# Patient Record
Sex: Female | Born: 1950
Health system: Southern US, Community
[De-identification: ages and names within clinical notes are randomized; demographics above are authoritative.]

## PROBLEM LIST (undated history)

## (undated) DIAGNOSIS — I10 Essential (primary) hypertension: Secondary | ICD-10-CM

## (undated) DIAGNOSIS — M869 Osteomyelitis, unspecified: Secondary | ICD-10-CM

## (undated) DIAGNOSIS — E11610 Type 2 diabetes mellitus with diabetic neuropathic arthropathy: Secondary | ICD-10-CM

## (undated) DIAGNOSIS — E113599 Type 2 diabetes mellitus with proliferative diabetic retinopathy without macular edema, unspecified eye: Secondary | ICD-10-CM

## (undated) DIAGNOSIS — L03116 Cellulitis of left lower limb: Secondary | ICD-10-CM

## (undated) DIAGNOSIS — K759 Inflammatory liver disease, unspecified: Secondary | ICD-10-CM

## (undated) DIAGNOSIS — E119 Type 2 diabetes mellitus without complications: Secondary | ICD-10-CM

## (undated) HISTORY — PX: TOE AMPUTATION: SHX809

## (undated) HISTORY — PX: CATARACT EXTRACTION W/ INTRAOCULAR LENS  IMPLANT, BILATERAL: SHX1307

## (undated) HISTORY — PX: FOOT SURGERY: SHX648

---

## 1971-12-23 HISTORY — PX: TUBAL LIGATION: SHX77

## 1985-12-22 HISTORY — PX: REDUCTION MAMMAPLASTY: SUR839

## 2002-12-22 HISTORY — PX: ABOVE KNEE LEG AMPUTATION: SUR20

## 2014-05-30 DIAGNOSIS — E7800 Pure hypercholesterolemia, unspecified: Secondary | ICD-10-CM | POA: Diagnosis not present

## 2014-05-30 DIAGNOSIS — E119 Type 2 diabetes mellitus without complications: Secondary | ICD-10-CM | POA: Diagnosis not present

## 2014-05-30 DIAGNOSIS — I10 Essential (primary) hypertension: Secondary | ICD-10-CM | POA: Diagnosis not present

## 2014-06-05 DIAGNOSIS — Z23 Encounter for immunization: Secondary | ICD-10-CM | POA: Diagnosis not present

## 2014-07-07 DIAGNOSIS — E1165 Type 2 diabetes mellitus with hyperglycemia: Secondary | ICD-10-CM | POA: Diagnosis not present

## 2014-07-07 DIAGNOSIS — E1139 Type 2 diabetes mellitus with other diabetic ophthalmic complication: Secondary | ICD-10-CM | POA: Diagnosis not present

## 2014-07-11 DIAGNOSIS — E1149 Type 2 diabetes mellitus with other diabetic neurological complication: Secondary | ICD-10-CM | POA: Diagnosis not present

## 2014-07-11 DIAGNOSIS — S88919A Complete traumatic amputation of unspecified lower leg, level unspecified, initial encounter: Secondary | ICD-10-CM | POA: Diagnosis not present

## 2014-07-11 DIAGNOSIS — I10 Essential (primary) hypertension: Secondary | ICD-10-CM | POA: Diagnosis not present

## 2014-07-11 DIAGNOSIS — E1142 Type 2 diabetes mellitus with diabetic polyneuropathy: Secondary | ICD-10-CM | POA: Diagnosis not present

## 2014-08-22 DIAGNOSIS — I10 Essential (primary) hypertension: Secondary | ICD-10-CM | POA: Diagnosis not present

## 2014-08-22 DIAGNOSIS — E7800 Pure hypercholesterolemia, unspecified: Secondary | ICD-10-CM | POA: Diagnosis not present

## 2014-08-22 DIAGNOSIS — E119 Type 2 diabetes mellitus without complications: Secondary | ICD-10-CM | POA: Diagnosis not present

## 2014-10-10 DIAGNOSIS — H18419 Arcus senilis, unspecified eye: Secondary | ICD-10-CM | POA: Diagnosis not present

## 2014-10-10 DIAGNOSIS — H11159 Pinguecula, unspecified eye: Secondary | ICD-10-CM | POA: Diagnosis not present

## 2014-10-10 DIAGNOSIS — H02409 Unspecified ptosis of unspecified eyelid: Secondary | ICD-10-CM | POA: Diagnosis not present

## 2014-10-10 DIAGNOSIS — E10319 Type 1 diabetes mellitus with unspecified diabetic retinopathy without macular edema: Secondary | ICD-10-CM | POA: Diagnosis not present

## 2014-10-18 DIAGNOSIS — Z899 Acquired absence of limb, unspecified: Secondary | ICD-10-CM | POA: Diagnosis not present

## 2014-10-18 DIAGNOSIS — I10 Essential (primary) hypertension: Secondary | ICD-10-CM | POA: Diagnosis not present

## 2014-10-18 DIAGNOSIS — E1140 Type 2 diabetes mellitus with diabetic neuropathy, unspecified: Secondary | ICD-10-CM | POA: Diagnosis not present

## 2014-10-18 DIAGNOSIS — E780 Pure hypercholesterolemia: Secondary | ICD-10-CM | POA: Diagnosis not present

## 2014-10-24 ENCOUNTER — Encounter (HOSPITAL_COMMUNITY): Payer: Self-pay | Admitting: Internal Medicine

## 2014-10-24 ENCOUNTER — Inpatient Hospital Stay (HOSPITAL_COMMUNITY)
Admission: AD | Admit: 2014-10-24 | Discharge: 2014-11-02 | DRG: 617 | Disposition: A | Discharge status: Skilled Nursing Facility | Payer: Medicare HMO | Source: Ambulatory Visit | Attending: Internal Medicine | Admitting: Internal Medicine

## 2014-10-24 ENCOUNTER — Inpatient Hospital Stay (HOSPITAL_COMMUNITY): Payer: Medicare HMO

## 2014-10-24 DIAGNOSIS — Z89511 Acquired absence of right leg below knee: Secondary | ICD-10-CM | POA: Diagnosis not present

## 2014-10-24 DIAGNOSIS — L0390 Cellulitis, unspecified: Secondary | ICD-10-CM

## 2014-10-24 DIAGNOSIS — L03116 Cellulitis of left lower limb: Secondary | ICD-10-CM | POA: Diagnosis not present

## 2014-10-24 DIAGNOSIS — E1143 Type 2 diabetes mellitus with diabetic autonomic (poly)neuropathy: Secondary | ICD-10-CM | POA: Diagnosis present

## 2014-10-24 DIAGNOSIS — L97422 Non-pressure chronic ulcer of left heel and midfoot with fat layer exposed: Secondary | ICD-10-CM | POA: Diagnosis present

## 2014-10-24 DIAGNOSIS — M79673 Pain in unspecified foot: Secondary | ICD-10-CM | POA: Diagnosis not present

## 2014-10-24 DIAGNOSIS — N179 Acute kidney failure, unspecified: Secondary | ICD-10-CM | POA: Diagnosis not present

## 2014-10-24 DIAGNOSIS — R109 Unspecified abdominal pain: Secondary | ICD-10-CM | POA: Diagnosis not present

## 2014-10-24 DIAGNOSIS — Z87891 Personal history of nicotine dependence: Secondary | ICD-10-CM

## 2014-10-24 DIAGNOSIS — S96009A Unspecified injury of muscle and tendon of long flexor muscle of toe at ankle and foot level, unspecified foot, initial encounter: Secondary | ICD-10-CM | POA: Diagnosis not present

## 2014-10-24 DIAGNOSIS — R112 Nausea with vomiting, unspecified: Secondary | ICD-10-CM | POA: Diagnosis not present

## 2014-10-24 DIAGNOSIS — E0840 Diabetes mellitus due to underlying condition with diabetic neuropathy, unspecified: Secondary | ICD-10-CM | POA: Diagnosis not present

## 2014-10-24 DIAGNOSIS — L02619 Cutaneous abscess of unspecified foot: Secondary | ICD-10-CM | POA: Diagnosis not present

## 2014-10-24 DIAGNOSIS — E0869 Diabetes mellitus due to underlying condition with other specified complication: Secondary | ICD-10-CM

## 2014-10-24 DIAGNOSIS — Z89611 Acquired absence of right leg above knee: Secondary | ICD-10-CM

## 2014-10-24 DIAGNOSIS — R1110 Vomiting, unspecified: Secondary | ICD-10-CM | POA: Diagnosis not present

## 2014-10-24 DIAGNOSIS — M86179 Other acute osteomyelitis, unspecified ankle and foot: Secondary | ICD-10-CM | POA: Diagnosis not present

## 2014-10-24 DIAGNOSIS — Z899 Acquired absence of limb, unspecified: Secondary | ICD-10-CM | POA: Diagnosis not present

## 2014-10-24 DIAGNOSIS — Z79899 Other long term (current) drug therapy: Secondary | ICD-10-CM | POA: Diagnosis not present

## 2014-10-24 DIAGNOSIS — IMO0001 Reserved for inherently not codable concepts without codable children: Secondary | ICD-10-CM

## 2014-10-24 DIAGNOSIS — K219 Gastro-esophageal reflux disease without esophagitis: Secondary | ICD-10-CM | POA: Diagnosis present

## 2014-10-24 DIAGNOSIS — E11621 Type 2 diabetes mellitus with foot ulcer: Principal | ICD-10-CM | POA: Diagnosis present

## 2014-10-24 DIAGNOSIS — Z794 Long term (current) use of insulin: Secondary | ICD-10-CM

## 2014-10-24 DIAGNOSIS — K3184 Gastroparesis: Secondary | ICD-10-CM | POA: Diagnosis present

## 2014-10-24 DIAGNOSIS — I10 Essential (primary) hypertension: Secondary | ICD-10-CM | POA: Diagnosis present

## 2014-10-24 DIAGNOSIS — E118 Type 2 diabetes mellitus with unspecified complications: Secondary | ICD-10-CM | POA: Insufficient documentation

## 2014-10-24 DIAGNOSIS — E11649 Type 2 diabetes mellitus with hypoglycemia without coma: Secondary | ICD-10-CM | POA: Diagnosis not present

## 2014-10-24 DIAGNOSIS — K5900 Constipation, unspecified: Secondary | ICD-10-CM | POA: Diagnosis not present

## 2014-10-24 DIAGNOSIS — M86672 Other chronic osteomyelitis, left ankle and foot: Secondary | ICD-10-CM | POA: Diagnosis present

## 2014-10-24 DIAGNOSIS — E119 Type 2 diabetes mellitus without complications: Secondary | ICD-10-CM | POA: Diagnosis present

## 2014-10-24 DIAGNOSIS — E780 Pure hypercholesterolemia: Secondary | ICD-10-CM | POA: Diagnosis not present

## 2014-10-24 DIAGNOSIS — E1142 Type 2 diabetes mellitus with diabetic polyneuropathy: Secondary | ICD-10-CM | POA: Diagnosis present

## 2014-10-24 DIAGNOSIS — E11610 Type 2 diabetes mellitus with diabetic neuropathic arthropathy: Secondary | ICD-10-CM

## 2014-10-24 DIAGNOSIS — L97529 Non-pressure chronic ulcer of other part of left foot with unspecified severity: Secondary | ICD-10-CM | POA: Diagnosis not present

## 2014-10-24 DIAGNOSIS — E1159 Type 2 diabetes mellitus with other circulatory complications: Secondary | ICD-10-CM | POA: Diagnosis present

## 2014-10-24 DIAGNOSIS — E0951 Drug or chemical induced diabetes mellitus with diabetic peripheral angiopathy without gangrene: Secondary | ICD-10-CM | POA: Diagnosis not present

## 2014-10-24 DIAGNOSIS — E1140 Type 2 diabetes mellitus with diabetic neuropathy, unspecified: Secondary | ICD-10-CM | POA: Diagnosis not present

## 2014-10-24 DIAGNOSIS — L03818 Cellulitis of other sites: Secondary | ICD-10-CM

## 2014-10-24 HISTORY — DX: Cellulitis of left lower limb: L03.116

## 2014-10-24 HISTORY — DX: Osteomyelitis, unspecified: M86.9

## 2014-10-24 HISTORY — DX: Type 2 diabetes mellitus with diabetic neuropathic arthropathy: E11.610

## 2014-10-24 HISTORY — DX: Type 2 diabetes mellitus without complications: E11.9

## 2014-10-24 LAB — COMPREHENSIVE METABOLIC PANEL
ALBUMIN: 2.8 g/dL — AB (ref 3.5–5.2)
AST: 9 U/L (ref 0–37)
Alkaline Phosphatase: 150 U/L — ABNORMAL HIGH (ref 39–117)
Anion gap: 17 — ABNORMAL HIGH (ref 5–15)
BUN: 17 mg/dL (ref 6–23)
CO2: 22 mEq/L (ref 19–32)
Calcium: 9.1 mg/dL (ref 8.4–10.5)
Chloride: 92 mEq/L — ABNORMAL LOW (ref 96–112)
Creatinine, Ser: 0.84 mg/dL (ref 0.50–1.10)
GFR calc Af Amer: 83 mL/min — ABNORMAL LOW (ref >90)
GFR calc non Af Amer: 71 mL/min — ABNORMAL LOW (ref >90)
Glucose, Bld: 440 mg/dL — ABNORMAL HIGH (ref 70–99)
POTASSIUM: 4.6 meq/L (ref 3.7–5.3)
SODIUM: 131 meq/L — AB (ref 137–147)
TOTAL PROTEIN: 7.5 g/dL (ref 6.0–8.3)
Total Bilirubin: 0.6 mg/dL (ref 0.3–1.2)

## 2014-10-24 LAB — CBC WITH DIFFERENTIAL/PLATELET
BASOS PCT: 0 % (ref 0–1)
Basophils Absolute: 0 10*3/uL (ref 0.0–0.1)
Eosinophils Absolute: 0 10*3/uL (ref 0.0–0.7)
Eosinophils Relative: 0 % (ref 0–5)
HEMATOCRIT: 36.8 % (ref 36.0–46.0)
HEMOGLOBIN: 12.3 g/dL (ref 12.0–15.0)
LYMPHS ABS: 1 10*3/uL (ref 0.7–4.0)
Lymphocytes Relative: 7 % — ABNORMAL LOW (ref 12–46)
MCH: 28 pg (ref 26.0–34.0)
MCHC: 33.4 g/dL (ref 30.0–36.0)
MCV: 83.6 fL (ref 78.0–100.0)
MONO ABS: 1.9 10*3/uL — AB (ref 0.1–1.0)
MONOS PCT: 13 % — AB (ref 3–12)
NEUTROS ABS: 12.1 10*3/uL — AB (ref 1.7–7.7)
Neutrophils Relative %: 80 % — ABNORMAL HIGH (ref 43–77)
Platelets: 319 10*3/uL (ref 150–400)
RBC: 4.4 MIL/uL (ref 3.87–5.11)
RDW: 12.5 % (ref 11.5–15.5)
WBC: 15.1 10*3/uL — ABNORMAL HIGH (ref 4.0–10.5)

## 2014-10-24 LAB — GLUCOSE, CAPILLARY
GLUCOSE-CAPILLARY: 236 mg/dL — AB (ref 70–99)
Glucose-Capillary: 374 mg/dL — ABNORMAL HIGH (ref 70–99)

## 2014-10-24 LAB — HEMOGLOBIN A1C
Hgb A1c MFr Bld: 8 % — ABNORMAL HIGH (ref <5.7)
Mean Plasma Glucose: 183 mg/dL — ABNORMAL HIGH (ref <117)

## 2014-10-24 MED ORDER — ONDANSETRON HCL 4 MG/2ML IJ SOLN
4.0000 mg | Freq: Four times a day (QID) | INTRAMUSCULAR | Status: DC | PRN
  Administered 2014-10-28 – 2014-10-30 (×4): 4 mg via INTRAVENOUS
  Filled 2014-10-24 (×2): qty 2

## 2014-10-24 MED ORDER — INSULIN GLARGINE 100 UNIT/ML ~~LOC~~ SOLN
30.0000 [IU] | Freq: Every day | SUBCUTANEOUS | Status: DC
  Administered 2014-10-24: 30 [IU] via SUBCUTANEOUS
  Filled 2014-10-24 (×2): qty 0.3

## 2014-10-24 MED ORDER — VANCOMYCIN HCL IN DEXTROSE 1-5 GM/200ML-% IV SOLN
1000.0000 mg | Freq: Once | INTRAVENOUS | Status: AC
  Administered 2014-10-24: 1000 mg via INTRAVENOUS
  Filled 2014-10-24: qty 200

## 2014-10-24 MED ORDER — VANCOMYCIN HCL IN DEXTROSE 1-5 GM/200ML-% IV SOLN
1000.0000 mg | Freq: Two times a day (BID) | INTRAVENOUS | Status: DC
  Administered 2014-10-25 – 2014-10-29 (×9): 1000 mg via INTRAVENOUS
  Filled 2014-10-24 (×12): qty 200

## 2014-10-24 MED ORDER — PNEUMOCOCCAL VAC POLYVALENT 25 MCG/0.5ML IJ INJ
0.5000 mL | INJECTION | INTRAMUSCULAR | Status: AC
  Administered 2014-10-26: 0.5 mL via INTRAMUSCULAR
  Filled 2014-10-24: qty 0.5

## 2014-10-24 MED ORDER — ENOXAPARIN SODIUM 40 MG/0.4ML ~~LOC~~ SOLN
40.0000 mg | SUBCUTANEOUS | Status: DC
  Administered 2014-10-24 – 2014-10-27 (×4): 40 mg via SUBCUTANEOUS
  Filled 2014-10-24 (×5): qty 0.4

## 2014-10-24 MED ORDER — PIPERACILLIN-TAZOBACTAM 3.375 G IVPB
3.3750 g | Freq: Three times a day (TID) | INTRAVENOUS | Status: DC
  Administered 2014-10-25 – 2014-10-29 (×15): 3.375 g via INTRAVENOUS
  Filled 2014-10-24 (×18): qty 50

## 2014-10-24 MED ORDER — ACETAMINOPHEN 325 MG PO TABS
650.0000 mg | ORAL_TABLET | Freq: Four times a day (QID) | ORAL | Status: DC | PRN
  Administered 2014-10-24 – 2014-10-29 (×4): 650 mg via ORAL
  Filled 2014-10-24 (×3): qty 2

## 2014-10-24 MED ORDER — INSULIN ASPART 100 UNIT/ML ~~LOC~~ SOLN
0.0000 [IU] | Freq: Every day | SUBCUTANEOUS | Status: DC
  Administered 2014-10-24 – 2014-10-25 (×2): 2 [IU] via SUBCUTANEOUS
  Administered 2014-10-26 – 2014-10-27 (×2): 3 [IU] via SUBCUTANEOUS
  Administered 2014-10-28 – 2014-10-29 (×2): 2 [IU] via SUBCUTANEOUS

## 2014-10-24 MED ORDER — ACETAMINOPHEN 650 MG RE SUPP
650.0000 mg | Freq: Four times a day (QID) | RECTAL | Status: DC | PRN
Start: 2014-10-24 — End: 2014-11-02

## 2014-10-24 MED ORDER — SODIUM CHLORIDE 0.9 % IV SOLN
INTRAVENOUS | Status: DC
  Administered 2014-10-24 – 2014-10-28 (×6): via INTRAVENOUS

## 2014-10-24 MED ORDER — PIPERACILLIN-TAZOBACTAM 3.375 G IVPB 30 MIN
3.3750 g | Freq: Once | INTRAVENOUS | Status: AC
  Administered 2014-10-24: 3.375 g via INTRAVENOUS
  Filled 2014-10-24: qty 50

## 2014-10-24 MED ORDER — INSULIN GLARGINE 300 UNIT/ML ~~LOC~~ SOPN: 30.0000 [IU] | PEN_INJECTOR | Freq: Every day | SUBCUTANEOUS | Status: DC

## 2014-10-24 MED ORDER — SODIUM CHLORIDE 0.9 % IV BOLUS (SEPSIS)
500.0000 mL | Freq: Once | INTRAVENOUS | Status: AC
  Administered 2014-10-24: 500 mL via INTRAVENOUS

## 2014-10-24 MED ORDER — FAMOTIDINE 20 MG PO TABS
20.0000 mg | ORAL_TABLET | Freq: Every day | ORAL | Status: DC
  Administered 2014-10-24 – 2014-11-02 (×7): 20 mg via ORAL
  Filled 2014-10-24 (×11): qty 1

## 2014-10-24 MED ORDER — ONDANSETRON HCL 4 MG PO TABS: 4.0000 mg | ORAL_TABLET | Freq: Four times a day (QID) | ORAL | Status: DC | PRN

## 2014-10-24 MED ORDER — INSULIN ASPART 100 UNIT/ML ~~LOC~~ SOLN
0.0000 [IU] | Freq: Three times a day (TID) | SUBCUTANEOUS | Status: DC
  Administered 2014-10-24: 15 [IU] via SUBCUTANEOUS
  Administered 2014-10-25 (×3): 5 [IU] via SUBCUTANEOUS
  Administered 2014-10-26: 3 [IU] via SUBCUTANEOUS
  Administered 2014-10-26 (×2): 5 [IU] via SUBCUTANEOUS
  Administered 2014-10-27 (×2): 3 [IU] via SUBCUTANEOUS
  Administered 2014-10-28: 2 [IU] via SUBCUTANEOUS
  Administered 2014-10-28: 3 [IU] via SUBCUTANEOUS
  Administered 2014-10-29: 2 [IU] via SUBCUTANEOUS
  Administered 2014-10-31: 5 [IU] via SUBCUTANEOUS

## 2014-10-24 NOTE — Progress Notes (Signed)
Patient arrived as direct admit. Attending on filed paged for orders, awaiting response. Will continue to monitor patient

## 2014-10-24 NOTE — Plan of Care (Signed)
Problem: Phase I Progression Outcomes Goal: Pain controlled with appropriate interventions Outcome: Progressing     

## 2014-10-24 NOTE — Progress Notes (Signed)
ANTIBIOTIC CONSULT NOTE - INITIAL  Pharmacy Consult:  Vancomycin / Zosyn Indication:  Cellulitis  Allergies  Allergen Reactions  . Clindamycin/Lincomycin Itching  . Codeine Itching    Patient Measurements: Height: 5\' 7"  (170.2 cm) Weight: 183 lb (83.008 kg) IBW/kg (Calculated) : 61.6  Vital Signs: Temp: 98.4 F (36.9 C) (11/03 1152) Temp Source: Oral (11/03 1152) BP: 133/68 mmHg (11/03 1152) Pulse Rate: 102 (11/03 1152)  Labs: No results for input(s): WBC, HGB, PLT, LABCREA, CREATININE in the last 72 hours. CrCl cannot be calculated (Patient has no serum creatinine result on file.). No results for input(s): VANCOTROUGH, VANCOPEAK, VANCORANDOM, GENTTROUGH, GENTPEAK, GENTRANDOM, TOBRATROUGH, TOBRAPEAK, TOBRARND, AMIKACINPEAK, AMIKACINTROU, AMIKACIN in the last 72 hours.   Microbiology: No results found for this or any previous visit (from the past 720 hour(s)).  Medical History: Past Medical History  Diagnosis Date  . Diabetes   . Charcot foot due to diabetes mellitus       Assessment: 31 YOF with history of right BKA due to osteomyelitis.  Pharmacy consulted to manage vancomycin and Zosyn for cellulitis.  First doses of antibiotics already ordered.  Baseline labs reviewed.   Goal of Therapy:  Vancomycin trough level 10-15 mcg/ml   Plan:  - Vanc 1gm IV Q12H - Zosyn 3.375gm IV Q8H, 4 hr infusion - Monitor renal fxn, clinical progress, vanc trough as indicated    Diana Coleman D. Mina Marble, PharmD, BCPS Pager:  [phone] 10/24/2014, 3:16 PM

## 2014-10-24 NOTE — H&P (Signed)
[facility] History and Physical  Diana Coleman ZOX:[id number] DOB: 27-Jan-1951 DOA: 10/24/2014  Referring physician: [facility] PCP: Elyn Peers, MD   Chief Complaint: ulcer on foot  HPI: Diana Coleman is a 63 y.o. female  Who moved from [place].  She is a diabetic that is currently better controlled then she was in the past.  She had a right BKA in 2004 after osteomyelitis of the foot. She presented to her doctors office after developing a wound on her left lateral foot (2x2).  Her appetite has been decreased, she has had fever.  About 1 week ago she got a new shoe for her foot deformed by charcot disease.   Patient has decreased sensation in her foot and LE.  Does not check for wounds daily.    Her PCP called to get her admitted to the hospital for further work up Labs/imaging studies pending  Review of Systems:  All systems reviewed, negative unless stated above   Past Medical History  Diagnosis Date  . Diabetes   . Charcot foot due to diabetes mellitus    Past Surgical History  Procedure Laterality Date  . Right aka    . Tubal ligation     Social History:  reports that she has quit smoking. She does not have any smokeless tobacco history on file. She reports that she does not drink alcohol or use illicit drugs.  Allergies  Allergen Reactions  . Clindamycin/Lincomycin Itching  . Codeine Itching    Family History  Problem Relation Age of Onset  . Uterine cancer Mother   . Gout Father   . Thyroid disease Sister      Prior to Admission medications   Medication Sig Start Date End Date Taking? Authorizing Provider  Dulaglutide (TRULICITY) 1.5 WJ/1.9JY SOPN Inject 1.5 mg into the skin once a week. On Wednesdays   Yes Historical Provider, MD  famotidine (PEPCID) 20 MG tablet Take 20 mg by mouth daily.  09/29/14  Yes Historical Provider, MD  ibuprofen (ADVIL,MOTRIN) 200 MG tablet Take 400 mg by mouth daily as needed (pain).   Yes Historical Provider, MD    Insulin Glargine (TOUJEO SOLOSTAR) 300 UNIT/ML SOPN Inject 30 Units into the skin at bedtime.   Yes Historical Provider, MD  LANTUS SOLOSTAR 100 UNIT/ML Solostar Pen  09/23/14   Historical Provider, MD  lisinopril (PRINIVIL,ZESTRIL) 5 MG tablet Take 5 mg by mouth daily.  09/23/14  Yes Historical Provider, MD  metFORMIN (GLUCOPHAGE) 1000 MG tablet Take 1,000 mg by mouth 2 (two) times daily with a meal.  09/23/14  Yes Historical Provider, MD  TRULICITY 1.5 NW/2.9FA SOPN Inject 1.5 mg into the skin once a week. On Wednesdays 09/20/14   Historical Provider, MD   Physical Exam: Filed Vitals:   10/24/14 1152  BP: 133/68  Pulse: 102  Temp: 98.4 F (36.9 C)  TempSrc: Oral  Resp: 20  SpO2: 97%    Wt Readings from Last 3 Encounters:  No data found for Wt    General:  Appears calm and comfortable Eyes: PERRL, normal lids, irises & conjunctiva ENT: grossly normal hearing, lips & tongue Neck: no LAD, masses or thyromegaly Cardiovascular: RRR, no m/r/g. No LE edema. Telemetry: SR, no arrhythmias  Respiratory: CTA bilaterally, no w/r/r. Normal respiratory effort. Abdomen: soft, ntnd Skin: no rash or induration seen on limited exam Musculoskeletal: right BKA, left foot lateral aspect with 2x2 yellow ulcer with white base.  No odor (she has charcot joint) Psychiatric: grossly normal  mood and affect, speech fluent and appropriate Neurologic: grossly non-focal.          Labs on Admission:  Basic Metabolic Panel: No results for input(s): NA, K, CL, CO2, GLUCOSE, BUN, CREATININE, CALCIUM, MG, PHOS in the last 168 hours. Liver Function Tests: No results for input(s): AST, ALT, ALKPHOS, BILITOT, PROT, ALBUMIN in the last 168 hours. No results for input(s): LIPASE, AMYLASE in the last 168 hours. No results for input(s): AMMONIA in the last 168 hours. CBC: No results for input(s): WBC, NEUTROABS, HGB, HCT, MCV, PLT in the last 168 hours. Cardiac Enzymes: No results for input(s): CKTOTAL, CKMB,  CKMBINDEX, TROPONINI in the last 168 hours.  BNP (last 3 results) No results for input(s): PROBNP in the last 8760 hours. CBG: No results for input(s): GLUCAP in the last 168 hours.  Radiological Exams on Admission: No results found.    Assessment/Plan Active Problems:   Cellulitis   Diabetes   Charcot foot due to diabetes mellitus   Cellulitis- x ray of left foot- patient may likely need MRI for further clarification, IV abx, may need ortho consult if not improved on IV abx   DM- check HgbA1C, SSI, lantus, diabetic diet  Charcot foot   Code Status: full DVT Prophylaxis: Family Communication: daughter at bedside Disposition Plan:   Time spent: 28 min  Eulogio Bear [facility] Pager [phone]

## 2014-10-24 NOTE — Plan of Care (Signed)
Problem: Phase I Progression Outcomes Goal: Pain controlled with appropriate interventions Outcome: Completed/Met Date Met:  10/24/14     

## 2014-10-25 DIAGNOSIS — I10 Essential (primary) hypertension: Secondary | ICD-10-CM | POA: Diagnosis present

## 2014-10-25 DIAGNOSIS — L089 Local infection of the skin and subcutaneous tissue, unspecified: Secondary | ICD-10-CM

## 2014-10-25 DIAGNOSIS — K219 Gastro-esophageal reflux disease without esophagitis: Secondary | ICD-10-CM | POA: Diagnosis present

## 2014-10-25 DIAGNOSIS — E1169 Type 2 diabetes mellitus with other specified complication: Secondary | ICD-10-CM

## 2014-10-25 DIAGNOSIS — E1159 Type 2 diabetes mellitus with other circulatory complications: Secondary | ICD-10-CM | POA: Diagnosis present

## 2014-10-25 LAB — CBC
HEMATOCRIT: 32.6 % — AB (ref 36.0–46.0)
Hemoglobin: 10.7 g/dL — ABNORMAL LOW (ref 12.0–15.0)
MCH: 27.3 pg (ref 26.0–34.0)
MCHC: 32.8 g/dL (ref 30.0–36.0)
MCV: 83.2 fL (ref 78.0–100.0)
Platelets: 287 10*3/uL (ref 150–400)
RBC: 3.92 MIL/uL (ref 3.87–5.11)
RDW: 12.5 % (ref 11.5–15.5)
WBC: 15.5 10*3/uL — ABNORMAL HIGH (ref 4.0–10.5)

## 2014-10-25 LAB — SEDIMENTATION RATE: Sed Rate: 92 mm/hr — ABNORMAL HIGH (ref 0–22)

## 2014-10-25 LAB — BASIC METABOLIC PANEL
ANION GAP: 13 (ref 5–15)
BUN: 15 mg/dL (ref 6–23)
CALCIUM: 8.7 mg/dL (ref 8.4–10.5)
CO2: 24 mEq/L (ref 19–32)
Chloride: 99 mEq/L (ref 96–112)
Creatinine, Ser: 0.91 mg/dL (ref 0.50–1.10)
GFR calc Af Amer: 75 mL/min — ABNORMAL LOW (ref >90)
GFR calc non Af Amer: 65 mL/min — ABNORMAL LOW (ref >90)
Glucose, Bld: 228 mg/dL — ABNORMAL HIGH (ref 70–99)
Potassium: 4 mEq/L (ref 3.7–5.3)
Sodium: 136 mEq/L — ABNORMAL LOW (ref 137–147)

## 2014-10-25 LAB — GLUCOSE, CAPILLARY
GLUCOSE-CAPILLARY: 230 mg/dL — AB (ref 70–99)
GLUCOSE-CAPILLARY: 215 mg/dL — AB (ref 70–99)
Glucose-Capillary: 223 mg/dL — ABNORMAL HIGH (ref 70–99)
Glucose-Capillary: 243 mg/dL — ABNORMAL HIGH (ref 70–99)

## 2014-10-25 LAB — HIV ANTIBODY (ROUTINE TESTING W REFLEX): HIV: NONREACTIVE

## 2014-10-25 LAB — C-REACTIVE PROTEIN: CRP: 28.6 mg/dL — AB (ref <0.60)

## 2014-10-25 MED ORDER — INSULIN GLARGINE 100 UNIT/ML ~~LOC~~ SOLN
36.0000 [IU] | Freq: Every day | SUBCUTANEOUS | Status: DC
  Administered 2014-10-25 – 2014-10-29 (×5): 36 [IU] via SUBCUTANEOUS
  Filled 2014-10-25 (×6): qty 0.36

## 2014-10-25 MED ORDER — PRO-STAT SUGAR FREE PO LIQD
30.0000 mL | Freq: Two times a day (BID) | ORAL | Status: DC
  Administered 2014-10-26 – 2014-10-28 (×6): 30 mL via ORAL
  Filled 2014-10-25 (×10): qty 30

## 2014-10-25 NOTE — Progress Notes (Signed)
Spoke with patient about diabetes and home regimen for diabetes control. Patient reports that she is followed by her PCP (Dr. Criss Rosales) for diabetes management and currently she takes Toujeo 30 units QHS, Trulicity 1.5 mg every week,  and Metformin 1000 mg BID as an outpatient for diabetes control. Patient states that she has been on Trulicity for about 2 months and Dr. Criss Rosales just changed her from Lantus to Toujeo this week at her last visit. Patient reports that she moved her from [place] and started seeing Dr. Criss Rosales in March of this year. Inquired about knowledge about A1C and patient reports that she knows what an A1C is and that her A1C in March was over 13%.  She reports that she is seeing Dr. Criss Rosales frequently to make changes with medication to improve her diabetes control.  Discussed A1C results (8.0% on 10/24/14) and explained what an A1C is, basic pathophysiology of DM Type 2, basic home care, importance of checking CBGs and maintaining good CBG control to prevent long-term and short-term complications. Patient reports that she checks her glucose twice a day and over the past few days it has been running high. Discussed impact of nutrition, exercise, stress, sickness, and medications on diabetes control. Discussed carbohydrates, carbohydrate goals per day and meal, along with portion sizes. Patient reports that since she started taking Trulicity she does not eat as much but she is still eating 3 small meals per day.  Praised patient for the improvements that she has made and encouraged her to continue to work with Dr. Criss Rosales to maintain diabetes control. Patient verbalized understanding of information discussed and she states that she has no further questions at this time related to diabetes.   Thanks, Barnie Alderman, RN, MSN, CCRN, CDE Diabetes Coordinator Inpatient Diabetes Program [phone] (Team Pager) [phone] ([facility] office) [phone] [facility] office)

## 2014-10-25 NOTE — Consult Note (Addendum)
WOC wound consult note Reason for Consult: Consult requested for left foot wound.  Pt states wound developed this week and site has been draining large amt pus. MRI and ABI results pending. Pt is well-informed regarding this type of wound since she previously lost her right foot in a similar manner, she states. Wound type: Full thickness Measurement: .2X.2X1cm; very narrow opening and unable to assess wound bed, swab inserted deeply indicates significant undermining beneath the surface and bone palpable. Drainage (amount, consistency, odor)  Mod amt thick tan drainage, no odor Periwound: Visible Charcot foot deformity, generalized erythremia and edema surrounding wound. Dressing procedure/placement/frequency: Foam dressing to protect and absorb drainage until further input available. Topical treatment will not be effective; this is beyond [facility] scope of practice. Recommend ortho consult R/T bone palpable to left foot wound, pus draining, and pt has extensive Charcot foot changes.  Please re-consult if further assistance is needed.  Thank-you,  Julien Girt MSN, [place], [place], [place], [place]

## 2014-10-25 NOTE — Progress Notes (Signed)
PROGRESS NOTE  Diana Coleman UQJ:[id number] DOB: 08-Sep-1951 DOA: 10/24/2014 PCP: Elyn Peers, MD  HPI/Subjective: 63 year old AA diabetic female presented from her doctors office after they found a 2x2 foot ulcer on her left lateral foot. The foot is deformed by charcot disease. The patient states that she recently got a pair of new shoes which were made specifically for her charcot disease. She has decreased sensation in her Left foot and LE. She states that she does try to check her foot every day with the assistance of a mirror. She has a BKA of her RE from osteomyelitis back in 2004. The patient states that her blood sugar is currently better controlled than it was in the past. Since presenting to the hospital her appetite has decreased and she developed a fever.   Assessment/Plan: Left Diabetic Foot- Secondary underlying Diabetic Charcot's foot. White count 15.5 11/4. DG foot shows Soft tissue swelling without definite acute bony abnormality. MR of left foot ordered to rule out bone osteomylitis. Dr. Sharol Given to see the patient today. Patient started on Vancomycin and Zosyn on 11/4.  Diabetes-  Not well controled. Increase Lantus to 36 units, continue with Sliding scale. A1C 8.0 HTN-controlled, without any antihypertensive. Resume Lisinopril when able GERD:PPI S/p Right AKA-in 2004  DVT Prophylaxis:  Lovenox   Code Status: Full Family Communication: None. Patient is AAOX3 and agreeable to plan of care Disposition Plan: Home or to rehab when medically appropriate.   Consultants:  Dr. Sharol Given  Procedures:  MR L Foot  Antibiotics:  Vancomycin 1,000 mg IV at 200 mL/hr Q12. 11/3>>  Zosyn 3.375 g IV at 12.5 mL/hr Q8. 11/3>>  Objective: Filed Vitals:   10/24/14 1458 10/24/14 1648 10/24/14 2135 10/25/14 0507  BP: 151/58 135/55 126/72 114/63  Pulse: 105 108 96 83  Temp: 102.5 F (39.2 C) 101.6 F (38.7 C) 100.4 F (38 C) 98 F (36.7 C)  TempSrc: Oral Oral Oral Oral  Resp:  18  17 16   Height:      Weight:      SpO2: 98% 96% 97% 98%    Intake/Output Summary (Last 24 hours) at 10/25/14 0936 Last data filed at 10/25/14 0900  Gross per 24 hour  Intake   2120 ml  Output      1 ml  Net   2119 ml   Filed Weights   10/24/14 1300  Weight: 83.008 kg (183 lb)    Exam: General: Patient is sitting in wheelchair, NAD, appears stated age  18:  EOMI, Anicteic Sclera, MMM.  Neck: Supple, no JVD, no masses  Cardiovascular: RRR, S1 S2 auscultated, no rubs, murmurs or gallops.   Respiratory: Clear to auscultation bilaterally with equal chest rise  Abdomen: Soft, nontender, nondistended, + bowel sounds  Extremities: RKA well healed. Left foot with 2x2 cm ulcer, erythematic and edematous.   Psych: Normal affect and demeanor with intact judgement and insight   Data Reviewed: Basic Metabolic Panel:  Recent Labs Lab 10/24/14 1400 10/25/14 0430  NA 131* 136*  K 4.6 4.0  CL 92* 99  CO2 22 24  GLUCOSE 440* 228*  BUN 17 15  CREATININE 0.84 0.91  CALCIUM 9.1 8.7   Liver Function Tests:  Recent Labs Lab 10/24/14 1400  AST 9  ALT <5  ALKPHOS 150*  BILITOT 0.6  PROT 7.5  ALBUMIN 2.8*  CBC:  Recent Labs Lab 10/24/14 1400 10/25/14 0430  WBC 15.1* 15.5*  NEUTROABS 12.1*  --   HGB 12.3  10.7*  HCT 36.8 32.6*  MCV 83.6 83.2  PLT 319 287    CBG:  Recent Labs Lab 10/24/14 1830 10/24/14 2137 10/25/14 0742  GLUCAP 374* 236* 223*    Recent Results (from the past 240 hour(s))  Culture, blood (routine x 2)     Status: None (Preliminary result)   Collection Time: 10/24/14  3:35 PM  Result Value Ref Range Status   Specimen Description BLOOD LEFT HAND  Final   Special Requests BOTTLES DRAWN AEROBIC ONLY 10CC  Final   Culture  Setup Time   Final    10/24/2014 21:28 Performed at [facility]    Culture   Final           BLOOD CULTURE RECEIVED NO GROWTH TO DATE CULTURE WILL BE HELD FOR 5 DAYS BEFORE ISSUING A FINAL NEGATIVE  REPORT Performed at [facility]    Report Status PENDING  Incomplete  Culture, blood (routine x 2)     Status: None (Preliminary result)   Collection Time: 10/24/14  3:45 PM  Result Value Ref Range Status   Specimen Description BLOOD RIGHT ARM  Final   Special Requests BOTTLES DRAWN AEROBIC ONLY 8CC  Final   Culture  Setup Time   Final    10/24/2014 21:28 Performed at [facility]    Culture   Final           BLOOD CULTURE RECEIVED NO GROWTH TO DATE CULTURE WILL BE HELD FOR 5 DAYS BEFORE ISSUING A FINAL NEGATIVE REPORT Performed at [facility]    Report Status PENDING  Incomplete     Studies: Dg Foot Complete Left  10/24/2014   CLINICAL DATA:  63 year old female with left foot ulcer and swelling for 5 days. History of diabetes intraconal foot.  EXAM: LEFT FOOT - COMPLETE 3+ VIEW  COMPARISON:  None.  FINDINGS: Extensive deformity within the midfoot and hindfoot noted compatible with Charcot type changes.  Surgical screws within the second toe proximal phalanx and first metatarsal noted.  Soft tissue swelling is present.  No definite radiographic evidence of osteomyelitis or acute bony injury noted.  Flattening of the foot is present.  IMPRESSION: Soft tissue swelling without definite acute bony abnormality or radiographic evidence of osteomyelitis.  Extensive deformity within the midfoot and hindfoot compatible with Charcot type changes.  If there is strong clinical concern for acute osteomyelitis, consider MRI.   Electronically Signed   By: Hassan Rowan M.D.   On: 10/24/2014 16:27    Scheduled Meds: . enoxaparin (LOVENOX) injection  40 mg Subcutaneous Q24H  . famotidine  20 mg Oral Daily  . insulin aspart  0-15 Units Subcutaneous TID WC  . insulin aspart  0-5 Units Subcutaneous QHS  . insulin glargine  30 Units Subcutaneous QHS  . piperacillin-tazobactam (ZOSYN)  IV  3.375 g Intravenous Q8H  . pneumococcal 23 valent vaccine  0.5 mL Intramuscular Tomorrow-1000  .  vancomycin  1,000 mg Intravenous Q12H   Continuous Infusions: . sodium chloride 100 mL/hr at 10/25/14 0503    Active Problems:   Cellulitis   Diabetes   Charcot foot due to diabetes mellitus  Colbert Ewing PA-S [facility] Pager [phone]. If 7PM-7AM, please contact night-coverage at [email or web address], password [facility] 10/25/2014, 9:36 AM  LOS: 1 day    Attending Patient was seen, examined,treatment plan was discussed with the  Advance Practice Provider.  I have directly reviewed the clinical findings, lab, imaging studies and management of this patient in  detail. I have made the necessary changes to the above noted documentation, and agree with the documentation, as recorded by the Advance Practice Provider.   Admitted with Diabetic Foot infection,await MRI-Dr Sharol Given consulted. Continue IV Abx, rest as above  Nena Alexander MD [facility].

## 2014-10-25 NOTE — Progress Notes (Addendum)
INITIAL NUTRITION ASSESSMENT  DOCUMENTATION CODES Per approved criteria  -Obesity Unspecified   INTERVENTION: 30 ml Prostat BID, which will provide 200 kcals and 30 grams protein  NUTRITION DIAGNOSIS: Increased nutrient needs related to wound healing as evidenced by estimated nutritional needs, charcot infection.   Goal: Pt will meet >90% of estimated nutritional needs  Monitor:  PO/supplement intake, labs, weight changes, I/O's  Reason for Assessment: MST=2, Consult for wound healing  63 y.o. female  Admitting Dx: <principal problem not specified>  Diana Coleman is a 63 y.o. female who moved from [place]. She is a diabetic that is currently better controlled then she was in the past. She had a right BKA in 2004 after osteomyelitis of the foot. She presented to her doctors office after developing a wound on her left lateral foot (2x2). Her appetite has been decreased, she has had fever. About 1 week ago she got a new shoe for her foot deformed by charcot disease.   ASSESSMENT: Pt reports that appetite is very good at baseline, however, was poor for the past week due to infection. She reports her appetite has returned. She completed 100% of her breakfast this morning.  She estimates that she has lost 8# (4.2%) in the past week as a result of infection and decreased appetite. She endorses UBW of 191#.  Nutrition focused physical exam revealed no signs of fat and muscle depletion. She admits that glycemic control has always been a struggle for her. She reports she has been very conscientious about better control over the past year. She reveals that there have been insulin adjustments and she was recently started on Tradjenta, which has helped immensely. She also reveals she was recently started on an appetite suppressant. Shared Hgb A1c reading with her (8.0 on 10/24/14); reveals reading is 13 at baseline, so improvements have been made.  Diet recall reveals that pt has been  consuming a healthy diet and pt admits to eating more vegetables and salads. Reviewed her menu requests with Corliss Marcus- she is choosing foods according to her diet prescription. Praised her for lifestyle changes and provided encouragement to continue.  Labs reviewed. Na: 136, Glucose: 228. CBGS: 223-374.   Height: Ht Readings from Last 1 Encounters:  10/24/14 5\' 7"  (1.702 m)    Weight: Wt Readings from Last 1 Encounters:  10/24/14 183 lb (83.008 kg)    Ideal Body Weight: 136#  % Ideal Body Weight: 135%  Wt Readings from Last 10 Encounters:  10/24/14 183 lb (83.008 kg)    Usual Body Weight: 191#  % Usual Body Weight: 96%  BMI: Body mass index is 30.65 (adjusted for rt BKA). Obesity, class I  Estimated Nutritional Needs: Kcal: 2000-2200 Protein: 94-104 grams Fluid: 2.0-2.2 L  Skin: rt BKA, lt charcot foot infection  Diet Order: Diet Carb Modified  EDUCATION NEEDS: -Education needs addressed   Intake/Output Summary (Last 24 hours) at 10/25/14 1207 Last data filed at 10/25/14 0900  Gross per 24 hour  Intake   2120 ml  Output      1 ml  Net   2119 ml    Last BM: 10/24/14  Labs:   Recent Labs Lab 10/24/14 1400 10/25/14 0430  NA 131* 136*  K 4.6 4.0  CL 92* 99  CO2 22 24  BUN 17 15  CREATININE 0.84 0.91  CALCIUM 9.1 8.7  GLUCOSE 440* 228*    CBG (last 3)   Recent Labs  10/24/14 1830 10/24/14 2137 10/25/14 [place]  374* 236* 223*   Lab Results  Component Value Date   HGBA1C 8.0* 10/24/2014   Scheduled Meds: . enoxaparin (LOVENOX) injection  40 mg Subcutaneous Q24H  . famotidine  20 mg Oral Daily  . insulin aspart  0-15 Units Subcutaneous TID WC  . insulin aspart  0-5 Units Subcutaneous QHS  . insulin glargine  36 Units Subcutaneous QHS  . piperacillin-tazobactam (ZOSYN)  IV  3.375 g Intravenous Q8H  . pneumococcal 23 valent vaccine  0.5 mL Intramuscular Tomorrow-1000  . vancomycin  1,000 mg Intravenous Q12H    Continuous  Infusions: . sodium chloride 100 mL/hr at 10/25/14 0503    Past Medical History  Diagnosis Date  . Type II diabetes mellitus   . Charcot foot due to diabetes mellitus   . Cellulitis of foot, left 10/24/2014    hx/notes 10/24/2014  . Osteomyelitis of right foot     hx/notes 10/24/2014    Past Surgical History  Procedure Laterality Date  . Above knee leg amputation Right 2004  . Tubal ligation  1973  . Reduction mammaplasty Bilateral 1987  . Foot surgery Left 1980's    "ulcer removed"  . Toe amputation Left ~ 2011    "top of my 3rd toe"    Basia Mcginty A. Jimmye Norman, RD, LDN Pager: [phone] After hours Pager: [phone]

## 2014-10-26 DIAGNOSIS — E11621 Type 2 diabetes mellitus with foot ulcer: Secondary | ICD-10-CM | POA: Diagnosis not present

## 2014-10-26 DIAGNOSIS — L97409 Non-pressure chronic ulcer of unspecified heel and midfoot with unspecified severity: Secondary | ICD-10-CM

## 2014-10-26 LAB — CBC
HEMATOCRIT: 32.2 % — AB (ref 36.0–46.0)
Hemoglobin: 10.5 g/dL — ABNORMAL LOW (ref 12.0–15.0)
MCH: 27.9 pg (ref 26.0–34.0)
MCHC: 32.6 g/dL (ref 30.0–36.0)
MCV: 85.4 fL (ref 78.0–100.0)
Platelets: 308 10*3/uL (ref 150–400)
RBC: 3.77 MIL/uL — ABNORMAL LOW (ref 3.87–5.11)
RDW: 12.8 % (ref 11.5–15.5)
WBC: 14.7 10*3/uL — AB (ref 4.0–10.5)

## 2014-10-26 LAB — BASIC METABOLIC PANEL
Anion gap: 11 (ref 5–15)
BUN: 9 mg/dL (ref 6–23)
CHLORIDE: 107 meq/L (ref 96–112)
CO2: 25 mEq/L (ref 19–32)
Calcium: 8.8 mg/dL (ref 8.4–10.5)
Creatinine, Ser: 0.9 mg/dL (ref 0.50–1.10)
GFR calc Af Amer: 76 mL/min — ABNORMAL LOW (ref >90)
GFR calc non Af Amer: 66 mL/min — ABNORMAL LOW (ref >90)
GLUCOSE: 129 mg/dL — AB (ref 70–99)
POTASSIUM: 3.8 meq/L (ref 3.7–5.3)
Sodium: 143 mEq/L (ref 137–147)

## 2014-10-26 LAB — GLUCOSE, CAPILLARY
GLUCOSE-CAPILLARY: 116 mg/dL — AB (ref 70–99)
Glucose-Capillary: 170 mg/dL — ABNORMAL HIGH (ref 70–99)
Glucose-Capillary: 231 mg/dL — ABNORMAL HIGH (ref 70–99)
Glucose-Capillary: 249 mg/dL — ABNORMAL HIGH (ref 70–99)
Glucose-Capillary: 267 mg/dL — ABNORMAL HIGH (ref 70–99)

## 2014-10-26 NOTE — Progress Notes (Signed)
Patient ID: Diana Coleman, female   DOB: 1951/11/12, 63 y.o.   MRN: [id number] Patient has a Charcot collapse of the left foot. She has a plantar ulcer with redness. Radiographs shows no definite osteomyelitis by plain films. The MRI scan is still pending. She has a stable right transtibial amputation. I will evaluate once the MRI scan is completed.

## 2014-10-26 NOTE — Progress Notes (Signed)
VASCULAR LAB PRELIMINARY  ARTERIAL  ABI completed:    RIGHT    LEFT    PRESSURE WAVEFORM  PRESSURE WAVEFORM  BRACHIAL 167 Triphasic BRACHIAL 161 Triphasic  DP  BKA DP Noncompressible Triphasic  AT  BKA AT    PT  BKA PT 177 Triphasic  PER  BKA PER    GREAT TOE  NA GREAT TOE 105 NA    RIGHT LEFT  ABI BKA 1.06   The left ABI is within normal limits. The left great toe pressure is suggestive of adequate perfusion.  10/26/2014 5:30 PM Maudry Mayhew, RVT, RDCS, RDMS

## 2014-10-26 NOTE — Progress Notes (Signed)
PATIENT DETAILS Name: Diana Coleman Age: 63 y.o. Sex: female Date of Birth: 31-Aug-1951 Admit Date: 10/24/2014 Admitting Physician Geradine Girt, DO ONG:EXBMW,UXLKG J, MD  Subjective: No major complaints  Assessment/Plan: Active Problems:   Left Diabetic Foot- Secondary underlying Diabetic Charcot's foot. Better, less leukocytosis. Continue empiric Abx. Await MRI. Wound care/Ortho following.   Diabetes:better controlled, continue Lantus 36 units and SSI. Continue to hold oral hypoglycemics.  MWN:[id number], without any antihypertensive. Resume Lisinopril when able  GERD:PPI  S/p Right AKA-in 2004  Disposition: Remain inpatient  Antibiotics:  IV Vanco 11/3  IV Zosyn 11/3  DVT Prophylaxis: Prophylactic Lovenox   Code Status: Full code  Family Communication None at bedside, patient understands treatment plan  Procedures:  None  CONSULTS:  orthopedic surgery  Time spent 40 minutes-which includes 50% of the time with face-to-face with patient/ family and coordinating care related to the above assessment and plan.    MEDICATIONS: Scheduled Meds: . enoxaparin (LOVENOX) injection  40 mg Subcutaneous Q24H  . famotidine  20 mg Oral Daily  . feeding supplement (PRO-STAT SUGAR FREE 64)  30 mL Oral BID BM  . insulin aspart  0-15 Units Subcutaneous TID WC  . insulin aspart  0-5 Units Subcutaneous QHS  . insulin glargine  36 Units Subcutaneous QHS  . piperacillin-tazobactam (ZOSYN)  IV  3.375 g Intravenous Q8H  . vancomycin  1,000 mg Intravenous Q12H   Continuous Infusions: . sodium chloride 100 mL/hr at 10/26/14 1100   PRN Meds:.acetaminophen **OR** acetaminophen, ondansetron **OR** ondansetron (ZOFRAN) IV  Antibiotics: Anti-infectives    Start     Dose/Rate Route Frequency Ordered Stop   10/25/14 0500  vancomycin (VANCOCIN) IVPB 1000 mg/200 mL premix     1,000 mg200 mL/hr over 60 Minutes Intravenous Every 12 hours 10/24/14 1517     10/25/14 0000   piperacillin-tazobactam (ZOSYN) IVPB 3.375 g     3.375 g12.5 mL/hr over 240 Minutes Intravenous Every 8 hours 10/24/14 1517     10/24/14 1300  piperacillin-tazobactam (ZOSYN) IVPB 3.375 g     3.375 g100 mL/hr over 30 Minutes Intravenous  Once 10/24/14 1256 10/24/14 1718   10/24/14 1300  vancomycin (VANCOCIN) IVPB 1000 mg/200 mL premix     1,000 mg200 mL/hr over 60 Minutes Intravenous  Once 10/24/14 1256 10/24/14 1939       PHYSICAL EXAM: Vital signs in last 24 hours: Filed Vitals:   10/25/14 2202 10/26/14 0500 10/26/14 0958 10/26/14 1100  BP: 134/75 133/72 123/63   Pulse: 84 82 83   Temp: 99.2 F (37.3 C) 98.5 F (36.9 C)  98 F (36.7 C)  TempSrc: Oral     Resp: 18 18 16    Height:      Weight:      SpO2: 97% 95% 98%     Weight change:  Filed Weights   10/24/14 1300  Weight: 83.008 kg (183 lb)   Body mass index is 28.66 kg/(m^2).   Gen Exam: Awake and alert with clear speech.   Neck: Supple, No JVD.   Chest: B/L Clear.   CVS: S1 S2 Regular, no murmurs.  Abdomen: soft, BS +, non tender, non distended.  Extremities: Left foot with 2x2 cm ulcer, erythematic and edematous. s/p Right AKA Neurologic: Non Focal.   Skin: No Rash.    Intake/Output from previous day:  Intake/Output Summary (Last 24 hours) at 10/26/14 1225 Last data filed at 10/26/14 0900  Gross per 24 hour  Intake  2137 ml  Output      1 ml  Net   2136 ml     LAB RESULTS: CBC  Recent Labs Lab 10/24/14 1400 10/25/14 0430 10/26/14 0400  WBC 15.1* 15.5* 14.7*  HGB 12.3 10.7* 10.5*  HCT 36.8 32.6* 32.2*  PLT 319 287 308  MCV 83.6 83.2 85.4  MCH 28.0 27.3 27.9  MCHC 33.4 32.8 32.6  RDW 12.5 12.5 12.8  LYMPHSABS 1.0  --   --   MONOABS 1.9*  --   --   EOSABS 0.0  --   --   BASOSABS 0.0  --   --     Chemistries   Recent Labs Lab 10/24/14 1400 10/25/14 0430 10/26/14 0400  NA 131* 136* 143  K 4.6 4.0 3.8  CL 92* 99 107  CO2 22 24 25   GLUCOSE 440* 228* 129*  BUN 17 15 9   CREATININE  0.84 0.91 0.90  CALCIUM 9.1 8.7 8.8    CBG:  Recent Labs Lab 10/25/14 1310 10/25/14 1726 10/25/14 2202 10/26/14 0420 10/26/14 0738  GLUCAP 230* 243* 215* 116* 170*    GFR Estimated Creatinine Clearance: 69.1 mL/min (by C-G formula based on Cr of 0.9).  Coagulation profile No results for input(s): INR, PROTIME in the last 168 hours.  Cardiac Enzymes No results for input(s): CKMB, TROPONINI, MYOGLOBIN in the last 168 hours.  Invalid input(s): CK  Invalid input(s): POCBNP No results for input(s): DDIMER in the last 72 hours.  Recent Labs  10/24/14 1400  HGBA1C 8.0*   No results for input(s): CHOL, HDL, LDLCALC, TRIG, CHOLHDL, LDLDIRECT in the last 72 hours. No results for input(s): TSH, T4TOTAL, T3FREE, THYROIDAB in the last 72 hours.  Invalid input(s): FREET3 No results for input(s): VITAMINB12, FOLATE, FERRITIN, TIBC, IRON, RETICCTPCT in the last 72 hours. No results for input(s): LIPASE, AMYLASE in the last 72 hours.  Urine Studies No results for input(s): UHGB, CRYS in the last 72 hours.  Invalid input(s): UACOL, UAPR, USPG, UPH, UTP, UGL, UKET, UBIL, UNIT, UROB, ULEU, UEPI, UWBC, URBC, UBAC, CAST, UCOM, BILUA  MICROBIOLOGY: Recent Results (from the past 240 hour(s))  Culture, blood (routine x 2)     Status: None (Preliminary result)   Collection Time: 10/24/14  3:35 PM  Result Value Ref Range Status   Specimen Description BLOOD LEFT HAND  Final   Special Requests BOTTLES DRAWN AEROBIC ONLY 10CC  Final   Culture  Setup Time   Final    10/24/2014 21:28 Performed at [facility]    Culture   Final           BLOOD CULTURE RECEIVED NO GROWTH TO DATE CULTURE WILL BE HELD FOR 5 DAYS BEFORE ISSUING A FINAL NEGATIVE REPORT Performed at [facility]    Report Status PENDING  Incomplete  Culture, blood (routine x 2)     Status: None (Preliminary result)   Collection Time: 10/24/14  3:45 PM  Result Value Ref Range Status   Specimen Description  BLOOD RIGHT ARM  Final   Special Requests BOTTLES DRAWN AEROBIC ONLY [place]  Final   Culture  Setup Time   Final    10/24/2014 21:28 Performed at [facility]    Culture   Final           BLOOD CULTURE RECEIVED NO GROWTH TO DATE CULTURE WILL BE HELD FOR 5 DAYS BEFORE ISSUING A FINAL NEGATIVE REPORT Performed at [facility]    Report Status  PENDING  Incomplete    RADIOLOGY STUDIES/RESULTS: Dg Foot Complete Left  10/24/2014   CLINICAL DATA:  63 year old female with left foot ulcer and swelling for 5 days. History of diabetes intraconal foot.  EXAM: LEFT FOOT - COMPLETE 3+ VIEW  COMPARISON:  None.  FINDINGS: Extensive deformity within the midfoot and hindfoot noted compatible with Charcot type changes.  Surgical screws within the second toe proximal phalanx and first metatarsal noted.  Soft tissue swelling is present.  No definite radiographic evidence of osteomyelitis or acute bony injury noted.  Flattening of the foot is present.  IMPRESSION: Soft tissue swelling without definite acute bony abnormality or radiographic evidence of osteomyelitis.  Extensive deformity within the midfoot and hindfoot compatible with Charcot type changes.  If there is strong clinical concern for acute osteomyelitis, consider MRI.   Electronically Signed   By: Hassan Rowan M.D.   On: 10/24/2014 16:27    Oren Binet, MD  [facility] Pager:[phone]  If 7PM-7AM, please contact night-coverage [email or web address] Password [email or web address] 10/26/2014, 12:25 PM   LOS: 2 days

## 2014-10-27 ENCOUNTER — Inpatient Hospital Stay (HOSPITAL_COMMUNITY): Payer: Medicare HMO

## 2014-10-27 DIAGNOSIS — L97509 Non-pressure chronic ulcer of other part of unspecified foot with unspecified severity: Secondary | ICD-10-CM

## 2014-10-27 LAB — GLUCOSE, CAPILLARY
GLUCOSE-CAPILLARY: 208 mg/dL — AB (ref 70–99)
GLUCOSE-CAPILLARY: 193 mg/dL — AB (ref 70–99)
Glucose-Capillary: 167 mg/dL — ABNORMAL HIGH (ref 70–99)
Glucose-Capillary: 259 mg/dL — ABNORMAL HIGH (ref 70–99)

## 2014-10-27 LAB — CBC
HEMATOCRIT: 29.6 % — AB (ref 36.0–46.0)
HEMOGLOBIN: 9.7 g/dL — AB (ref 12.0–15.0)
MCH: 27.6 pg (ref 26.0–34.0)
MCHC: 32.8 g/dL (ref 30.0–36.0)
MCV: 84.3 fL (ref 78.0–100.0)
PLATELETS: 367 10*3/uL (ref 150–400)
RBC: 3.51 MIL/uL — AB (ref 3.87–5.11)
RDW: 12.9 % (ref 11.5–15.5)
WBC: 13.6 10*3/uL — AB (ref 4.0–10.5)

## 2014-10-27 LAB — VANCOMYCIN, TROUGH: VANCOMYCIN TR: 15.8 ug/mL (ref 10.0–20.0)

## 2014-10-27 MED ORDER — GADOBENATE DIMEGLUMINE 529 MG/ML IV SOLN
18.0000 mL | Freq: Once | INTRAVENOUS | Status: AC | PRN
  Administered 2014-10-27: 18 mL via INTRAVENOUS

## 2014-10-27 NOTE — Care Management Note (Signed)
  Page 1 of 1   11/02/2014     3:08:05 PM CARE MANAGEMENT NOTE 11/02/2014  Patient:  ROSELIA, SNIPE A   Account Number:  [id number]  Date Initiated:  10/27/2014  Documentation initiated by:  Magdalen Spatz  Subjective/Objective Assessment:     Action/Plan:   Anticipated DC Date:  11/03/2014   Anticipated DC Plan:  [place]  In-house referral  Clinical Social Worker         Choice offered to / List presented to:  C-1 Patient      DME agency  [facility].        Status of service:   Medicare Important Message given?  YES (If response is "NO", the following Medicare IM given date fields will be blank) Date Medicare IM given:  11/02/2014 Medicare IM given by:  Magdalen Spatz Date Additional Medicare IM given:  10/30/2014 Additional Medicare IM given by:  Magdalen Spatz  Discharge Disposition:    Per UR Regulation:    If discussed at [place] Length of Stay Meetings, dates discussed:   10/31/2014  11/02/2014    Comments:

## 2014-10-27 NOTE — Progress Notes (Signed)
PATIENT DETAILS Name: Diana Coleman Age: 63 y.o. Sex: female Date of Birth: August 02, 1951 Admit Date: 10/24/2014 Admitting Physician Geradine Girt, DO DDU:KGURK,YHCWC J, MD  Subjective: No major complaints-still awaiting MRI (ordered 11/4)  Assessment/Plan: Active Problems:   Left Diabetic Foot- Secondary underlying Diabetic Charcot's foot. Better, leukocytosis decreasing. Continue empiric Abx. Await MRI. Wound care/Ortho following. Blood cultures on 11/3 negative so far.  Diabetes:better controlled, continue Lantus 36 units and SSI. Continue to hold oral hypoglycemics.  BJS:[id number], without any antihypertensive. Resume Lisinopril when able  GERD:PPI  S/p Right AKA-in 2004  Disposition: Remain inpatient-Await MRI   Antibiotics:  IV Vanco 11/3>>  IV Zosyn 11/3>>  DVT Prophylaxis: Prophylactic Lovenox   Code Status: Full code  Family Communication None at bedside, patient understands treatment plan  Procedures:  None  CONSULTS:  orthopedic surgery  MEDICATIONS: Scheduled Meds: . enoxaparin (LOVENOX) injection  40 mg Subcutaneous Q24H  . famotidine  20 mg Oral Daily  . feeding supplement (PRO-STAT SUGAR FREE 64)  30 mL Oral BID BM  . insulin aspart  0-15 Units Subcutaneous TID WC  . insulin aspart  0-5 Units Subcutaneous QHS  . insulin glargine  36 Units Subcutaneous QHS  . piperacillin-tazobactam (ZOSYN)  IV  3.375 g Intravenous Q8H  . vancomycin  1,000 mg Intravenous Q12H   Continuous Infusions: . sodium chloride 100 mL/hr at 10/27/14 0046   PRN Meds:.acetaminophen **OR** acetaminophen, ondansetron **OR** ondansetron (ZOFRAN) IV  Antibiotics: Anti-infectives    Start     Dose/Rate Route Frequency Ordered Stop   10/25/14 0500  vancomycin (VANCOCIN) IVPB 1000 mg/200 mL premix     1,000 mg200 mL/hr over 60 Minutes Intravenous Every 12 hours 10/24/14 1517     10/25/14 0000  piperacillin-tazobactam (ZOSYN) IVPB 3.375 g     3.375 g12.5 mL/hr  over 240 Minutes Intravenous Every 8 hours 10/24/14 1517     10/24/14 1300  piperacillin-tazobactam (ZOSYN) IVPB 3.375 g     3.375 g100 mL/hr over 30 Minutes Intravenous  Once 10/24/14 1256 10/24/14 1718   10/24/14 1300  vancomycin (VANCOCIN) IVPB 1000 mg/200 mL premix     1,000 mg200 mL/hr over 60 Minutes Intravenous  Once 10/24/14 1256 10/24/14 1939       PHYSICAL EXAM: Vital signs in last 24 hours: Filed Vitals:   10/26/14 1306 10/26/14 1837 10/26/14 2124 10/27/14 0639  BP: 125/68 127/93 128/65 153/78  Pulse: 88  81 77  Temp: 98.8 F (37.1 C)  98.9 F (37.2 C) 98.1 F (36.7 C)  TempSrc: Oral  Oral Oral  Resp:   18 16  Height:      Weight:      SpO2: 100%  100% 99%    Weight change:  Filed Weights   10/24/14 1300  Weight: 83.008 kg (183 lb)   Body mass index is 28.66 kg/(m^2).   Gen Exam: Awake and alert with clear speech.   Neck: Supple, No JVD.   Chest: B/L Clear.  No rales or rhonchi CVS: S1 S2 Regular, no murmurs.  Abdomen: soft, BS +, non tender, non distended.  Extremities: Left foot with 2x2 cm ulcer, erythematic and edematous. s/p Right AKA Neurologic: Non Focal.   Skin: No Rash.    Intake/Output from previous day:  Intake/Output Summary (Last 24 hours) at 10/27/14 1357 Last data filed at 10/27/14 0641  Gross per 24 hour  Intake   2328 ml  Output   1100 ml  Net  1228 ml     LAB RESULTS: CBC  Recent Labs Lab 10/24/14 1400 10/25/14 0430 10/26/14 0400 10/27/14 0433  WBC 15.1* 15.5* 14.7* 13.6*  HGB 12.3 10.7* 10.5* 9.7*  HCT 36.8 32.6* 32.2* 29.6*  PLT 319 287 308 367  MCV 83.6 83.2 85.4 84.3  MCH 28.0 27.3 27.9 27.6  MCHC 33.4 32.8 32.6 32.8  RDW 12.5 12.5 12.8 12.9  LYMPHSABS 1.0  --   --   --   MONOABS 1.9*  --   --   --   EOSABS 0.0  --   --   --   BASOSABS 0.0  --   --   --     Chemistries   Recent Labs Lab 10/24/14 1400 10/25/14 0430 10/26/14 0400  NA 131* 136* 143  K 4.6 4.0 3.8  CL 92* 99 107  CO2 22 24 25     GLUCOSE 440* 228* 129*  BUN 17 15 9   CREATININE 0.84 0.91 0.90  CALCIUM 9.1 8.7 8.8    CBG:  Recent Labs Lab 10/26/14 1224 10/26/14 1611 10/26/14 2219 10/27/14 0717 10/27/14 1153  GLUCAP 231* 249* 267* 167* 208*    GFR Estimated Creatinine Clearance: 69.1 mL/min (by C-G formula based on Cr of 0.9).  Coagulation profile No results for input(s): INR, PROTIME in the last 168 hours.  Cardiac Enzymes No results for input(s): CKMB, TROPONINI, MYOGLOBIN in the last 168 hours.  Invalid input(s): CK  Invalid input(s): POCBNP No results for input(s): DDIMER in the last 72 hours.  Recent Labs  10/24/14 1400  HGBA1C 8.0*   No results for input(s): CHOL, HDL, LDLCALC, TRIG, CHOLHDL, LDLDIRECT in the last 72 hours. No results for input(s): TSH, T4TOTAL, T3FREE, THYROIDAB in the last 72 hours.  Invalid input(s): FREET3 No results for input(s): VITAMINB12, FOLATE, FERRITIN, TIBC, IRON, RETICCTPCT in the last 72 hours. No results for input(s): LIPASE, AMYLASE in the last 72 hours.  Urine Studies No results for input(s): UHGB, CRYS in the last 72 hours.  Invalid input(s): UACOL, UAPR, USPG, UPH, UTP, UGL, UKET, UBIL, UNIT, UROB, ULEU, UEPI, UWBC, URBC, UBAC, CAST, UCOM, BILUA  MICROBIOLOGY: Recent Results (from the past 240 hour(s))  Culture, blood (routine x 2)     Status: None (Preliminary result)   Collection Time: 10/24/14  3:35 PM  Result Value Ref Range Status   Specimen Description BLOOD LEFT HAND  Final   Special Requests BOTTLES DRAWN AEROBIC ONLY 10CC  Final   Culture  Setup Time   Final    10/24/2014 21:28 Performed at [facility]    Culture   Final           BLOOD CULTURE RECEIVED NO GROWTH TO DATE CULTURE WILL BE HELD FOR 5 DAYS BEFORE ISSUING A FINAL NEGATIVE REPORT Performed at [facility]    Report Status PENDING  Incomplete  Culture, blood (routine x 2)     Status: None (Preliminary result)   Collection Time: 10/24/14  3:45 PM   Result Value Ref Range Status   Specimen Description BLOOD RIGHT ARM  Final   Special Requests BOTTLES DRAWN AEROBIC ONLY [place]  Final   Culture  Setup Time   Final    10/24/2014 21:28 Performed at [facility]    Culture   Final           BLOOD CULTURE RECEIVED NO GROWTH TO DATE CULTURE WILL BE HELD FOR 5 DAYS BEFORE ISSUING A FINAL NEGATIVE REPORT Performed at  [facility]    Report Status PENDING  Incomplete    RADIOLOGY STUDIES/RESULTS: Dg Foot Complete Left  10/24/2014   CLINICAL DATA:  63 year old female with left foot ulcer and swelling for 5 days. History of diabetes intraconal foot.  EXAM: LEFT FOOT - COMPLETE 3+ VIEW  COMPARISON:  None.  FINDINGS: Extensive deformity within the midfoot and hindfoot noted compatible with Charcot type changes.  Surgical screws within the second toe proximal phalanx and first metatarsal noted.  Soft tissue swelling is present.  No definite radiographic evidence of osteomyelitis or acute bony injury noted.  Flattening of the foot is present.  IMPRESSION: Soft tissue swelling without definite acute bony abnormality or radiographic evidence of osteomyelitis.  Extensive deformity within the midfoot and hindfoot compatible with Charcot type changes.  If there is strong clinical concern for acute osteomyelitis, consider MRI.   Electronically Signed   By: Hassan Rowan M.D.   On: 10/24/2014 16:27    Oren Binet, MD  [facility] Pager:[phone]  If 7PM-7AM, please contact night-coverage [email or web address] Password [email or web address] 10/27/2014, 1:57 PM   LOS: 3 days

## 2014-10-27 NOTE — Progress Notes (Signed)
Pharmacy Consult - Vancomycin  Vancomycin trough = 15.8 mcg / dL (therapeutic) Continues for cellulitis (Day #4)  Plan: Continue Vancomycin 1 gram iv Q 12 hours Continue to follow  Thank you. Anette Guarneri, PharmD [phone]

## 2014-10-27 NOTE — Progress Notes (Signed)
Results for MARLIN, BRYS (MRN [id number]) as of 10/27/2014 12:50  Ref. Range 10/26/2014 12:24 10/26/2014 16:11 10/26/2014 22:19 10/27/2014 07:17 10/27/2014 11:53  Glucose-Capillary Latest Range: 70-99 mg/dL 231 (H) 249 (H) 267 (H) 167 (H) 208 (H)   CBGs continue to be greater than 180 mg/dl.  Recommend adding Novolog 3-4 units TID as meal coverage if eating at least 50% of meals and postprandial blood sugars continue to be greater than 180 mg/dl. Will continue to follow while in hospital. Harvel Ricks RN BSN CDE

## 2014-10-28 ENCOUNTER — Inpatient Hospital Stay (HOSPITAL_COMMUNITY): Payer: Medicare HMO

## 2014-10-28 DIAGNOSIS — M9080 Osteopathy in diseases classified elsewhere, unspecified site: Secondary | ICD-10-CM

## 2014-10-28 DIAGNOSIS — M869 Osteomyelitis, unspecified: Secondary | ICD-10-CM

## 2014-10-28 LAB — GLUCOSE, CAPILLARY
GLUCOSE-CAPILLARY: 72 mg/dL (ref 70–99)
Glucose-Capillary: 167 mg/dL — ABNORMAL HIGH (ref 70–99)
Glucose-Capillary: 139 mg/dL — ABNORMAL HIGH (ref 70–99)
Glucose-Capillary: 241 mg/dL — ABNORMAL HIGH (ref 70–99)

## 2014-10-28 MED ORDER — PROMETHAZINE HCL 25 MG/ML IJ SOLN
12.5000 mg | Freq: Once | INTRAMUSCULAR | Status: AC
  Administered 2014-10-28: 12.5 mg via INTRAVENOUS
  Filled 2014-10-28: qty 1

## 2014-10-28 MED ORDER — SENNOSIDES-DOCUSATE SODIUM 8.6-50 MG PO TABS
2.0000 | ORAL_TABLET | Freq: Two times a day (BID) | ORAL | Status: DC
  Administered 2014-10-28 – 2014-11-02 (×4): 2 via ORAL
  Filled 2014-10-28 (×7): qty 2

## 2014-10-28 MED ORDER — SODIUM CHLORIDE 0.9 % IV SOLN
INTRAVENOUS | Status: DC
  Administered 2014-10-28 – 2014-10-29 (×2): via INTRAVENOUS

## 2014-10-28 MED ORDER — POLYETHYLENE GLYCOL 3350 17 G PO PACK
17.0000 g | PACK | Freq: Every day | ORAL | Status: DC
  Administered 2014-10-28 – 2014-11-02 (×3): 17 g via ORAL
  Filled 2014-10-28 (×6): qty 1

## 2014-10-28 MED ORDER — BISACODYL 10 MG RE SUPP
10.0000 mg | Freq: Every day | RECTAL | Status: DC | PRN
Start: 2014-10-28 — End: 2014-11-02
  Administered 2014-10-28: 10 mg via RECTAL
  Filled 2014-10-28: qty 1

## 2014-10-28 MED ORDER — MORPHINE SULFATE 2 MG/ML IJ SOLN
2.0000 mg | Freq: Once | INTRAMUSCULAR | Status: AC
  Administered 2014-10-28: 2 mg via INTRAVENOUS
  Filled 2014-10-28: qty 1

## 2014-10-28 MED ORDER — TRAMADOL HCL 50 MG PO TABS
50.0000 mg | ORAL_TABLET | Freq: Four times a day (QID) | ORAL | Status: DC | PRN
  Administered 2014-10-28: 50 mg via ORAL
  Filled 2014-10-28: qty 1

## 2014-10-28 NOTE — Progress Notes (Signed)
Patient ID: Diana Coleman, female   DOB: 09-Jun-1951, 63 y.o.   MRN: [id number]   Seen today.  Apparently planned surgery tomorrow.  Orders entered,

## 2014-10-28 NOTE — Plan of Care (Signed)
Problem: Phase I Progression Outcomes Goal: Voiding-avoid urinary catheter unless indicated Outcome: Completed/Met Date Met:  10/28/14 Goal: Hemodynamically stable Outcome: Completed/Met Date Met:  10/28/14  Problem: Phase II Progression Outcomes Goal: Vital signs remain stable Outcome: Completed/Met Date Met:  10/28/14 Goal: Obtain order to discontinue catheter if appropriate Outcome: Not Applicable Date Met:  40/98/11  Problem: Phase III Progression Outcomes Goal: Pain controlled on oral analgesia Outcome: Completed/Met Date Met:  10/28/14 Goal: Voiding independently Outcome: Completed/Met Date Met:  10/28/14 Goal: Foley discontinued Outcome: Not Applicable Date Met:  91/47/82

## 2014-10-28 NOTE — Consult Note (Signed)
  Review of the MRI scan shows osteomyelitis base of the fifth metatarsal left foot with a large plantar abscess.  Examination of the foot patient has varus collapse of the ankle with essentially walking on the lateral column of her foot. There is cellulitis involving the midfoot and hindfoot.  Assessment Charcot collapse left foot with varus collapse of the ankle with osteomyelitis of the fifth metatarsal base and cellulitis involving the midfoot and hindfoot.  Plan: I do not feel there is any surgical reconstruction options. With removal of the lateral column patient's varus alignment would worsen and she would not have a stable plantigrade foot to ambulate. Have recommend proceeding with a transtibial amputation. Patient states she will review this with her family. Patient is tentatively scheduled for surgery Sunday morning.

## 2014-10-28 NOTE — Progress Notes (Signed)
PATIENT DETAILS Name: Diana Coleman Age: 63 y.o. Sex: female Date of Birth: June 26, 1951 Admit Date: 10/24/2014 Admitting Physician Geradine Girt, DO ZDG:LOVFI,EPPIR J, MD  Subjective: No major complaints-agreeable to undergo transtibial amputation tomorrow  Assessment/Plan: Active Problems:   Left Diabetic Foot- Secondary underlying Diabetic Charcot's foot. Better, leukocytosis decreasing. Continue empiric Abx. MRI foot positive for Osteo-scheduled for transtibial amputation on 11/8. Wound care/Ortho following. Blood cultures on 11/3 negative so far.  Diabetes:better controlled, continue Lantus 36 units and SSI. Continue to hold oral hypoglycemics.  JJO:[id number], without any antihypertensive. Resume Lisinopril when able  GERD:PPI  S/p Right AKA-in 2004  Disposition: Remain inpatient  Antibiotics:  IV Vanco 11/3>>  IV Zosyn 11/3>>  DVT Prophylaxis: SCD's for now  Code Status: Full code  Family Communication None at bedside, patient understands treatment plan  Procedures:  None  CONSULTS:  orthopedic surgery  MEDICATIONS: Scheduled Meds: . famotidine  20 mg Oral Daily  . feeding supplement (PRO-STAT SUGAR FREE 64)  30 mL Oral BID BM  . insulin aspart  0-15 Units Subcutaneous TID WC  . insulin aspart  0-5 Units Subcutaneous QHS  . insulin glargine  36 Units Subcutaneous QHS  . piperacillin-tazobactam (ZOSYN)  IV  3.375 g Intravenous Q8H  . polyethylene glycol  17 g Oral Daily  . senna-docusate  2 tablet Oral BID  . vancomycin  1,000 mg Intravenous Q12H   Continuous Infusions:   PRN Meds:.acetaminophen **OR** acetaminophen, bisacodyl, ondansetron **OR** ondansetron (ZOFRAN) IV  Antibiotics: Anti-infectives    Start     Dose/Rate Route Frequency Ordered Stop   10/25/14 0500  vancomycin (VANCOCIN) IVPB 1000 mg/200 mL premix     1,000 mg200 mL/hr over 60 Minutes Intravenous Every 12 hours 10/24/14 1517     10/25/14 0000   piperacillin-tazobactam (ZOSYN) IVPB 3.375 g     3.375 g12.5 mL/hr over 240 Minutes Intravenous Every 8 hours 10/24/14 1517     10/24/14 1300  piperacillin-tazobactam (ZOSYN) IVPB 3.375 g     3.375 g100 mL/hr over 30 Minutes Intravenous  Once 10/24/14 1256 10/24/14 1718   10/24/14 1300  vancomycin (VANCOCIN) IVPB 1000 mg/200 mL premix     1,000 mg200 mL/hr over 60 Minutes Intravenous  Once 10/24/14 1256 10/24/14 1939       PHYSICAL EXAM: Vital signs in last 24 hours: Filed Vitals:   10/27/14 2126 10/28/14 0528 10/28/14 0737 10/28/14 0956  BP: 146/82 154/84 167/96 160/80  Pulse: 79 78 77 78  Temp: 98.5 F (36.9 C) 98.1 F (36.7 C) 98 F (36.7 C) 98.4 F (36.9 C)  TempSrc: Oral Oral Oral Oral  Resp: 16 16 15 16   Height:      Weight:      SpO2: 100% 100% 100% 100%    Weight change:  Filed Weights   10/24/14 1300  Weight: 83.008 kg (183 lb)   Body mass index is 28.66 kg/(m^2).   Gen Exam: Awake and alert with clear speech.   Neck: Supple, No JVD.   Chest: B/L Clear.  No rales or rhonchi CVS: S1 S2 Regular, no murmurs.  Abdomen: soft, BS +, non tender, non distended.  Extremities: Left foot with 2x2 cm ulcer, erythematic and edematous. s/p Right AKA Neurologic: Non Focal.   Skin: No Rash.    Intake/Output from previous day:  Intake/Output Summary (Last 24 hours) at 10/28/14 1138 Last data filed at 10/28/14 1115  Gross per 24 hour  Intake 2718.33 ml  Output   1100 ml  Net 1618.33 ml     LAB RESULTS: CBC  Recent Labs Lab 10/24/14 1400 10/25/14 0430 10/26/14 0400 10/27/14 0433  WBC 15.1* 15.5* 14.7* 13.6*  HGB 12.3 10.7* 10.5* 9.7*  HCT 36.8 32.6* 32.2* 29.6*  PLT 319 287 308 367  MCV 83.6 83.2 85.4 84.3  MCH 28.0 27.3 27.9 27.6  MCHC 33.4 32.8 32.6 32.8  RDW 12.5 12.5 12.8 12.9  LYMPHSABS 1.0  --   --   --   MONOABS 1.9*  --   --   --   EOSABS 0.0  --   --   --   BASOSABS 0.0  --   --   --     Chemistries   Recent Labs Lab 10/24/14 1400  10/25/14 0430 10/26/14 0400  NA 131* 136* 143  K 4.6 4.0 3.8  CL 92* 99 107  CO2 22 24 25   GLUCOSE 440* 228* 129*  BUN 17 15 9   CREATININE 0.84 0.91 0.90  CALCIUM 9.1 8.7 8.8    CBG:  Recent Labs Lab 10/27/14 0717 10/27/14 1153 10/27/14 1729 10/27/14 2107 10/28/14 0740  GLUCAP 167* 208* 193* 259* 72    GFR Estimated Creatinine Clearance: 69.1 mL/min (by C-G formula based on Cr of 0.9).  Coagulation profile No results for input(s): INR, PROTIME in the last 168 hours.  Cardiac Enzymes No results for input(s): CKMB, TROPONINI, MYOGLOBIN in the last 168 hours.  Invalid input(s): CK  Invalid input(s): POCBNP No results for input(s): DDIMER in the last 72 hours. No results for input(s): HGBA1C in the last 72 hours. No results for input(s): CHOL, HDL, LDLCALC, TRIG, CHOLHDL, LDLDIRECT in the last 72 hours. No results for input(s): TSH, T4TOTAL, T3FREE, THYROIDAB in the last 72 hours.  Invalid input(s): FREET3 No results for input(s): VITAMINB12, FOLATE, FERRITIN, TIBC, IRON, RETICCTPCT in the last 72 hours. No results for input(s): LIPASE, AMYLASE in the last 72 hours.  Urine Studies No results for input(s): UHGB, CRYS in the last 72 hours.  Invalid input(s): UACOL, UAPR, USPG, UPH, UTP, UGL, UKET, UBIL, UNIT, UROB, ULEU, UEPI, UWBC, URBC, UBAC, CAST, UCOM, BILUA  MICROBIOLOGY: Recent Results (from the past 240 hour(s))  Culture, blood (routine x 2)     Status: None (Preliminary result)   Collection Time: 10/24/14  3:35 PM  Result Value Ref Range Status   Specimen Description BLOOD LEFT HAND  Final   Special Requests BOTTLES DRAWN AEROBIC ONLY 10CC  Final   Culture  Setup Time   Final    10/24/2014 21:28 Performed at [facility]    Culture   Final           BLOOD CULTURE RECEIVED NO GROWTH TO DATE CULTURE WILL BE HELD FOR 5 DAYS BEFORE ISSUING A FINAL NEGATIVE REPORT Performed at [facility]    Report Status PENDING  Incomplete  Culture,  blood (routine x 2)     Status: None (Preliminary result)   Collection Time: 10/24/14  3:45 PM  Result Value Ref Range Status   Specimen Description BLOOD RIGHT ARM  Final   Special Requests BOTTLES DRAWN AEROBIC ONLY N[place]  Final   Culture  Setup Time   Final    10/24/2014 21:28 Performed at [facility]    Culture   Final           BLOOD CULTURE RECEIVED NO GROWTH TO DATE CULTURE WILL BE HELD FOR 5 DAYS BEFORE ISSUING A  FINAL NEGATIVE REPORT Performed at [facility]    Report Status PENDING  Incomplete    RADIOLOGY STUDIES/RESULTS: Mr Foot Left W Wo Contrast  10/28/2014   CLINICAL DATA:  Diabetic foot ulcer for 1 week.  EXAM: MRI OF THE LEFT FOREFOOT WITHOUT AND WITH CONTRAST  TECHNIQUE: Multiplanar, multisequence MR imaging was performed both before and after administration of intravenous contrast.  CONTRAST:  4mL MULTIHANCE GADOBENATE DIMEGLUMINE 529 MG/ML IV SOLN  COMPARISON:  Radiographs 10/24/2014  FINDINGS: Severe deformity of the foot due to Charcot changes. The midfoot is fused. Marked pes planus with rocker bottom foot appearance. The talus is in a vertical orientation. There are surgical changes involving the first metatarsal and second proximal phalanx. No findings to suggest septic arthritis.  There is subcutaneous soft tissue swelling/edema consistent with cellulitis. No significant findings for myofasciitis. There is an open wound on the plantar aspect of the foot with an associated soft tissue abscess measuring 3.4 x 2.5 cm. There is also osteomyelitis involving the base of the fifth metatarsal with diffuse marrow edema and enhancement.  IMPRESSION: 1. Severe chronic Charcot foot. 2. Open wound on the plantar aspect of the midfoot with underline abscess. Diffuse cellulitis is also noted. 3. Osteomyelitis involving the base of the fifth metatarsal.   Electronically Signed   By: Kalman Jewels M.D.   On: 10/28/2014 08:22   Dg Foot Complete Left  10/24/2014    CLINICAL DATA:  63 year old female with left foot ulcer and swelling for 5 days. History of diabetes intraconal foot.  EXAM: LEFT FOOT - COMPLETE 3+ VIEW  COMPARISON:  None.  FINDINGS: Extensive deformity within the midfoot and hindfoot noted compatible with Charcot type changes.  Surgical screws within the second toe proximal phalanx and first metatarsal noted.  Soft tissue swelling is present.  No definite radiographic evidence of osteomyelitis or acute bony injury noted.  Flattening of the foot is present.  IMPRESSION: Soft tissue swelling without definite acute bony abnormality or radiographic evidence of osteomyelitis.  Extensive deformity within the midfoot and hindfoot compatible with Charcot type changes.  If there is strong clinical concern for acute osteomyelitis, consider MRI.   Electronically Signed   By: Hassan Rowan M.D.   On: 10/24/2014 16:27    Oren Binet, MD  [facility] Pager:[phone]  If 7PM-7AM, please contact night-coverage [email or web address] Password [email or web address] 10/28/2014, 11:38 AM   LOS: 4 days

## 2014-10-29 ENCOUNTER — Encounter (HOSPITAL_COMMUNITY): Payer: Self-pay | Admitting: Anesthesiology

## 2014-10-29 ENCOUNTER — Inpatient Hospital Stay (HOSPITAL_COMMUNITY): Payer: Medicare HMO | Admitting: Anesthesiology

## 2014-10-29 ENCOUNTER — Encounter (HOSPITAL_COMMUNITY)
Admission: AD | Disposition: A | Discharge status: Skilled Nursing Facility | Payer: Self-pay | Source: Ambulatory Visit | Attending: Internal Medicine

## 2014-10-29 HISTORY — PX: AMPUTATION: SHX166

## 2014-10-29 LAB — CBC
HEMATOCRIT: 32.6 % — AB (ref 36.0–46.0)
HEMOGLOBIN: 10.7 g/dL — AB (ref 12.0–15.0)
MCH: 27.9 pg (ref 26.0–34.0)
MCHC: 32.8 g/dL (ref 30.0–36.0)
MCV: 84.9 fL (ref 78.0–100.0)
Platelets: 484 10*3/uL — ABNORMAL HIGH (ref 150–400)
RBC: 3.84 MIL/uL — ABNORMAL LOW (ref 3.87–5.11)
RDW: 13.1 % (ref 11.5–15.5)
WBC: 14.8 10*3/uL — AB (ref 4.0–10.5)

## 2014-10-29 LAB — GLUCOSE, CAPILLARY
GLUCOSE-CAPILLARY: 83 mg/dL (ref 70–99)
GLUCOSE-CAPILLARY: 92 mg/dL (ref 70–99)
GLUCOSE-CAPILLARY: 122 mg/dL — AB (ref 70–99)
GLUCOSE-CAPILLARY: 79 mg/dL (ref 70–99)
Glucose-Capillary: 206 mg/dL — ABNORMAL HIGH (ref 70–99)

## 2014-10-29 LAB — BASIC METABOLIC PANEL
Anion gap: 12 (ref 5–15)
BUN: 13 mg/dL (ref 6–23)
CHLORIDE: 103 meq/L (ref 96–112)
CO2: 28 mEq/L (ref 19–32)
CREATININE: 1.21 mg/dL — AB (ref 0.50–1.10)
Calcium: 8.5 mg/dL (ref 8.4–10.5)
GFR calc non Af Amer: 46 mL/min — ABNORMAL LOW (ref >90)
GFR, EST AFRICAN AMERICAN: 53 mL/min — AB (ref >90)
GLUCOSE: 133 mg/dL — AB (ref 70–99)
POTASSIUM: 3.7 meq/L (ref 3.7–5.3)
Sodium: 143 mEq/L (ref 137–147)

## 2014-10-29 SURGERY — AMPUTATION BELOW KNEE
Anesthesia: General | Site: Leg Upper | Laterality: Left

## 2014-10-29 MED ORDER — VANCOMYCIN HCL 1000 MG IV SOLR
1000.0000 mg | INTRAVENOUS | Status: DC | PRN
  Administered 2014-10-29: 1000 mg via INTRAVENOUS

## 2014-10-29 MED ORDER — FENTANYL CITRATE 0.05 MG/ML IJ SOLN
INTRAMUSCULAR | Status: AC
  Filled 2014-10-29: qty 5

## 2014-10-29 MED ORDER — PROPOFOL 10 MG/ML IV BOLUS
INTRAVENOUS | Status: AC
  Filled 2014-10-29: qty 20

## 2014-10-29 MED ORDER — PROPOFOL 10 MG/ML IV BOLUS
INTRAVENOUS | Status: DC | PRN
  Administered 2014-10-29: 150 mg via INTRAVENOUS

## 2014-10-29 MED ORDER — OXYCODONE-ACETAMINOPHEN 5-325 MG PO TABS
1.0000 | ORAL_TABLET | ORAL | Status: DC | PRN
  Administered 2014-10-30 – 2014-10-31 (×2): 1 via ORAL
  Filled 2014-10-29 (×2): qty 1

## 2014-10-29 MED ORDER — LACTATED RINGERS IV SOLN
INTRAVENOUS | Status: DC | PRN
  Administered 2014-10-29: 08:00:00 via INTRAVENOUS

## 2014-10-29 MED ORDER — HYDRALAZINE HCL 20 MG/ML IJ SOLN
10.0000 mg | Freq: Four times a day (QID) | INTRAMUSCULAR | Status: DC | PRN
  Administered 2014-10-31: 10 mg via INTRAVENOUS
  Filled 2014-10-29: qty 1

## 2014-10-29 MED ORDER — FENTANYL CITRATE 0.05 MG/ML IJ SOLN
INTRAMUSCULAR | Status: DC | PRN
  Administered 2014-10-29: 50 ug via INTRAVENOUS

## 2014-10-29 MED ORDER — ONDANSETRON HCL 4 MG PO TABS: 4.0000 mg | ORAL_TABLET | Freq: Four times a day (QID) | ORAL | Status: DC | PRN

## 2014-10-29 MED ORDER — DOCUSATE SODIUM 100 MG PO CAPS
100.0000 mg | ORAL_CAPSULE | Freq: Two times a day (BID) | ORAL | Status: DC
  Administered 2014-10-31 – 2014-11-02 (×3): 100 mg via ORAL
  Filled 2014-10-29 (×6): qty 1

## 2014-10-29 MED ORDER — HYDROMORPHONE HCL 1 MG/ML IJ SOLN
0.5000 mg | INTRAMUSCULAR | Status: DC | PRN
  Administered 2014-10-30: 0.5 mg via INTRAVENOUS
  Administered 2014-10-30 – 2014-10-31 (×3): 1 mg via INTRAVENOUS
  Filled 2014-10-29 (×5): qty 1

## 2014-10-29 MED ORDER — 0.9 % SODIUM CHLORIDE (POUR BTL) OPTIME
TOPICAL | Status: DC | PRN
Start: 2014-10-29 — End: 2014-10-29
  Administered 2014-10-29: 1000 mL

## 2014-10-29 MED ORDER — ONDANSETRON HCL 4 MG/2ML IJ SOLN
4.0000 mg | Freq: Four times a day (QID) | INTRAMUSCULAR | Status: DC | PRN
  Administered 2014-10-31: 4 mg via INTRAVENOUS
  Filled 2014-10-29 (×3): qty 2

## 2014-10-29 MED ORDER — METHOCARBAMOL 1000 MG/10ML IJ SOLN
500.0000 mg | Freq: Four times a day (QID) | INTRAVENOUS | Status: DC | PRN
  Filled 2014-10-29: qty 5

## 2014-10-29 MED ORDER — FENTANYL CITRATE 0.05 MG/ML IJ SOLN
INTRAMUSCULAR | Status: AC
  Administered 2014-10-29: 25 ug via INTRAVENOUS
  Filled 2014-10-29: qty 2

## 2014-10-29 MED ORDER — LIDOCAINE HCL (CARDIAC) 20 MG/ML IV SOLN
INTRAVENOUS | Status: DC | PRN
  Administered 2014-10-29: 50 mg via INTRAVENOUS

## 2014-10-29 MED ORDER — METOCLOPRAMIDE HCL 5 MG PO TABS: 5.0000 mg | ORAL_TABLET | Freq: Three times a day (TID) | ORAL | Status: DC | PRN

## 2014-10-29 MED ORDER — FENTANYL CITRATE 0.05 MG/ML IJ SOLN
25.0000 ug | INTRAMUSCULAR | Status: DC | PRN
  Administered 2014-10-29: 25 ug via INTRAVENOUS

## 2014-10-29 MED ORDER — METHOCARBAMOL 500 MG PO TABS: 500.0000 mg | ORAL_TABLET | Freq: Four times a day (QID) | ORAL | Status: DC | PRN

## 2014-10-29 MED ORDER — ONDANSETRON HCL 4 MG/2ML IJ SOLN
INTRAMUSCULAR | Status: AC
  Filled 2014-10-29: qty 2

## 2014-10-29 MED ORDER — MIDAZOLAM HCL 2 MG/2ML IJ SOLN
INTRAMUSCULAR | Status: AC
  Filled 2014-10-29: qty 2

## 2014-10-29 MED ORDER — METOCLOPRAMIDE HCL 5 MG/ML IJ SOLN
5.0000 mg | Freq: Three times a day (TID) | INTRAMUSCULAR | Status: DC | PRN
  Administered 2014-10-30 – 2014-11-01 (×3): 10 mg via INTRAVENOUS
  Filled 2014-10-29 (×3): qty 2

## 2014-10-29 MED ORDER — CHLORHEXIDINE GLUCONATE 4 % EX LIQD
60.0000 mL | Freq: Once | CUTANEOUS | Status: AC
  Administered 2014-10-29: 4 via TOPICAL
  Filled 2014-10-29: qty 60

## 2014-10-29 MED ORDER — SODIUM CHLORIDE 0.9 % IV SOLN: INTRAVENOUS | Status: DC

## 2014-10-29 MED ORDER — SODIUM CHLORIDE 0.9 % IV SOLN
INTRAVENOUS | Status: DC
  Administered 2014-10-29 – 2014-10-30 (×3): via INTRAVENOUS

## 2014-10-29 SURGICAL SUPPLY — 37 items
BLADE SAW RECIP 87.9 MT (BLADE) ×2 IMPLANT
BLADE SURG 21 STRL SS (BLADE) ×2 IMPLANT
BNDG COHESIVE 6X5 TAN STRL LF (GAUZE/BANDAGES/DRESSINGS) ×4 IMPLANT
BNDG GAUZE ELAST 4 BULKY (GAUZE/BANDAGES/DRESSINGS) ×4 IMPLANT
COVER SURGICAL LIGHT HANDLE (MISCELLANEOUS) ×2 IMPLANT
CUFF TOURNIQUET SINGLE 34IN LL (TOURNIQUET CUFF) IMPLANT
CUFF TOURNIQUET SINGLE 44IN (TOURNIQUET CUFF) IMPLANT
DRAPE EXTREMITY T 121X128X90 (DRAPE) ×2 IMPLANT
DRAPE PROXIMA HALF (DRAPES) IMPLANT
DRAPE U-SHAPE 47X51 STRL (DRAPES) ×2 IMPLANT
DRSG ADAPTIC 3X8 NADH LF (GAUZE/BANDAGES/DRESSINGS) ×2 IMPLANT
DRSG PAD ABDOMINAL 8X10 ST (GAUZE/BANDAGES/DRESSINGS) ×4 IMPLANT
DURAPREP 26ML APPLICATOR (WOUND CARE) ×2 IMPLANT
ELECT REM PT RETURN 9FT ADLT (ELECTROSURGICAL) ×2
ELECTRODE REM PT RTRN 9FT ADLT (ELECTROSURGICAL) ×1 IMPLANT
GAUZE SPONGE 4X4 12PLY STRL (GAUZE/BANDAGES/DRESSINGS) ×2 IMPLANT
GLOVE BIOGEL PI IND STRL 9 (GLOVE) ×1 IMPLANT
GLOVE BIOGEL PI INDICATOR 9 (GLOVE) ×1
GLOVE SURG ORTHO 9.0 STRL STRW (GLOVE) ×2 IMPLANT
GOWN STRL REUS W/ TWL XL LVL3 (GOWN DISPOSABLE) ×2 IMPLANT
GOWN STRL REUS W/TWL XL LVL3 (GOWN DISPOSABLE) ×2
KIT BASIN OR (CUSTOM PROCEDURE TRAY) ×2 IMPLANT
KIT ROOM TURNOVER OR (KITS) ×2 IMPLANT
MANIFOLD NEPTUNE II (INSTRUMENTS) ×2 IMPLANT
NS IRRIG 1000ML POUR BTL (IV SOLUTION) ×2 IMPLANT
PACK GENERAL/GYN (CUSTOM PROCEDURE TRAY) ×2 IMPLANT
PAD ARMBOARD 7.5X6 YLW CONV (MISCELLANEOUS) ×4 IMPLANT
SPONGE LAP 18X18 X RAY DECT (DISPOSABLE) ×2 IMPLANT
STAPLER VISISTAT 35W (STAPLE) IMPLANT
STOCKINETTE IMPERVIOUS LG (DRAPES) ×2 IMPLANT
SUT ETHILON 2 0 PSLX (SUTURE) ×2 IMPLANT
SUT SILK 2 0 (SUTURE) ×1
SUT SILK 2-0 18XBRD TIE 12 (SUTURE) ×1 IMPLANT
SUT VIC AB 1 CTX 27 (SUTURE) ×4 IMPLANT
TOWEL OR 17X24 6PK STRL BLUE (TOWEL DISPOSABLE) ×2 IMPLANT
TOWEL OR 17X26 10 PK STRL BLUE (TOWEL DISPOSABLE) ×2 IMPLANT
WATER STERILE IRR 1000ML POUR (IV SOLUTION) ×2 IMPLANT

## 2014-10-29 NOTE — Anesthesia Preprocedure Evaluation (Addendum)
Anesthesia Evaluation  Patient identified by MRN, date of birth, ID band Patient awake    Reviewed: Allergy & Precautions, H&P , NPO status , Patient's Chart, lab work & pertinent test results  Airway Mallampati: I  TM Distance: >3 FB Neck ROM: Full    Dental  (+) Upper Dentures, Partial Lower   Pulmonary neg pulmonary ROS, former smoker,    Pulmonary exam normal       Cardiovascular hypertension,     Neuro/Psych    GI/Hepatic GERD-  ,  Endo/Other  diabetes, Insulin Dependent, Oral Hypoglycemic Agents  Renal/GU Renal InsufficiencyRenal disease     Musculoskeletal   Abdominal   Peds  Hematology negative hematology ROS (+)   Anesthesia Other Findings   Reproductive/Obstetrics                           Anesthesia Physical Anesthesia Plan  ASA: III  Anesthesia Plan: General   Post-op Pain Management:    Induction: Intravenous  Airway Management Planned: LMA  Additional Equipment:   Intra-op Plan:   Post-operative Plan:   Informed Consent:   Plan Discussed with:   Anesthesia Plan Comments:         Anesthesia Quick Evaluation

## 2014-10-29 NOTE — Progress Notes (Signed)
PATIENT DETAILS Name: Diana Coleman Age: 63 y.o. Sex: female Date of Birth: Aug 05, 1951 Admit Date: 10/24/2014 Admitting Physician Geradine Girt, DO QIH:[id number],ZDGLO J, MD  Subjective: No major complaints-agreeable to undergo transtibial amputation tomorrow  Assessment/Plan: Active Problems:   Left Diabetic Foot- Secondary underlying Diabetic Charcot's foot. Better, leukocytosis decreasing. Continue empiric Abx. MRI foot positive for Osteo-underwent transtibial amputation on 11/8. Wound care/Ortho following. Blood cultures on 11/3 negative so far.   Nausea/Vomiting:occurred on 11/7, resolved with supportive care. X-ray abdomen negative for acute abnormalities. Continue to still be advanced diet, when necessary antiemetics   Diabetes:better controlled, continue Lantus 36 units and SSI. Continue to hold oral hypoglycemics.   VFI:EPPIRJJOAC controlled, Resume Lisinopril when oral intake better. Use prn Hydralazine.   GERD:PPI   S/p Right AKA-in 2004  Disposition: Remain inpatient  Antibiotics:  IV Vanco 11/3>>  IV Zosyn 11/3>>  DVT Prophylaxis: SCD's for now  Code Status: Full code  Family Communication None at bedside, patient understands treatment plan  Procedures:  None  CONSULTS:  orthopedic surgery  MEDICATIONS: Scheduled Meds: . docusate sodium  100 mg Oral BID  . famotidine  20 mg Oral Daily  . feeding supplement (PRO-STAT SUGAR FREE 64)  30 mL Oral BID BM  . insulin aspart  0-15 Units Subcutaneous TID WC  . insulin aspart  0-5 Units Subcutaneous QHS  . insulin glargine  36 Units Subcutaneous QHS  . ondansetron      . piperacillin-tazobactam (ZOSYN)  IV  3.375 g Intravenous Q8H  . polyethylene glycol  17 g Oral Daily  . senna-docusate  2 tablet Oral BID  . vancomycin  1,000 mg Intravenous Q12H   Continuous Infusions: . sodium chloride 100 mL/hr at 10/29/14 0036  . sodium chloride     PRN Meds:.acetaminophen **OR** acetaminophen,  bisacodyl, HYDROmorphone (DILAUDID) injection, methocarbamol **OR** methocarbamol (ROBAXIN)  IV, metoCLOPramide **OR** metoCLOPramide (REGLAN) injection, ondansetron **OR** ondansetron (ZOFRAN) IV, ondansetron **OR** ondansetron (ZOFRAN) IV, oxyCODONE-acetaminophen, traMADol  Antibiotics: Anti-infectives    Start     Dose/Rate Route Frequency Ordered Stop   10/25/14 0500  vancomycin (VANCOCIN) IVPB 1000 mg/200 mL premix     1,000 mg200 mL/hr over 60 Minutes Intravenous Every 12 hours 10/24/14 1517     10/25/14 0000  piperacillin-tazobactam (ZOSYN) IVPB 3.375 g     3.375 g12.5 mL/hr over 240 Minutes Intravenous Every 8 hours 10/24/14 1517     10/24/14 1300  piperacillin-tazobactam (ZOSYN) IVPB 3.375 g     3.375 g100 mL/hr over 30 Minutes Intravenous  Once 10/24/14 1256 10/24/14 1718   10/24/14 1300  vancomycin (VANCOCIN) IVPB 1000 mg/200 mL premix     1,000 mg200 mL/hr over 60 Minutes Intravenous  Once 10/24/14 1256 10/24/14 1939       PHYSICAL EXAM: Vital signs in last 24 hours: Filed Vitals:   10/29/14 0928 10/29/14 0942 10/29/14 1003 10/29/14 1044  BP: 165/77 165/86 170/80   Pulse: 81 81 81   Temp: 98.5 F (36.9 C)  98.7 F (37.1 C)   TempSrc:   Oral   Resp: 12 12 16    Height:      Weight:    81.874 kg (180 lb 8 oz)  SpO2: 100% 98% 99%     Weight change:  Filed Weights   10/24/14 1300 10/29/14 1044  Weight: 83.008 kg (183 lb) 81.874 kg (180 lb 8 oz)   Body mass index is 28.26 kg/(m^2).   Gen Exam: Awake and  alert with clear speech.   Neck: Supple, No JVD.   Chest: B/L Clear.  No rales or rhonchi CVS: S1 S2 Regular, no murmurs.  Abdomen: soft, BS +, non tender, non distended.  Extremities:Now Transtibial amputation, s/p Right AKA Neurologic: Non Focal.   Skin: No Rash.    Intake/Output from previous day:  Intake/Output Summary (Last 24 hours) at 10/29/14 1300 Last data filed at 10/29/14 0940  Gross per 24 hour  Intake    972 ml  Output    725 ml  Net    247 ml      LAB RESULTS: CBC  Recent Labs Lab 10/24/14 1400 10/25/14 0430 10/26/14 0400 10/27/14 0433 10/29/14 0510  WBC 15.1* 15.5* 14.7* 13.6* 14.8*  HGB 12.3 10.7* 10.5* 9.7* 10.7*  HCT 36.8 32.6* 32.2* 29.6* 32.6*  PLT 319 287 308 367 484*  MCV 83.6 83.2 85.4 84.3 84.9  MCH 28.0 27.3 27.9 27.6 27.9  MCHC 33.4 32.8 32.6 32.8 32.8  RDW 12.5 12.5 12.8 12.9 13.1  LYMPHSABS 1.0  --   --   --   --   MONOABS 1.9*  --   --   --   --   EOSABS 0.0  --   --   --   --   BASOSABS 0.0  --   --   --   --     Chemistries   Recent Labs Lab 10/24/14 1400 10/25/14 0430 10/26/14 0400 10/29/14 0510  NA 131* 136* 143 143  K 4.6 4.0 3.8 3.7  CL 92* 99 107 103  CO2 22 24 25 28   GLUCOSE 440* 228* 129* 133*  BUN 17 15 9 13   CREATININE 0.84 0.91 0.90 1.21*  CALCIUM 9.1 8.7 8.8 8.5    CBG:  Recent Labs Lab 10/28/14 1650 10/28/14 2222 10/29/14 0844 10/29/14 1010 10/29/14 1157  GLUCAP 139* 241* 83 92 122*    GFR Estimated Creatinine Clearance: 51 mL/min (by C-G formula based on Cr of 1.21).  Coagulation profile No results for input(s): INR, PROTIME in the last 168 hours.  Cardiac Enzymes No results for input(s): CKMB, TROPONINI, MYOGLOBIN in the last 168 hours.  Invalid input(s): CK  Invalid input(s): POCBNP No results for input(s): DDIMER in the last 72 hours. No results for input(s): HGBA1C in the last 72 hours. No results for input(s): CHOL, HDL, LDLCALC, TRIG, CHOLHDL, LDLDIRECT in the last 72 hours. No results for input(s): TSH, T4TOTAL, T3FREE, THYROIDAB in the last 72 hours.  Invalid input(s): FREET3 No results for input(s): VITAMINB12, FOLATE, FERRITIN, TIBC, IRON, RETICCTPCT in the last 72 hours. No results for input(s): LIPASE, AMYLASE in the last 72 hours.  Urine Studies No results for input(s): UHGB, CRYS in the last 72 hours.  Invalid input(s): UACOL, UAPR, USPG, UPH, UTP, UGL, UKET, UBIL, UNIT, UROB, ULEU, UEPI, UWBC, URBC, UBAC, CAST, UCOM,  BILUA  MICROBIOLOGY: Recent Results (from the past 240 hour(s))  Culture, blood (routine x 2)     Status: None (Preliminary result)   Collection Time: 10/24/14  3:35 PM  Result Value Ref Range Status   Specimen Description BLOOD LEFT HAND  Final   Special Requests BOTTLES DRAWN AEROBIC ONLY 10CC  Final   Culture  Setup Time   Final    10/24/2014 21:28 Performed at [place]   Final           BLOOD CULTURE RECEIVED NO GROWTH TO DATE CULTURE WILL BE HELD FOR 5  DAYS BEFORE ISSUING A FINAL NEGATIVE REPORT Performed at [facility]    Report Status PENDING  Incomplete  Culture, blood (routine x 2)     Status: None (Preliminary result)   Collection Time: 10/24/14  3:45 PM  Result Value Ref Range Status   Specimen Description BLOOD RIGHT ARM  Final   Special Requests BOTTLES DRAWN AEROBIC ONLY 8CC  Final   Culture  Setup Time   Final    10/24/2014 21:28 Performed at [facility]    Culture   Final           BLOOD CULTURE RECEIVED NO GROWTH TO DATE CULTURE WILL BE HELD FOR 5 DAYS BEFORE ISSUING A FINAL NEGATIVE REPORT Performed at [facility]    Report Status PENDING  Incomplete    RADIOLOGY STUDIES/RESULTS: Mr Foot Left W Wo Contrast  10/28/2014   CLINICAL DATA:  Diabetic foot ulcer for 1 week.  EXAM: MRI OF THE LEFT FOREFOOT WITHOUT AND WITH CONTRAST  TECHNIQUE: Multiplanar, multisequence MR imaging was performed both before and after administration of intravenous contrast.  CONTRAST:  22mL MULTIHANCE GADOBENATE DIMEGLUMINE 529 MG/ML IV SOLN  COMPARISON:  Radiographs 10/24/2014  FINDINGS: Severe deformity of the foot due to Charcot changes. The midfoot is fused. Marked pes planus with rocker bottom foot appearance. The talus is in a vertical orientation. There are surgical changes involving the first metatarsal and second proximal phalanx. No findings to suggest septic arthritis.  There is subcutaneous soft tissue swelling/edema consistent  with cellulitis. No significant findings for myofasciitis. There is an open wound on the plantar aspect of the foot with an associated soft tissue abscess measuring 3.4 x 2.5 cm. There is also osteomyelitis involving the base of the fifth metatarsal with diffuse marrow edema and enhancement.  IMPRESSION: 1. Severe chronic Charcot foot. 2. Open wound on the plantar aspect of the midfoot with underline abscess. Diffuse cellulitis is also noted. 3. Osteomyelitis involving the base of the fifth metatarsal.   Electronically Signed   By: Kalman Jewels M.D.   On: 10/28/2014 08:22   Dg Abd 2 Views  10/28/2014   CLINICAL DATA:  Vomiting  EXAM: ABDOMEN - 2 VIEW  COMPARISON:  None.  FINDINGS: Nonobstructive bowel gas pattern.  No evidence of free air on the lateral decubitus view.  Degenerative changes of the lower lumbar spine.  Calcified uterine fibroid.  IMPRESSION: No evidence of small bowel obstruction or free air.   Electronically Signed   By: Julian Hy M.D.   On: 10/28/2014 19:18   Dg Foot Complete Left  10/24/2014   CLINICAL DATA:  63 year old female with left foot ulcer and swelling for 5 days. History of diabetes intraconal foot.  EXAM: LEFT FOOT - COMPLETE 3+ VIEW  COMPARISON:  None.  FINDINGS: Extensive deformity within the midfoot and hindfoot noted compatible with Charcot type changes.  Surgical screws within the second toe proximal phalanx and first metatarsal noted.  Soft tissue swelling is present.  No definite radiographic evidence of osteomyelitis or acute bony injury noted.  Flattening of the foot is present.  IMPRESSION: Soft tissue swelling without definite acute bony abnormality or radiographic evidence of osteomyelitis.  Extensive deformity within the midfoot and hindfoot compatible with Charcot type changes.  If there is strong clinical concern for acute osteomyelitis, consider MRI.   Electronically Signed   By: Hassan Rowan M.D.   On: 10/24/2014 16:27    Oren Binet, MD  [facility] Pager:[phone]  If 7PM-7AM, please contact night-coverage [email or web address] Password [email or web address] 10/29/2014, 1:00 PM   LOS: 5 days

## 2014-10-29 NOTE — Anesthesia Postprocedure Evaluation (Signed)
  Anesthesia Post-op Note  Patient: Diana Coleman  Procedure(s) Performed: Procedure(s): AMPUTATION BELOW KNEE - LEFT (Left)  Patient Location: PACU  Anesthesia Type:General  Level of Consciousness: awake  Airway and Oxygen Therapy: Patient Spontanous Breathing  Post-op Pain: mild  Post-op Assessment: Post-op Vital signs reviewed  Post-op Vital Signs: Reviewed  Last Vitals:  Filed Vitals:   10/29/14 0900  BP:   Pulse: 81  Temp:   Resp: 14    Complications: No apparent anesthesia complications

## 2014-10-29 NOTE — Anesthesia Procedure Notes (Signed)
Procedure Name: LMA Insertion Date/Time: 10/29/2014 7:56 AM Performed by: Eligha Bridegroom Pre-anesthesia Checklist: Patient identified, Timeout performed, Suction available, Emergency Drugs available and Patient being monitored Patient Re-evaluated:Patient Re-evaluated prior to inductionOxygen Delivery Method: Circle system utilized Preoxygenation: Pre-oxygenation with 100% oxygen Intubation Type: IV induction LMA: LMA inserted and LMA with gastric port inserted LMA Size: 4.0 Placement Confirmation: positive ETCO2 Dental Injury: Teeth and Oropharynx as per pre-operative assessment

## 2014-10-29 NOTE — H&P (View-Only) (Signed)
Patient ID: Diana Coleman, female   DOB: [birth year], 63 y.o.   MRN: [id number] Patient has a Charcot collapse of the left foot. She has a plantar ulcer with redness. Radiographs shows no definite osteomyelitis by plain films. The MRI scan is still pending. She has a stable right transtibial amputation. I will evaluate once the MRI scan is completed.

## 2014-10-29 NOTE — Op Note (Signed)
   Date of Surgery: 10/29/2014  INDICATIONS: Ms. Buske is a 63 y.o.-year-old female who has diabetic insensate neuropathy Charcot collapse of the left foot with ulceration and osteomyelitis of the midfoot with varus collapse.Marland Kitchen  PREOPERATIVE DIAGNOSIS: osteomyelitis ulceration Charcot arthropathy left foot  POSTOPERATIVE DIAGNOSIS: Same.  PROCEDURE: Transtibial amputation  SURGEON: Sharol Given, M.D.  ANESTHESIA:  general  IV FLUIDS AND URINE: See anesthesia.  ESTIMATED BLOOD LOSS: min mL.  COMPLICATIONS: None.  DESCRIPTION OF PROCEDURE: The patient was brought to the operating room and underwent a general anesthetic. After adequate levels of anesthesia were obtained patient's lower extremity was prepped using DuraPrep draped into a sterile field. A timeout was called.  A transverse incision was made 11 cm distal to the tibial tubercle. This curved proximally and a large posterior flap was created. The tibia was transected 1 cm proximal to the skin incision. The fibula was transected just proximal to the tibial incision. The tibia was beveled anteriorly. A large posterior flap was created. The sciatic nerve was pulled cut and allowed to retract. The vascular bundles were suture ligated with 2-0 silk. The deep and superficial fascial layers were closed using #1 Vicryl. The skin was closed using staples and 2-0 nylon. The wound was covered with Adaptic orthopedic sponges AB dressing Kerlix and Coban. Patient was extubated taken to the PACU in stable condition.  Meridee Score, MD [place] 8:33 AM

## 2014-10-29 NOTE — Transfer of Care (Signed)
Immediate Anesthesia Transfer of Care Note  Patient: Diana Coleman  Procedure(s) Performed: Procedure(s): AMPUTATION BELOW KNEE - LEFT (Left)  Patient Location: PACU  Anesthesia Type:General  Level of Consciousness: awake, alert  and oriented  Airway & Oxygen Therapy: Patient Spontanous Breathing and Patient connected to nasal cannula oxygen  Post-op Assessment: Report given to PACU RN and Post -op Vital signs reviewed and stable  Post vital signs: Reviewed and stable  Complications: No apparent anesthesia complications

## 2014-10-29 NOTE — Interval H&P Note (Signed)
History and Physical Interval Note:  10/29/2014 7:29 AM  Diana Coleman  has presented today for surgery, with the diagnosis of OSTEO LEFT FOOT  The various methods of treatment have been discussed with the patient and family. After consideration of risks, benefits and other options for treatment, the patient has consented to  Procedure(s): AMPUTATION BELOW KNEE - LEFT (Left) as a surgical intervention .  The patient's history has been reviewed, patient examined, no change in status, stable for surgery.  I have reviewed the patient's chart and labs.  Questions were answered to the patient's satisfaction.     Rozann Holts V

## 2014-10-30 DIAGNOSIS — N179 Acute kidney failure, unspecified: Secondary | ICD-10-CM

## 2014-10-30 DIAGNOSIS — Z89512 Acquired absence of left leg below knee: Secondary | ICD-10-CM

## 2014-10-30 LAB — BASIC METABOLIC PANEL
ANION GAP: 12 (ref 5–15)
BUN: 16 mg/dL (ref 6–23)
CALCIUM: 7.9 mg/dL — AB (ref 8.4–10.5)
CO2: 27 meq/L (ref 19–32)
CREATININE: 1.64 mg/dL — AB (ref 0.50–1.10)
Chloride: 106 mEq/L (ref 96–112)
GFR calc Af Amer: 37 mL/min — ABNORMAL LOW (ref >90)
GFR, EST NON AFRICAN AMERICAN: 32 mL/min — AB (ref >90)
Glucose, Bld: 49 mg/dL — ABNORMAL LOW (ref 70–99)
Potassium: 3.5 mEq/L — ABNORMAL LOW (ref 3.7–5.3)
SODIUM: 145 meq/L (ref 137–147)

## 2014-10-30 LAB — CULTURE, BLOOD (ROUTINE X 2)
Culture: NO GROWTH
Culture: NO GROWTH

## 2014-10-30 LAB — GLUCOSE, CAPILLARY
GLUCOSE-CAPILLARY: 61 mg/dL — AB (ref 70–99)
Glucose-Capillary: 104 mg/dL — ABNORMAL HIGH (ref 70–99)
Glucose-Capillary: 69 mg/dL — ABNORMAL LOW (ref 70–99)
Glucose-Capillary: 112 mg/dL — ABNORMAL HIGH (ref 70–99)
Glucose-Capillary: 77 mg/dL (ref 70–99)
Glucose-Capillary: 155 mg/dL — ABNORMAL HIGH (ref 70–99)

## 2014-10-30 MED ORDER — HEPARIN SODIUM (PORCINE) 5000 UNIT/ML IJ SOLN
5000.0000 [IU] | Freq: Three times a day (TID) | INTRAMUSCULAR | Status: DC
  Administered 2014-10-30 – 2014-11-02 (×8): 5000 [IU] via SUBCUTANEOUS
  Filled 2014-10-30 (×10): qty 1

## 2014-10-30 MED ORDER — SODIUM CHLORIDE 0.9 % IV SOLN
INTRAVENOUS | Status: DC
  Administered 2014-10-30: 13:00:00 via INTRAVENOUS

## 2014-10-30 MED ORDER — HEPARIN SODIUM (PORCINE) 1000 UNIT/ML IJ SOLN
5000.0000 [IU] | Freq: Three times a day (TID) | INTRAMUSCULAR | Status: DC
  Filled 2014-10-30 (×2): qty 5

## 2014-10-30 MED ORDER — INSULIN GLARGINE 100 UNIT/ML ~~LOC~~ SOLN
30.0000 [IU] | Freq: Every day | SUBCUTANEOUS | Status: DC
  Administered 2014-10-30: 30 [IU] via SUBCUTANEOUS
  Filled 2014-10-30 (×2): qty 0.3

## 2014-10-30 NOTE — Evaluation (Addendum)
Physical Therapy Evaluation Patient Details Name: Diana QUIZHPI MRN: [id number] DOB: [birth year] Today's Date: 10/30/2014   History of Present Illness  Patient is a 63 y/o female s/p left transtibial amputation. PMH s/p right BKA 2004, DM and charcot foot due to DM.  Clinical Impression  Patient presents with functional limitations due to deficits listed in PT problem list (see below). Pt recently moved from [place] and very independent PTA. Pt just got a new prosthesis for RLE in the last couple weeks and working up tolerance wearing in a few hours per day. Pt tolerated standing and transfers with assist for safety and cues. Education on HEP. Pt's sister visiting and planning to stay until Thanksgiving. Highly motivated and cooperative. Good CIR candidate. Pt would benefit from acute PT to improve transfers, w/c training, gait and mobility so pt can maximize independence and return to PLOF.    Follow Up Recommendations CIR;Supervision/Assistance - 24 hour    Equipment Recommendations  Other (comment) (pending dispo. Will need RW, w/c and possibly BSC if dispo is home. Defer to CIR)    Recommendations for Other Services OT consult     Precautions / Restrictions Precautions Precautions: Fall Precaution Comments: Prior R BKA. Restrictions Weight Bearing Restrictions: Yes LLE Weight Bearing: Non weight bearing      Mobility  Bed Mobility               General bed mobility comments: Received sitting in chair upon PT arrival.   Transfers Overall transfer level: Needs assistance Equipment used: Rolling walker (2 wheeled) Transfers: Sit to/from [employer] Sit to Stand: Mod assist   Squat pivot transfers: Min guard     General transfer comment: Mod A to rise/steadying from recliner, took multiple attempts. R prosthesis donned. VC's for hip extension/upright. Squat pivot transfer bed<->chair Min guard for safety, increased  time.  Ambulation/Gait Ambulation/Gait assistance: Min assist   Assistive device: Rolling walker (2 wheeled) Gait Pattern/deviations: Trunk flexed     General Gait Details: Pt able to hop x2 with RW for support. Pt anxious about using RW due to fall after first surgery while in hospital.   Stairs            Wheelchair Mobility    Modified Rankin (Stroke Patients Only)       Balance Overall balance assessment: Needs assistance   Sitting balance-Leahy Scale: Good Sitting balance - Comments: Able to donn prosthesis in sitting reaching outside BoS without difficulty.    Standing balance support: During functional activity;Bilateral upper extremity supported Standing balance-Leahy Scale: Poor Standing balance comment: Requires BUE support on RW for safety/balance and Min guard assist for steadying. Pt anxious about using RW.                             Pertinent Vitals/Pain Pain Assessment: 0-10 Pain Score:  (not rated on pain scale.) Pain Location: left stump Pain Descriptors / Indicators: Sore Pain Intervention(s): Monitored during session;Repositioned    Home Living Family/patient expects to be discharged to:: Private residence Living Arrangements: Alone Available Help at Discharge: Family;Other (Comment) (Pt reports her sister will be here until Thanksgiving.) Type of Home: Apartment Home Access: Level entry     Home Layout: One level Home Equipment: Transport chair;Cane - quad Additional Comments: right prosthesis (pt got a new one within the last 2 weeks prior to moving from [place]).    Prior Function Level of Independence: Independent with assistive device(s)  Comments: Uses quad cane PRN.     Hand Dominance        Extremity/Trunk Assessment   Upper Extremity Assessment: Overall WFL for tasks assessed           Lower Extremity Assessment: RLE deficits/detail;LLE deficits/detail RLE Deficits / Details: Grossly ~4/5  throughout hip/knee, ankle AROM not assessed due to R BKA= premorbidly LLE Deficits / Details: Able to perform quad set; Decreased knee flexion AROM possibly secondary to bandage. Grossly ~3+/5-4/5 throughout hip/knee. New BKA- ankle AROM not assessed.     Communication   Communication: No difficulties  Cognition Arousal/Alertness: Awake/alert Behavior During Therapy: WFL for tasks assessed/performed Overall Cognitive Status: Within Functional Limits for tasks assessed                      General Comments General comments (skin integrity, edema, etc.): MIld edema present L stump.    Exercises General Exercises - Lower Extremity Quad Sets: Left;5 reps;Supine (long sitting.) Gluteal Sets: Both;10 reps;Seated      Assessment/Plan    PT Assessment Patient needs continued PT services  PT Diagnosis Acute pain;Generalized weakness;Difficulty walking   PT Problem List Pain;Decreased strength;Decreased safety awareness;Decreased balance;Decreased mobility  PT Treatment Interventions Balance training;Gait training;Neuromuscular re-education;Patient/family education;Functional mobility training;Therapeutic activities;Therapeutic exercise;Wheelchair mobility training   PT Goals (Current goals can be found in the Care Plan section) Acute Rehab PT Goals Patient Stated Goal: to return to PLOF PT Goal Formulation: With patient Time For Goal Achievement: 11/13/14 Potential to Achieve Goals: Good    Frequency Min 3X/week   Barriers to discharge Decreased caregiver support Pt lives alone- sister will be here until Thanksgiving.    Co-evaluation               End of Session Equipment Utilized During Treatment: Gait belt Activity Tolerance: Patient tolerated treatment well Patient left: in chair;with call bell/phone within reach (notified to inform RN when ready to return to bed.) Nurse Communication: Mobility status;Precautions         Time: 7322-0254 PT Time  Calculation (min): 24 min   Charges:   PT Evaluation $Initial PT Evaluation Tier I: 1 Procedure PT Treatments $Therapeutic Activity: 8-22 mins   PT G CodesCandy Sledge A 10/30/2014, 12:41 PM Candy Sledge, [place], DPT [phone]

## 2014-10-30 NOTE — Progress Notes (Signed)
Inpatient Diabetes Program Recommendations  AACE/ADA: New Consensus Statement on Inpatient Glycemic Control (2013)  Target Ranges:  Prepandial:   less than 140 mg/dL      Peak postprandial:   less than 180 mg/dL (1-2 hours)      Critically ill patients:  140 - 180 mg/dL     Results for Diana Coleman, Diana Coleman (MRN [id number]) as of 10/30/2014 08:04  Ref. Range 10/30/2014 04:34  Glucose Latest Range: 70-99 mg/dL 49 (L)      Current Orders: Lantus 36 units QHS     Novolog Moderate SSI tid ac + HS   Hypoglycemic on AM lab glucose this AM after receiving Lantus 36 units the night prior.    MD- Please decrease Lantus to 30 units QHS    Will follow [name] RN, MSN, CDE Diabetes Coordinator Inpatient Diabetes Program Team Pager: [phone] (8a-10p)

## 2014-10-30 NOTE — Plan of Care (Signed)
Problem: Phase I Progression Outcomes Goal: OOB as tolerated unless otherwise ordered Outcome: Completed/Met Date Met:  10/30/14

## 2014-10-30 NOTE — Clinical Social Work Placement (Addendum)
Clinical Social Work Department CLINICAL SOCIAL WORK PLACEMENT NOTE 10/30/2014  Patient:  SHAKIYLA, KOOK  Account Number:  [id number] Admit date:  10/24/2014  Clinical Social Worker:  Delrae Sawyers  Date/time:  10/30/2014 03:07 PM  Clinical Social Work is seeking post-discharge placement for this patient at the following level of care:   SKILLED NURSING   (*CSW will update this form in Epic as items are completed)   10/30/2014  Patient/family provided with [facility] Department of Clinical Social Work's list of facilities offering this level of care within the geographic area requested by the patient (or if unable, by the patient's family).  10/30/2014  Patient/family informed of their freedom to choose among providers that offer the needed level of care, that participate in Medicare, Medicaid or managed care program needed by the patient, have an available bed and are willing to accept the patient.  10/30/2014  Patient/family informed of [facility]' ownership interest in [facility], as well as of the fact that they are under no obligation to receive care at this facility.  PASARR submitted to EDS on 10/30/2014 PASARR number received on 10/30/2014  FL2 transmitted to all facilities in geographic area requested by pt/family on  10/30/2014 FL2 transmitted to all facilities within larger geographic area on   Patient informed that his/her managed care company has contracts with or will negotiate with  certain facilities, including the following:     Patient/family informed of bed offers received:  11/02/2014 Patient chooses bed at [facility] Physician recommends and patient chooses bed at    Patient to be transferred to  [facility] on  11/02/2014 Patient to be transferred to facility by [facility] Patient and family notified of transfer on 11/02/2014 Name of family member notified:  Pt's granddaughter, Hollie Salk, updated  regarding discharge.  The following physician request were entered in Epic:   Additional Comments:  [facility] [place] [phone]) Licensed Clinical Social Worker Orthopedics [phone]) and Surgical [phone])

## 2014-10-30 NOTE — Progress Notes (Signed)
PATIENT DETAILS Name: Diana Coleman Age: 63 y.o. Sex: female Date of Birth: [birth year] Admit Date: 10/24/2014 Admitting Physician Geradine Girt, DO VPX:TGGYI,RSWNI J, MD  Subjective: Patient is doing well and has no complaints. Hypoglycemic this am  Assessment/Plan: Active Problems:   Left Diabetic Foot- Secondary underlying Diabetic Charcot's foot. Leukocytosis improving as of 11/08 recheck 11/09 am. Continue empiric Abx. MRI foot positive for Osteo-underwent transtibial amputation on 11/8. Patient tolerated surgery well. Wound care/Ortho following. Blood cultures on 11/3 negative. Stop Abx on 11/19   Nausea/Vomiting: Occurred on 11/7, resolved with supportive care. X-ray abdomen negative for acute abnormalities. Patient tolerated her dinner last night with no problems.   OEV:OJJKKX secondary to above, continue with IVF for 12 more hours.Avoid Nephrotoxic agents-stop IV Abx.   Diabetes with Hypoglycemia: Moderate control. Patient had one low reading this am (69) which has improved (104), Decrease Lantus to 30 units, continue SSI. Continue to hold oral hypoglycemics.   HTN: Moderately controlled, Resume Lisinopril when oral intake better. Use prn Hydralazine until then.    GERD: Continued outpatient Famotidine 20 mg.    S/p Right AKA-in 2004  Disposition: Remain inpatient  Antibiotics:  IV Vanco 11/3>>  IV Zosyn 11/3>>  DVT Prophylaxis: Heparin   Code Status: Full code  Family Communication None at bedside, patient understands treatment plan  Procedures:  None  CONSULTS:  orthopedic surgery  MEDICATIONS: Scheduled Meds: . docusate sodium  100 mg Oral BID  . famotidine  20 mg Oral Daily  . feeding supplement (PRO-STAT SUGAR FREE 64)  30 mL Oral BID BM  . insulin aspart  0-15 Units Subcutaneous TID WC  . insulin aspart  0-5 Units Subcutaneous QHS  . insulin glargine  36 Units Subcutaneous QHS  . polyethylene glycol  17 g Oral Daily  .  senna-docusate  2 tablet Oral BID   Continuous Infusions: . sodium chloride 100 mL/hr at 10/30/14 0207   PRN Meds:.acetaminophen **OR** acetaminophen, bisacodyl, hydrALAZINE, HYDROmorphone (DILAUDID) injection, methocarbamol **OR** methocarbamol (ROBAXIN)  IV, metoCLOPramide **OR** metoCLOPramide (REGLAN) injection, ondansetron **OR** ondansetron (ZOFRAN) IV, ondansetron **OR** ondansetron (ZOFRAN) IV, oxyCODONE-acetaminophen, traMADol  Antibiotics: Anti-infectives    Start     Dose/Rate Route Frequency Ordered Stop   10/25/14 0500  vancomycin (VANCOCIN) IVPB 1000 mg/200 mL premix  Status:  Discontinued     1,000 mg200 mL/hr over 60 Minutes Intravenous Every 12 hours 10/24/14 1517 10/30/14 0716   10/25/14 0000  piperacillin-tazobactam (ZOSYN) IVPB 3.375 g  Status:  Discontinued     3.375 g12.5 mL/hr over 240 Minutes Intravenous Every 8 hours 10/24/14 1517 10/30/14 0716   10/24/14 1300  piperacillin-tazobactam (ZOSYN) IVPB 3.375 g     3.375 g100 mL/hr over 30 Minutes Intravenous  Once 10/24/14 1256 10/24/14 1718   10/24/14 1300  vancomycin (VANCOCIN) IVPB 1000 mg/200 mL premix     1,000 mg200 mL/hr over 60 Minutes Intravenous  Once 10/24/14 1256 10/24/14 1939       PHYSICAL EXAM: Vital signs in last 24 hours: Filed Vitals:   10/29/14 1338 10/29/14 2134 10/30/14 0212 10/30/14 0556  BP: 168/78 151/76 148/84 134/70  Pulse: 80 73 85 79  Temp: 98.4 F (36.9 C) 98.6 F (37 C) 99.1 F (37.3 C) 97.4 F (36.3 C)  TempSrc: Oral Oral Oral Oral  Resp: 16 16 16 15   Height:      Weight:      SpO2: 99% 94% 94% 100%    Weight change:  Filed Weights   10/24/14 1300 10/29/14 1044  Weight: 83.008 kg (183 lb) 81.874 kg (180 lb 8 oz)   Body mass index is 28.26 kg/(m^2).   Gen Exam: Patient is resting comfortably in bed, Awake and alert with clear speech.   Neck: Supple, No JVD.   Chest: B/L Clear.  No rales or rhonchi CVS: S1 S2 Regular, no murmurs.  Abdomen: Soft, BS +, non tender, non  distended.  Extremities:Now Transtibial amputation, wound dressing is dry, s/p Right AKA Neurologic: Non Focal.   Skin: No Rash.    Intake/Output from previous day:  Intake/Output Summary (Last 24 hours) at 10/30/14 1006 Last data filed at 10/30/14 0557  Gross per 24 hour  Intake 1611.67 ml  Output      0 ml  Net 1611.67 ml     LAB RESULTS: CBC  Recent Labs Lab 10/24/14 1400 10/25/14 0430 10/26/14 0400 10/27/14 0433 10/29/14 0510  WBC 15.1* 15.5* 14.7* 13.6* 14.8*  HGB 12.3 10.7* 10.5* 9.7* 10.7*  HCT 36.8 32.6* 32.2* 29.6* 32.6*  PLT 319 287 308 367 484*  MCV 83.6 83.2 85.4 84.3 84.9  MCH 28.0 27.3 27.9 27.6 27.9  MCHC 33.4 32.8 32.6 32.8 32.8  RDW 12.5 12.5 12.8 12.9 13.1  LYMPHSABS 1.0  --   --   --   --   MONOABS 1.9*  --   --   --   --   EOSABS 0.0  --   --   --   --   BASOSABS 0.0  --   --   --   --     Chemistries   Recent Labs Lab 10/24/14 1400 10/25/14 0430 10/26/14 0400 10/29/14 0510 10/30/14 0434  NA 131* 136* 143 143 145  K 4.6 4.0 3.8 3.7 3.5*  CL 92* 99 107 103 106  CO2 22 24 25 28 27   GLUCOSE 440* 228* 129* 133* 49*  BUN 17 15 9 13 16   CREATININE 0.84 0.91 0.90 1.21* 1.64*  CALCIUM 9.1 8.7 8.8 8.5 7.9*    CBG:  Recent Labs Lab 10/29/14 1157 10/29/14 1709 10/29/14 2135 10/30/14 0750 10/30/14 0809  GLUCAP 122* 79 206* 69* 104*    GFR Estimated Creatinine Clearance: 37.6 mL/min (by C-G formula based on Cr of 1.64).   MICROBIOLOGY: Recent Results (from the past 240 hour(s))  Culture, blood (routine x 2)     Status: None   Collection Time: 10/24/14  3:35 PM  Result Value Ref Range Status   Specimen Description BLOOD LEFT HAND  Final   Special Requests BOTTLES DRAWN AEROBIC ONLY 10CC  Final   Culture  Setup Time   Final    10/24/2014 21:28 Performed at [facility]   Final    NO GROWTH 5 DAYS Performed at [facility]    Report Status 10/30/2014 FINAL  Final  Culture, blood (routine x 2)      Status: None   Collection Time: 10/24/14  3:45 PM  Result Value Ref Range Status   Specimen Description BLOOD RIGHT ARM  Final   Special Requests BOTTLES DRAWN AEROBIC ONLY [place]  Final   Culture  Setup Time   Final    10/24/2014 21:28 Performed at [facility]   Final    NO GROWTH 5 DAYS Performed at [facility]    Report Status 10/30/2014 FINAL  Final    RADIOLOGY STUDIES/RESULTS: Mr Foot Left W Wo  Contrast  10/28/2014   CLINICAL DATA:  Diabetic foot ulcer for 1 week.  EXAM: MRI OF THE LEFT FOREFOOT WITHOUT AND WITH CONTRAST  TECHNIQUE: Multiplanar, multisequence MR imaging was performed both before and after administration of intravenous contrast.  CONTRAST:  74mL MULTIHANCE GADOBENATE DIMEGLUMINE 529 MG/ML IV SOLN  COMPARISON:  Radiographs 10/24/2014  FINDINGS: Severe deformity of the foot due to Charcot changes. The midfoot is fused. Marked pes planus with rocker bottom foot appearance. The talus is in a vertical orientation. There are surgical changes involving the first metatarsal and second proximal phalanx. No findings to suggest septic arthritis.  There is subcutaneous soft tissue swelling/edema consistent with cellulitis. No significant findings for myofasciitis. There is an open wound on the plantar aspect of the foot with an associated soft tissue abscess measuring 3.4 x 2.5 cm. There is also osteomyelitis involving the base of the fifth metatarsal with diffuse marrow edema and enhancement.  IMPRESSION: 1. Severe chronic Charcot foot. 2. Open wound on the plantar aspect of the midfoot with underline abscess. Diffuse cellulitis is also noted. 3. Osteomyelitis involving the base of the fifth metatarsal.   Electronically Signed   By: Kalman Jewels M.D.   On: 10/28/2014 08:22   Dg Abd 2 Views  10/28/2014   CLINICAL DATA:  Vomiting  EXAM: ABDOMEN - 2 VIEW  COMPARISON:  None.  FINDINGS: Nonobstructive bowel gas pattern.  No evidence of free air on the lateral  decubitus view.  Degenerative changes of the lower lumbar spine.  Calcified uterine fibroid.  IMPRESSION: No evidence of small bowel obstruction or free air.   Electronically Signed   By: Julian Hy M.D.   On: 10/28/2014 19:18   Dg Foot Complete Left  10/24/2014   CLINICAL DATA:  63 year old female with left foot ulcer and swelling for 5 days. History of diabetes intraconal foot.  EXAM: LEFT FOOT - COMPLETE 3+ VIEW  COMPARISON:  None.  FINDINGS: Extensive deformity within the midfoot and hindfoot noted compatible with Charcot type changes.  Surgical screws within the second toe proximal phalanx and first metatarsal noted.  Soft tissue swelling is present.  No definite radiographic evidence of osteomyelitis or acute bony injury noted.  Flattening of the foot is present.  IMPRESSION: Soft tissue swelling without definite acute bony abnormality or radiographic evidence of osteomyelitis.  Extensive deformity within the midfoot and hindfoot compatible with Charcot type changes.  If there is strong clinical concern for acute osteomyelitis, consider MRI.   Electronically Signed   By: Hassan Rowan M.D.   On: 10/24/2014 16:27    Nani Ravens, PA-s [facility] Pager:[phone]  If 7PM-7AM, please contact night-coverage [email or web address] Password [email or web address] 10/30/2014, 10:06 AM   LOS: 6 days   Attending Patient was seen, examined,treatment plan was discussed with the  Advance Practice Provider.  I have directly reviewed the clinical findings, lab, imaging studies and management of this patient in detail. I have made the necessary changes to the above noted documentation, and agree with the documentation, as recorded by the Advance Practice Provider.   Mild ARF today, also with hypoglycemia today. Continue with IVF, decrease Lantus to 30 units.Follow. Discharge planning started.  Nena Alexander MD [facility].

## 2014-10-30 NOTE — Consult Note (Signed)
Physical Medicine and Rehabilitation Consult Reason for Consult:left transtibial amputation Referring Physician: [facility]   HPI: Diana Coleman is a 63 y.o.right handed female with history of diabetes mellitus with peripheral neuropathy, right BKA 2004 performed in [place] and Charcot left foot.Patient lives alone in a handicapped apartment recently received a new right prosthesis per [employer] prost[employer]. Presented 10/26/2014 with ulceration left lateral foot with draining large amount of pus. MRI of left foot with severe chronic Charcot foot open wound plantar aspect with diffuse cellulitis osteomyelitis of the fifth metatarsal. Limb was not felt to be salvageable. Underwent left transtibial amputation on 10/29/2014 per Dr. Sharol Given. Hospital course pain management. Physical and occupational therapy evaluations pending. M.D. Has requested physical medicine rehabilitation consult.   Review of Systems  Gastrointestinal: Positive for constipation.  Musculoskeletal: Positive for myalgias and joint pain.  All other systems reviewed and are negative.  Past Medical History  Diagnosis Date  . Type II diabetes mellitus   . Charcot foot due to diabetes mellitus   . Cellulitis of foot, left 10/24/2014    hx/notes 10/24/2014  . Osteomyelitis of right foot     hx/notes 10/24/2014   Past Surgical History  Procedure Laterality Date  . Above knee leg amputation Right 2004  . Tubal ligation  1973  . Reduction mammaplasty Bilateral 1987  . Foot surgery Left 1980's    "ulcer removed"  . Toe amputation Left ~ 2011    "top of my 3rd toe"   Family History  Problem Relation Age of Onset  . Uterine cancer Mother   . Gout Father   . Thyroid disease Sister    Social History:  reports that she has quit smoking. Her smoking use included Cigarettes. She has a 15 pack-year smoking history. She has never used smokeless tobacco. She reports that she does not drink alcohol or use illicit drugs. Allergies:    Allergies  Allergen Reactions  . Clindamycin/Lincomycin Itching  . Codeine Itching   Medications Prior to Admission  Medication Sig Dispense Refill  . Dulaglutide (TRULICITY) 1.5 EX/5.2WU SOPN Inject 1.5 mg into the skin once a week. On Wednesdays    . famotidine (PEPCID) 20 MG tablet Take 20 mg by mouth daily.   2  . ibuprofen (ADVIL,MOTRIN) 200 MG tablet Take 400 mg by mouth daily as needed (pain).    . Insulin Glargine (TOUJEO SOLOSTAR) 300 UNIT/ML SOPN Inject 30 Units into the skin at bedtime.    Marland Kitchen LANTUS SOLOSTAR 100 UNIT/ML Solostar Pen   2  . lisinopril (PRINIVIL,ZESTRIL) 5 MG tablet Take 5 mg by mouth daily.   4  . metFORMIN (GLUCOPHAGE) 1000 MG tablet Take 1,000 mg by mouth 2 (two) times daily with a meal.   2  . TRULICITY 1.5 XL/2.4MW SOPN Inject 1.5 mg into the skin once a week. On Wednesdays  10    Home: Home Living Family/patient expects to be discharged to:: Private residence Living Arrangements: Alone  Functional History:   Functional Status:  Mobility:          ADL:    Cognition: Cognition Orientation Level: Oriented X4    Blood pressure 134/70, pulse 79, temperature 97.4 F (36.3 C), temperature source Oral, resp. rate 15, height 5\' 7"  (1.702 m), weight 81.874 kg (180 lb 8 oz), SpO2 100 %. Physical Exam  Vitals reviewed. Constitutional: She is oriented to person, place, and time.  HENT:  Head: Normocephalic.  Eyes: EOM are normal.  Neck: Normal range  of motion. Neck supple. No thyromegaly present.  Cardiovascular: Normal rate and regular rhythm.   Respiratory: Effort normal and breath sounds normal. No respiratory distress.  GI: Soft. Bowel sounds are normal. She exhibits no distension.  Neurological: She is alert and oriented to person, place, and time.  Skin:  Right BKA site well-healed. Left BKA site is dressed appropriately tender    Results for orders placed or performed during the hospital encounter of 10/24/14 (from the past 24 hour(s))   Glucose, capillary     Status: None   Collection Time: 10/29/14  8:44 AM  Result Value Ref Range   Glucose-Capillary 83 70 - 99 mg/dL  Glucose, capillary     Status: None   Collection Time: 10/29/14 10:10 AM  Result Value Ref Range   Glucose-Capillary 92 70 - 99 mg/dL  Glucose, capillary     Status: Abnormal   Collection Time: 10/29/14 11:57 AM  Result Value Ref Range   Glucose-Capillary 122 (H) 70 - 99 mg/dL  Glucose, capillary     Status: None   Collection Time: 10/29/14  5:09 PM  Result Value Ref Range   Glucose-Capillary 79 70 - 99 mg/dL  Glucose, capillary     Status: Abnormal   Collection Time: 10/29/14  9:35 PM  Result Value Ref Range   Glucose-Capillary 206 (H) 70 - 99 mg/dL   Comment 1 Notify RN    Dg Abd 2 Views  10/28/2014   CLINICAL DATA:  Vomiting  EXAM: ABDOMEN - 2 VIEW  COMPARISON:  None.  FINDINGS: Nonobstructive bowel gas pattern.  No evidence of free air on the lateral decubitus view.  Degenerative changes of the lower lumbar spine.  Calcified uterine fibroid.  IMPRESSION: No evidence of small bowel obstruction or free air.   Electronically Signed   By: Julian Hy M.D.   On: 10/28/2014 19:18    Assessment/Plan: Diagnosis: left BKA 1. Does the need for close, 24 hr/day medical supervision in concert with the patient's rehab needs make it unreasonable for this patient to be served in a less intensive setting? Yes 2. Co-Morbidities requiring supervision/potential complications: dm, htn, gerd 3. Due to bladder management, bowel management, safety, skin/wound care, disease management, medication administration and pain management, does the patient require 24 hr/day rehab nursing? Yes 4. Does the patient require coordinated care of a physician, rehab nurse, PT (1-2 hrs/day, 5 days/week) and OT (1-2 hrs/day, 5 days/week) to address physical and functional deficits in the context of the above medical diagnosis(es)? Yes Addressing deficits in the following areas:  balance, endurance, locomotion, strength, transferring, bowel/bladder control, bathing, dressing, feeding, grooming and toileting 5. Can the patient actively participate in an intensive therapy program of at least 3 hrs of therapy per day at least 5 days per week? Yes 6. The potential for patient to make measurable gains while on inpatient rehab is good 7. Anticipated functional outcomes upon discharge from inpatient rehab are modified independent  with PT, modified independent with OT, n/a with SLP. 8. Estimated rehab length of stay to reach the above functional goals is: ?7-10 days 9. Does the patient have adequate social supports to accommodate these discharge functional goals? Potentially 10. Anticipated D/C setting: Home 11. Anticipated post D/C treatments: [facility] therapy 12. Overall Rehab/Functional Prognosis: excellent  RECOMMENDATIONS: This patient's condition is appropriate for continued rehabilitative care in the following setting: CIR Patient has agreed to participate in recommended program. Potentially Note that insurance prior authorization may be required for reimbursement for recommended  care.  Comment: Pt states that she prefers to go home. Has nobody apparently to be there after Thanksgiving. I think she would benefit from our program. Therapy evals today   Meredith Staggers, MD, [facility] 10/30/2014     10/30/2014

## 2014-10-30 NOTE — Progress Notes (Signed)
Rehab admissions - I met with pt and her daughter in follow up to rehab MD consult to explain the possibility of inpatient rehab. Pt and her daughter are interested in pursuing inpatient rehab. Questions were answered and informational brochures were given.  I did explain that we have limited bed availability and possible inpatient rehab admit would be dependent on her medical clearance and our bed availability.  I will follow pt's case and check on pt's status tomorrow.   Please call me with any questions. Thanks.  Nanetta Batty, PT Rehabilitation Admissions Coordinator [phone]

## 2014-10-30 NOTE — Clinical Social Work Psychosocial (Addendum)
Clinical Social Work Department BRIEF PSYCHOSOCIAL ASSESSMENT 10/30/2014  Patient:  HELEENA, MICELI     Account Number:  [id number]     Admit date:  10/24/2014  Clinical Social Worker:  Delrae Sawyers  Date/Time:  10/30/2014 03:00 PM  Referred by:  Physician  Date Referred:  10/30/2014 Referred for  SNF Placement   Other Referral:   CIR.   Interview type:  Patient Other interview type:   Pt's granddaughters also present at bedside.    PSYCHOSOCIAL DATA Living Status:  ALONE Admitted from facility:   Level of care:   Primary support name:  Hollie Salk Primary support relationship to patient:  FAMILY Degree of support available:   Primary supports:    1. Hollie Salk (granddaughter)  [phone]    2. Mackey Birchwood (granddaughter)  [phone]    CURRENT CONCERNS Current Concerns  Post-Acute Placement   Other Concerns:   none.    SOCIAL WORK ASSESSMENT / PLAN CSW received referral for possible SNF placement as an alternative discharge disposition to CIR. CSW met with pt and pt's granddaughters at bedside to discuss discharge disposition. Pt and pt's family stated they would prefer for pt to be discharged to CIR versus SNF. Pt and pt's family agreeable to SNF as an alternative plan to CIR. Pt reported pt is from home alone and plans to return home once completing rehabilitation.    CSW to continue to follow and assist with discharge planning needs, as needed.   Assessment/plan status:  Psychosocial Support/Ongoing Assessment of Needs Other assessment/ plan:   none.   Information/referral to community resources:   [facility] bed offers.    PATIENT'S/FAMILY'S RESPONSE TO PLAN OF CARE: Pt and pt's family understanding and agreeable to CSW plan of care. Pt and pt's family expressed preference for CIR over SNF.       Lubertha Sayres, [place] ([phone]) Licensed Clinical Social Worker Orthopedics [phone]) and Surgical [phone])

## 2014-10-30 NOTE — Progress Notes (Signed)
Patient ID: Diana Coleman, female   DOB: 02/14/1951, 63 y.o.   MRN: [id number] Postoperative day 1 transtibial amputation for osteomyelitis Charcot collapse left foot. Anticipate discharge to skilled nursing.

## 2014-10-30 NOTE — Progress Notes (Signed)
Hypoglycemic Event  CBG: 69  Treatment: 15 GM carbohydrate snack  Symptoms: None  Follow-up CBG: Time:0809 CBG Result:104  Possible Reasons for Event: Medication regimen: Received 36 units of Lantus + 2 Units Novalog at bedtime  Comments/MD notified: Discussed with Dr. Sloan Leiter, he will adjust insulin    Diana Coleman D  Remember to initiate Hypoglycemia Order Set & complete

## 2014-10-30 NOTE — Progress Notes (Signed)
NUTRITION FOLLOW UP  Intervention:   - D/c 30 ml Prostat BID, which will provide 200 kcals and 30 grams protein due to poor acceptance  Nutrition Dx:   Increased nutrient needs related to wound healing as evidenced by estimated nutritional needs, charcot infection.   Goal:   Pt will meet >90% of estimated nutritional needs  Monitor:   PO/supplement intake, labs, weight changes, I/O's  Assessment:   Diana Coleman is a 63 y.o. female who moved from [place]. She is a diabetic that is currently better controlled then she was in the past. She had a right BKA in 2004 after osteomyelitis of the foot. She presented to her doctors office after developing a wound on her left lateral foot (2x2). Her appetite has been decreased, she has had fever. About 1 week ago she got a new shoe for her foot deformed by charcot disease.   S/p transtibial amputation for osteomyelitis Charcot collapse left foot on 10/29/14.  Meal completion remains good; PO: 75-100%. However, noted pt has been refusing Prostat. Will d/c due to poor acceptance.  Discharge disposition is for SNF.  Labs reviewed. Na now WDL. K: 3.5. Creat: 1.64, Calcium: 7.9, Glucose: 49. CBGS: 69-206.   Height: Ht Readings from Last 1 Encounters:  10/24/14 5\' 7"  (1.702 m)    Weight Status:   Wt Readings from Last 1 Encounters:  10/29/14 180 lb 8 oz (81.874 kg)   10/24/14 183 lb (83.008 kg)   Re-estimated needs:  Kcal: 1800-2000 Protein: 90-100 grams Fluid: 1.8-2.0 L  Skin: closed lt leg incision, rt BKA, lt transtibial amputation  Diet Order: Diet Carb Modified   Intake/Output Summary (Last 24 hours) at 10/30/14 1041 Last data filed at 10/30/14 1024  Gross per 24 hour  Intake 1851.67 ml  Output      0 ml  Net 1851.67 ml    Last BM: 10/28/14   Labs:   Recent Labs Lab 10/26/14 0400 10/29/14 0510 10/30/14 0434  NA 143 143 145  K 3.8 3.7 3.5*  CL 107 103 106  CO2 25 28 27   BUN 9 13 16   CREATININE 0.90 1.21*  1.64*  CALCIUM 8.8 8.5 7.9*  GLUCOSE 129* 133* 49*    CBG (last 3)   Recent Labs  10/29/14 2135 10/30/14 0750 10/30/14 0809  GLUCAP 206* 69* 104*    Scheduled Meds: . docusate sodium  100 mg Oral BID  . famotidine  20 mg Oral Daily  . feeding supplement (PRO-STAT SUGAR FREE 64)  30 mL Oral BID BM  . insulin aspart  0-15 Units Subcutaneous TID WC  . insulin aspart  0-5 Units Subcutaneous QHS  . insulin glargine  36 Units Subcutaneous QHS  . polyethylene glycol  17 g Oral Daily  . senna-docusate  2 tablet Oral BID    Continuous Infusions: . sodium chloride 100 mL/hr at 10/30/14 0207    Eddye Broxterman A. Jimmye Norman, RD, LDN Pager: [phone] After hours Pager: [phone]

## 2014-10-30 NOTE — Progress Notes (Signed)
Hypoglycemic Event  CBG: 61  Treatment: 15 GM carbohydrate snack  Symptoms: None  Follow-up CBG: Time: 1720 CBG Result:77  Possible Reasons for Event: Vomiting and Inadequate meal intake    Kathrine Cords D  Remember to initiate Hypoglycemia Order Set & complete

## 2014-10-31 ENCOUNTER — Encounter (HOSPITAL_COMMUNITY): Payer: Self-pay | Admitting: Orthopedic Surgery

## 2014-10-31 LAB — GLUCOSE, CAPILLARY
GLUCOSE-CAPILLARY: 103 mg/dL — AB (ref 70–99)
Glucose-Capillary: 61 mg/dL — ABNORMAL LOW (ref 70–99)
Glucose-Capillary: 69 mg/dL — ABNORMAL LOW (ref 70–99)
Glucose-Capillary: 137 mg/dL — ABNORMAL HIGH (ref 70–99)
Glucose-Capillary: 228 mg/dL — ABNORMAL HIGH (ref 70–99)
Glucose-Capillary: 63 mg/dL — ABNORMAL LOW (ref 70–99)
Glucose-Capillary: 145 mg/dL — ABNORMAL HIGH (ref 70–99)

## 2014-10-31 LAB — BASIC METABOLIC PANEL
Anion gap: 9 (ref 5–15)
BUN: 13 mg/dL (ref 6–23)
CO2: 27 mEq/L (ref 19–32)
Calcium: 8.3 mg/dL — ABNORMAL LOW (ref 8.4–10.5)
Chloride: 109 mEq/L (ref 96–112)
Creatinine, Ser: 1.4 mg/dL — ABNORMAL HIGH (ref 0.50–1.10)
GFR, EST AFRICAN AMERICAN: 45 mL/min — AB (ref >90)
GFR, EST NON AFRICAN AMERICAN: 38 mL/min — AB (ref >90)
GLUCOSE: 71 mg/dL (ref 70–99)
POTASSIUM: 4.2 meq/L (ref 3.7–5.3)
SODIUM: 145 meq/L (ref 137–147)

## 2014-10-31 MED ORDER — ALUM & MAG HYDROXIDE-SIMETH 200-200-20 MG/5ML PO SUSP
30.0000 mL | Freq: Four times a day (QID) | ORAL | Status: DC | PRN
  Administered 2014-10-31 – 2014-11-01 (×2): 30 mL via ORAL
  Filled 2014-10-31 (×2): qty 30

## 2014-10-31 MED ORDER — INSULIN GLARGINE 100 UNIT/ML ~~LOC~~ SOLN
22.0000 [IU] | Freq: Every day | SUBCUTANEOUS | Status: DC
  Administered 2014-10-31 – 2014-11-01 (×2): 22 [IU] via SUBCUTANEOUS
  Filled 2014-10-31 (×3): qty 0.22

## 2014-10-31 MED ORDER — AMLODIPINE BESYLATE 5 MG PO TABS: 5.0000 mg | ORAL_TABLET | Freq: Every day | ORAL | Status: DC

## 2014-10-31 MED ORDER — INSULIN GLARGINE 300 UNIT/ML ~~LOC~~ SOPN: 22.0000 [IU] | PEN_INJECTOR | Freq: Every day | SUBCUTANEOUS | Status: DC

## 2014-10-31 MED ORDER — FLEET ENEMA 7-19 GM/118ML RE ENEM
1.0000 | ENEMA | Freq: Once | RECTAL | Status: AC
  Administered 2014-10-31: 1 via RECTAL
  Filled 2014-10-31: qty 1

## 2014-10-31 MED ORDER — ONDANSETRON HCL 4 MG PO TABS: 4.0000 mg | ORAL_TABLET | Freq: Four times a day (QID) | ORAL | Status: DC | PRN

## 2014-10-31 MED ORDER — POLYETHYLENE GLYCOL 3350 17 G PO PACK: 17.0000 g | PACK | Freq: Every day | ORAL | Status: DC

## 2014-10-31 MED ORDER — SENNOSIDES-DOCUSATE SODIUM 8.6-50 MG PO TABS: 2.0000 | ORAL_TABLET | Freq: Two times a day (BID) | ORAL | Status: DC

## 2014-10-31 MED ORDER — INSULIN ASPART 100 UNIT/ML ~~LOC~~ SOLN: SUBCUTANEOUS | Status: DC

## 2014-10-31 MED ORDER — OXYCODONE-ACETAMINOPHEN 5-325 MG PO TABS: 1.0000 | ORAL_TABLET | Freq: Four times a day (QID) | ORAL | Status: DC | PRN

## 2014-10-31 MED ORDER — INSULIN GLARGINE 100 UNIT/ML ~~LOC~~ SOLN
26.0000 [IU] | Freq: Every day | SUBCUTANEOUS | Status: DC
  Filled 2014-10-31: qty 0.26

## 2014-10-31 NOTE — Progress Notes (Signed)
Rehab admissions - I am following pt's case and updated pt and her daughter that we have no available rehab beds today. Patient and dtr shared that Raquel Sarna, social worker will be coming in later this am to help plan for SNF. Pt and her dtr were understanding and agreed with the plan for SNF as we have no open beds.  Call me with any questions. Thanks.  Nanetta Batty, PT Rehabilitation Admissions Coordinator [phone]

## 2014-10-31 NOTE — Progress Notes (Signed)
Patient refused to discharge, claims that she does not really feel 100 % well, MD and case manager made aware.

## 2014-10-31 NOTE — Discharge Summary (Addendum)
PATIENT DETAILS Name: Diana Coleman Age: 63 y.o. Sex: female Date of Birth: 1951/11/30 MRN: [id number]. Admitting Physician: Geradine Girt, DO UKG:URKYH,CWCBJ J, MD  Admit Date: 10/24/2014 Discharge date: 10/31/2014  Recommendations for Outpatient Follow-up:  1. Follow up with Dr Sharol Given in 1 week for dressing change 2. Have decreased Lantus to 22 units-as patient has intermittent hypoglycemia in am, please continue to follow CBG's closely 3. Repeat BMET/CBC in 1 week  PRIMARY DISCHARGE DIAGNOSIS:  Active Problems:   Cellulitis   Diabetes   Charcot foot due to diabetes mellitus   Essential hypertension   GERD (gastroesophageal reflux disease)      PAST MEDICAL HISTORY: Past Medical History  Diagnosis Date  . Type II diabetes mellitus   . Charcot foot due to diabetes mellitus   . Cellulitis of foot, left 10/24/2014    hx/notes 10/24/2014  . Osteomyelitis of right foot     hx/notes 10/24/2014    DISCHARGE MEDICATIONS: Current Discharge Medication List    START taking these medications   Details  amLODipine (NORVASC) 5 MG tablet Take 1 tablet (5 mg total) by mouth daily.    insulin aspart (NOVOLOG) 100 UNIT/ML injection 0-15 Units, Subcutaneous, 3 times daily with meals CBG < 70: implement hypoglycemia protocol CBG 70 - 120: 0 units CBG 121 - 150: 2 units CBG 151 - 200: 3 units CBG 201 - 250: 5 units CBG 251 - 300: 8 units CBG 301 - 350: 11 units CBG 351 - 400: 15 units CBG > 400: call MD Qty: 10 mL, Refills: 11    ondansetron (ZOFRAN) 4 MG tablet Take 1 tablet (4 mg total) by mouth every 6 (six) hours as needed for nausea. Qty: 20 tablet, Refills: 0    oxyCODONE-acetaminophen (PERCOCET/ROXICET) 5-325 MG per tablet Take 1-2 tablets by mouth every 6 (six) hours as needed for moderate pain or severe pain. Qty: 30 tablet, Refills: 0    polyethylene glycol (MIRALAX / GLYCOLAX) packet Take 17 g by mouth daily. Qty: 14 each, Refills: 0    senna-docusate  (SENOKOT-S) 8.6-50 MG per tablet Take 2 tablets by mouth 2 (two) times daily. Qty: 60 tablet      CONTINUE these medications which have CHANGED   Details  Insulin Glargine (TOUJEO SOLOSTAR) 300 UNIT/ML SOPN Inject 22 Units into the skin at bedtime.      CONTINUE these medications which have NOT CHANGED   Details  famotidine (PEPCID) 20 MG tablet Take 20 mg by mouth daily.  Refills: 2      STOP taking these medications     Dulaglutide (TRULICITY) 1.5 SE/8.3TD SOPN      ibuprofen (ADVIL,MOTRIN) 200 MG tablet      LANTUS SOLOSTAR 100 UNIT/ML Solostar Pen      lisinopril (PRINIVIL,ZESTRIL) 5 MG tablet      metFORMIN (GLUCOPHAGE) 1000 MG tablet      TRULICITY 1.5 VV/6.1YW SOPN         ALLERGIES:   Allergies  Allergen Reactions  . Clindamycin/Lincomycin Itching  . Codeine Itching    BRIEF HPI:  See H&P, Labs, Consult and Test reports for all details in brief, patient is a 63 year old black female with long-standing history of diabetic foot, Charcot arthropathy who presented to the hospital with left foot ulcer, swelling and erythema.  CONSULTATIONS:   orthopedic surgery  PERTINENT RADIOLOGIC STUDIES: Mr Foot Left W Wo Contrast  10/28/2014   CLINICAL DATA:  Diabetic foot ulcer for 1 week.  EXAM: MRI  OF THE LEFT FOREFOOT WITHOUT AND WITH CONTRAST  TECHNIQUE: Multiplanar, multisequence MR imaging was performed both before and after administration of intravenous contrast.  CONTRAST:  27mL MULTIHANCE GADOBENATE DIMEGLUMINE 529 MG/ML IV SOLN  COMPARISON:  Radiographs 10/24/2014  FINDINGS: Severe deformity of the foot due to Charcot changes. The midfoot is fused. Marked pes planus with rocker bottom foot appearance. The talus is in a vertical orientation. There are surgical changes involving the first metatarsal and second proximal phalanx. No findings to suggest septic arthritis.  There is subcutaneous soft tissue swelling/edema consistent with cellulitis. No significant findings  for myofasciitis. There is an open wound on the plantar aspect of the foot with an associated soft tissue abscess measuring 3.4 x 2.5 cm. There is also osteomyelitis involving the base of the fifth metatarsal with diffuse marrow edema and enhancement.  IMPRESSION: 1. Severe chronic Charcot foot. 2. Open wound on the plantar aspect of the midfoot with underline abscess. Diffuse cellulitis is also noted. 3. Osteomyelitis involving the base of the fifth metatarsal.   Electronically Signed   By: Kalman Jewels M.D.   On: 10/28/2014 08:22   Dg Abd 2 Views  10/28/2014   CLINICAL DATA:  Vomiting  EXAM: ABDOMEN - 2 VIEW  COMPARISON:  None.  FINDINGS: Nonobstructive bowel gas pattern.  No evidence of free air on the lateral decubitus view.  Degenerative changes of the lower lumbar spine.  Calcified uterine fibroid.  IMPRESSION: No evidence of small bowel obstruction or free air.   Electronically Signed   By: Julian Hy M.D.   On: 10/28/2014 19:18   Dg Foot Complete Left  10/24/2014   CLINICAL DATA:  63 year old female with left foot ulcer and swelling for 5 days. History of diabetes intraconal foot.  EXAM: LEFT FOOT - COMPLETE 3+ VIEW  COMPARISON:  None.  FINDINGS: Extensive deformity within the midfoot and hindfoot noted compatible with Charcot type changes.  Surgical screws within the second toe proximal phalanx and first metatarsal noted.  Soft tissue swelling is present.  No definite radiographic evidence of osteomyelitis or acute bony injury noted.  Flattening of the foot is present.  IMPRESSION: Soft tissue swelling without definite acute bony abnormality or radiographic evidence of osteomyelitis.  Extensive deformity within the midfoot and hindfoot compatible with Charcot type changes.  If there is strong clinical concern for acute osteomyelitis, consider MRI.   Electronically Signed   By: Hassan Rowan M.D.   On: 10/24/2014 16:27     PERTINENT LAB RESULTS: CBC:  Recent Labs  10/29/14 0510  WBC  14.8*  HGB 10.7*  HCT 32.6*  PLT 484*   CMET CMP     Component Value Date/Time   NA 145 10/31/2014 0718   K 4.2 10/31/2014 0718   CL 109 10/31/2014 0718   CO2 27 10/31/2014 0718   GLUCOSE 71 10/31/2014 0718   BUN 13 10/31/2014 0718   CREATININE 1.40* 10/31/2014 0718   CALCIUM 8.3* 10/31/2014 0718   PROT 7.5 10/24/2014 1400   ALBUMIN 2.8* 10/24/2014 1400   AST 9 10/24/2014 1400   ALT <5 10/24/2014 1400   ALKPHOS 150* 10/24/2014 1400   BILITOT 0.6 10/24/2014 1400   GFRNONAA 38* 10/31/2014 0718   GFRAA 45* 10/31/2014 0718    GFR Estimated Creatinine Clearance: 44.1 mL/min (by C-G formula based on Cr of 1.4). No results for input(s): LIPASE, AMYLASE in the last 72 hours. No results for input(s): CKTOTAL, CKMB, CKMBINDEX, TROPONINI in the last 72 hours. Invalid  input(s): POCBNP No results for input(s): DDIMER in the last 72 hours. No results for input(s): HGBA1C in the last 72 hours. No results for input(s): CHOL, HDL, LDLCALC, TRIG, CHOLHDL, LDLDIRECT in the last 72 hours. No results for input(s): TSH, T4TOTAL, T3FREE, THYROIDAB in the last 72 hours.  Invalid input(s): FREET3 No results for input(s): VITAMINB12, FOLATE, FERRITIN, TIBC, IRON, RETICCTPCT in the last 72 hours. Coags: No results for input(s): INR in the last 72 hours.  Invalid input(s): PT Microbiology: Recent Results (from the past 240 hour(s))  Culture, blood (routine x 2)     Status: None   Collection Time: 10/24/14  3:35 PM  Result Value Ref Range Status   Specimen Description BLOOD LEFT HAND  Final   Special Requests BOTTLES DRAWN AEROBIC ONLY 10CC  Final   Culture  Setup Time   Final    10/24/2014 21:28 Performed at [facility]   Final    NO GROWTH 5 DAYS Performed at [facility]    Report Status 10/30/2014 FINAL  Final  Culture, blood (routine x 2)     Status: None   Collection Time: 10/24/14  3:45 PM  Result Value Ref Range Status   Specimen Description BLOOD  RIGHT ARM  Final   Special Requests BOTTLES DRAWN AEROBIC ONLY [place]  Final   Culture  Setup Time   Final    10/24/2014 21:28 Performed at [facility]   Final    NO GROWTH 5 DAYS Performed at [facility]    Report Status 10/30/2014 FINAL  Final     BRIEF HOSPITAL COURSE:  Left Diabetic Foot with osteomyelitis:Secondary underlying Diabetic Charcot's foot.patient was admitted and started on empiric vancomycin and Zosyn. She did have leukocytosis, however this started to improve following IV antibiotics. MRI of the left foot was positive for osteomyelitis, orthopedics was consulted, patient underwent a transtibial amputation on 11/8. Postoperative hospital course was relatively uncomplicated, spoke with Dr. Sharol Given on 11/10, okay for discharge, to keep dressing as is, till sees her in 1 week. If the dressing were to get dirty, okay to change. All antibiotics were stopped on 11/9, blood cultures done on admission were negative. We will continue as needed narcotics for pain.   Nausea/Vomiting: occurred intermittently throughout the patient's hospital course. Abdominal x-ray was negative. This was treated with supportive care. Resolved by the day of discharge.  EVO:JJKKXF secondary to above, creatinine peaked at 1.64, by day of discharge decreased to 1.4. Plans are to continue to avoid nephrotoxic agents, and to repeat chemistries in 1 week  Diabetes with Hypoglycemia: during this hospitalization, CBGs were persistently elevated, Lantus was increased to 36 units, however patient then started having nausea with vomiting, as a result had decreased oral intake. CBGs, especially in the mornings were on the lower side. Lantus has now been reduced to 22 units on discharge. She will be continued on sliding scale regimen. Further optimization to be done as an outpatient. We will hold her metformin and other hypoglycemic agents for now. Please resume when able.  HTN: Moderately  controlled, Will start on Amlodipine for now, assume lisinopril and creatinine completely normalizes.  GERD: Continued outpatient Famotidine 20 mg.   Constipation: continue with both MiraLAX and Senokot. Likely secondary to narcotics. Will get one dose of Fleet enema prior to discharge.  S/p Right AKA-in 2004   TODAY-DAY OF DISCHARGE:  Subjective:   Diana Coleman today has no headache,no  chest abdominal pain,no new weakness tingling or numbness, feels much better wants to go home today.   Objective:   Blood pressure 164/61, pulse 91, temperature 98.2 F (36.8 C), temperature source Oral, resp. rate 16, height 5\' 7"  (1.702 m), weight 81.874 kg (180 lb 8 oz), SpO2 100 %.  Intake/Output Summary (Last 24 hours) at 10/31/14 1214 Last data filed at 10/31/14 0944  Gross per 24 hour  Intake   1740 ml  Output      1 ml  Net   1739 ml   Filed Weights   10/24/14 1300 10/29/14 1044  Weight: 83.008 kg (183 lb) 81.874 kg (180 lb 8 oz)    Exam Awake Alert, Oriented *3, No new F.N deficits, Normal affect [place].AT,PERRAL Supple Neck,No JVD, No cervical lymphadenopathy appriciated.  Symmetrical Chest wall movement, Good air movement bilaterally, CTAB RRR,No Gallops,Rubs or new Murmurs, No Parasternal Heave +ve B.Sounds, Abd Soft, Non tender, No organomegaly appriciated, No rebound -guarding or rigidity. No Cyanosis, Clubbing or edema, No new Rash or bruise  DISCHARGE CONDITION: Stable  DISPOSITION: SNF  Addendum: Patient refused SNF-now wanting to go home with home health services.  DISCHARGE INSTRUCTIONS:    Activity:  As tolerated with Full fall precautions use walker/cane & assistance as needed  Diet recommendation: Diabetic Diet Heart Healthy diet   Discharge Instructions    Call MD for:  redness, tenderness, or signs of infection (pain, swelling, redness, odor or green/yellow discharge around incision site)    Complete by:  As directed      Diet - low sodium heart  healthy    Complete by:  As directed      Diet Carb Modified    Complete by:  As directed      Discharge wound care:    Complete by:  As directed   kEEP DRESSING AS IS FOR ONE WEEK-UNLESS IT GETS DIRTY (THEN CAN CHANGE)-PATIENT TO FOLLOW UP WITH DR DUDA IN 1 WEEK FOR DRESSING CHANGE     Increase activity slowly    Complete by:  As directed            Follow-up Information    Follow up with DUDA,MARCUS V, MD. Schedule an appointment as soon as possible for a visit in 1 week.   Specialty:  Orthopedic Surgery   Contact information:   [address] [place] [address] [phone]       Follow up with Elyn Peers, MD. Schedule an appointment as soon as possible for a visit in 1 week.   Specialty:  Family Medicine   Contact information:   [address] [place] [address] [phone]       Total Time spent on discharge equals 45 minutes.  SignedOren Binet 10/31/2014 12:14 PM

## 2014-10-31 NOTE — Clinical Social Work Note (Signed)
CSW contacted by pt's granddaughter, Marland Kitchen, regarding discharge disposition. Pt's granddaughter informed CSW pt would prefer to discharge home rather than SNF. Pt and pt's granddaughter requesting information regarding home health services. CSW updated RNCM regarding information above. CSW signing off.   Lubertha Sayres, [place] ([phone]) Licensed Clinical Social Worker Orthopedics [phone]) and Surgical [phone])

## 2014-10-31 NOTE — Progress Notes (Signed)
PT Cancellation Note  Patient Details Name: Diana Coleman MRN: [id number] DOB: Feb 11, 1951   Cancelled Treatment:    Reason Eval/Treat Not Completed: Patient at procedure or test/unavailable. Pt on BSC and asked for more time. PT to attempt later today (as schedule permits)   Hanny Elsberry 10/31/2014, 12:20 PM Pager [phone]

## 2014-10-31 NOTE — Progress Notes (Signed)
Physical Therapy Treatment Patient Details Name: Diana Coleman MRN: [id number] DOB: [birth year] Today's Date: 10/31/2014    History of Present Illness Patient is a 63 y/o female s/p left transtibial amputation. PMH s/p right BKA 2004, DM     PT Comments    See SW and Case Manager notes re: pt's desire to go home instead of to SNF. Spoke with patient re: benefits and risks of this decision. Educated patient and family on equipment needs for discharge home and recommended transport via ambulance (pt was mod assist for squat-pivot transfer with skilled PT 10/30/14 with car transfer requiring covering greater distance and will likely need sliding board--which she has never used and will need extensive education to use safely with family). Educated on wheelchair needs and anticipate the equipment company will give her a loaner chair and then have their specialist evaluate her and her needs before ordering an amputee chair for her. All equipment and transportation recommendations relayed to [place] as pt attempting to d/c home today.    Follow Up Recommendations  [facility];Supervision/Assistance - 24 hour (however no bed; pt choosing to go home with family)     Equipment Recommendations  Rolling walker with 5" wheels;3in1 (PT);Wheelchair (measurements PT);Wheelchair cushion (measurements PT) (22" wide w/c with basic cushion; 24" sliding board)    Recommendations for Other Services       Precautions / Restrictions Precautions Precautions: Fall    Mobility  Bed Mobility Overal bed mobility: Modified Independent             General bed mobility comments: rolling no rail, incr time  Transfers                 General transfer comment: reports she can do anterior & posterior BSC transfers herself (did not want to demonstrate; "I've been getting on/off the pot all day"  Ambulation/Gait             General Gait Details: deferred; pt with bowel issues (s/p enema and frequent  stools; afraid she'll be incontinent if standing attempted)   Stairs            Wheelchair Mobility    Modified Rankin (Stroke Patients Only)       Balance                                    Cognition Arousal/Alertness: Awake/alert Behavior During Therapy: Flat affect (not feeling well due to bowels) Overall Cognitive Status: Within Functional Limits for tasks assessed                      Exercises General Exercises - Lower Extremity Quad Sets: AROM;Left;10 reps;Supine;Right;5 reps Gluteal Sets: AROM;Left;10 reps;Supine;Right;5 reps (with bed fully extended to maximize hip extension) Amputee Exercises Hip ABduction/ADduction: AROM;Left;10 reps;Supine    General Comments General comments (skin integrity, edema, etc.): Approached by Nira Conn, Case Mgr re: pt now wants to go home with family support. Re-visited pt to discuss plans. Educated that she will get much less therapy at home vs SNF (as CIR currently has no beds). Daughters present in room and discussed her equipment needs, including how to mobilize pt over threshold of her door into home (although later pt agreed to ambulance transport home as car transfer will likely be mod assist and family unable to safely assist). Educated on need for bil amputee wheelchair due to "tippiness" of a regular chair.  She should get a wheelchair with [employer] if they give her a loaner chair in the interim. Discussed bathroom equipment needs and pt has a handicap accessible apartment with fold down bench for her tub/shower.      Pertinent Vitals/Pain Pain Assessment: 0-10 Pain Score: 5  Pain Location: Lt BKA Pain Intervention(s): Limited activity within patient's tolerance;RN gave pain meds during session;Repositioned    Home Living                      Prior Function            PT Goals (current goals can now be found in the care plan section) Acute Rehab PT Goals Patient Stated Goal: to  return to PLOF Progress towards PT goals: Progressing toward goals    Frequency  Min 3X/week    PT Plan Current plan remains appropriate (C[facility] has no beds, pt has chosen home with family)    Co-evaluation             End of Session   Activity Tolerance: Patient limited by fatigue;Treatment limited secondary to medical complications (Comment) (stomach upset) Patient left: in bed;with call bell/phone within reach;with family/visitor present     Time: 1441-1453 PT Time Calculation (min) (ACUTE ONLY): 12 min  Charges:  $Therapeutic Exercise: 8-22 mins $Self Care/Home Management: 8-22                    G Codes:      Diana Coleman 11/07/14, 3:08 PM Pager [phone]

## 2014-10-31 NOTE — Progress Notes (Signed)
Physical Therapy Treatment Patient Details Name: Diana Coleman MRN: [id number] DOB: [birth year] Today's Date: 10/31/2014    History of Present Illness Patient is a 63 y/o female s/p left transtibial amputation. PMH s/p right BKA 2004, DM     PT Comments    Pt not feeling well today (has been battling stomach upset-both nausea and bowels). Reports multiple transfers to [facility]. Agreeable to instruction in exercises to maintain LLE ROM, strength, and decr edema. On arrival, pt's LLE elevated ~8 inches, however blanket/pillows were under her Lt knee with knee flexed. Educated in elevation for edema with knee extended. See below for details re: exercises. Pt politely declined any further PT activities offered.    Follow Up Recommendations  [facility]IR;Supervision/Assistance - 24 hour     Equipment Recommendations  Other (comment) (defer to post-acute venue)    Recommendations for Other Services       Precautions / Restrictions Precautions Precautions: Fall Restrictions Weight Bearing Restrictions: Yes LLE Weight Bearing: Non weight bearing    Mobility  Bed Mobility                  Transfers                 General transfer comment: reports she can do anterior & posterior BSC transfers herself (did not want to demonstrate; "I've been getting on/off the pot all day"  Ambulation/Gait             General Gait Details: deferred; pt with bowel issues (s/p enema and frequent stools; afraid she'll be incontinent if standing attempted)   Stairs            Wheelchair Mobility    Modified Rankin (Stroke Patients Only)       Balance                                    Cognition Arousal/Alertness: Awake/alert Behavior During Therapy: Flat affect (not feeling well due to bowels) Overall Cognitive Status: Within Functional Limits for tasks assessed                      Exercises General Exercises - Lower Extremity Quad Sets: AROM;Left;10  reps;Supine;Right;5 reps Gluteal Sets: AROM;Left;10 reps;Supine;Right;5 reps (with bed fully extended to maximize hip extension) Amputee Exercises Hip ABduction/ADduction: AROM;Left;10 reps;Supine    General Comments        Pertinent Vitals/Pain Pain Assessment: 0-10 Pain Score: 5  Pain Location: Lt BKA Pain Intervention(s): Limited activity within patient's tolerance;RN gave pain meds during session;Repositioned    Home Living                      Prior Function            PT Goals (current goals can now be found in the care plan section) Acute Rehab PT Goals Patient Stated Goal: to return to PLOF Progress towards PT goals: Progressing toward goals    Frequency  Min 3X/week    PT Plan Current plan remains appropriate (noted currently no [facility] beds)    Co-evaluation             End of Session   Activity Tolerance: Patient limited by fatigue;Treatment limited secondary to medical complications (Comment) (stomach upset) Patient left: in bed;with call bell/phone within reach;with family/visitor present     Time: 5885-0277 PT Time Calculation (min) (ACUTE ONLY): 17  min  Charges:  $Therapeutic Exercise: 8-22 mins                    G Codes:      Lynna Zamorano 2014/11/24, 1:33 PM Pager [phone]

## 2014-11-01 ENCOUNTER — Inpatient Hospital Stay (HOSPITAL_COMMUNITY): Payer: Medicare HMO

## 2014-11-01 DIAGNOSIS — I10 Essential (primary) hypertension: Secondary | ICD-10-CM

## 2014-11-01 DIAGNOSIS — K219 Gastro-esophageal reflux disease without esophagitis: Secondary | ICD-10-CM

## 2014-11-01 DIAGNOSIS — K3184 Gastroparesis: Secondary | ICD-10-CM

## 2014-11-01 LAB — GLUCOSE, CAPILLARY
Glucose-Capillary: 100 mg/dL — ABNORMAL HIGH (ref 70–99)
Glucose-Capillary: 91 mg/dL (ref 70–99)
Glucose-Capillary: 88 mg/dL (ref 70–99)
Glucose-Capillary: 111 mg/dL — ABNORMAL HIGH (ref 70–99)

## 2014-11-01 LAB — URINALYSIS, ROUTINE W REFLEX MICROSCOPIC
Bilirubin Urine: NEGATIVE
GLUCOSE, UA: NEGATIVE mg/dL
KETONES UR: NEGATIVE mg/dL
Leukocytes, UA: NEGATIVE
Nitrite: NEGATIVE
PH: 6.5 (ref 5.0–8.0)
PROTEIN: 30 mg/dL — AB
Specific Gravity, Urine: 1.008 (ref 1.005–1.030)
Urobilinogen, UA: 0.2 mg/dL (ref 0.0–1.0)

## 2014-11-01 LAB — URINE MICROSCOPIC-ADD ON

## 2014-11-01 MED ORDER — LISINOPRIL 10 MG PO TABS
10.0000 mg | ORAL_TABLET | Freq: Every day | ORAL | Status: DC
  Administered 2014-11-01 – 2014-11-02 (×2): 10 mg via ORAL
  Filled 2014-11-01 (×2): qty 1

## 2014-11-01 MED ORDER — METOCLOPRAMIDE HCL 5 MG/ML IJ SOLN
10.0000 mg | Freq: Three times a day (TID) | INTRAMUSCULAR | Status: DC
  Administered 2014-11-01 – 2014-11-02 (×4): 10 mg via INTRAVENOUS
  Filled 2014-11-01 (×7): qty 2

## 2014-11-01 NOTE — Clinical Social Work Note (Signed)
CSW contacted by pt's granddaughter stating pt and pt's family have decided on SNF ([facility]) as discharge disposition. CSW updated RNCM regarding change in discharge disposition. [facility] updated regarding pt accepting bed offer.   CSW to continue to follow and assist with discharge planning needs once medically stable for discharge.  Lubertha Sayres, [place] [phone]) Licensed Clinical Social Worker Orthopedics [phone]) and Surgical [phone])

## 2014-11-01 NOTE — Progress Notes (Signed)
Paged [facility] MD on-call regarding patient's compliant of gas pain and elevated SBP of 201. New order received for Maalox 89mL PRN for gas pain.  Administered PRN hydralazine Inj 10mg  for SBP>180.

## 2014-11-01 NOTE — Progress Notes (Signed)
Patient complained again of gas pain.  Encouraged patient to move around to expel gas but refused due to gas pain.  Administered PRN Maalox 63mL as requested by patient.  Will continue to monitor.

## 2014-11-01 NOTE — Progress Notes (Signed)
PATIENT DETAILS Name: Diana Coleman Age: 63 y.o. Sex: female Date of Birth: Jan 08, 1951 Admit Date: 10/24/2014 Admitting Physician Geradine Girt, DO [id number] J, MD  Subjective: Patient reports feeling poorly this morning, having episode of nausea and vomiting. Patient does not feel she is ready for discharge today.   Assessment/Plan: Active Problems:   Left Diabetic Foot- Secondary underlying Diabetic Charcot's foot. Leukocytosis improving as of 11/08 recheck 11/09 am. Continue empiric Abx. MRI foot positive for Osteo-underwent transtibial amputation on 11/8. Patient tolerated surgery well. Wound care/Ortho following. Blood cultures on 11/3 negative. Stop Abx on 11/19   Nausea/Vomiting: patient having several episodes of nausea vomiting this morning, could be secondary to diabetic gastroparesis. KUB was checked which did not show evidence of bowel obstruction. Will schedule Reglan 10 mg IV 3 times a day before meals   KDT:OIZTIW secondary to above, continue with IVF for 12 more hours. Avoid Nephrotoxic agents-stop IV Abx.   Diabetes with Hypoglycemia: Moderate control. Patient had one low reading this am (69) which has improved (104), her Lantus was decreased to 22 units subcutaneous daily from 30 units subcutaneous daily   HTN: Patient's blood pressure is elevated with systolic blood pressures in the 170s to 180s, will restart lisinopril at 10 mg by mouth daily   GERD: Continued outpatient Famotidine 20 mg.    S/p Right AKA-in 2004  Disposition: Remain inpatient  Antibiotics:  IV Vanco 11/3>>  IV Zosyn 11/3>>  DVT Prophylaxis: Heparin   Code Status: Full code  Family Communication None at bedside, patient understands treatment plan  Procedures:  None  CONSULTS:  orthopedic surgery  MEDICATIONS: Scheduled Meds: . docusate sodium  100 mg Oral BID  . famotidine  20 mg Oral Daily  . heparin subcutaneous  5,000 Units Subcutaneous 3 times per day   . insulin aspart  0-15 Units Subcutaneous TID WC  . insulin aspart  0-5 Units Subcutaneous QHS  . insulin glargine  22 Units Subcutaneous QHS  . metoCLOPramide (REGLAN) injection  10 mg Intravenous TID AC & HS  . polyethylene glycol  17 g Oral Daily  . senna-docusate  2 tablet Oral BID   Continuous Infusions: . sodium chloride 100 mL/hr at 10/30/14 1300   PRN Meds:.acetaminophen **OR** acetaminophen, alum & mag hydroxide-simeth, bisacodyl, hydrALAZINE, HYDROmorphone (DILAUDID) injection, methocarbamol **OR** methocarbamol (ROBAXIN)  IV, ondansetron **OR** ondansetron (ZOFRAN) IV, ondansetron **OR** ondansetron (ZOFRAN) IV, oxyCODONE-acetaminophen, traMADol  Antibiotics: Anti-infectives    Start     Dose/Rate Route Frequency Ordered Stop   10/25/14 0500  vancomycin (VANCOCIN) IVPB 1000 mg/200 mL premix  Status:  Discontinued     1,000 mg200 mL/hr over 60 Minutes Intravenous Every 12 hours 10/24/14 1517 10/30/14 0716   10/25/14 0000  piperacillin-tazobactam (ZOSYN) IVPB 3.375 g  Status:  Discontinued     3.375 g12.5 mL/hr over 240 Minutes Intravenous Every 8 hours 10/24/14 1517 10/30/14 0716   10/24/14 1300  piperacillin-tazobactam (ZOSYN) IVPB 3.375 g     3.375 g100 mL/hr over 30 Minutes Intravenous  Once 10/24/14 1256 10/24/14 1718   10/24/14 1300  vancomycin (VANCOCIN) IVPB 1000 mg/200 mL premix     1,000 mg200 mL/hr over 60 Minutes Intravenous  Once 10/24/14 1256 10/24/14 1939       PHYSICAL EXAM: Vital signs in last 24 hours: Filed Vitals:   10/31/14 2233 11/01/14 0021 11/01/14 0604 11/01/14 1335  BP: 201/96 180/99 170/94 186/89  Pulse: 94 96 90 95  Temp:  98.1 F (36.7 C) 98.6 F (37 C)  TempSrc:    Oral  Resp: 19 18 19 18   Height:      Weight:      SpO2: 97% 99% 95% 96%    Weight change:  Filed Weights   10/24/14 1300 10/29/14 1044  Weight: 83.008 kg (183 lb) 81.874 kg (180 lb 8 oz)   Body mass index is 28.26 kg/(m^2).   Gen Exam: Patient is resting  comfortably in bed, Awake and alert with clear speech.   Neck: Supple, No JVD.   Chest: B/L Clear.  No rales or rhonchi CVS: S1 S2 Regular, no murmurs.  Abdomen: Soft, BS +, non tender, non distended.  Extremities:Now Transtibial amputation, wound dressing is dry, s/p Right AKA Neurologic: Non Focal.   Skin: No Rash.    Intake/Output from previous day:  Intake/Output Summary (Last 24 hours) at 11/01/14 1427 Last data filed at 11/01/14 1027  Gross per 24 hour  Intake    120 ml  Output    200 ml  Net    -80 ml     LAB RESULTS: CBC  Recent Labs Lab 10/26/14 0400 10/27/14 0433 10/29/14 0510  WBC 14.7* 13.6* 14.8*  HGB 10.5* 9.7* 10.7*  HCT 32.2* 29.6* 32.6*  PLT 308 367 484*  MCV 85.4 84.3 84.9  MCH 27.9 27.6 27.9  MCHC 32.6 32.8 32.8  RDW 12.8 12.9 13.1    Chemistries   Recent Labs Lab 10/26/14 0400 10/29/14 0510 10/30/14 0434 10/31/14 0718  NA 143 143 145 145  K 3.8 3.7 3.5* 4.2  CL 107 103 106 109  CO2 25 28 27 27   GLUCOSE 129* 133* 49* 71  BUN 9 13 16 13   CREATININE 0.90 1.21* 1.64* 1.40*  CALCIUM 8.8 8.5 7.9* 8.3*    CBG:  Recent Labs Lab 10/31/14 1703 10/31/14 1936 10/31/14 2249 11/01/14 0754 11/01/14 1128  GLUCAP 63* 103* 145* 100* 91    GFR Estimated Creatinine Clearance: 44.1 mL/min (by C-G formula based on Cr of 1.4).   MICROBIOLOGY: Recent Results (from the past 240 hour(s))  Culture, blood (routine x 2)     Status: None   Collection Time: 10/24/14  3:35 PM  Result Value Ref Range Status   Specimen Description BLOOD LEFT HAND  Final   Special Requests BOTTLES DRAWN AEROBIC ONLY 10CC  Final   Culture  Setup Time   Final    10/24/2014 21:28 Performed at [place]   Final    NO GROWTH 5 DAYS Performed at [facility]    Report Status 10/30/2014 FINAL  Final  Culture, blood (routine x 2)     Status: None   Collection Time: 10/24/14  3:45 PM  Result Value Ref Range Status   Specimen Description  BLOOD RIGHT ARM  Final   Special Requests BOTTLES DRAWN AEROBIC ONLY [place]  Final   Culture  Setup Time   Final    10/24/2014 21:28 Performed at [place]   Final    NO GROWTH 5 DAYS Performed at [facility]    Report Status 10/30/2014 FINAL  Final    RADIOLOGY STUDIES/RESULTS: Dg Abd 1 View  11/01/2014   CLINICAL DATA:  Mid abdominal pain, nausea and vomiting  EXAM: ABDOMEN - 1 VIEW  COMPARISON:  10/28/2014  FINDINGS: There is nonspecific nonobstructive bowel gas pattern. Mild degenerative changes lower lumbar spine. Stable ovoid calcification midline pelvis.  IMPRESSION: Nonspecific nonobstructive bowel gas pattern.   Electronically Signed   By: Lahoma Crocker M.D.   On: 11/01/2014 11:28   Mr Foot Left W Wo Contrast  10/28/2014   CLINICAL DATA:  Diabetic foot ulcer for 1 week.  EXAM: MRI OF THE LEFT FOREFOOT WITHOUT AND WITH CONTRAST  TECHNIQUE: Multiplanar, multisequence MR imaging was performed both before and after administration of intravenous contrast.  CONTRAST:  46mL MULTIHANCE GADOBENATE DIMEGLUMINE 529 MG/ML IV SOLN  COMPARISON:  Radiographs 10/24/2014  FINDINGS: Severe deformity of the foot due to Charcot changes. The midfoot is fused. Marked pes planus with rocker bottom foot appearance. The talus is in a vertical orientation. There are surgical changes involving the first metatarsal and second proximal phalanx. No findings to suggest septic arthritis.  There is subcutaneous soft tissue swelling/edema consistent with cellulitis. No significant findings for myofasciitis. There is an open wound on the plantar aspect of the foot with an associated soft tissue abscess measuring 3.4 x 2.5 cm. There is also osteomyelitis involving the base of the fifth metatarsal with diffuse marrow edema and enhancement.  IMPRESSION: 1. Severe chronic Charcot foot. 2. Open wound on the plantar aspect of the midfoot with underline abscess. Diffuse cellulitis is also noted. 3.  Osteomyelitis involving the base of the fifth metatarsal.   Electronically Signed   By: Kalman Jewels M.D.   On: 10/28/2014 08:22   Dg Abd 2 Views  10/28/2014   CLINICAL DATA:  Vomiting  EXAM: ABDOMEN - 2 VIEW  COMPARISON:  None.  FINDINGS: Nonobstructive bowel gas pattern.  No evidence of free air on the lateral decubitus view.  Degenerative changes of the lower lumbar spine.  Calcified uterine fibroid.  IMPRESSION: No evidence of small bowel obstruction or free air.   Electronically Signed   By: Julian Hy M.D.   On: 10/28/2014 19:18   Dg Foot Complete Left  10/24/2014   CLINICAL DATA:  63 year old female with left foot ulcer and swelling for 5 days. History of diabetes intraconal foot.  EXAM: LEFT FOOT - COMPLETE 3+ VIEW  COMPARISON:  None.  FINDINGS: Extensive deformity within the midfoot and hindfoot noted compatible with Charcot type changes.  Surgical screws within the second toe proximal phalanx and first metatarsal noted.  Soft tissue swelling is present.  No definite radiographic evidence of osteomyelitis or acute bony injury noted.  Flattening of the foot is present.  IMPRESSION: Soft tissue swelling without definite acute bony abnormality or radiographic evidence of osteomyelitis.  Extensive deformity within the midfoot and hindfoot compatible with Charcot type changes.  If there is strong clinical concern for acute osteomyelitis, consider MRI.   Electronically Signed   By: Hassan Rowan M.D.   On: 10/24/2014 16:27    Kelvin Cellar, PA-s [facility] Pager:[phone]  If 7PM-7AM, please contact night-coverage [email or web address] Password [email or web address] 11/01/2014, 2:27 PM   LOS: 8 days    [facility].

## 2014-11-01 NOTE — Progress Notes (Signed)
Recheck patient's BP now 180/99.  Gas pain had lowered a little bit.  Will continue to monitor patient.

## 2014-11-01 NOTE — Progress Notes (Signed)
Patient ID: Diana Coleman, female   DOB: [birth year], 62 y.o.   MRN: [id number] Transtibial amputation dressing clean and dry. Patient is anticipating discharge to skilled nursing. I will follow-up in the office in 2 weeks. Patient's dressing may be changed when necessary.

## 2014-11-02 DIAGNOSIS — E11621 Type 2 diabetes mellitus with foot ulcer: Principal | ICD-10-CM

## 2014-11-02 DIAGNOSIS — E118 Type 2 diabetes mellitus with unspecified complications: Secondary | ICD-10-CM | POA: Insufficient documentation

## 2014-11-02 DIAGNOSIS — K3184 Gastroparesis: Secondary | ICD-10-CM

## 2014-11-02 DIAGNOSIS — R112 Nausea with vomiting, unspecified: Secondary | ICD-10-CM

## 2014-11-02 DIAGNOSIS — E0869 Diabetes mellitus due to underlying condition with other specified complication: Secondary | ICD-10-CM | POA: Insufficient documentation

## 2014-11-02 LAB — CBC
HCT: 32.6 % — ABNORMAL LOW (ref 36.0–46.0)
Hemoglobin: 10.5 g/dL — ABNORMAL LOW (ref 12.0–15.0)
MCH: 27.6 pg (ref 26.0–34.0)
MCHC: 32.2 g/dL (ref 30.0–36.0)
MCV: 85.6 fL (ref 78.0–100.0)
Platelets: 628 10*3/uL — ABNORMAL HIGH (ref 150–400)
RBC: 3.81 MIL/uL — ABNORMAL LOW (ref 3.87–5.11)
RDW: 13.3 % (ref 11.5–15.5)
WBC: 14.3 10*3/uL — ABNORMAL HIGH (ref 4.0–10.5)

## 2014-11-02 LAB — BASIC METABOLIC PANEL
ANION GAP: 12 (ref 5–15)
BUN: 11 mg/dL (ref 6–23)
CALCIUM: 8.9 mg/dL (ref 8.4–10.5)
CHLORIDE: 104 meq/L (ref 96–112)
CO2: 29 mEq/L (ref 19–32)
CREATININE: 1.43 mg/dL — AB (ref 0.50–1.10)
GFR calc non Af Amer: 38 mL/min — ABNORMAL LOW (ref >90)
GFR, EST AFRICAN AMERICAN: 43 mL/min — AB (ref >90)
Glucose, Bld: 81 mg/dL (ref 70–99)
Potassium: 4 mEq/L (ref 3.7–5.3)
SODIUM: 145 meq/L (ref 137–147)

## 2014-11-02 LAB — GLUCOSE, CAPILLARY
GLUCOSE-CAPILLARY: 98 mg/dL (ref 70–99)
Glucose-Capillary: 82 mg/dL (ref 70–99)

## 2014-11-02 MED ORDER — METOCLOPRAMIDE HCL 10 MG PO TABS: 10.0000 mg | ORAL_TABLET | Freq: Three times a day (TID) | ORAL | Status: DC

## 2014-11-02 MED ORDER — LISINOPRIL 10 MG PO TABS: 10.0000 mg | ORAL_TABLET | Freq: Every day | ORAL | Status: DC

## 2014-11-02 NOTE — Progress Notes (Signed)
Report called to Ronalee Belts, RN at [facility]. Await transfer by [facility].

## 2014-11-02 NOTE — Clinical Social Work Note (Signed)
Pt to be discharged to [facility]. Pt's granddaughter, Hollie Salk, updated regarding discharge.  [place]: [phone] Transportation: [facility]) scheduled for 1pm once bed available at facility.  Lubertha Sayres, [place] [phone]) Licensed Clinical Social Worker Orthopedics [phone]) and Surgical [phone])

## 2014-11-02 NOTE — Discharge Summary (Signed)
PATIENT DETAILS Name: Diana Coleman Age: 63 y.o. Sex: female Date of Birth: 1951/11/15 MRN: [id number]. Admitting Physician: Geradine Girt, DO EQA:[id number],QQIWL J, MD  Admit Date: 10/24/2014 Discharge date: 11/02/2014  Recommendations for Outpatient Follow-up:  1. Follow up with Dr Sharol Given in 1 week for dressing change 2. Have decreased Lantus to 22 units-as patient has intermittent hypoglycemia in am, please continue to follow CBG's closely 3. Repeat BMET/CBC in 1 week 4. Please follow up on blood pressures, as she was hypertensive during this hospitalization, discharged on lisinopril and amlodipine  PRIMARY DISCHARGE DIAGNOSIS:  Principal Problem:   Charcot foot due to diabetes mellitus Active Problems:   Cellulitis   Diabetes   Essential hypertension   GERD (gastroesophageal reflux disease)   Diabetes mellitus due to underlying condition with other specified complication   Diabetic foot   Nausea & vomiting   Gastroparesis      PAST MEDICAL HISTORY: Past Medical History  Diagnosis Date  . Type II diabetes mellitus   . Charcot foot due to diabetes mellitus   . Cellulitis of foot, left 10/24/2014    hx/notes 10/24/2014  . Osteomyelitis of right foot     hx/notes 10/24/2014    DISCHARGE MEDICATIONS: Current Discharge Medication List    START taking these medications   Details  amLODipine (NORVASC) 5 MG tablet Take 1 tablet (5 mg total) by mouth daily.    insulin aspart (NOVOLOG) 100 UNIT/ML injection 0-15 Units, Subcutaneous, 3 times daily with meals CBG < 70: implement hypoglycemia protocol CBG 70 - 120: 0 units CBG 121 - 150: 2 units CBG 151 - 200: 3 units CBG 201 - 250: 5 units CBG 251 - 300: 8 units CBG 301 - 350: 11 units CBG 351 - 400: 15 units CBG > 400: call MD Qty: 10 mL, Refills: 11    metoCLOPramide (REGLAN) 10 MG tablet Take 1 tablet (10 mg total) by mouth 3 (three) times daily before meals. Qty: 90 tablet, Refills: 1    ondansetron (ZOFRAN) 4 MG  tablet Take 1 tablet (4 mg total) by mouth every 6 (six) hours as needed for nausea. Qty: 20 tablet, Refills: 0    oxyCODONE-acetaminophen (PERCOCET/ROXICET) 5-325 MG per tablet Take 1-2 tablets by mouth every 6 (six) hours as needed for moderate pain or severe pain. Qty: 30 tablet, Refills: 0    polyethylene glycol (MIRALAX / GLYCOLAX) packet Take 17 g by mouth daily. Qty: 14 each, Refills: 0    senna-docusate (SENOKOT-S) 8.6-50 MG per tablet Take 2 tablets by mouth 2 (two) times daily. Qty: 60 tablet      CONTINUE these medications which have CHANGED   Details  Insulin Glargine (TOUJEO SOLOSTAR) 300 UNIT/ML SOPN Inject 22 Units into the skin at bedtime.    lisinopril (PRINIVIL,ZESTRIL) 10 MG tablet Take 1 tablet (10 mg total) by mouth daily. Qty: 30 tablet, Refills: 1      CONTINUE these medications which have NOT CHANGED   Details  famotidine (PEPCID) 20 MG tablet Take 20 mg by mouth daily.  Refills: 2      STOP taking these medications     Dulaglutide (TRULICITY) 1.5 NL/8.9QJ SOPN      ibuprofen (ADVIL,MOTRIN) 200 MG tablet      LANTUS SOLOSTAR 100 UNIT/ML Solostar Pen      metFORMIN (GLUCOPHAGE) 1000 MG tablet      TRULICITY 1.5 JH/4.1DE SOPN         ALLERGIES:   Allergies  Allergen Reactions  .  Clindamycin/Lincomycin Itching  . Codeine Itching    BRIEF HPI:  See H&P, Labs, Consult and Test reports for all details in brief, patient is a 63 year old black female with long-standing history of diabetic foot, Charcot arthropathy who presented to the hospital with left foot ulcer, swelling and erythema.  CONSULTATIONS:   orthopedic surgery  PERTINENT RADIOLOGIC STUDIES: Dg Abd 1 View  11/01/2014   CLINICAL DATA:  Mid abdominal pain, nausea and vomiting  EXAM: ABDOMEN - 1 VIEW  COMPARISON:  10/28/2014  FINDINGS: There is nonspecific nonobstructive bowel gas pattern. Mild degenerative changes lower lumbar spine. Stable ovoid calcification midline pelvis.   IMPRESSION: Nonspecific nonobstructive bowel gas pattern.   Electronically Signed   By: Lahoma Crocker M.D.   On: 11/01/2014 11:28   Mr Foot Left W Wo Contrast  10/28/2014   CLINICAL DATA:  Diabetic foot ulcer for 1 week.  EXAM: MRI OF THE LEFT FOREFOOT WITHOUT AND WITH CONTRAST  TECHNIQUE: Multiplanar, multisequence MR imaging was performed both before and after administration of intravenous contrast.  CONTRAST:  54mL MULTIHANCE GADOBENATE DIMEGLUMINE 529 MG/ML IV SOLN  COMPARISON:  Radiographs 10/24/2014  FINDINGS: Severe deformity of the foot due to Charcot changes. The midfoot is fused. Marked pes planus with rocker bottom foot appearance. The talus is in a vertical orientation. There are surgical changes involving the first metatarsal and second proximal phalanx. No findings to suggest septic arthritis.  There is subcutaneous soft tissue swelling/edema consistent with cellulitis. No significant findings for myofasciitis. There is an open wound on the plantar aspect of the foot with an associated soft tissue abscess measuring 3.4 x 2.5 cm. There is also osteomyelitis involving the base of the fifth metatarsal with diffuse marrow edema and enhancement.  IMPRESSION: 1. Severe chronic Charcot foot. 2. Open wound on the plantar aspect of the midfoot with underline abscess. Diffuse cellulitis is also noted. 3. Osteomyelitis involving the base of the fifth metatarsal.   Electronically Signed   By: Kalman Jewels M.D.   On: 10/28/2014 08:22   Dg Abd 2 Views  10/28/2014   CLINICAL DATA:  Vomiting  EXAM: ABDOMEN - 2 VIEW  COMPARISON:  None.  FINDINGS: Nonobstructive bowel gas pattern.  No evidence of free air on the lateral decubitus view.  Degenerative changes of the lower lumbar spine.  Calcified uterine fibroid.  IMPRESSION: No evidence of small bowel obstruction or free air.   Electronically Signed   By: Julian Hy M.D.   On: 10/28/2014 19:18   Dg Foot Complete Left  10/24/2014   CLINICAL DATA:   63 year old female with left foot ulcer and swelling for 5 days. History of diabetes intraconal foot.  EXAM: LEFT FOOT - COMPLETE 3+ VIEW  COMPARISON:  None.  FINDINGS: Extensive deformity within the midfoot and hindfoot noted compatible with Charcot type changes.  Surgical screws within the second toe proximal phalanx and first metatarsal noted.  Soft tissue swelling is present.  No definite radiographic evidence of osteomyelitis or acute bony injury noted.  Flattening of the foot is present.  IMPRESSION: Soft tissue swelling without definite acute bony abnormality or radiographic evidence of osteomyelitis.  Extensive deformity within the midfoot and hindfoot compatible with Charcot type changes.  If there is strong clinical concern for acute osteomyelitis, consider MRI.   Electronically Signed   By: Hassan Rowan M.D.   On: 10/24/2014 16:27     PERTINENT LAB RESULTS: CBC:  Recent Labs  11/02/14 0438  WBC 14.3*  HGB 10.5*  HCT 32.6*  PLT 628*   CMET CMP     Component Value Date/Time   NA 145 11/02/2014 0438   K 4.0 11/02/2014 0438   CL 104 11/02/2014 0438   CO2 29 11/02/2014 0438   GLUCOSE 81 11/02/2014 0438   BUN 11 11/02/2014 0438   CREATININE 1.43* 11/02/2014 0438   CALCIUM 8.9 11/02/2014 0438   PROT 7.5 10/24/2014 1400   ALBUMIN 2.8* 10/24/2014 1400   AST 9 10/24/2014 1400   ALT <5 10/24/2014 1400   ALKPHOS 150* 10/24/2014 1400   BILITOT 0.6 10/24/2014 1400   GFRNONAA 38* 11/02/2014 0438   GFRAA 43* 11/02/2014 0438    GFR Estimated Creatinine Clearance: 43.2 mL/min (by C-G formula based on Cr of 1.43). No results for input(s): LIPASE, AMYLASE in the last 72 hours. No results for input(s): CKTOTAL, CKMB, CKMBINDEX, TROPONINI in the last 72 hours. Invalid input(s): POCBNP No results for input(s): DDIMER in the last 72 hours. No results for input(s): HGBA1C in the last 72 hours. No results for input(s): CHOL, HDL, LDLCALC, TRIG, CHOLHDL, LDLDIRECT in the last 72 hours. No  results for input(s): TSH, T4TOTAL, T3FREE, THYROIDAB in the last 72 hours.  Invalid input(s): FREET3 No results for input(s): VITAMINB12, FOLATE, FERRITIN, TIBC, IRON, RETICCTPCT in the last 72 hours. Coags: No results for input(s): INR in the last 72 hours.  Invalid input(s): PT Microbiology: Recent Results (from the past 240 hour(s))  Culture, blood (routine x 2)     Status: None   Collection Time: 10/24/14  3:35 PM  Result Value Ref Range Status   Specimen Description BLOOD LEFT HAND  Final   Special Requests BOTTLES DRAWN AEROBIC ONLY 10CC  Final   Culture  Setup Time   Final    10/24/2014 21:28 Performed at [facility]   Final    NO GROWTH 5 DAYS Performed at [facility]    Report Status 10/30/2014 FINAL  Final  Culture, blood (routine x 2)     Status: None   Collection Time: 10/24/14  3:45 PM  Result Value Ref Range Status   Specimen Description BLOOD RIGHT ARM  Final   Special Requests BOTTLES DRAWN AEROBIC ONLY [place]  Final   Culture  Setup Time   Final    10/24/2014 21:28 Performed at [facility]   Final    NO GROWTH 5 DAYS Performed at [facility]    Report Status 10/30/2014 FINAL  Final     BRIEF HOSPITAL COURSE:  Left Diabetic Foot with osteomyelitis:Secondary underlying Diabetic Charcot's foot.patient was admitted and started on empiric vancomycin and Zosyn. She did have leukocytosis, however this started to improve following IV antibiotics. MRI of the left foot was positive for osteomyelitis, orthopedics was consulted, patient underwent a transtibial amputation on 11/8. Postoperative hospital course was relatively uncomplicated, spoke with Dr. Sharol Given on 11/10, okay for discharge, to keep dressing as is, till sees her in 1 week. If the dressing were to get dirty, okay to change. All antibiotics were stopped on 11/9, blood cultures done on admission were negative. We will continue as needed narcotics for  pain.   Nausea/Vomiting: occurred intermittently throughout the patient's hospital course. Abdominal x-ray was negative. Likely secondary to diabetic gastroparesis, will be discharged on Reglan 10 mg by mouth 3 times a day  BWL:SLHTDS secondary to above, creatinine peaked at 1.64, by day of discharge decreased to 1.4. Plans are to continue  to avoid nephrotoxic agents, and to repeat chemistries in 1 week  Diabetes with Hypoglycemia: during this hospitalization, CBGs were persistently elevated, Lantus was increased to 36 units, however patient then started having nausea with vomiting, as a result had decreased oral intake. CBGs, especially in the mornings were on the lower side. Lantus has now been reduced to 22 units on discharge. She will be continued on sliding scale regimen. Further optimization to be done as an outpatient. We will hold her metformin and other hypoglycemic agents for now. Please resume when able.  HTN: Patient having elevated blood pressures for which her lisinopril was restarted at 10 mg by mouth daily. Please follow-up on blood pressures as antihypertensive agents may need titrating.  GERD: Continued outpatient Famotidine 20 mg.   Constipation: continue with both MiraLAX and Senokot. Likely secondary to narcotics. Will get one dose of Fleet enema prior to discharge.  S/p Right AKA-in 2004   Addendum: Discharge have been planned for 11/01/2014, however, on that morning she reported multiple episodes of nausea and vomiting. She was worked up with abdominal film which did not show evidence of bowel obstruction. I suspected that the cause for nausea and vomiting may have been related to diabetic gastroparesis as she had a similar episode earlier during this hospitalization. Reglan 10 mg IV 3 times a day was scheduled prior to meals. Patient reports significant improvement with this intervention as she did not have further episodes of nausea vomiting by 11/02/2014. She was  tolerating by mouth intake and stay feeling well enough to be discharged to SNF.   TODAY-DAY OF DISCHARGE:  Subjective:   Patient states feeling much better today, reporting resolution to nausea and vomiting. States feeling well enough to be discharged.  Objective:   Blood pressure 178/97, pulse 84, temperature 98 F (36.7 C), temperature source Oral, resp. rate 20, height 5\' 7"  (1.702 m), weight 81.874 kg (180 lb 8 oz), SpO2 100 %.  Intake/Output Summary (Last 24 hours) at 11/02/14 1025 Last data filed at 11/02/14 0914  Gross per 24 hour  Intake    720 ml  Output   2400 ml  Net  -1680 ml   Filed Weights   10/24/14 1300 10/29/14 1044  Weight: 83.008 kg (183 lb) 81.874 kg (180 lb 8 oz)    Exam Awake Alert, Oriented *3, No new F.N deficits, Normal affect [place].AT,PERRAL Supple Neck,No JVD, No cervical lymphadenopathy appriciated.  Symmetrical Chest wall movement, Good air movement bilaterally, CTAB RRR,No Gallops,Rubs or new Murmurs, No Parasternal Heave +ve B.Sounds, Abd Soft, Non tender, No organomegaly appriciated, No rebound -guarding or rigidity. No Cyanosis, Clubbing or edema, No new Rash or bruise  DISCHARGE CONDITION: Stable  DISPOSITION: SNF  DISCHARGE INSTRUCTIONS:    Activity:  As tolerated with Full fall precautions use walker/cane & assistance as needed  Diet recommendation: Diabetic Diet Heart Healthy diet   Discharge Instructions    Call MD for:  difficulty breathing, headache or visual disturbances    Complete by:  As directed      Call MD for:  extreme fatigue    Complete by:  As directed      Call MD for:  hives    Complete by:  As directed      Call MD for:  persistant dizziness or light-headedness    Complete by:  As directed      Call MD for:  persistant nausea and vomiting    Complete by:  As directed  Call MD for:  redness, tenderness, or signs of infection (pain, swelling, redness, odor or green/yellow discharge around incision site)     Complete by:  As directed      Call MD for:  redness, tenderness, or signs of infection (pain, swelling, redness, odor or green/yellow discharge around incision site)    Complete by:  As directed      Call MD for:  severe uncontrolled pain    Complete by:  As directed      Call MD for:  temperature >100.4    Complete by:  As directed      Diet - low sodium heart healthy    Complete by:  As directed      Diet - low sodium heart healthy    Complete by:  As directed      Diet Carb Modified    Complete by:  As directed      Discharge wound care:    Complete by:  As directed   kEEP DRESSING AS IS FOR ONE WEEK-UNLESS IT GETS DIRTY (THEN CAN CHANGE)-PATIENT TO FOLLOW UP WITH DR DUDA IN 1 WEEK FOR DRESSING CHANGE     Increase activity slowly    Complete by:  As directed      Increase activity slowly    Complete by:  As directed            Follow-up Information    Follow up with DUDA,MARCUS V, MD. Schedule an appointment as soon as possible for a visit in 1 week.   Specialty:  Orthopedic Surgery   Contact information:   [place] [address] [phone]       Follow up with Elyn Peers, MD. Schedule an appointment as soon as possible for a visit in 1 week.   Specialty:  Family Medicine   Contact information:   [address] [place]  [address] [phone]       Total Time spent on discharge 35  min  Signed: Kelvin Cellar 11/02/2014 10:25 AM

## 2014-11-22 DIAGNOSIS — E1140 Type 2 diabetes mellitus with diabetic neuropathy, unspecified: Secondary | ICD-10-CM | POA: Diagnosis not present

## 2014-11-22 DIAGNOSIS — E0840 Diabetes mellitus due to underlying condition with diabetic neuropathy, unspecified: Secondary | ICD-10-CM | POA: Diagnosis not present

## 2014-11-22 DIAGNOSIS — E780 Pure hypercholesterolemia: Secondary | ICD-10-CM | POA: Diagnosis not present

## 2014-12-25 DIAGNOSIS — E0959 Drug or chemical induced diabetes mellitus with other circulatory complications: Secondary | ICD-10-CM | POA: Diagnosis not present

## 2014-12-25 DIAGNOSIS — I10 Essential (primary) hypertension: Secondary | ICD-10-CM | POA: Diagnosis not present

## 2014-12-26 DIAGNOSIS — Z89432 Acquired absence of left foot: Secondary | ICD-10-CM | POA: Diagnosis not present

## 2014-12-26 DIAGNOSIS — R262 Difficulty in walking, not elsewhere classified: Secondary | ICD-10-CM | POA: Diagnosis not present

## 2014-12-26 DIAGNOSIS — T8132XA Disruption of internal operation (surgical) wound, not elsewhere classified, initial encounter: Secondary | ICD-10-CM | POA: Diagnosis not present

## 2014-12-26 DIAGNOSIS — M86179 Other acute osteomyelitis, unspecified ankle and foot: Secondary | ICD-10-CM | POA: Diagnosis not present

## 2014-12-26 DIAGNOSIS — M6281 Muscle weakness (generalized): Secondary | ICD-10-CM | POA: Diagnosis not present

## 2015-01-02 DIAGNOSIS — M6281 Muscle weakness (generalized): Secondary | ICD-10-CM | POA: Diagnosis not present

## 2015-01-02 DIAGNOSIS — T8132XA Disruption of internal operation (surgical) wound, not elsewhere classified, initial encounter: Secondary | ICD-10-CM | POA: Diagnosis not present

## 2015-01-02 DIAGNOSIS — Z89432 Acquired absence of left foot: Secondary | ICD-10-CM | POA: Diagnosis not present

## 2015-01-02 DIAGNOSIS — M86179 Other acute osteomyelitis, unspecified ankle and foot: Secondary | ICD-10-CM | POA: Diagnosis not present

## 2015-01-02 DIAGNOSIS — R262 Difficulty in walking, not elsewhere classified: Secondary | ICD-10-CM | POA: Diagnosis not present

## 2015-01-10 DIAGNOSIS — M6281 Muscle weakness (generalized): Secondary | ICD-10-CM | POA: Diagnosis not present

## 2015-01-10 DIAGNOSIS — R262 Difficulty in walking, not elsewhere classified: Secondary | ICD-10-CM | POA: Diagnosis not present

## 2015-01-10 DIAGNOSIS — M86179 Other acute osteomyelitis, unspecified ankle and foot: Secondary | ICD-10-CM | POA: Diagnosis not present

## 2015-01-10 DIAGNOSIS — T8132XA Disruption of internal operation (surgical) wound, not elsewhere classified, initial encounter: Secondary | ICD-10-CM | POA: Diagnosis not present

## 2015-01-10 DIAGNOSIS — Z89432 Acquired absence of left foot: Secondary | ICD-10-CM | POA: Diagnosis not present

## 2015-01-29 DIAGNOSIS — E0840 Diabetes mellitus due to underlying condition with diabetic neuropathy, unspecified: Secondary | ICD-10-CM | POA: Diagnosis not present

## 2015-01-29 DIAGNOSIS — Z899 Acquired absence of limb, unspecified: Secondary | ICD-10-CM | POA: Diagnosis not present

## 2015-01-29 DIAGNOSIS — E0959 Drug or chemical induced diabetes mellitus with other circulatory complications: Secondary | ICD-10-CM | POA: Diagnosis not present

## 2015-01-29 DIAGNOSIS — E1140 Type 2 diabetes mellitus with diabetic neuropathy, unspecified: Secondary | ICD-10-CM | POA: Diagnosis not present

## 2015-02-06 DIAGNOSIS — Z89512 Acquired absence of left leg below knee: Secondary | ICD-10-CM | POA: Diagnosis not present

## 2015-02-06 DIAGNOSIS — E1142 Type 2 diabetes mellitus with diabetic polyneuropathy: Secondary | ICD-10-CM | POA: Diagnosis not present

## 2015-02-12 DIAGNOSIS — E0951 Drug or chemical induced diabetes mellitus with diabetic peripheral angiopathy without gangrene: Secondary | ICD-10-CM | POA: Diagnosis not present

## 2015-02-12 DIAGNOSIS — E1140 Type 2 diabetes mellitus with diabetic neuropathy, unspecified: Secondary | ICD-10-CM | POA: Diagnosis not present

## 2015-02-14 DIAGNOSIS — I10 Essential (primary) hypertension: Secondary | ICD-10-CM | POA: Diagnosis not present

## 2015-02-14 DIAGNOSIS — E1140 Type 2 diabetes mellitus with diabetic neuropathy, unspecified: Secondary | ICD-10-CM | POA: Diagnosis not present

## 2015-02-14 DIAGNOSIS — E0951 Drug or chemical induced diabetes mellitus with diabetic peripheral angiopathy without gangrene: Secondary | ICD-10-CM | POA: Diagnosis not present

## 2015-02-14 DIAGNOSIS — E0840 Diabetes mellitus due to underlying condition with diabetic neuropathy, unspecified: Secondary | ICD-10-CM | POA: Diagnosis not present

## 2015-02-15 ENCOUNTER — Ambulatory Visit: Payer: Commercial Managed Care - HMO | Attending: Orthopedic Surgery | Admitting: Physical Therapy

## 2015-02-15 DIAGNOSIS — Z7409 Other reduced mobility: Secondary | ICD-10-CM

## 2015-02-15 DIAGNOSIS — Z89511 Acquired absence of right leg below knee: Secondary | ICD-10-CM

## 2015-02-15 DIAGNOSIS — R269 Unspecified abnormalities of gait and mobility: Secondary | ICD-10-CM | POA: Diagnosis not present

## 2015-02-15 DIAGNOSIS — R2689 Other abnormalities of gait and mobility: Secondary | ICD-10-CM

## 2015-02-15 DIAGNOSIS — Z89611 Acquired absence of right leg above knee: Secondary | ICD-10-CM | POA: Insufficient documentation

## 2015-02-15 DIAGNOSIS — Z4781 Encounter for orthopedic aftercare following surgical amputation: Secondary | ICD-10-CM | POA: Insufficient documentation

## 2015-02-15 DIAGNOSIS — Z89612 Acquired absence of left leg above knee: Secondary | ICD-10-CM | POA: Insufficient documentation

## 2015-02-15 DIAGNOSIS — Z89512 Acquired absence of left leg below knee: Secondary | ICD-10-CM

## 2015-02-15 NOTE — Therapy (Signed)
[facility] [address] [place], [place], [address] Phone: [phone]   Fax:  [phone]  Physical Therapy Evaluation  Patient Details  Name: Diana Coleman MRN: [id number] Date of Birth: 03-06-1951 Referring Provider:  Newt Minion, MD  Encounter Date: 02/15/2015      PT End of Session - 02/15/15 0800    Visit Number 1   Number of Visits 17   Date for PT Re-Evaluation 04/13/15   PT Start Time 0800   PT Stop Time 0845   PT Time Calculation (min) 45 min   Equipment Utilized During Treatment Gait belt   Activity Tolerance Patient tolerated treatment well   Behavior During Therapy [facility] for tasks assessed/performed      Past Medical History  Diagnosis Date  . Type II diabetes mellitus   . Charcot foot due to diabetes mellitus   . Cellulitis of foot, left 10/24/2014    hx/notes 10/24/2014  . Osteomyelitis of right foot     hx/notes 10/24/2014    Past Surgical History  Procedure Laterality Date  . Above knee leg amputation Right 2004  . Tubal ligation  1973  . Reduction mammaplasty Bilateral 1987  . Foot surgery Left 1980's    "ulcer removed"  . Toe amputation Left ~ 2011    "top of my 3rd toe"  . Amputation Left 10/29/2014    Procedure: AMPUTATION BELOW KNEE - LEFT;  Surgeon: Newt Minion, MD;  Location: [place];  Service: Orthopedics;  Laterality: Left;    There were no vitals taken for this visit.  Visit Diagnosis:  Abnormality of gait  Balance problems  Impaired functional mobility and activity tolerance  Status post bilateral below knee amputation      Subjective Assessment - 02/15/15 0806    Symptoms this 64yo female underwent a left Transtibial Amputation 10/29/14 with history of previous right Transtibial Amputation in 2004. She recieved first left prosthesis on 02/05/15 and new right prosthesis delivered Oct 2015. She presents for PT evaluation.         Patient Stated Goals Driving, walking  in community, volunteering again   Currently in Pain? No/denies          [facility] PT Assessment - 02/15/15 0800    Assessment   Medical Diagnosis Bilateral Transtibial Amputations   Onset Date 02/05/15   Precautions   Precautions Fall   Restrictions   Weight Bearing Restrictions No   Balance Screen   Has the patient fallen in the past 6 months No   Has the patient had a decrease in activity level because of a fear of falling?  Yes   Is the patient reluctant to leave their home because of a fear of falling?  No  uses scooter   Home Environment   Living Enviornment Private residence   Living Arrangements Alone   Type of [place] Access Level entry  Handicap apartment   [place] - quad;Grab bars - tub/shower;Electric scooter   Prior Function   Level of Independence Independent with basic ADLs;Independent with homemaking with ambulation;Independent with gait;Independent with transfers  no device community   Vocation Retired   Charity fundraiser Status Within Functional Limits for tasks assessed   ROM / Strength   AROM / PROM / Strength AROM;Strength   AROM   Overall AROM  Within functional limits for tasks performed;Deficits  left knee ext -13  Overall AROM Comments left knee ext -13   Strength   Overall Strength Within functional limits for tasks performed   Transfers   Transfers Sit to Stand;Stand to Sit   Sit to Stand 5: Supervision;With upper extremity assist;With armrests;From chair/3-in-1  uses back of legs against chair to stabilize   Stand to Sit 5: Supervision;With upper extremity assist;With armrests;To chair/3-in-1  uses back of legs against chair to control descent   Ambulation/Gait   Ambulation/Gait Yes   Ambulation/Gait Assistance 4: Min assist   Ambulation Distance (Feet) 75 Feet   Assistive device Large base quad cane;Prostheses   Gait Pattern Step-through pattern;Decreased step length -  left;Decreased stance time - right;Decreased hip/knee flexion - right;Decreased hip/knee flexion - left;Right steppage;Left steppage;Decreased trunk rotation;Trunk flexed;Wide base of support;Poor foot clearance - left   Ambulation Surface Level;Indoor   Gait velocity 1.98 ft/sec   Berg Balance Test   Sit to Stand Needs minimal aid to stand or to stabilize   Standing Unsupported Able to stand 2 minutes with supervision   Sitting with Back Unsupported but Feet Supported on Floor or Stool Able to sit safely and securely 2 minutes   Stand to Sit Uses backs of legs against chair to control descent   Transfers Able to transfer safely, definite need of hands   Standing Unsupported with Eyes Closed Unable to keep eyes closed 3 seconds but stays steady   Standing Ubsupported with Feet Together Needs help to attain position and unable to hold for 15 seconds   From Standing, Reach Forward with Outstretched Arm Loses balance while trying/requires external support   From Standing Position, Pick up Object from Floor Unable to try/needs assist to keep balance   From Standing Position, Turn to Look Behind Over each Shoulder Needs assist to keep from losing balance and falling   Turn 360 Degrees Needs assistance while turning   Standing Unsupported, Alternately Place Feet on Step/Stool Needs assistance to keep from falling or unable to try   Standing Unsupported, One Foot in [employer] balance while stepping or standing   Standing on One Leg Unable to try or needs assist to prevent fall   Total Score 14   Timed Up and Go Test   Normal TUG (seconds) 24.72  24.72 sec with LBQC, 22.00 sec no device         Prosthetics Assessment - 02/15/15 0800    Prosthetics   Prosthetic Care Dependent with Residual limb care;Skin check;Prosthetic cleaning;Correct ply sock adjustment  donning   Donning prosthesis  Min assist   Doffing prosthesis  Supervision   Current prosthetic wear tolerance (days/week)  worn new  left prosthesis 10 of 10 days since delivery  wears right prosthesis daily   Current prosthetic wear tolerance (#hours/day)  wearing bilateral prostheses >90% awake hours  left limb is over sweaty placing at risk of skin issues   Current prosthetic weight-bearing tolerance (hours/day)  10 min with intermittent UE support                 [facility] Adult PT Treatment/Exercise - 02/15/15 0800    Prosthetics   Education Provided Residual limb care;Skin check;Prosthetic cleaning;Correct ply sock adjustment;Proper wear schedule/adjustment  donning   Person(s) Educated Patient   Education Method Explanation;Demonstration   Education Method Verbalized understanding;Returned demonstration;Needs further instruction   Donning Prosthesis Supervision   Doffing Prosthesis Supervision                PT Education - 02/15/15 0800  Education provided Yes   Education Details See prosthetic education   Person(s) Educated Patient   Methods Explanation;Demonstration   Comprehension Verbalized understanding;Returned demonstration;Need further instruction          PT Short Term Goals - 02/15/15 0800    PT SHORT TERM GOAL #1   Title demonstrates proper donning of prostheses (Target Date: 03/16/15)   Time 4   Period Weeks   Status New   PT SHORT TERM GOAL #2   Title tolerates wear of bilateral prostheses >90% of awake hours with no skin issues and discomfort <2/10 (Target Date: 03/16/15)   Time 4   Period Weeks   Status New   PT SHORT TERM GOAL #3   Title ambulates 200' with [facility] & prostheses modified independent. (Target Date: 03/16/15)   Time 4   Period Weeks   Status New   PT SHORT TERM GOAL #4   Title negotiates ramp & curb with [facility] & prostheses with supervision. (Target Date: 03/16/15)   Time 4   Period Weeks   Status New   PT SHORT TERM GOAL #5   Title manages clothes including pants for toileting and jacket in standing safely modified independent. (Target Date: 03/16/15)    Time 4   Period Weeks   Status New           PT Long Term Goals - 02/15/15 0800    PT LONG TERM GOAL #1   Title independent with prosthetic care. (Target Date: 04/13/15)   Time 8   Period Weeks   Status New   PT LONG TERM GOAL #2   Title wears bilateral prostheses >90% of awake hours with no skin issues or pain.  (Target Date: 04/13/15)   Time 8   Period Weeks   Status New   PT LONG TERM GOAL #3   Title ambulates 100' with bilateral prostheses carrying plate & cup modified independent.  (Target Date: 04/13/15)   Time 8   Period Weeks   Status New   PT LONG TERM GOAL #4   Title ambulates 300' with LRAD & bilateral prostheses modified independent.  (Target Date: 04/13/15)   Time 8   Period Weeks   Status New   PT LONG TERM GOAL #5   Title negotiates ramp, curb & stairs with LRAD & bilateral prostheses modified independent.  (Target Date: 04/13/15)   Time 8   Period Weeks   Status New   Additional Long Term Goals   Additional Long Term Goals Yes   PT LONG TERM GOAL #6   Title Timed Up & Go with prostheses only <13.5 seconds  (Target Date: 04/13/15)   Time 8   Period Weeks   Status New   PT LONG TERM GOAL #7   Title Berg Balance >30/56  (Target Date: 04/13/15)   Time 8   Period Weeks   Status New               Plan - 02/15/15 0800    Clinical Impression Statement This 64yo female now has bilateral Transtibial Amputations with dependency with prostheses. She has dependency with gait & balance with prostheses and is at risk of falls.   Pt will benefit from skilled therapeutic intervention in order to improve on the following deficits Abnormal gait;Decreased activity tolerance;Decreased balance;Decreased endurance;Decreased knowledge of use of DME;Decreased mobility;Decreased range of motion;Decreased strength;Other (comment)  prosthetic dependency   Rehab Potential Good   PT Frequency 2x / week   PT Duration 8  weeks   PT Treatment/Interventions ADLs/Self Care  Home Management;DME Instruction;Gait training;Stair training;Functional mobility training;Therapeutic activities;Therapeutic exercise;Balance training;Neuromuscular re-education;Patient/family education;Other (comment)  prosthetic training   PT Next Visit Plan review prosthetic care, gait with [facility] including barriers   PT Home Exercise Plan balance HEP   Consulted and Agree with Plan of Care Patient          G-Codes - 12-Mar-2015 0800    Functional Assessment Tool Used Merrilee Jansky Balance14/56; Timed Up & Go 24.72sec   Functional Limitation Mobility: Walking and moving around   Mobility: Walking and Moving Around Current Status (K9326) At least 60 percent but less than 80 percent impaired, limited or restricted   Mobility: Walking and Moving Around Goal Status (Z1245) At least 20 percent but less than 40 percent impaired, limited or restricted       Problem List Patient Active Problem List   Diagnosis Date Noted  . Gastroparesis 11/02/2014  . Diabetes mellitus due to underlying condition with other specified complication   . Diabetic foot   . Nausea & vomiting   . Essential hypertension 10/25/2014  . GERD (gastroesophageal reflux disease) 10/25/2014  . Cellulitis 10/24/2014  . Diabetes 10/24/2014  . Charcot foot due to diabetes mellitus 10/24/2014    Julious Langlois PT,DPT 03/12/15, 10:37 PM  [facility] [address] [place], [place], [address] Phone: [phone]   Fax:  [phone]

## 2015-02-19 ENCOUNTER — Ambulatory Visit: Payer: Commercial Managed Care - HMO | Admitting: Physical Therapy

## 2015-02-26 ENCOUNTER — Encounter: Payer: Self-pay | Admitting: Physical Therapy

## 2015-02-26 ENCOUNTER — Ambulatory Visit: Payer: Commercial Managed Care - HMO | Attending: Orthopedic Surgery | Admitting: Physical Therapy

## 2015-02-26 DIAGNOSIS — Z4781 Encounter for orthopedic aftercare following surgical amputation: Secondary | ICD-10-CM | POA: Diagnosis not present

## 2015-02-26 DIAGNOSIS — Z89611 Acquired absence of right leg above knee: Secondary | ICD-10-CM | POA: Diagnosis not present

## 2015-02-26 DIAGNOSIS — Z89511 Acquired absence of right leg below knee: Secondary | ICD-10-CM

## 2015-02-26 DIAGNOSIS — R269 Unspecified abnormalities of gait and mobility: Secondary | ICD-10-CM | POA: Diagnosis not present

## 2015-02-26 DIAGNOSIS — Z7409 Other reduced mobility: Secondary | ICD-10-CM

## 2015-02-26 DIAGNOSIS — Z89612 Acquired absence of left leg above knee: Secondary | ICD-10-CM | POA: Diagnosis not present

## 2015-02-26 DIAGNOSIS — Z89512 Acquired absence of left leg below knee: Secondary | ICD-10-CM

## 2015-02-26 DIAGNOSIS — R2689 Other abnormalities of gait and mobility: Secondary | ICD-10-CM

## 2015-02-26 NOTE — Therapy (Signed)
[facility] [address] [place], [place], [address] Phone: [phone]   Fax:  [phone]  Physical Therapy Treatment  Patient Details  Name: Diana Coleman MRN: [id number] Date of Birth: [birth year] Referring Provider:  Lucianne Lei, MD  Encounter Date: 02/26/2015      PT End of Session - 02/26/15 1036    Visit Number 2   Number of Visits 17   Date for PT Re-Evaluation 04/13/15   PT Start Time 0930   PT Stop Time 1015   PT Time Calculation (min) 45 min   Equipment Utilized During Treatment Gait belt   Activity Tolerance Patient tolerated treatment well   Behavior During Therapy [facility] for tasks assessed/performed      Past Medical History  Diagnosis Date  . Type II diabetes mellitus   . Charcot foot due to diabetes mellitus   . Cellulitis of foot, left 10/24/2014    hx/notes 10/24/2014  . Osteomyelitis of right foot     hx/notes 10/24/2014    Past Surgical History  Procedure Laterality Date  . Above knee leg amputation Right 2004  . Tubal ligation  1973  . Reduction mammaplasty Bilateral 1987  . Foot surgery Left 1980's    "ulcer removed"  . Toe amputation Left ~ 2011    "top of my 3rd toe"  . Amputation Left 10/29/2014    Procedure: AMPUTATION BELOW KNEE - LEFT;  Surgeon: Newt Minion, MD;  Location: [place];  Service: Orthopedics;  Laterality: Left;    There were no vitals taken for this visit.  Visit Diagnosis:  Abnormality of gait  Balance problems  Impaired functional mobility and activity tolerance  Status post bilateral below knee amputation      Subjective Assessment - 02/26/15 1033    Symptoms She has been wearing prostheses all awake hours but left limb hurts and has a blister.   Currently in Pain? Yes   Pain Score 3    Pain Location Leg   Pain Orientation Left   Pain Descriptors / Indicators Pressure     Therapeutic Exercise: Quad sets with leg extended on 2nd chair and seated  hamstring stretch with foot on floor. Patient verbalizes understanding  Prosthetic Training See patient education. Patient has superficial 22mm wound on incision from blister that opened up and 2 small wounds that appear to be underlying suture working its way out. PT covered with Tegaderm and instructed patient in use. Patient ambulated 25' with [facility] and prostheses with SBA. Patient ambulated 200' with single point cane [facility]) & prostheses with SBA. Gait was more fluent with SPC. PT demo / instructed in negotiating stairs, curb and ramp with prostheses. Patient negotiated stairs (4 steps 8 reps) with 2 rails with verbal & tactile cues on technique. Patient negotiated curb 10 reps with SPC and hand hold minA with cues on technique. Patient negotiated ramp 3 reps with SPC with min A (no hand hold).                       PT Education - 02/26/15 1035    Education provided Yes   Education Details signs of sweating and drying limb/liner, use of Tegaderm, use of deodarant, adjusting ply socks,barriers with bil. prostheses   Person(s) Educated Patient   Methods Explanation;Demonstration   Comprehension Verbalized understanding;Returned demonstration;Need further instruction          PT Short Term Goals - 02/15/15 0800    PT SHORT  TERM GOAL #1   Title demonstrates proper donning of prostheses (Target Date: 03/16/15)   Time 4   Period Weeks   Status New   PT SHORT TERM GOAL #2   Title tolerates wear of bilateral prostheses >90% of awake hours with no skin issues and discomfort <2/10 (Target Date: 03/16/15)   Time 4   Period Weeks   Status New   PT SHORT TERM GOAL #3   Title ambulates 200' with [facility] & prostheses modified independent. (Target Date: 03/16/15)   Time 4   Period Weeks   Status New   PT SHORT TERM GOAL #4   Title negotiates ramp & curb with [facility] & prostheses with supervision. (Target Date: 03/16/15)   Time 4   Period Weeks   Status New   PT SHORT TERM GOAL #5    Title manages clothes including pants for toileting and jacket in standing safely modified independent. (Target Date: 03/16/15)   Time 4   Period Weeks   Status New           PT Long Term Goals - 02/15/15 0800    PT LONG TERM GOAL #1   Title independent with prosthetic care. (Target Date: 04/13/15)   Time 8   Period Weeks   Status New   PT LONG TERM GOAL #2   Title wears bilateral prostheses >90% of awake hours with no skin issues or pain.  (Target Date: 04/13/15)   Time 8   Period Weeks   Status New   PT LONG TERM GOAL #3   Title ambulates 100' with bilateral prostheses carrying plate & cup modified independent.  (Target Date: 04/13/15)   Time 8   Period Weeks   Status New   PT LONG TERM GOAL #4   Title ambulates 300' with LRAD & bilateral prostheses modified independent.  (Target Date: 04/13/15)   Time 8   Period Weeks   Status New   PT LONG TERM GOAL #5   Title negotiates ramp, curb & stairs with LRAD & bilateral prostheses modified independent.  (Target Date: 04/13/15)   Time 8   Period Weeks   Status New   Additional Long Term Goals   Additional Long Term Goals Yes   PT LONG TERM GOAL #6   Title Timed Up & Go with prostheses only <13.5 seconds  (Target Date: 04/13/15)   Time 8   Period Weeks   Status New   PT LONG TERM GOAL #7   Title Berg Balance >30/56  (Target Date: 04/13/15)   Time 8   Period Weeks   Status New               Plan - 02/26/15 1037    Clinical Impression Statement Pateint appears to understand sweating & skin integrity issues and adjusting ply socks to control depth seated in socket will help with discomfort and skin issues. patient has better understanding of barriers with 2 prostheses.   Pt will benefit from skilled therapeutic intervention in order to improve on the following deficits Abnormal gait;Decreased activity tolerance;Decreased balance;Decreased endurance;Decreased knowledge of use of DME;Decreased mobility;Decreased range of  motion;Decreased strength;Other (comment)  prosthetic dependency   Rehab Potential Good   PT Frequency 2x / week   PT Duration 8 weeks   PT Treatment/Interventions ADLs/Self Care Home Management;DME Instruction;Gait training;Stair training;Functional mobility training;Therapeutic activities;Therapeutic exercise;Balance training;Neuromuscular re-education;Patient/family education;Other (comment)  prosthetic training   PT Next Visit Plan review prosthetic care, gait with single point cane including barriers  PT Home Exercise Plan balance HEP   Consulted and Agree with Plan of Care Patient        Problem List Patient Active Problem List   Diagnosis Date Noted  . Gastroparesis 11/02/2014  . Diabetes mellitus due to underlying condition with other specified complication   . Diabetic foot   . Nausea & vomiting   . Essential hypertension 10/25/2014  . GERD (gastroesophageal reflux disease) 10/25/2014  . Cellulitis 10/24/2014  . Diabetes 10/24/2014  . Charcot foot due to diabetes mellitus 10/24/2014    Jamey Reas  PT, DPT  02/26/2015, 10:40 AM  [facility] [address] [place], [place], [address] Phone: [phone]   Fax:  [phone]

## 2015-02-27 ENCOUNTER — Ambulatory Visit: Payer: Commercial Managed Care - HMO | Admitting: Physical Therapy

## 2015-02-27 ENCOUNTER — Encounter: Payer: Self-pay | Admitting: Physical Therapy

## 2015-02-27 DIAGNOSIS — R269 Unspecified abnormalities of gait and mobility: Secondary | ICD-10-CM

## 2015-02-27 DIAGNOSIS — Z7409 Other reduced mobility: Secondary | ICD-10-CM

## 2015-02-27 DIAGNOSIS — Z89512 Acquired absence of left leg below knee: Secondary | ICD-10-CM

## 2015-02-27 DIAGNOSIS — Z89511 Acquired absence of right leg below knee: Secondary | ICD-10-CM

## 2015-02-27 DIAGNOSIS — Z89612 Acquired absence of left leg above knee: Secondary | ICD-10-CM | POA: Diagnosis not present

## 2015-02-27 DIAGNOSIS — Z4781 Encounter for orthopedic aftercare following surgical amputation: Secondary | ICD-10-CM | POA: Diagnosis not present

## 2015-02-27 DIAGNOSIS — Z89611 Acquired absence of right leg above knee: Secondary | ICD-10-CM | POA: Diagnosis not present

## 2015-02-27 DIAGNOSIS — R2689 Other abnormalities of gait and mobility: Secondary | ICD-10-CM

## 2015-02-27 NOTE — Therapy (Signed)
[facility] [address] [place], [place], [address] Phone: [phone]   Fax:  [phone]  Physical Therapy Treatment  Patient Details  Name: Diana Coleman MRN: [id number] Date of Birth: 1951/04/24 Referring Provider:  Lucianne Lei, MD  Encounter Date: 02/27/2015      PT End of Session - 02/27/15 1626    Visit Number 3   Number of Visits 17   Date for PT Re-Evaluation 04/13/15   PT Start Time 4944   PT Stop Time 1615   PT Time Calculation (min) 30 min   Equipment Utilized During Treatment Gait belt   Activity Tolerance Patient tolerated treatment well   Behavior During Therapy [facility] for tasks assessed/performed      Past Medical History  Diagnosis Date  . Type II diabetes mellitus   . Charcot foot due to diabetes mellitus   . Cellulitis of foot, left 10/24/2014    hx/notes 10/24/2014  . Osteomyelitis of right foot     hx/notes 10/24/2014    Past Surgical History  Procedure Laterality Date  . Above knee leg amputation Right 2004  . Tubal ligation  1973  . Reduction mammaplasty Bilateral 1987  . Foot surgery Left 1980's    "ulcer removed"  . Toe amputation Left ~ 2011    "top of my 3rd toe"  . Amputation Left 10/29/2014    Procedure: AMPUTATION BELOW KNEE - LEFT;  Surgeon: Newt Minion, MD;  Location: [facility];  Service: Orthopedics;  Laterality: Left;    There were no vitals taken for this visit.  Visit Diagnosis:  Abnormality of gait  Balance problems  Impaired functional mobility and activity tolerance  Status post bilateral below knee amputation      Subjective Assessment - 02/27/15 1624    Symptoms Saw prosthetist today and he added the pads that PT requested & it feels better.   Currently in Pain? Yes   Pain Score 2    Pain Location Leg   Pain Orientation Left     Prosthetic Training: Wounds continue to have small bloody drainage.  PT demo sit to /from stand technique: sit to stand from  chair with armrests with supervision and chair without armrests pushing with UE with minA. Patient ambulated 200' with single point cane [facility]) and prostheses with min guard /cues on posture. Patient negotiated 4 steps 5 reps with 2 rails with tactile /verbal cues for knee control. Patient negotiated ramp & curb with SPC / prostheses with minA 2 reps each. See patient ed.                        PT Education - 02/27/15 1625    Education provided Yes   Education Details applying Tegaderm, At counter, stepping with left leg with initial contact will heel with wt acceptance & stance on left leg /lifting right LE   Person(s) Educated Patient   Methods Explanation;Demonstration   Comprehension Verbalized understanding;Returned demonstration          PT Short Term Goals - 02/15/15 0800    PT SHORT TERM GOAL #1   Title demonstrates proper donning of prostheses (Target Date: 03/16/15)   Time 4   Period Weeks   Status New   PT SHORT TERM GOAL #2   Title tolerates wear of bilateral prostheses >90% of awake hours with no skin issues and discomfort <2/10 (Target Date: 03/16/15)   Time 4   Period Weeks  Status New   PT SHORT TERM GOAL #3   Title ambulates 200' with [facility] & prostheses modified independent. (Target Date: 03/16/15)   Time 4   Period Weeks   Status New   PT SHORT TERM GOAL #4   Title negotiates ramp & curb with [facility] & prostheses with supervision. (Target Date: 03/16/15)   Time 4   Period Weeks   Status New   PT SHORT TERM GOAL #5   Title manages clothes including pants for toileting and jacket in standing safely modified independent. (Target Date: 03/16/15)   Time 4   Period Weeks   Status New           PT Long Term Goals - 02/15/15 0800    PT LONG TERM GOAL #1   Title independent with prosthetic care. (Target Date: 04/13/15)   Time 8   Period Weeks   Status New   PT LONG TERM GOAL #2   Title wears bilateral prostheses >90% of awake hours with no  skin issues or pain.  (Target Date: 04/13/15)   Time 8   Period Weeks   Status New   PT LONG TERM GOAL #3   Title ambulates 100' with bilateral prostheses carrying plate & cup modified independent.  (Target Date: 04/13/15)   Time 8   Period Weeks   Status New   PT LONG TERM GOAL #4   Title ambulates 300' with LRAD & bilateral prostheses modified independent.  (Target Date: 04/13/15)   Time 8   Period Weeks   Status New   PT LONG TERM GOAL #5   Title negotiates ramp, curb & stairs with LRAD & bilateral prostheses modified independent.  (Target Date: 04/13/15)   Time 8   Period Weeks   Status New   Additional Long Term Goals   Additional Long Term Goals Yes   PT LONG TERM GOAL #6   Title Timed Up & Go with prostheses only <13.5 seconds  (Target Date: 04/13/15)   Time 8   Period Weeks   Status New   PT LONG TERM GOAL #7   Title Berg Balance >30/56  (Target Date: 04/13/15)   Time 8   Period Weeks   Status New               Plan - 02/27/15 1627    Clinical Impression Statement patient's wounds continue to be same size and she understands how to apply Tegaderm. Patient required less assist on ramp & curb.   Pt will benefit from skilled therapeutic intervention in order to improve on the following deficits Abnormal gait;Decreased activity tolerance;Decreased balance;Decreased endurance;Decreased knowledge of use of DME;Decreased mobility;Decreased range of motion;Decreased strength;Other (comment)  prosthetic dependency   Rehab Potential Good   PT Frequency 2x / week   PT Duration 8 weeks   PT Treatment/Interventions ADLs/Self Care Home Management;DME Instruction;Gait training;Stair training;Functional mobility training;Therapeutic activities;Therapeutic exercise;Balance training;Neuromuscular re-education;Patient/family education;Other (comment)  prosthetic training   PT Next Visit Plan review prosthetic care, gait with single point cane including barriers   PT Home Exercise  Plan balance HEP   Consulted and Agree with Plan of Care Patient        Problem List Patient Active Problem List   Diagnosis Date Noted  . Gastroparesis 11/02/2014  . Diabetes mellitus due to underlying condition with other specified complication   . Diabetic foot   . Nausea & vomiting   . Essential hypertension 10/25/2014  . GERD (gastroesophageal reflux disease) 10/25/2014  .  Cellulitis 10/24/2014  . Diabetes 10/24/2014  . Charcot foot due to diabetes mellitus 10/24/2014    Jamey Reas PT, DPT 02/27/2015, 4:31 PM  [facility] [address] [place], [place], [address] Phone: [phone]   Fax:  [phone]

## 2015-03-05 ENCOUNTER — Ambulatory Visit: Payer: Commercial Managed Care - HMO | Admitting: Physical Therapy

## 2015-03-05 ENCOUNTER — Encounter: Payer: Self-pay | Admitting: Physical Therapy

## 2015-03-05 DIAGNOSIS — R269 Unspecified abnormalities of gait and mobility: Secondary | ICD-10-CM

## 2015-03-05 DIAGNOSIS — Z4781 Encounter for orthopedic aftercare following surgical amputation: Secondary | ICD-10-CM | POA: Diagnosis not present

## 2015-03-05 DIAGNOSIS — Z89612 Acquired absence of left leg above knee: Secondary | ICD-10-CM | POA: Diagnosis not present

## 2015-03-05 DIAGNOSIS — Z89611 Acquired absence of right leg above knee: Secondary | ICD-10-CM | POA: Diagnosis not present

## 2015-03-05 DIAGNOSIS — Z7409 Other reduced mobility: Secondary | ICD-10-CM

## 2015-03-06 ENCOUNTER — Ambulatory Visit: Payer: Commercial Managed Care - HMO | Admitting: Physical Therapy

## 2015-03-06 ENCOUNTER — Encounter: Payer: Self-pay | Admitting: Physical Therapy

## 2015-03-06 DIAGNOSIS — R269 Unspecified abnormalities of gait and mobility: Secondary | ICD-10-CM | POA: Diagnosis not present

## 2015-03-06 DIAGNOSIS — Z89611 Acquired absence of right leg above knee: Secondary | ICD-10-CM | POA: Diagnosis not present

## 2015-03-06 DIAGNOSIS — Z7409 Other reduced mobility: Secondary | ICD-10-CM

## 2015-03-06 DIAGNOSIS — Z89612 Acquired absence of left leg above knee: Secondary | ICD-10-CM | POA: Diagnosis not present

## 2015-03-06 DIAGNOSIS — Z4781 Encounter for orthopedic aftercare following surgical amputation: Secondary | ICD-10-CM | POA: Diagnosis not present

## 2015-03-06 NOTE — Therapy (Signed)
[facility] [address] [place], [place], [address] Phone: [phone]   Fax:  [phone]  Physical Therapy Treatment  Patient Details  Name: Diana Coleman MRN: [id number] Date of Birth: 07-29-1951 Referring Provider:  Lucianne Lei, MD  Encounter Date: 03/05/2015      PT End of Session - 03/05/15 0853    Visit Number 4   Number of Visits 17   Date for PT Re-Evaluation 04/13/15   Total treatment time [id number] am= 41 minutes   Equipment Utilized During Treatment Gait belt   Activity Tolerance Patient tolerated treatment well   Behavior During Therapy [facility] for tasks assessed/performed      Past Medical History  Diagnosis Date  . Type II diabetes mellitus   . Charcot foot due to diabetes mellitus   . Cellulitis of foot, left 10/24/2014    hx/notes 10/24/2014  . Osteomyelitis of right foot     hx/notes 10/24/2014    Past Surgical History  Procedure Laterality Date  . Above knee leg amputation Right 2004  . Tubal ligation  1973  . Reduction mammaplasty Bilateral 1987  . Foot surgery Left 1980's    "ulcer removed"  . Toe amputation Left ~ 2011    "top of my 3rd toe"  . Amputation Left 10/29/2014    Procedure: AMPUTATION BELOW KNEE - LEFT;  Surgeon: Newt Minion, MD;  Location: [facility];  Service: Orthopedics;  Laterality: Left;    There were no vitals filed for this visit.  Visit Diagnosis:  Abnormality of gait  Impaired functional mobility and activity tolerance      Subjective Assessment - 03/06/15 0852    Symptoms No new complaints. No falls or pain to report.   Currently in Pain? No/denies   Pain Score 0-No pain          [facility] Adult PT Treatment/Exercise - 03/05/15 0810    Transfers   Sit to Stand 5: Supervision;With upper extremity assist;From chair/3-in-1;With armrests   Stand to Sit 5: Supervision;With upper extremity assist;To chair/3-in-1;With armrests   Ambulation/Gait   Ambulation/Gait  Yes   Ambulation/Gait Assistance 4: Min guard;4: Min assist   Ambulation Distance (Feet) 220 Feet  x 2 reps   Assistive device Prostheses;Straight cane   Ambulation Surface Level;Indoor   Stairs Yes   Stairs Assistance 4: Min guard;4: Min assist   Stairs Assistance Details (indicate cue type and reason) cues on posture and sequence. most cueing needed on foot   Stair Management Technique One rail Right;One rail Left;With cane;Forwards;Step to pattern;Alternating pattern   Number of Stairs 4  x 3 reps   Ramp 4: Min assist  with prostheses and cane   Ramp Details (indicate cue type and reason) cues on technique, posture and sequence   Curb 4: Min assist  with prostheses and cane   Curb Details (indicate cue type and reason) cues on sequence, posture and foot placement with descending stairs   Knee/Hip Exercises: Aerobic   Stationary Bike Scifit x 4 extremities level 2.0 x 8 minutes with goal rpm >/=60 for strengthening and activity tolerance.                        Prosthetics   Current prosthetic wear tolerance (days/week)  7 days   Current prosthetic wear tolerance (#hours/day)  wearing bilateral prostheses >90% awake hours   Residual limb condition  wound covered with Tegaderm, healing   Education Provided Residual limb care  Donning Prosthesis Supervision   Doffing Prosthesis Supervision           PT Short Term Goals - 02/15/15 0800    PT SHORT TERM GOAL #1   Title demonstrates proper donning of prostheses (Target Date: 03/16/15)   Time 4   Period Weeks   Status New   PT SHORT TERM GOAL #2   Title tolerates wear of bilateral prostheses >90% of awake hours with no skin issues and discomfort <2/10 (Target Date: 03/16/15)   Time 4   Period Weeks   Status New   PT SHORT TERM GOAL #3   Title ambulates 200' with [facility] & prostheses modified independent. (Target Date: 03/16/15)   Time 4   Period Weeks   Status New   PT SHORT TERM GOAL #4   Title negotiates ramp & curb with  [facility] & prostheses with supervision. (Target Date: 03/16/15)   Time 4   Period Weeks   Status New   PT SHORT TERM GOAL #5   Title manages clothes including pants for toileting and jacket in standing safely modified independent. (Target Date: 03/16/15)   Time 4   Period Weeks   Status New           PT Long Term Goals - 02/15/15 0800    PT LONG TERM GOAL #1   Title independent with prosthetic care. (Target Date: 04/13/15)   Time 8   Period Weeks   Status New   PT LONG TERM GOAL #2   Title wears bilateral prostheses >90% of awake hours with no skin issues or pain.  (Target Date: 04/13/15)   Time 8   Period Weeks   Status New   PT LONG TERM GOAL #3   Title ambulates 100' with bilateral prostheses carrying plate & cup modified independent.  (Target Date: 04/13/15)   Time 8   Period Weeks   Status New   PT LONG TERM GOAL #4   Title ambulates 300' with LRAD & bilateral prostheses modified independent.  (Target Date: 04/13/15)   Time 8   Period Weeks   Status New   PT LONG TERM GOAL #5   Title negotiates ramp, curb & stairs with LRAD & bilateral prostheses modified independent.  (Target Date: 04/13/15)   Time 8   Period Weeks   Status New   Additional Long Term Goals   Additional Long Term Goals Yes   PT LONG TERM GOAL #6   Title Timed Up & Go with prostheses only <13.5 seconds  (Target Date: 04/13/15)   Time 8   Period Weeks   Status New   PT LONG TERM GOAL #7   Title Berg Balance >30/56  (Target Date: 04/13/15)   Time [place]   Status New           Plan - 03/06/15 0853    Pt will benefit from skilled therapeutic intervention in order to improve on the following deficits Abnormal gait;Decreased activity tolerance;Decreased balance;Decreased endurance;Decreased knowledge of use of DME;Decreased mobility;Decreased range of motion;Decreased strength;Other (comment)  prosthetic dependency   Rehab Potential Good   PT Frequency 2x / week   PT Duration 8 weeks   PT  Treatment/Interventions ADLs/Self Care Home Management;DME Instruction;Gait training;Stair training;Functional mobility training;Therapeutic activities;Therapeutic exercise;Balance training;Neuromuscular re-education;Patient/family education;Other (comment)  prosthetic training   PT Next Visit Plan review prosthetic care, gait with single point cane including barriers   PT Home Exercise Plan balance HEP   Consulted and Agree with  Plan of Care Patient        Problem List Patient Active Problem List   Diagnosis Date Noted  . Gastroparesis 11/02/2014  . Diabetes mellitus due to underlying condition with other specified complication   . Diabetic foot   . Nausea & vomiting   . Essential hypertension 10/25/2014  . GERD (gastroesophageal reflux disease) 10/25/2014  . Cellulitis 10/24/2014  . Diabetes 10/24/2014  . Charcot foot due to diabetes mellitus 10/24/2014    Willow Ora 03/06/2015, 8:58 AM  Willow Ora, PTA, [facility] [address], [place], [place] [address] [phone] 03/06/2015, 8:58 AM

## 2015-03-06 NOTE — Therapy (Signed)
[facility] [address] [place], [place], [address] Phone: [phone]   Fax:  [phone]  Physical Therapy Treatment  Patient Details  Name: Diana Coleman MRN: [id number] Date of Birth: 09-Apr-1951 Referring Provider:  Lucianne Lei, MD  Encounter Date: 03/06/2015      PT End of Session - 03/06/15 0853    Visit Number 5   Number of Visits 17   Date for PT Re-Evaluation 04/13/15   PT Start Time 0847   PT Stop Time 0928   PT Time Calculation (min) 41 min   Equipment Utilized During Treatment Gait belt   Activity Tolerance Patient tolerated treatment well   Behavior During Therapy [facility] for tasks assessed/performed      Past Medical History  Diagnosis Date  . Type II diabetes mellitus   . Charcot foot due to diabetes mellitus   . Cellulitis of foot, left 10/24/2014    hx/notes 10/24/2014  . Osteomyelitis of right foot     hx/notes 10/24/2014    Past Surgical History  Procedure Laterality Date  . Above knee leg amputation Right 2004  . Tubal ligation  1973  . Reduction mammaplasty Bilateral 1987  . Foot surgery Left 1980's    "ulcer removed"  . Toe amputation Left ~ 2011    "top of my 3rd toe"  . Amputation Left 10/29/2014    Procedure: AMPUTATION BELOW KNEE - LEFT;  Surgeon: Newt Minion, MD;  Location: [facility];  Service: Orthopedics;  Laterality: Left;    There were no vitals filed for this visit.  Visit Diagnosis:  Abnormality of gait  Impaired functional mobility and activity tolerance      Subjective Assessment - 03/06/15 0852    Symptoms No new complaints. No falls or pain to report. Saw prosthetist Mortimer Fries) yesterday, he adjusted left foot angle after pt reported feeling like she was walking on her  Feels better now.   Currently in Pain? No/denies   Pain Score 0-No pain          [facility] Adult PT Treatment/Exercise - 03/06/15 0907    Transfers   Sit to Stand 5: Supervision;With upper extremity  assist;From chair/3-in-1;With armrests   Stand to Sit 5: Supervision;With upper extremity assist;To chair/3-in-1;With armrests   Ambulation/Gait   Ambulation/Gait Yes   Ambulation/Gait Assistance 4: Min guard;4: Min assist   Ambulation Distance (Feet) 220 Feet  x 3   Assistive device Prostheses;Straight cane   Gait Pattern Step-through pattern;Decreased step length - left;Decreased stance time - right;Decreased hip/knee flexion - right;Decreased hip/knee flexion - left;Right steppage;Left steppage;Decreased trunk rotation;Trunk flexed;Wide base of support;Poor foot clearance - left   Ambulation Surface Level;Indoor   Stairs Yes   Stairs Assistance 4: Min guard   Stairs Assistance Details (indicate cue type and reason) cues on sequence, foot placement with descending stairs.   Stair Management Technique One rail Right;One rail Left;Step to pattern;Alternating pattern;With cane   Number of Stairs 4  x 1 rep with each rail used with cane   Ramp 4: Min assist  x 2 reps with prostheses/cane   Ramp Details (indicate cue type and reason) cues on sequence and posture   Curb 4: Min assist  x2 reps with prostheses/cane   Curb Details (indicate cue type and reason) cues on foot placement and posture   Knee/Hip Exercises: Aerobic   Stationary Bike Scifit x 4 extremities level 2.0 x 8 minutes with goal rpm >/=60 for strengthening and activity tolerance.  Prosthetics   Current prosthetic wear tolerance (days/week)  7 days   Current prosthetic wear tolerance (#hours/day)  wearing bilateral prostheses >90% awake hours   Residual limb condition  wound covered with Tegaderm, healing   Education Provided Correct ply sock adjustment;Proper wear schedule/adjustment   Person(s) Educated Patient   Education Method Explanation   Education Method Verbalized understanding   Donning Prosthesis Supervision   Doffing Prosthesis Supervision           PT Short Term Goals - 02/15/15  0800    PT SHORT TERM GOAL #1   Title demonstrates proper donning of prostheses (Target Date: 03/16/15)   Time 4   Period Weeks   Status New   PT SHORT TERM GOAL #2   Title tolerates wear of bilateral prostheses >90% of awake hours with no skin issues and discomfort <2/10 (Target Date: 03/16/15)   Time 4   Period Weeks   Status New   PT SHORT TERM GOAL #3   Title ambulates 200' with [facility] & prostheses modified independent. (Target Date: 03/16/15)   Time 4   Period Weeks   Status New   PT SHORT TERM GOAL #4   Title negotiates ramp & curb with [facility] & prostheses with supervision. (Target Date: 03/16/15)   Time 4   Period Weeks   Status New   PT SHORT TERM GOAL #5   Title manages clothes including pants for toileting and jacket in standing safely modified independent. (Target Date: 03/16/15)   Time 4   Period Weeks   Status New           PT Long Term Goals - 02/15/15 0800    PT LONG TERM GOAL #1   Title independent with prosthetic care. (Target Date: 04/13/15)   Time 8   Period Weeks   Status New   PT LONG TERM GOAL #2   Title wears bilateral prostheses >90% of awake hours with no skin issues or pain.  (Target Date: 04/13/15)   Time 8   Period Weeks   Status New   PT LONG TERM GOAL #3   Title ambulates 100' with bilateral prostheses carrying plate & cup modified independent.  (Target Date: 04/13/15)   Time 8   Period Weeks   Status New   PT LONG TERM GOAL #4   Title ambulates 300' with LRAD & bilateral prostheses modified independent.  (Target Date: 04/13/15)   Time 8   Period Weeks   Status New   PT LONG TERM GOAL #5   Title negotiates ramp, curb & stairs with LRAD & bilateral prostheses modified independent.  (Target Date: 04/13/15)   Time 8   Period Weeks   Status New   Additional Long Term Goals   Additional Long Term Goals Yes   PT LONG TERM GOAL #6   Title Timed Up & Go with prostheses only <13.5 seconds  (Target Date: 04/13/15)   Time 8   Period Weeks   Status  New   PT LONG TERM GOAL #7   Title Berg Balance >30/56  (Target Date: 04/13/15)   Time 8   Period Weeks   Status New           Plan - 03/06/15 0853    Clinical Impression Statement Pt making steady progress toward her goals with increased activity tolerance today.   Pt will benefit from skilled therapeutic intervention in order to improve on the following deficits Abnormal gait;Decreased activity tolerance;Decreased balance;Decreased endurance;Decreased knowledge of use  of DME;Decreased mobility;Decreased range of motion;Decreased strength;Other (comment)  prosthetic dependency   Rehab Potential Good   PT Frequency 2x / week   PT Duration 8 weeks   PT Treatment/Interventions ADLs/Self Care Home Management;DME Instruction;Gait training;Stair training;Functional mobility training;Therapeutic activities;Therapeutic exercise;Balance training;Neuromuscular re-education;Patient/family education;Other (comment)  prosthetic training   PT Next Visit Plan review prosthetic care, gait with single point cane including barriers   PT Home Exercise Plan balance HEP   Consulted and Agree with Plan of Care Patient        Problem List Patient Active Problem List   Diagnosis Date Noted  . Gastroparesis 11/02/2014  . Diabetes mellitus due to underlying condition with other specified complication   . Diabetic foot   . Nausea & vomiting   . Essential hypertension 10/25/2014  . GERD (gastroesophageal reflux disease) 10/25/2014  . Cellulitis 10/24/2014  . Diabetes 10/24/2014  . Charcot foot due to diabetes mellitus 10/24/2014    Willow Ora 03/06/2015, 9:30 AM  Willow Ora, PTA, [facility] [address] [place], [place] [address] [phone] 03/06/2015, 9:30 AM

## 2015-03-12 ENCOUNTER — Ambulatory Visit: Payer: Commercial Managed Care - HMO | Admitting: Physical Therapy

## 2015-03-12 ENCOUNTER — Encounter: Payer: Self-pay | Admitting: Physical Therapy

## 2015-03-12 DIAGNOSIS — Z4781 Encounter for orthopedic aftercare following surgical amputation: Secondary | ICD-10-CM | POA: Diagnosis not present

## 2015-03-12 DIAGNOSIS — Z89611 Acquired absence of right leg above knee: Secondary | ICD-10-CM | POA: Diagnosis not present

## 2015-03-12 DIAGNOSIS — Z89612 Acquired absence of left leg above knee: Secondary | ICD-10-CM | POA: Diagnosis not present

## 2015-03-12 DIAGNOSIS — Z7409 Other reduced mobility: Secondary | ICD-10-CM

## 2015-03-12 DIAGNOSIS — R269 Unspecified abnormalities of gait and mobility: Secondary | ICD-10-CM | POA: Diagnosis not present

## 2015-03-12 NOTE — Therapy (Signed)
[facility] [address] [place], [place], [address] Phone: [phone]   Fax:  [phone]  Physical Therapy Treatment  Patient Details  Name: Diana Coleman MRN: [id number] Date of Birth: Jul 03, 1951 Referring Provider:  Lucianne Lei, MD  Encounter Date: 03/12/2015      PT End of Session - 03/12/15 0808    Visit Number 6   Number of Visits 17   Date for PT Re-Evaluation 04/13/15   PT Start Time 0805   PT Stop Time 0845   PT Time Calculation (min) 40 min   Equipment Utilized During Treatment Gait belt   Activity Tolerance Patient tolerated treatment well   Behavior During Therapy [facility] for tasks assessed/performed      Past Medical History  Diagnosis Date  . Type II diabetes mellitus   . Charcot foot due to diabetes mellitus   . Cellulitis of foot, left 10/24/2014    hx/notes 10/24/2014  . Osteomyelitis of right foot     hx/notes 10/24/2014    Past Surgical History  Procedure Laterality Date  . Above knee leg amputation Right 2004  . Tubal ligation  1973  . Reduction mammaplasty Bilateral 1987  . Foot surgery Left 1980's    "ulcer removed"  . Toe amputation Left ~ 2011    "top of my 3rd toe"  . Amputation Left 10/29/2014    Procedure: AMPUTATION BELOW KNEE - LEFT;  Surgeon: Newt Minion, MD;  Location: [facility];  Service: Orthopedics;  Laterality: Left;    There were no vitals filed for this visit.  Visit Diagnosis:  Abnormality of gait  Impaired functional mobility and activity tolerance      Subjective Assessment - 03/12/15 0807    Symptoms No new complaints. No falls or pain to report.   Currently in Pain? No/denies   Pain Score 0-No pain           [facility] Adult PT Treatment/Exercise - 03/12/15 0808    Transfers   Sit to Stand 5: Supervision;With upper extremity assist;From chair/3-in-1;With armrests   Stand to Sit 5: Supervision;With upper extremity assist;To chair/3-in-1;With armrests    Ambulation/Gait   Ambulation/Gait Yes   Ambulation/Gait Assistance 4: Min guard   Ambulation Distance (Feet) 220 Feet  x 2   Assistive device Prostheses;Straight cane   Gait Pattern Step-through pattern;Decreased stride length;Decreased step length - right;Decreased step length - left;Wide base of support;Decreased trunk rotation   Ambulation Surface Level;Indoor   Stairs Yes   Stairs Assistance 4: Min guard   Stairs Assistance Details (indicate cue type and reason) cues on foot placement with decending: step to first time, reciprocal 2cd time   Stair Management Technique One rail Left;With cane;Step to pattern;Alternating pattern;Forwards   Number of Stairs 4  x2 reps   Ramp 4: Min assist  with cane/prostheses   Ramp Details (indicate cue type and reason) cues on posture and technique   Curb 4: Min assist  with cane/prostheses   Curb Details (indicate cue type and reason) cues on posture and sequence   Prosthetics   Prosthetic Care Comments  Tegaderm removed from wound and wound cleaned up due to too much moisture present. Pt to change wear time of left prosthesis to 2 hours on, 2 hours off rotation for next week to allow more air to get to wound for better healing. right prosthesis found to be 1/4 inch shorter than left. Call placed to [facility] with [employer] to get pt in to have  this adjusted.                       Current prosthetic wear tolerance (days/week)  7 days   Current prosthetic wear tolerance (#hours/day)  wearing bilateral prostheses >90% awake hours   Residual limb condition  wound covered with Tegaderm, healing   Education Provided Residual limb care;Proper wear schedule/adjustment;Proper weight-bearing schedule/adjustment  wound management   Person(s) Educated Patient   Education Method Explanation;Verbal cues   Education Method Verbalized understanding   Donning Prosthesis Supervision   Doffing Prosthesis Supervision           PT Short Term Goals - 02/15/15 0800     PT SHORT TERM GOAL #1   Title demonstrates proper donning of prostheses (Target Date: 03/16/15)   Time 4   Period Weeks   Status New   PT SHORT TERM GOAL #2   Title tolerates wear of bilateral prostheses >90% of awake hours with no skin issues and discomfort <2/10 (Target Date: 03/16/15)   Time 4   Period Weeks   Status New   PT SHORT TERM GOAL #3   Title ambulates 200' with [facility] & prostheses modified independent. (Target Date: 03/16/15)   Time 4   Period Weeks   Status New   PT SHORT TERM GOAL #4   Title negotiates ramp & curb with [facility] & prostheses with supervision. (Target Date: 03/16/15)   Time 4   Period Weeks   Status New   PT SHORT TERM GOAL #5   Title manages clothes including pants for toileting and jacket in standing safely modified independent. (Target Date: 03/16/15)   Time 4   Period Weeks   Status New           PT Long Term Goals - 02/15/15 0800    PT LONG TERM GOAL #1   Title independent with prosthetic care. (Target Date: 04/13/15)   Time 8   Period Weeks   Status New   PT LONG TERM GOAL #2   Title wears bilateral prostheses >90% of awake hours with no skin issues or pain.  (Target Date: 04/13/15)   Time 8   Period Weeks   Status New   PT LONG TERM GOAL #3   Title ambulates 100' with bilateral prostheses carrying plate & cup modified independent.  (Target Date: 04/13/15)   Time 8   Period Weeks   Status New   PT LONG TERM GOAL #4   Title ambulates 300' with LRAD & bilateral prostheses modified independent.  (Target Date: 04/13/15)   Time 8   Period Weeks   Status New   PT LONG TERM GOAL #5   Title negotiates ramp, curb & stairs with LRAD & bilateral prostheses modified independent.  (Target Date: 04/13/15)   Time 8   Period Weeks   Status New   Additional Long Term Goals   Additional Long Term Goals Yes   PT LONG TERM GOAL #6   Title Timed Up & Go with prostheses only <13.5 seconds  (Target Date: 04/13/15)   Time 8   Period Weeks   Status New    PT LONG TERM GOAL #7   Title Berg Balance >30/56  (Target Date: 04/13/15)   Time 8   Period Weeks   Status New           Plan - 03/12/15 0808    Clinical Impression Statement Pt continues to make steady progress toward her goals. Changed pt's left  prosthesis wearing schedule in hopes to get wound healing better.   Pt will benefit from skilled therapeutic intervention in order to improve on the following deficits Abnormal gait;Decreased activity tolerance;Decreased balance;Decreased endurance;Decreased knowledge of use of DME;Decreased mobility;Decreased range of motion;Decreased strength;Other (comment)  prosthetic dependency   Rehab Potential Good   PT Frequency 2x / week   PT Duration 8 weeks   PT Treatment/Interventions ADLs/Self Care Home Management;DME Instruction;Gait training;Stair training;Functional mobility training;Therapeutic activities;Therapeutic exercise;Balance training;Neuromuscular re-education;Patient/family education;Other (comment)  prosthetic training   PT Next Visit Plan review prosthetic care, gait with single point cane including barriers   PT Home Exercise Plan balance HEP   Consulted and Agree with Plan of Care Patient      Problem List Patient Active Problem List   Diagnosis Date Noted  . Gastroparesis 11/02/2014  . Diabetes mellitus due to underlying condition with other specified complication   . Diabetic foot   . Nausea & vomiting   . Essential hypertension 10/25/2014  . GERD (gastroesophageal reflux disease) 10/25/2014  . Cellulitis 10/24/2014  . Diabetes 10/24/2014  . Charcot foot due to diabetes mellitus 10/24/2014    Willow Ora 03/12/2015, 8:48 AM  Willow Ora, PTA, [facility] [address] [place], [place] [address] [phone] 03/12/2015, 8:48 AM

## 2015-03-13 ENCOUNTER — Ambulatory Visit: Payer: Commercial Managed Care - HMO | Admitting: Physical Therapy

## 2015-03-13 ENCOUNTER — Encounter: Payer: Self-pay | Admitting: Physical Therapy

## 2015-03-13 DIAGNOSIS — I10 Essential (primary) hypertension: Secondary | ICD-10-CM | POA: Diagnosis not present

## 2015-03-13 DIAGNOSIS — J301 Allergic rhinitis due to pollen: Secondary | ICD-10-CM | POA: Diagnosis not present

## 2015-03-13 DIAGNOSIS — E1140 Type 2 diabetes mellitus with diabetic neuropathy, unspecified: Secondary | ICD-10-CM | POA: Diagnosis not present

## 2015-03-13 DIAGNOSIS — Z7409 Other reduced mobility: Secondary | ICD-10-CM

## 2015-03-13 DIAGNOSIS — E1100 Type 2 diabetes mellitus with hyperosmolarity without nonketotic hyperglycemic-hyperosmolar coma (NKHHC): Secondary | ICD-10-CM | POA: Diagnosis not present

## 2015-03-13 DIAGNOSIS — Z4781 Encounter for orthopedic aftercare following surgical amputation: Secondary | ICD-10-CM | POA: Diagnosis not present

## 2015-03-13 DIAGNOSIS — Z89611 Acquired absence of right leg above knee: Secondary | ICD-10-CM | POA: Diagnosis not present

## 2015-03-13 DIAGNOSIS — R269 Unspecified abnormalities of gait and mobility: Secondary | ICD-10-CM

## 2015-03-13 DIAGNOSIS — E0951 Drug or chemical induced diabetes mellitus with diabetic peripheral angiopathy without gangrene: Secondary | ICD-10-CM | POA: Diagnosis not present

## 2015-03-13 DIAGNOSIS — Z89612 Acquired absence of left leg above knee: Secondary | ICD-10-CM | POA: Diagnosis not present

## 2015-03-13 NOTE — Therapy (Signed)
[facility] [address] [place], [place], [address] Phone: [phone]   Fax:  [phone]  Physical Therapy Treatment  Patient Details  Name: Diana Coleman MRN: [id number] Date of Birth: 1951-03-26 Referring Provider:  Lucianne Lei, MD  Encounter Date: 03/13/2015      PT End of Session - 03/13/15 0811    Visit Number 7   Number of Visits 17   Date for PT Re-Evaluation 04/13/15   PT Start Time 0800   PT Stop Time 0843   PT Time Calculation (min) 43 min   Equipment Utilized During Treatment Gait belt   Activity Tolerance Patient tolerated treatment well   Behavior During Therapy [facility] for tasks assessed/performed      Past Medical History  Diagnosis Date  . Type II diabetes mellitus   . Charcot foot due to diabetes mellitus   . Cellulitis of foot, left 10/24/2014    hx/notes 10/24/2014  . Osteomyelitis of right foot     hx/notes 10/24/2014    Past Surgical History  Procedure Laterality Date  . Above knee leg amputation Right 2004  . Tubal ligation  1973  . Reduction mammaplasty Bilateral 1987  . Foot surgery Left 1980's    "ulcer removed"  . Toe amputation Left ~ 2011    "top of my 3rd toe"  . Amputation Left 10/29/2014    Procedure: AMPUTATION BELOW KNEE - LEFT;  Surgeon: Newt Minion, MD;  Location: [facility];  Service: Orthopedics;  Laterality: Left;    There were no vitals filed for this visit.  Visit Diagnosis:  Abnormality of gait  Impaired functional mobility and activity tolerance      Subjective Assessment - 03/13/15 0809    Symptoms No new complaints. No falls. Has started the new wear schedule of 2 hours on, 2 hours off rotation and reports the wound already looks better. Saw Bobby and had her prosthesis height adjusted, now as a "hitch" in her right side/hip.   Currently in Pain? No/denies   Pain Score 0-No pain           [facility] Adult PT Treatment/Exercise - 03/13/15 2542    Transfers    Sit to Stand 5: Supervision;With upper extremity assist;From chair/3-in-1;With armrests   Stand to Sit 5: Supervision;With upper extremity assist;To chair/3-in-1;With armrests   Ambulation/Gait   Ambulation/Gait Yes   Ambulation/Gait Assistance 4: Min guard;4: Min assist   Ambulation/Gait Assistance Details cues on posture,cane placement, to decrease circumduction with swing phase. Loss of balance x 1 due to right toe scuff with gait with distraction needing assist to correct balance and prevent fall forward.                  Ambulation Distance (Feet) 220 Feet  x 2   Assistive device Prostheses;Straight cane   Gait Pattern Step-through pattern;Decreased stride length;Decreased step length - right;Decreased step length - left;Wide base of support;Decreased trunk rotation   Ambulation Surface Level;Indoor   Ramp 4: Min assist  prostheses/cane   Ramp Details (indicate cue type and reason) cues on posture and technique   Curb 4: Min assist  prostheses/cane   Curb Details (indicate cue type and reason) cues on posture, sequence and foot placement for curb management   High Level Balance   High Level Balance Activities Backward walking;Side stepping;Marching forwards;Tandem walking  tandem fwd/bwd,    High Level Balance Comments 3 laps each/each way with UE support on counter and with cane, min  guard assist   Knee/Hip Exercises: Aerobic   Stationary Bike Scifit x 4 extremities level 3.0 x 8 minutes with goal rpm >/=60 for strengthening and activity tolerance.                        Prosthetics   Current prosthetic wear tolerance (days/week)  7 days   Current prosthetic wear tolerance (#hours/day)  wearing bilateral prostheses >90% awake hours  2 hour rotation of on/off to left prosthesis   Residual limb condition  wound covered with Tegaderm, healing   Donning Prosthesis Supervision   Doffing Prosthesis Supervision            PT Short Term Goals - 02/15/15 0800    PT SHORT TERM  GOAL #1   Title demonstrates proper donning of prostheses (Target Date: 03/16/15)   Time 4   Period Weeks   Status New   PT SHORT TERM GOAL #2   Title tolerates wear of bilateral prostheses >90% of awake hours with no skin issues and discomfort <2/10 (Target Date: 03/16/15)   Time 4   Period Weeks   Status New   PT SHORT TERM GOAL #3   Title ambulates 200' with [facility] & prostheses modified independent. (Target Date: 03/16/15)   Time 4   Period Weeks   Status New   PT SHORT TERM GOAL #4   Title negotiates ramp & curb with [facility] & prostheses with supervision. (Target Date: 03/16/15)   Time 4   Period Weeks   Status New   PT SHORT TERM GOAL #5   Title manages clothes including pants for toileting and jacket in standing safely modified independent. (Target Date: 03/16/15)   Time 4   Period Weeks   Status New           PT Long Term Goals - 02/15/15 0800    PT LONG TERM GOAL #1   Title independent with prosthetic care. (Target Date: 04/13/15)   Time 8   Period Weeks   Status New   PT LONG TERM GOAL #2   Title wears bilateral prostheses >90% of awake hours with no skin issues or pain.  (Target Date: 04/13/15)   Time 8   Period Weeks   Status New   PT LONG TERM GOAL #3   Title ambulates 100' with bilateral prostheses carrying plate & cup modified independent.  (Target Date: 04/13/15)   Time 8   Period Weeks   Status New   PT LONG TERM GOAL #4   Title ambulates 300' with LRAD & bilateral prostheses modified independent.  (Target Date: 04/13/15)   Time 8   Period Weeks   Status New   PT LONG TERM GOAL #5   Title negotiates ramp, curb & stairs with LRAD & bilateral prostheses modified independent.  (Target Date: 04/13/15)   Time 8   Period Weeks   Status New   Additional Long Term Goals   Additional Long Term Goals Yes   PT LONG TERM GOAL #6   Title Timed Up & Go with prostheses only <13.5 seconds  (Target Date: 04/13/15)   Time 8   Period Weeks   Status New   PT LONG TERM GOAL  #7   Title Berg Balance >30/56  (Target Date: 04/13/15)   Time 8   Period Weeks   Status New          Plan - 03/13/15 6712    Clinical Impression Statement Pt making steady  progress toward goals. Was challenged by balance activities today and gait with mulittaskining/distractions (loss of balance needing assist to correct).   Pt will benefit from skilled therapeutic intervention in order to improve on the following deficits Abnormal gait;Decreased activity tolerance;Decreased balance;Decreased endurance;Decreased knowledge of use of DME;Decreased mobility;Decreased range of motion;Decreased strength;Other (comment)  prosthetic dependency   Rehab Potential Good   PT Frequency 2x / week   PT Duration 8 weeks   PT Treatment/Interventions ADLs/Self Care Home Management;DME Instruction;Gait training;Stair training;Functional mobility training;Therapeutic activities;Therapeutic exercise;Balance training;Neuromuscular re-education;Patient/family education;Other (comment)  prosthetic training   PT Next Visit Plan review prosthetic care, gait with single point cane including barriers   PT Home Exercise Plan balance HEP   Consulted and Agree with Plan of Care Patient       Problem List Patient Active Problem List   Diagnosis Date Noted  . Gastroparesis 11/02/2014  . Diabetes mellitus due to underlying condition with other specified complication   . Diabetic foot   . Nausea & vomiting   . Essential hypertension 10/25/2014  . GERD (gastroesophageal reflux disease) 10/25/2014  . Cellulitis 10/24/2014  . Diabetes 10/24/2014  . Charcot foot due to diabetes mellitus 10/24/2014    Willow Ora 03/13/2015, 8:46 AM  Willow Ora, PTA, [facility] [address], [place], [place] [address] [phone] 03/13/2015, 8:46 AM

## 2015-03-19 ENCOUNTER — Encounter: Payer: Self-pay | Admitting: Physical Therapy

## 2015-03-19 ENCOUNTER — Ambulatory Visit: Payer: Commercial Managed Care - HMO | Admitting: Physical Therapy

## 2015-03-19 DIAGNOSIS — R269 Unspecified abnormalities of gait and mobility: Secondary | ICD-10-CM

## 2015-03-19 DIAGNOSIS — Z4781 Encounter for orthopedic aftercare following surgical amputation: Secondary | ICD-10-CM | POA: Diagnosis not present

## 2015-03-19 DIAGNOSIS — R2689 Other abnormalities of gait and mobility: Secondary | ICD-10-CM

## 2015-03-19 DIAGNOSIS — Z89612 Acquired absence of left leg above knee: Secondary | ICD-10-CM | POA: Diagnosis not present

## 2015-03-19 DIAGNOSIS — Z7409 Other reduced mobility: Secondary | ICD-10-CM

## 2015-03-19 DIAGNOSIS — Z89611 Acquired absence of right leg above knee: Secondary | ICD-10-CM | POA: Diagnosis not present

## 2015-03-19 NOTE — Therapy (Signed)
[facility] [address] [place], [place], [address] Phone: [phone]   Fax:  [phone]  Physical Therapy Treatment  Patient Details  Name: Diana Coleman MRN: [id number] Date of Birth: 1951-07-29 Referring Provider:  Lucianne Lei, MD  Encounter Date: 03/19/2015      PT End of Session - 03/19/15 0808    Visit Number 8   Number of Visits 17   Date for PT Re-Evaluation 04/13/15   PT Start Time 0803   PT Stop Time 0845   PT Time Calculation (min) 42 min   Equipment Utilized During Treatment Gait belt   Activity Tolerance Patient tolerated treatment well   Behavior During Therapy [facility] for tasks assessed/performed      Past Medical History  Diagnosis Date  . Type II diabetes mellitus   . Charcot foot due to diabetes mellitus   . Cellulitis of foot, left 10/24/2014    hx/notes 10/24/2014  . Osteomyelitis of right foot     hx/notes 10/24/2014    Past Surgical History  Procedure Laterality Date  . Above knee leg amputation Right 2004  . Tubal ligation  1973  . Reduction mammaplasty Bilateral 1987  . Foot surgery Left 1980's    "ulcer removed"  . Toe amputation Left ~ 2011    "top of my 3rd toe"  . Amputation Left 10/29/2014    Procedure: AMPUTATION BELOW KNEE - LEFT;  Surgeon: Newt Minion, MD;  Location: [facility];  Service: Orthopedics;  Laterality: Left;    There were no vitals filed for this visit.  Visit Diagnosis:  Abnormality of gait  Balance problems  Impaired functional mobility and activity tolerance      Subjective Assessment - 03/19/15 0808    Symptoms No new complaints. No falls or pain to report.   Currently in Pain? No/denies   Pain Score 0-No pain           [facility] Adult PT Treatment/Exercise - 03/19/15 0809    Transfers   Sit to Stand 5: Supervision;With upper extremity assist;From chair/3-in-1;With armrests   Stand to Sit 5: Supervision;With upper extremity assist;To  chair/3-in-1;With armrests   Ambulation/Gait   Ambulation/Gait Yes   Ambulation/Gait Assistance 5: Supervision   Ambulation/Gait Assistance Details cues on posture and to correct gait deviations. Increased guarding with straight cane due to decreased steadiness with gai.                                        Ambulation Distance (Feet) 440 Feet  x1 LBQC; 220 x1 straight cane   Assistive device Prostheses;Straight cane;Large base quad cane   Gait Pattern Step-through pattern;Decreased stride length;Decreased step length - right;Decreased step length - left;Wide base of support;Decreased trunk rotation   Ambulation Surface Level;Indoor   Ramp --  min guard assist with LBQC/prostheses   Ramp Details (indicate cue type and reason) cues on posture and sequence   Curb 4: Min assist  LBQC/prostheses   Curb Details (indicate cue type and reason) cues on sequence and foot placement   Static Standing Balance   Static Standing - Balance Support No upper extremity supported;During functional activity   Static Standing - Level of Assistance 4: Min assist   Static Standing - Comment/# of Minutes standing in parallel bars with red theraband: rows and shoulder extension x 10 reps each. cues on posture and ex form.  High Level Balance   High Level Balance Activities Backward walking;Side stepping;Marching forwards;Tandem walking  tandem gait performed forward/backward   High Level Balance Comments 3 laps each/each way with UE support on bars as needed, min guard assist. cues on posture.   Prosthetics   Prosthetic Care Comments  Tegaderm intact. Wound appears to be healing. Still with some bloody drainage. Pt to continue with 2 hours on, 2 hours off wear schedule.               Current prosthetic wear tolerance (days/week)  7 days   Current prosthetic wear tolerance (#hours/day)  wearing bilateral prostheses >90% awake hours   Residual limb condition  wound covered with Tegaderm, healing    Education Provided Residual limb care;Correct ply sock adjustment;Proper wear schedule/adjustment   Person(s) Educated Patient   Education Method Explanation   Education Method Verbalized understanding   Donning Prosthesis Modified independent (device/increased time)   Doffing Prosthesis Modified independent (device/increased time)           PT Short Term Goals - 03/19/15 1302    PT SHORT TERM GOAL #1   Title demonstrates proper donning of prostheses (Target Date: 03/16/15)   Time 4   Period Weeks   Status Achieved   PT SHORT TERM GOAL #2   Title tolerates wear of bilateral prostheses >90% of awake hours with no skin issues and discomfort <2/10 (Target Date: 03/16/15)   Baseline 03/19/15- not met for left leg, met for right leg   Time 4   Period Weeks   Status Not Met   PT SHORT TERM GOAL #3   Title ambulates 200' with [facility] & prostheses modified independent. (Target Date: 03/16/15)   Time 4   Period Weeks   Status Achieved   PT SHORT TERM GOAL #4   Title negotiates ramp & curb with [facility] & prostheses with supervision. (Target Date: 03/16/15)   Baseline 03/19/15-still needed assist for balance at times   Time 4   Period Weeks   Status Not Met   PT SHORT TERM GOAL #5   Title manages clothes including pants for toileting and jacket in standing safely modified independent. (Target Date: 03/16/15)   Time 4   Period Weeks   Status Achieved           PT Long Term Goals - 02/15/15 0800    PT LONG TERM GOAL #1   Title independent with prosthetic care. (Target Date: 04/13/15)   Time 8   Period Weeks   Status New   PT LONG TERM GOAL #2   Title wears bilateral prostheses >90% of awake hours with no skin issues or pain.  (Target Date: 04/13/15)   Time 8   Period Weeks   Status New   PT LONG TERM GOAL #3   Title ambulates 100' with bilateral prostheses carrying plate & cup modified independent.  (Target Date: 04/13/15)   Time 8   Period Weeks   Status New   PT LONG TERM GOAL #4    Title ambulates 300' with LRAD & bilateral prostheses modified independent.  (Target Date: 04/13/15)   Time 8   Period Weeks   Status New   PT LONG TERM GOAL #5   Title negotiates ramp, curb & stairs with LRAD & bilateral prostheses modified independent.  (Target Date: 04/13/15)   Time 8   Period Weeks   Status New   Additional Long Term Goals   Additional Long Term Goals Yes   PT  LONG TERM GOAL #6   Title Timed Up & Go with prostheses only <13.5 seconds  (Target Date: 04/13/15)   Time 8   Period Weeks   Status New   PT LONG TERM GOAL #7   Title Berg Balance >30/56  (Target Date: 04/13/15)   Time 8   Period Weeks   Status New           Plan - 03/19/15 0808    Clinical Impression Statement Pt met most STG's today, progressing toward others and LTG's.   Pt will benefit from skilled therapeutic intervention in order to improve on the following deficits Abnormal gait;Decreased activity tolerance;Decreased balance;Decreased endurance;Decreased knowledge of use of DME;Decreased mobility;Decreased range of motion;Decreased strength;Other (comment)  prosthetic dependency   Rehab Potential Good   PT Frequency 2x / week   PT Duration 8 weeks   PT Treatment/Interventions ADLs/Self Care Home Management;DME Instruction;Gait training;Stair training;Functional mobility training;Therapeutic activities;Therapeutic exercise;Balance training;Neuromuscular re-education;Patient/family education;Other (comment)  prosthetic training   PT Next Visit Plan review prosthetic care, gait with single point cane including barriers   PT Home Exercise Plan balance HEP   Consulted and Agree with Plan of Care Patient        Problem List Patient Active Problem List   Diagnosis Date Noted  . Gastroparesis 11/02/2014  . Diabetes mellitus due to underlying condition with other specified complication   . Diabetic foot   . Nausea & vomiting   . Essential hypertension 10/25/2014  . GERD (gastroesophageal  reflux disease) 10/25/2014  . Cellulitis 10/24/2014  . Diabetes 10/24/2014  . Charcot foot due to diabetes mellitus 10/24/2014    Willow Ora 03/19/2015, 1:03 PM  Willow Ora, PTA, [facility] [address] [place], [place] [address] [phone] 03/19/2015, 1:03 PM

## 2015-03-20 ENCOUNTER — Encounter: Payer: Self-pay | Admitting: Physical Therapy

## 2015-03-20 ENCOUNTER — Ambulatory Visit: Payer: Commercial Managed Care - HMO | Admitting: Physical Therapy

## 2015-03-20 DIAGNOSIS — R269 Unspecified abnormalities of gait and mobility: Secondary | ICD-10-CM

## 2015-03-20 DIAGNOSIS — Z7409 Other reduced mobility: Secondary | ICD-10-CM

## 2015-03-20 DIAGNOSIS — Z89512 Acquired absence of left leg below knee: Secondary | ICD-10-CM

## 2015-03-20 DIAGNOSIS — Z89511 Acquired absence of right leg below knee: Secondary | ICD-10-CM

## 2015-03-20 DIAGNOSIS — Z4781 Encounter for orthopedic aftercare following surgical amputation: Secondary | ICD-10-CM | POA: Diagnosis not present

## 2015-03-20 DIAGNOSIS — Z89612 Acquired absence of left leg above knee: Secondary | ICD-10-CM | POA: Diagnosis not present

## 2015-03-20 DIAGNOSIS — Z89611 Acquired absence of right leg above knee: Secondary | ICD-10-CM | POA: Diagnosis not present

## 2015-03-20 DIAGNOSIS — R2689 Other abnormalities of gait and mobility: Secondary | ICD-10-CM

## 2015-03-20 NOTE — Therapy (Signed)
[facility] [address] [place], [place], [address] Phone: [phone]   Fax:  [phone]  Physical Therapy Treatment  Patient Details  Name: Diana Coleman MRN: [id number] Date of Birth: 17-Oct-1951 Referring Provider:  Lucianne Lei, MD  Encounter Date: 03/20/2015      PT End of Session - 03/20/15 1315    Visit Number 9   Number of Visits 17   Date for PT Re-Evaluation 04/13/15   PT Start Time 1230   PT Stop Time 1315   PT Time Calculation (min) 45 min   Equipment Utilized During Treatment Gait belt   Activity Tolerance Patient tolerated treatment well   Behavior During Therapy [facility] for tasks assessed/performed      Past Medical History  Diagnosis Date  . Type II diabetes mellitus   . Charcot foot due to diabetes mellitus   . Cellulitis of foot, left 10/24/2014    hx/notes 10/24/2014  . Osteomyelitis of right foot     hx/notes 10/24/2014    Past Surgical History  Procedure Laterality Date  . Above knee leg amputation Right 2004  . Tubal ligation  1973  . Reduction mammaplasty Bilateral 1987  . Foot surgery Left 1980's    "ulcer removed"  . Toe amputation Left ~ 2011    "top of my 3rd toe"  . Amputation Left 10/29/2014    Procedure: AMPUTATION BELOW KNEE - LEFT;  Surgeon: Newt Minion, MD;  Location: [facility];  Service: Orthopedics;  Laterality: Left;    There were no vitals filed for this visit.  Visit Diagnosis:  Abnormality of gait  Balance problems  Impaired functional mobility and activity tolerance  Status post bilateral below knee amputation      Subjective Assessment - 03/20/15 1236    Symptoms wearing 2 hours on /2 hours off when out of house, but once she knows she is home for the day removes prostheses.   Currently in Pain? No/denies                       [facility] Adult PT Treatment/Exercise - 03/20/15 1230    Transfers   Sit to Stand 5: Supervision;With upper extremity  assist;From chair/3-in-1  chair without armrests   Sit to Stand Details (indicate cue type and reason) cues on technique without armrests   Stand to Sit 5: Supervision;With upper extremity assist;To chair/3-in-1  without armrests   Stand to Sit Details cues on chairs without armrests   Ambulation/Gait   Ambulation/Gait Yes   Ambulation/Gait Assistance 5: Supervision;4: Min assist  SBA LBQC, MinA no device   Ambulation/Gait Assistance Details cues on turning figure 8s and turning 180 degrees   Ambulation Distance (Feet) 500 Feet  x1 LBQC; 120 x2 no device   Assistive device Prostheses;Large base quad cane   Gait Pattern Step-through pattern;Decreased stride length;Decreased step length - right;Decreased step length - left;Wide base of support;Decreased trunk rotation   Ambulation Surface Level;Indoor   Ramp --   Curb 4: Min assist  4" step with only prostheses   Curb Details (indicate cue type and reason) demo / cues on technique / step length   Static Standing Balance   Static Standing - Balance Support No upper extremity supported;During functional activity   Static Standing - Level of Assistance 4: Min assist   Static Standing - Comment/# of Minutes cues on foot position to increase stability   High Level Balance   High Level Balance  Activities Backward walking;Side stepping;Marching forwards;Tandem walking  tandem gait performed forward/backward, counter support   High Level Balance Comments 3 laps each/each way with UE support on counter as needed, min guard assist. cues on posture.   Prosthetics   Prosthetic Care Comments  Tegaderm intact. Wound appears to be healing. Still with some bloody drainage. Pt to continue with 2 hours on, 2 hours off wear schedule.               Current prosthetic wear tolerance (days/week)  7 days   Current prosthetic wear tolerance (#hours/day)  PT advised to wear right prosthesis all awake hours, and left prosthesis 2 hours on 2 hours off limiting  wear to allow wound to heal.    Residual limb condition  wound covered with Tegaderm, healing   Education Provided Residual limb care;Correct ply sock adjustment;Proper wear schedule/adjustment   Person(s) Educated Patient   Education Method Explanation   Education Method Verbalized understanding   Donning Prosthesis Modified independent (device/increased time)   Doffing Prosthesis Modified independent (device/increased time)                PT Education - 03/20/15 1315    Education provided Yes   Education Details see prosthetic care   Person(s) Educated Patient   Methods Explanation   Comprehension Verbalized understanding          PT Short Term Goals - 03/19/15 1302    PT SHORT TERM GOAL #1   Title demonstrates proper donning of prostheses (Target Date: 03/16/15)   Time 4   Period Weeks   Status Achieved   PT SHORT TERM GOAL #2   Title tolerates wear of bilateral prostheses >90% of awake hours with no skin issues and discomfort <2/10 (Target Date: 03/16/15)   Baseline 03/19/15- not met for left leg, met for right leg   Time 4   Period Weeks   Status Not Met   PT SHORT TERM GOAL #3   Title ambulates 200' with [facility] & prostheses modified independent. (Target Date: 03/16/15)   Time 4   Period Weeks   Status Achieved   PT SHORT TERM GOAL #4   Title negotiates ramp & curb with [facility] & prostheses with supervision. (Target Date: 03/16/15)   Baseline 03/19/15-still needed assist for balance at times   Time 4   Period Weeks   Status Not Met   PT SHORT TERM GOAL #5   Title manages clothes including pants for toileting and jacket in standing safely modified independent. (Target Date: 03/16/15)   Time 4   Period Weeks   Status Achieved           PT Long Term Goals - 02/15/15 0800    PT LONG TERM GOAL #1   Title independent with prosthetic care. (Target Date: 04/13/15)   Time 8   Period Weeks   Status New   PT LONG TERM GOAL #2   Title wears bilateral prostheses >90%  of awake hours with no skin issues or pain.  (Target Date: 04/13/15)   Time 8   Period Weeks   Status New   PT LONG TERM GOAL #3   Title ambulates 100' with bilateral prostheses carrying plate & cup modified independent.  (Target Date: 04/13/15)   Time 8   Period Weeks   Status New   PT LONG TERM GOAL #4   Title ambulates 300' with LRAD & bilateral prostheses modified independent.  (Target Date: 04/13/15)   Time 8  Period Weeks   Status New   PT LONG TERM GOAL #5   Title negotiates ramp, curb & stairs with LRAD & bilateral prostheses modified independent.  (Target Date: 04/13/15)   Time 8   Period Weeks   Status New   Additional Long Term Goals   Additional Long Term Goals Yes   PT LONG TERM GOAL #6   Title Timed Up & Go with prostheses only <13.5 seconds  (Target Date: 04/13/15)   Time 8   Period Weeks   Status New   PT LONG TERM GOAL #7   Title Berg Balance >30/56  (Target Date: 04/13/15)   Time 8   Period Weeks   Status New               Plan - 03/20/15 1315    Clinical Impression Statement Patient needs to wear right prosthesis during day when left prosthesis is off to allow healing. This will increase safety and transfers. Patient improved ability to turn with instruction and repetition.   Pt will benefit from skilled therapeutic intervention in order to improve on the following deficits Abnormal gait;Decreased activity tolerance;Decreased balance;Decreased endurance;Decreased knowledge of use of DME;Decreased mobility;Decreased range of motion;Decreased strength;Other (comment)  prosthetic dependency   Rehab Potential Good   PT Frequency 2x / week   PT Duration 8 weeks   PT Treatment/Interventions ADLs/Self Care Home Management;DME Instruction;Gait training;Stair training;Functional mobility training;Therapeutic activities;Therapeutic exercise;Balance training;Neuromuscular re-education;Patient/family education;Other (comment)  prosthetic training   PT Next Visit  Plan Do G code. review prosthetic care, gait with single point cane including barriers   PT Home Exercise Plan balance HEP   Consulted and Agree with Plan of Care Patient        Problem List Patient Active Problem List   Diagnosis Date Noted  . Gastroparesis 11/02/2014  . Diabetes mellitus due to underlying condition with other specified complication   . Diabetic foot   . Nausea & vomiting   . Essential hypertension 10/25/2014  . GERD (gastroesophageal reflux disease) 10/25/2014  . Cellulitis 10/24/2014  . Diabetes 10/24/2014  . Charcot foot due to diabetes mellitus 10/24/2014    Jamey Reas PT, DPT 03/20/2015, 9:01 PM  [facility] [address] [place], [place], [address] Phone: [phone]   Fax:  [phone]

## 2015-03-26 ENCOUNTER — Ambulatory Visit: Payer: Commercial Managed Care - HMO | Attending: Orthopedic Surgery | Admitting: Physical Therapy

## 2015-03-26 ENCOUNTER — Encounter: Payer: Self-pay | Admitting: Physical Therapy

## 2015-03-26 DIAGNOSIS — Z89611 Acquired absence of right leg above knee: Secondary | ICD-10-CM | POA: Insufficient documentation

## 2015-03-26 DIAGNOSIS — Z89512 Acquired absence of left leg below knee: Secondary | ICD-10-CM

## 2015-03-26 DIAGNOSIS — Z7409 Other reduced mobility: Secondary | ICD-10-CM

## 2015-03-26 DIAGNOSIS — R269 Unspecified abnormalities of gait and mobility: Secondary | ICD-10-CM

## 2015-03-26 DIAGNOSIS — Z89612 Acquired absence of left leg above knee: Secondary | ICD-10-CM | POA: Diagnosis not present

## 2015-03-26 DIAGNOSIS — Z4781 Encounter for orthopedic aftercare following surgical amputation: Secondary | ICD-10-CM | POA: Diagnosis not present

## 2015-03-26 DIAGNOSIS — Z89511 Acquired absence of right leg below knee: Secondary | ICD-10-CM

## 2015-03-26 DIAGNOSIS — R2689 Other abnormalities of gait and mobility: Secondary | ICD-10-CM

## 2015-03-26 NOTE — Therapy (Signed)
[facility] [address] [place], [place], [address] Phone: [phone]   Fax:  [phone]  Physical Therapy Treatment  Patient Details  Name: Diana Coleman MRN: [id number] Date of Birth: 10-29-1951 Referring Provider:  Lucianne Lei, MD  Encounter Date: 03/26/2015      PT End of Session - 03/26/15 0845    Visit Number 10   Number of Visits 17   Date for PT Re-Evaluation 04/13/15   PT Start Time 0845   PT Stop Time 0929   PT Time Calculation (min) 44 min   Equipment Utilized During Treatment Gait belt   Activity Tolerance Patient tolerated treatment well   Behavior During Therapy [facility] for tasks assessed/performed      Past Medical History  Diagnosis Date  . Type II diabetes mellitus   . Charcot foot due to diabetes mellitus   . Cellulitis of foot, left 10/24/2014    hx/notes 10/24/2014  . Osteomyelitis of right foot     hx/notes 10/24/2014    Past Surgical History  Procedure Laterality Date  . Above knee leg amputation Right 2004  . Tubal ligation  1973  . Reduction mammaplasty Bilateral 1987  . Foot surgery Left 1980's    "ulcer removed"  . Toe amputation Left ~ 2011    "top of my 3rd toe"  . Amputation Left 10/29/2014    Procedure: AMPUTATION BELOW KNEE - LEFT;  Surgeon: Newt Minion, MD;  Location: [facility];  Service: Orthopedics;  Laterality: Left;    There were no vitals filed for this visit.  Visit Diagnosis:  Abnormality of gait  Balance problems  Impaired functional mobility and activity tolerance  Status post bilateral below knee amputation      Subjective Assessment - 03/26/15 0851    Subjective wearing right prosthesis all awake hours, left ~2 hours 2x/day. Keeping right prosthesis on limb as PT advised is helpful.   Currently in Pain? No/denies            [facility] PT Assessment - 03/26/15 0845    Berg Balance Test   Sit to Stand Able to stand  independently using hands   Standing  Unsupported Able to stand safely 2 minutes   Sitting with Back Unsupported but Feet Supported on Floor or Stool Able to sit safely and securely 2 minutes   Stand to Sit Controls descent by using hands   Transfers Able to transfer safely, definite need of hands   Standing Unsupported with Eyes Closed Able to stand 10 seconds with supervision   Standing Ubsupported with Feet Together Able to place feet together independently but unable to hold for 30 seconds   From Standing, Reach Forward with Outstretched Arm Can reach forward >12 cm safely (5")   From Standing Position, Pick up Object from Floor Able to pick up shoe, needs supervision   From Standing Position, Turn to Look Behind Over each Shoulder Turn sideways only but maintains balance   Turn 360 Degrees Able to turn 360 degrees safely but slowly   Standing Unsupported, Alternately Place Feet on Step/Stool Able to complete >2 steps/needs minimal assist   Standing Unsupported, One Foot in Front Able to take small step independently and hold 30 seconds   Standing on One Leg Tries to lift leg/unable to hold 3 seconds but remains standing independently   Total Score 36   Timed Up and Go Test   Normal TUG (seconds) 18.79  [facility] Adult PT Treatment/Exercise - 03/26/15 0845    Transfers   Sit to Stand 5: Supervision;With upper extremity assist;From chair/3-in-1  chair without armrests   Stand to Sit 5: Supervision;With upper extremity assist;To chair/3-in-1  without armrests   Ambulation/Gait   Ambulation/Gait Yes   Ambulation/Gait Assistance 5: Supervision;4: Min assist  SBA cane, MinA no device   Ambulation/Gait Assistance Details verbal & demo cues on turning around obstacles, head turns maintaining path/pace and step length / push-off   Ambulation Distance (Feet) 600 Feet  x1 cane; 120' x2 no device   Assistive device Prostheses;Straight cane   Gait Pattern --   Ambulation Surface Level;Indoor   Stairs Yes    Stairs Assistance 5: Supervision   Stairs Assistance Details (indicate cue type and reason) verbal cues on foot position   Stair Management Technique Two rails;Alternating pattern;Forwards;One rail Left;One rail Right;Sideways   Number of Stairs 4  5 reps   Ramp 4: Min assist;5: Supervision   Ramp Details (indicate cue type and reason) cues on technique   Curb 4: Min assist  4" & 6" steps   Curb Details (indicate cue type and reason) demo foot position   Static Standing Balance   Static Standing - Balance Support No upper extremity supported;During functional activity   Static Standing - Level of Assistance 5: Stand by assistance   High Level Balance   High Level Balance Activities Backward walking;Side stepping;Marching forwards;Tandem walking;Negotitating around obstacles;Negotiating over obstacles  cane, prostheses, light touch on wall   High Level Balance Comments --   Prosthetics   Prosthetic Care Comments  Tegaderm intact. Wound appears to be healing. Still with some bloody drainage. Pt to continue with 2 hours on, 2 hours off wear schedule.               Current prosthetic wear tolerance (days/week)  7 days   Current prosthetic wear tolerance (#hours/day)  right prosthesis all awake hrs, left prosthesis 2 hours 2-3 times per day.   Residual limb condition  wound covered with Tegaderm, healing, no signs of infection   Education Provided Skin check;Residual limb care;Proper wear schedule/adjustment   Person(s) Educated Patient   Education Method Explanation   Education Method Verbalized understanding                PT Education - 03/26/15 1230    Education provided Yes   Education Details see prosthetic care   Person(s) Educated Patient   Methods Explanation   Comprehension Verbalized understanding          PT Short Term Goals - 03/19/15 1302    PT SHORT TERM GOAL #1   Title demonstrates proper donning of prostheses (Target Date: 03/16/15)   Time 4   Period  Weeks   Status Achieved   PT SHORT TERM GOAL #2   Title tolerates wear of bilateral prostheses >90% of awake hours with no skin issues and discomfort <2/10 (Target Date: 03/16/15)   Baseline 03/19/15- not met for left leg, met for right leg   Time 4   Period Weeks   Status Not Met   PT SHORT TERM GOAL #3   Title ambulates 200' with [facility] & prostheses modified independent. (Target Date: 03/16/15)   Time 4   Period Weeks   Status Achieved   PT SHORT TERM GOAL #4   Title negotiates ramp & curb with [facility] & prostheses with supervision. (Target Date: 03/16/15)   Baseline 03/19/15-still needed assist for balance at times  Time 4   Period Weeks   Status Not Met   PT SHORT TERM GOAL #5   Title manages clothes including pants for toileting and jacket in standing safely modified independent. (Target Date: 03/16/15)   Time 4   Period Weeks   Status Achieved           PT Long Term Goals - 02/15/15 0800    PT LONG TERM GOAL #1   Title independent with prosthetic care. (Target Date: 04/13/15)   Time 8   Period Weeks   Status New   PT LONG TERM GOAL #2   Title wears bilateral prostheses >90% of awake hours with no skin issues or pain.  (Target Date: 04/13/15)   Time 8   Period Weeks   Status New   PT LONG TERM GOAL #3   Title ambulates 100' with bilateral prostheses carrying plate & cup modified independent.  (Target Date: 04/13/15)   Time 8   Period Weeks   Status New   PT LONG TERM GOAL #4   Title ambulates 300' with LRAD & bilateral prostheses modified independent.  (Target Date: 04/13/15)   Time 8   Period Weeks   Status New   PT LONG TERM GOAL #5   Title negotiates ramp, curb & stairs with LRAD & bilateral prostheses modified independent.  (Target Date: 04/13/15)   Time 8   Period Weeks   Status New   Additional Long Term Goals   Additional Long Term Goals Yes   PT LONG TERM GOAL #6   Title Timed Up & Go with prostheses only <13.5 seconds  (Target Date: 04/13/15)   Time 8    Period Weeks   Status New   PT LONG TERM GOAL #7   Title Berg Balance >30/56  (Target Date: 04/13/15)   Time 8   Period Weeks   Status New               Plan - Apr 15, 2015 0845    Clinical Impression Statement Patient's wound is healing. Berg Balance and Timed Up & Go indicate improved balance and mobility.   Pt will benefit from skilled therapeutic intervention in order to improve on the following deficits Abnormal gait;Decreased activity tolerance;Decreased balance;Decreased endurance;Decreased knowledge of use of DME;Decreased mobility;Decreased range of motion;Decreased strength;Other (comment)  prosthetic dependency   Rehab Potential Good   PT Frequency 2x / week   PT Duration 8 weeks   PT Treatment/Interventions ADLs/Self Care Home Management;DME Instruction;Gait training;Stair training;Functional mobility training;Therapeutic activities;Therapeutic exercise;Balance training;Neuromuscular re-education;Patient/family education;Other (comment)  prosthetic training   PT Next Visit Plan review prosthetic care, gait with single point cane including barriers   PT Home Exercise Plan balance HEP   Consulted and Agree with Plan of Care Patient          G-Codes - 04/15/15 2247-03-15    Functional Assessment Tool Used Merrilee Jansky Balance 36/56; Timed Up & Go 18.79sec   Functional Limitation Mobility: Walking and moving around   Mobility: Walking and Moving Around Current Status ([id number]) At least 40 percent but less than 60 percent impaired, limited or restricted   Mobility: Walking and Moving Around Goal Status [phone]) At least 1 percent but less than 20 percent impaired, limited or restricted      Problem List Patient Active Problem List   Diagnosis Date Noted  . Gastroparesis 11/02/2014  . Diabetes mellitus due to underlying condition with other specified complication   . Diabetic foot   . Nausea & vomiting   .  Essential hypertension 10/25/2014  . GERD (gastroesophageal reflux disease)  10/25/2014  . Cellulitis 10/24/2014  . Diabetes 10/24/2014  . Charcot foot due to diabetes mellitus 10/24/2014    Jamey Reas PT, DPT 03/26/2015, 10:51 PM  [facility] [address] [place], [place], [address] Phone: [phone]   Fax:  [phone]

## 2015-03-27 ENCOUNTER — Encounter: Payer: Self-pay | Admitting: Physical Therapy

## 2015-03-27 ENCOUNTER — Ambulatory Visit: Payer: Commercial Managed Care - HMO | Admitting: Physical Therapy

## 2015-03-27 DIAGNOSIS — R2689 Other abnormalities of gait and mobility: Secondary | ICD-10-CM

## 2015-03-27 DIAGNOSIS — Z4781 Encounter for orthopedic aftercare following surgical amputation: Secondary | ICD-10-CM | POA: Diagnosis not present

## 2015-03-27 DIAGNOSIS — R269 Unspecified abnormalities of gait and mobility: Secondary | ICD-10-CM | POA: Diagnosis not present

## 2015-03-27 DIAGNOSIS — Z89612 Acquired absence of left leg above knee: Secondary | ICD-10-CM | POA: Diagnosis not present

## 2015-03-27 DIAGNOSIS — Z7409 Other reduced mobility: Secondary | ICD-10-CM

## 2015-03-27 DIAGNOSIS — Z89611 Acquired absence of right leg above knee: Secondary | ICD-10-CM | POA: Diagnosis not present

## 2015-03-27 NOTE — Therapy (Signed)
[facility] [address] [place], [place], [address] Phone: [phone]   Fax:  [phone]  Physical Therapy Treatment  Patient Details  Name: Diana Coleman MRN: [id number] Date of Birth: 1951/10/14 Referring Provider:  Lucianne Lei, MD  Encounter Date: 03/27/2015      PT End of Session - 03/27/15 0809    Visit Number 11   Number of Visits 17   Date for PT Re-Evaluation 04/13/15   PT Start Time 0805   PT Stop Time 0845   PT Time Calculation (min) 40 min   Equipment Utilized During Treatment Gait belt   Activity Tolerance Patient tolerated treatment well   Behavior During Therapy [facility] for tasks assessed/performed      Past Medical History  Diagnosis Date  . Type II diabetes mellitus   . Charcot foot due to diabetes mellitus   . Cellulitis of foot, left 10/24/2014    hx/notes 10/24/2014  . Osteomyelitis of right foot     hx/notes 10/24/2014    Past Surgical History  Procedure Laterality Date  . Above knee leg amputation Right 2004  . Tubal ligation  1973  . Reduction mammaplasty Bilateral 1987  . Foot surgery Left 1980's    "ulcer removed"  . Toe amputation Left ~ 2011    "top of my 3rd toe"  . Amputation Left 10/29/2014    Procedure: AMPUTATION BELOW KNEE - LEFT;  Surgeon: Newt Minion, MD;  Location: [place];  Service: Orthopedics;  Laterality: Left;    There were no vitals filed for this visit.  Visit Diagnosis:  Abnormality of gait  Impaired functional mobility and activity tolerance  Balance problems      Subjective Assessment - 03/27/15 0809    Subjective No new complaints. No pain or falls to report.   Currently in Pain? No/denies   Pain Score 0-No pain           [facility] Adult PT Treatment/Exercise - 03/27/15 0810    Transfers   Sit to Stand 5: Supervision;With upper extremity assist;From chair/3-in-1   Stand to Sit 5: Supervision;With upper extremity assist;To chair/3-in-1   Ambulation/Gait   Ambulation/Gait Yes   Ambulation/Gait Assistance 5: Supervision   Ambulation/Gait Assistance Details cues on posture, step length, base of support and decreased arm swing with gait without device   Ambulation Distance (Feet) 660 Feet  x1 cane; 220 and 120 no device   Assistive device Prostheses;Straight cane   Gait Pattern Step-through pattern;Decreased stride length;Decreased step length - right;Decreased step length - left;Wide base of support;Decreased trunk rotation  increased arm swing without device   Ambulation Surface Level;Indoor   Stairs Yes   Stairs Assistance 5: Supervision   Stairs Assistance Details (indicate cue type and reason) cues on foot position with descending stairs   Stair Management Technique Two rails;Alternating pattern;Forwards   Number of Stairs 4   x2 reps   Ramp 4: Min assist  x3 reps with cane   Ramp Details (indicate cue type and reason) cues on step length, cane use and posture. Pt tends to have increased pace with going down ramp   Curb 4: Min assist  x 3 reps with cane   Curb Details (indicate cue type and reason) cues on foot position, sequence and posture. Min assist to recover balance on 2cd rep with ascending curb.    High Level Balance   High Level Balance Activities Side stepping;Backward walking;Marching forwards;Tandem walking;Negotiating over obstacles   High Level  Balance Comments x 3 laps each in parallel bars with intermittent UE assist and min guard assist. step overs: x 4 bolsters of vaired heights with cane, cues on sequenced and up to min assist for balance.              Prosthetics   Prosthetic Care Comments  No changes in wound since yesterday. Covered with Tegaderm.   Current prosthetic wear tolerance (days/week)  7 days   Current prosthetic wear tolerance (#hours/day)  right prosthesis all awake hrs, left prosthesis 2 hours 2-3 times per day.   Residual limb condition  wound covered with Tegaderm, healing, no signs  of infection   Education Provided Residual limb care;Skin check;Correct ply sock adjustment   Person(s) Educated Patient   Education Method Explanation   Education Method Verbalized understanding   Donning Prosthesis Modified independent (device/increased time)   Doffing Prosthesis Modified independent (device/increased time)            PT Short Term Goals - 03/19/15 1302    PT SHORT TERM GOAL #1   Title demonstrates proper donning of prostheses (Target Date: 03/16/15)   Time 4   Period Weeks   Status Achieved   PT SHORT TERM GOAL #2   Title tolerates wear of bilateral prostheses >90% of awake hours with no skin issues and discomfort <2/10 (Target Date: 03/16/15)   Baseline 03/19/15- not met for left leg, met for right leg   Time 4   Period Weeks   Status Not Met   PT SHORT TERM GOAL #3   Title ambulates 200' with [facility] & prostheses modified independent. (Target Date: 03/16/15)   Time 4   Period Weeks   Status Achieved   PT SHORT TERM GOAL #4   Title negotiates ramp & curb with [facility] & prostheses with supervision. (Target Date: 03/16/15)   Baseline 03/19/15-still needed assist for balance at times   Time 4   Period Weeks   Status Not Met   PT SHORT TERM GOAL #5   Title manages clothes including pants for toileting and jacket in standing safely modified independent. (Target Date: 03/16/15)   Time 4   Period Weeks   Status Achieved           PT Long Term Goals - 02/15/15 0800    PT LONG TERM GOAL #1   Title independent with prosthetic care. (Target Date: 04/13/15)   Time 8   Period Weeks   Status New   PT LONG TERM GOAL #2   Title wears bilateral prostheses >90% of awake hours with no skin issues or pain.  (Target Date: 04/13/15)   Time 8   Period Weeks   Status New   PT LONG TERM GOAL #3   Title ambulates 100' with bilateral prostheses carrying plate & cup modified independent.  (Target Date: 04/13/15)   Time 8   Period Weeks   Status New   PT LONG TERM GOAL #4    Title ambulates 300' with LRAD & bilateral prostheses modified independent.  (Target Date: 04/13/15)   Time 8   Period Weeks   Status New   PT LONG TERM GOAL #5   Title negotiates ramp, curb & stairs with LRAD & bilateral prostheses modified independent.  (Target Date: 04/13/15)   Time 8   Period Weeks   Status New   Additional Long Term Goals   Additional Long Term Goals Yes   PT LONG TERM GOAL #6   Title Timed Up &  Go with prostheses only <13.5 seconds  (Target Date: 04/13/15)   Time 8   Period Weeks   Status New   PT LONG TERM GOAL #7   Title Berg Balance >30/56  (Target Date: 04/13/15)   Time 8   Period Weeks   Status New           Plan - 03/27/15 1052    Clinical Impression Statement Pt making steady progress toward goals. Still unsteady on ramp/cub, with increased speed on descent causing her unsteadiness on ramp.   PT Next Visit Plan review prosthetic care, gait with single point cane including barriers.        Problem List Patient Active Problem List   Diagnosis Date Noted  . Gastroparesis 11/02/2014  . Diabetes mellitus due to underlying condition with other specified complication   . Diabetic foot   . Nausea & vomiting   . Essential hypertension 10/25/2014  . GERD (gastroesophageal reflux disease) 10/25/2014  . Cellulitis 10/24/2014  . Diabetes 10/24/2014  . Charcot foot due to diabetes mellitus 10/24/2014    Willow Ora 03/27/2015, 10:53 AM  Willow Ora, PTA, [facility] [address] [place], [place] [address] [phone] 03/27/2015, 10:54 AM

## 2015-04-02 ENCOUNTER — Encounter: Payer: Self-pay | Admitting: Physical Therapy

## 2015-04-02 ENCOUNTER — Ambulatory Visit: Payer: Commercial Managed Care - HMO | Admitting: Physical Therapy

## 2015-04-02 DIAGNOSIS — R2689 Other abnormalities of gait and mobility: Secondary | ICD-10-CM

## 2015-04-02 DIAGNOSIS — R269 Unspecified abnormalities of gait and mobility: Secondary | ICD-10-CM

## 2015-04-02 DIAGNOSIS — Z89611 Acquired absence of right leg above knee: Secondary | ICD-10-CM | POA: Diagnosis not present

## 2015-04-02 DIAGNOSIS — Z89612 Acquired absence of left leg above knee: Secondary | ICD-10-CM | POA: Diagnosis not present

## 2015-04-02 DIAGNOSIS — Z4781 Encounter for orthopedic aftercare following surgical amputation: Secondary | ICD-10-CM | POA: Diagnosis not present

## 2015-04-02 DIAGNOSIS — Z7409 Other reduced mobility: Secondary | ICD-10-CM

## 2015-04-02 NOTE — Therapy (Addendum)
[facility] [address] [place], [place], [address] Phone: [phone]   Fax:  [phone]  Physical Therapy Treatment  Patient Details  Name: Diana Coleman MRN: [id number] Date of Birth: 1951-06-23 Referring Provider:  Lucianne Lei, MD  Encounter Date: 04/02/2015      PT End of Session - 04/02/15 0808    Visit Number 12   Number of Visits 17   Date for PT Re-Evaluation 04/13/15   PT Start Time 0803   PT Stop Time 0846   PT Time Calculation (min) 43 min   Equipment Utilized During Treatment Gait belt   Activity Tolerance Patient tolerated treatment well   Behavior During Therapy [facility] for tasks assessed/performed      Past Medical History  Diagnosis Date  . Type II diabetes mellitus   . Charcot foot due to diabetes mellitus   . Cellulitis of foot, left 10/24/2014    hx/notes 10/24/2014  . Osteomyelitis of right foot     hx/notes 10/24/2014    Past Surgical History  Procedure Laterality Date  . Above knee leg amputation Right 2004  . Tubal ligation  1973  . Reduction mammaplasty Bilateral 1987  . Foot surgery Left 1980's    "ulcer removed"  . Toe amputation Left ~ 2011    "top of my 3rd toe"  . Amputation Left 10/29/2014    Procedure: AMPUTATION BELOW KNEE - LEFT;  Surgeon: Newt Minion, MD;  Location: [place];  Service: Orthopedics;  Laterality: Left;    There were no vitals filed for this visit.  Visit Diagnosis:  Abnormality of gait  Impaired functional mobility and activity tolerance  Balance problems      Subjective Assessment - 04/02/15 0807    Subjective No new compliants. No pain. One fall from rolling chair to floor with no injury (was reaching to ground to pick up something and office chair rolled out from under her). Was able to get herself up.   Currently in Pain? No/denies   Pain Score 0-No pain           [facility] Adult PT Treatment/Exercise - 04/02/15 0809    Transfers   Sit to  Stand 5: Supervision;With upper extremity assist;From chair/3-in-1   Stand to Sit 5: Supervision;With upper extremity assist;To chair/3-in-1   Ambulation/Gait   Ambulation/Gait Yes   Ambulation/Gait Assistance 5: Supervision   Ambulation/Gait Assistance Details cues on posture, step length and cane placement   Ambulation Distance (Feet) 450 Feet  220 feet x 2 reps   Assistive device Prostheses;Straight cane   Gait Pattern Step-through pattern;Decreased stride length;Decreased step length - right;Decreased step length - left;Wide base of support;Decreased trunk rotation   Ambulation Surface Level;Indoor   Stairs Yes   Stairs Assistance 5: Supervision   Stairs Assistance Details (indicate cue type and reason) cues on hand placement and correct foot position. cues on slower/controlled descent with going down stairs.                            Stair Management Technique Two rails;Alternating pattern;Forwards   Number of Stairs 4  x 3 reps   Ramp 4: Min assist  with prostheses/cane   Ramp Details (indicate cue type and reason) cues on step length, posture and cane placement. Pt tends to increase cadence with descending causing her to be more unstable  Curb 4: Min assist  with prostheses/cane   Curb Details (indicate cue type and reason) cues on foot position with descending curb and on staggered foot position with ascending curb                            High Level Balance   High Level Balance Activities Negotiating over obstacles   High Level Balance Comments x 4 laps stepping over 4 bolsters of varied heights with cane/prostheses. cues on posture and sequence with stepping forward.   Knee/Hip Exercises: Aerobic   Stationary Bike Scifit x 4 extremities level 3.0 x 8 minutes with goal rpm >/=60 for strengthening and activity tolerance.                        Prosthetics   Current prosthetic wear tolerance (days/week)  7 days   Current prosthetic wear tolerance  (#hours/day)  right prosthesis all awake hrs, left prosthesis 2 hours 2-3 times per day.   Residual limb condition  wound healed completly, no open areas. Pt no longer to use Tegaderm, however will continue with modified wear schedule x 1 more week to ensure skin integrity before going back to all awake hours   Education Provided Residual limb care;Proper wear schedule/adjustment;Proper weight-bearing schedule/adjustment   Person(s) Educated Patient   Education Method Explanation;Demonstration   Education Method Verbalized understanding   Donning Prosthesis Modified independent (device/increased time)   Doffing Prosthesis Modified independent (device/increased time)           PT Short Term Goals - 03/19/15 1302    PT SHORT TERM GOAL #1   Title demonstrates proper donning of prostheses (Target Date: 03/16/15)   Time 4   Period Weeks   Status Achieved   PT SHORT TERM GOAL #2   Title tolerates wear of bilateral prostheses >90% of awake hours with no skin issues and discomfort <2/10 (Target Date: 03/16/15)   Baseline 03/19/15- not met for left leg, met for right leg   Time 4   Period Weeks   Status Not Met   PT SHORT TERM GOAL #3   Title ambulates 200' with [facility] & prostheses modified independent. (Target Date: 03/16/15)   Time 4   Period Weeks   Status Achieved   PT SHORT TERM GOAL #4   Title negotiates ramp & curb with [facility] & prostheses with supervision. (Target Date: 03/16/15)   Baseline 03/19/15-still needed assist for balance at times   Time 4   Period Weeks   Status Not Met   PT SHORT TERM GOAL #5   Title manages clothes including pants for toileting and jacket in standing safely modified independent. (Target Date: 03/16/15)   Time 4   Period Weeks   Status Achieved           PT Long Term Goals - 02/15/15 0800    PT LONG TERM GOAL #1   Title independent with prosthetic care. (Target Date: 04/13/15)   Time 8   Period Weeks   Status New   PT LONG TERM GOAL #2   Title  wears bilateral prostheses >90% of awake hours with no skin issues or pain.  (Target Date: 04/13/15)   Time 8   Period Weeks   Status New   PT LONG TERM GOAL #3   Title ambulates 100' with bilateral prostheses carrying plate & cup modified independent.  (Target Date: 04/13/15)   Time 8  Period Weeks   Status New   PT LONG TERM GOAL #4   Title ambulates 300' with LRAD & bilateral prostheses modified independent.  (Target Date: 04/13/15)   Time 8   Period Weeks   Status New   PT LONG TERM GOAL #5   Title negotiates ramp, curb & stairs with LRAD & bilateral prostheses modified independent.  (Target Date: 04/13/15)   Time 8   Period Weeks   Status New   Additional Long Term Goals   Additional Long Term Goals Yes   PT LONG TERM GOAL #6   Title Timed Up & Go with prostheses only <13.5 seconds  (Target Date: 04/13/15)   Time 8   Period Weeks   Status New   PT LONG TERM GOAL #7   Title Berg Balance >30/56  (Target Date: 04/13/15)   Time 8   Period Weeks   Status New           Plan - 04/02/15 0809    Clinical Impression Statement Pt making steady progress toward her goals. Still need's cues for safety/sequence with ramp/cub/stairs. Continues to be challenged by high level balance activites.   Pt will benefit from skilled therapeutic intervention in order to improve on the following deficits Abnormal gait;Decreased activity tolerance;Decreased balance;Decreased endurance;Decreased knowledge of use of DME;Decreased mobility;Decreased range of motion;Decreased strength;Other (comment)  prosthetic dependency   Rehab Potential Good   PT Frequency 2x / week   PT Duration 8 weeks   PT Treatment/Interventions ADLs/Self Care Home Management;DME Instruction;Gait training;Stair training;Functional mobility training;Therapeutic activities;Therapeutic exercise;Balance training;Neuromuscular re-education;Patient/family education;Other (comment)  prosthetic training   PT Next Visit Plan review  prosthetic care, gait with single point cane including barriers;dynamic balance/gait activities.   PT Home Exercise Plan balance HEP   Consulted and Agree with Plan of Care Patient     Problem List Patient Active Problem List   Diagnosis Date Noted  . Gastroparesis 11/02/2014  . Diabetes mellitus due to underlying condition with other specified complication   . Diabetic foot   . Nausea & vomiting   . Essential hypertension 10/25/2014  . GERD (gastroesophageal reflux disease) 10/25/2014  . Cellulitis 10/24/2014  . Diabetes 10/24/2014  . Charcot foot due to diabetes mellitus 10/24/2014    Willow Ora 04/02/2015, 9:09 AM  Willow Ora, PTA, [place] [facility] [address] [place], [place] [address] [phone] 04/02/2015, 9:09 AM

## 2015-04-03 ENCOUNTER — Ambulatory Visit: Payer: Commercial Managed Care - HMO | Admitting: Physical Therapy

## 2015-04-03 ENCOUNTER — Encounter: Payer: Self-pay | Admitting: Physical Therapy

## 2015-04-03 DIAGNOSIS — Z4781 Encounter for orthopedic aftercare following surgical amputation: Secondary | ICD-10-CM | POA: Diagnosis not present

## 2015-04-03 DIAGNOSIS — R269 Unspecified abnormalities of gait and mobility: Secondary | ICD-10-CM

## 2015-04-03 DIAGNOSIS — Z89612 Acquired absence of left leg above knee: Secondary | ICD-10-CM | POA: Diagnosis not present

## 2015-04-03 DIAGNOSIS — Z7409 Other reduced mobility: Secondary | ICD-10-CM

## 2015-04-03 DIAGNOSIS — R2689 Other abnormalities of gait and mobility: Secondary | ICD-10-CM

## 2015-04-03 DIAGNOSIS — Z89611 Acquired absence of right leg above knee: Secondary | ICD-10-CM | POA: Diagnosis not present

## 2015-04-03 NOTE — Therapy (Signed)
[facility] [address] [place], [place], [address] Phone: [phone]   Fax:  [phone]  Physical Therapy Treatment  Patient Details  Name: Diana Coleman MRN: [id number] Date of Birth: Aug 10, 1951 Referring Provider:  Lucianne Lei, MD  Encounter Date: 04/03/2015      PT End of Session - 04/03/15 0854    Visit Number 13   Number of Visits 17   Date for PT Re-Evaluation 04/13/15   PT Start Time 0850   PT Stop Time 0930   PT Time Calculation (min) 40 min   Equipment Utilized During Treatment Gait belt   Activity Tolerance Patient tolerated treatment well   Behavior During Therapy [facility] for tasks assessed/performed      Past Medical History  Diagnosis Date  . Type II diabetes mellitus   . Charcot foot due to diabetes mellitus   . Cellulitis of foot, left 10/24/2014    hx/notes 10/24/2014  . Osteomyelitis of right foot     hx/notes 10/24/2014    Past Surgical History  Procedure Laterality Date  . Above knee leg amputation Right 2004  . Tubal ligation  1973  . Reduction mammaplasty Bilateral 1987  . Foot surgery Left 1980's    "ulcer removed"  . Toe amputation Left ~ 2011    "top of my 3rd toe"  . Amputation Left 10/29/2014    Procedure: AMPUTATION BELOW KNEE - LEFT;  Surgeon: Newt Minion, MD;  Location: [place];  Service: Orthopedics;  Laterality: Left;    There were no vitals filed for this visit.  Visit Diagnosis:  Abnormality of gait  Impaired functional mobility and activity tolerance  Balance problems      Subjective Assessment - 04/03/15 1055    Subjective No new compalints. No pain or new falls to report.   Currently in Pain? No/denies           [facility] Adult PT Treatment/Exercise - 04/03/15 0855    Transfers   Sit to Stand 5: Supervision;With upper extremity assist;From chair/3-in-1   Stand to Sit 5: Supervision;With upper extremity assist;To chair/3-in-1   Ambulation/Gait   Ambulation/Gait Yes   Ambulation/Gait Assistance 5: Supervision   Ambulation/Gait Assistance Details cues to decrease base of support as able, for posture and for equal step length   Ambulation Distance (Feet) 480 Feet   Assistive device Prostheses;Straight cane   Gait Pattern Step-through pattern;Decreased stride length;Decreased step length - right;Decreased step length - left;Wide base of support;Decreased trunk rotation   Ambulation Surface Level;Indoor   Ramp 4: Min assist  cane/prostheses x 2 reps   Ramp Details (indicate cue type and reason) min cues on step length and posture   Curb 4: Min assist  cane/prostheses   Curb Details (indicate cue type and reason) cues on foot position each way   Dynamic Standing Balance   Dynamic Standing - Balance Support No upper extremity supported;During functional activity;Left upper extremity supported  intermittent UE support with airex, 1 UE support with beam   Dynamic Standing - Level of Assistance 4: Min assist   Dynamic Standing - Balance Activities Foam balance beam;Compliant surfaces;Alternating  foot traps;Eyes open;Eyes closed;Head turns;Head nods  airex   Dynamic Standing - Comments standing on air ex feet apart: eyes closed no head movements, eyes open, then eyes closed with head nods/shakes; standing with feet across red foam beam: alternating forward taps and then backwards taps.  Knee/Hip Exercises: Aerobic   Stationary Bike Scifit x 4 extremities level 3.5 x 8 minutes with goal rpm >/=60 for strengthening and activity tolerance.                        Prosthetics   Current prosthetic wear tolerance (days/week)  7 days   Current prosthetic wear tolerance (#hours/day)  right prosthesis all awake hrs, left prosthesis 2 hours 2-3 times per day.   Residual limb condition  wound healed completly, no open areas. Pt no longer to use Tegaderm, however will continue with modified wear schedule x 1 more week to  ensure skin integrity before going back to all awake hours   Education Provided Residual limb care   Person(s) Educated Patient   Education Method Explanation   Education Method Verbalized understanding   Donning Prosthesis Modified independent (device/increased time)   Doffing Prosthesis Modified independent (device/increased time)           PT Short Term Goals - 03/19/15 1302    PT SHORT TERM GOAL #1   Title demonstrates proper donning of prostheses (Target Date: 03/16/15)   Time 4   Period Weeks   Status Achieved   PT SHORT TERM GOAL #2   Title tolerates wear of bilateral prostheses >90% of awake hours with no skin issues and discomfort <2/10 (Target Date: 03/16/15)   Baseline 03/19/15- not met for left leg, met for right leg   Time 4   Period Weeks   Status Not Met   PT SHORT TERM GOAL #3   Title ambulates 200' with [facility] & prostheses modified independent. (Target Date: 03/16/15)   Time 4   Period Weeks   Status Achieved   PT SHORT TERM GOAL #4   Title negotiates ramp & curb with [facility] & prostheses with supervision. (Target Date: 03/16/15)   Baseline 03/19/15-still needed assist for balance at times   Time 4   Period Weeks   Status Not Met   PT SHORT TERM GOAL #5   Title manages clothes including pants for toileting and jacket in standing safely modified independent. (Target Date: 03/16/15)   Time 4   Period Weeks   Status Achieved           PT Long Term Goals - 02/15/15 0800    PT LONG TERM GOAL #1   Title independent with prosthetic care. (Target Date: 04/13/15)   Time 8   Period Weeks   Status New   PT LONG TERM GOAL #2   Title wears bilateral prostheses >90% of awake hours with no skin issues or pain.  (Target Date: 04/13/15)   Time 8   Period Weeks   Status New   PT LONG TERM GOAL #3   Title ambulates 100' with bilateral prostheses carrying plate & cup modified independent.  (Target Date: 04/13/15)   Time 8   Period Weeks   Status New   PT LONG TERM GOAL #4    Title ambulates 300' with LRAD & bilateral prostheses modified independent.  (Target Date: 04/13/15)   Time 8   Period Weeks   Status New   PT LONG TERM GOAL #5   Title negotiates ramp, curb & stairs with LRAD & bilateral prostheses modified independent.  (Target Date: 04/13/15)   Time 8   Period Weeks   Status New   Additional Long Term Goals   Additional Long Term Goals Yes   PT LONG TERM GOAL #6   Title  Timed Up & Go with prostheses only <13.5 seconds  (Target Date: 04/13/15)   Time 8   Period Weeks   Status New   PT LONG TERM GOAL #7   Title Berg Balance >30/56  (Target Date: 04/13/15)   Time 8   Period Weeks   Status New           Plan - 04/03/15 0854    Clinical Impression Statement Advanced balance activities today without issues reported. Progressing toward goals.   Pt will benefit from skilled therapeutic intervention in order to improve on the following deficits Abnormal gait;Decreased activity tolerance;Decreased balance;Decreased endurance;Decreased knowledge of use of DME;Decreased mobility;Decreased range of motion;Decreased strength;Other (comment)  prosthetic dependency   Rehab Potential Good   PT Frequency 2x / week   PT Duration 8 weeks   PT Treatment/Interventions ADLs/Self Care Home Management;DME Instruction;Gait training;Stair training;Functional mobility training;Therapeutic activities;Therapeutic exercise;Balance training;Neuromuscular re-education;Patient/family education;Other (comment)  prosthetic training   PT Next Visit Plan review prosthetic care, gait with single point cane including barriers;dynamic balance/gait activities.   PT Home Exercise Plan balance HEP   Consulted and Agree with Plan of Care Patient        Problem List Patient Active Problem List   Diagnosis Date Noted  . Gastroparesis 11/02/2014  . Diabetes mellitus due to underlying condition with other specified complication   . Diabetic foot   . Nausea & vomiting   .  Essential hypertension 10/25/2014  . GERD (gastroesophageal reflux disease) 10/25/2014  . Cellulitis 10/24/2014  . Diabetes 10/24/2014  . Charcot foot due to diabetes mellitus 10/24/2014    Willow Ora 04/03/2015, 10:56 AM  Willow Ora, PTA, [facility] [address] [place], [place] [address] [phone] 04/03/2015, 10:56 AM

## 2015-04-09 ENCOUNTER — Ambulatory Visit: Payer: Commercial Managed Care - HMO | Admitting: Physical Therapy

## 2015-04-09 ENCOUNTER — Encounter: Payer: Self-pay | Admitting: Physical Therapy

## 2015-04-09 DIAGNOSIS — R2689 Other abnormalities of gait and mobility: Secondary | ICD-10-CM

## 2015-04-09 DIAGNOSIS — Z89612 Acquired absence of left leg above knee: Secondary | ICD-10-CM | POA: Diagnosis not present

## 2015-04-09 DIAGNOSIS — S88112D Complete traumatic amputation at level between knee and ankle, left lower leg, subsequent encounter: Secondary | ICD-10-CM | POA: Diagnosis not present

## 2015-04-09 DIAGNOSIS — Z7409 Other reduced mobility: Secondary | ICD-10-CM

## 2015-04-09 DIAGNOSIS — R269 Unspecified abnormalities of gait and mobility: Secondary | ICD-10-CM | POA: Diagnosis not present

## 2015-04-09 DIAGNOSIS — Z89611 Acquired absence of right leg above knee: Secondary | ICD-10-CM | POA: Diagnosis not present

## 2015-04-09 DIAGNOSIS — Z4781 Encounter for orthopedic aftercare following surgical amputation: Secondary | ICD-10-CM | POA: Diagnosis not present

## 2015-04-09 NOTE — Therapy (Signed)
[name] [address] [name], [place], [address] Phone: [phone]   Fax:  [phone]  Physical Therapy Treatment  Patient Details  Name: Diana Coleman MRN: [id number] Date of Birth: Jul 04, 1951 Referring Provider:  Lucianne Lei, MD  Encounter Date: 04/09/2015      PT End of Session - 04/09/15 0813    Visit Number 14   Number of Visits 17   Date for PT Re-Evaluation 04/13/15   PT Start Time 0807   PT Stop Time 0845   PT Time Calculation (min) 38 min   Equipment Utilized During Treatment Gait belt   Activity Tolerance Patient tolerated treatment well   Behavior During Therapy [facility] for tasks assessed/performed      Past Medical History  Diagnosis Date  . Type II diabetes mellitus   . Charcot foot due to diabetes mellitus   . Cellulitis of foot, left 10/24/2014    hx/notes 10/24/2014  . Osteomyelitis of right foot     hx/notes 10/24/2014    Past Surgical History  Procedure Laterality Date  . Above knee leg amputation Right 2004  . Tubal ligation  1973  . Reduction mammaplasty Bilateral 1987  . Foot surgery Left 1980's    "ulcer removed"  . Toe amputation Left ~ 2011    "top of my 3rd toe"  . Amputation Left 10/29/2014    Procedure: AMPUTATION BELOW KNEE - LEFT;  Surgeon: Newt Minion, MD;  Location: [facility];  Service: Orthopedics;  Laterality: Left;    There were no vitals filed for this visit.  Visit Diagnosis:  Abnormality of gait  Impaired functional mobility and activity tolerance  Balance problems      Subjective Assessment - 04/09/15 0812    Subjective No new compalints. No pain or new falls to report. Did a lot of shopping Saturday and was tired/sore afterwards.   Currently in Pain? No/denies   Pain Score 0-No pain          [facility] Adult PT Treatment/Exercise - 04/09/15 0814    Transfers   Sit to Stand 5: Supervision;With upper extremity assist;From chair/3-in-1   Stand to Sit 5:  Supervision;With upper extremity assist;To chair/3-in-1   Ambulation/Gait   Ambulation/Gait Yes   Ambulation/Gait Assistance 5: Supervision;4: Min guard   Ambulation/Gait Assistance Details cues on weight shifting, decreased base of support and for equal step length/stance time with gait. worked on sock ply adjustment with first couple of laps with the limited number pt had with her. Pt understands to bring additional socks with her where ever she goes to adjust as needed.                                Ambulation Distance (Feet) 220 Feet  x2, 440 x1   Assistive device Prostheses;Straight cane   Gait Pattern Step-through pattern;Decreased stride length;Decreased step length - right;Decreased step length - left;Wide base of support;Decreased trunk rotation   Ambulation Surface Level;Indoor   Ramp 4: Min assist  to min guard assist x 2 reps   Ramp Details (indicate cue type and reason) cues on sequence and posture   Curb 4: Min assist  to min guard assist x 2 reps   Curb Details (indicate cue type and reason) cues on prosthetic foot placement and posture   High Level Balance   High Level Balance Activities Negotiating over obstacles   High Level Balance Comments 4  laps stepping over 4 bolters of vaired heights with min assist to min guard assit and cues on sequence inititally                             Prosthetics   Current prosthetic wear tolerance (days/week)  7 days   Current prosthetic wear tolerance (#hours/day)  all awake hours both prostheses starting today   Residual limb condition  intact. has new bruise on left limb distal/lateral to patella area. Possibly due to wearing too many socks and having the prosthesis bar hitting too low.   Education Provided Residual limb care;Correct ply sock adjustment;Proper wear schedule/adjustment   Person(s) Educated Patient   Education Method Explanation;Demonstration   Education Method Verbalized understanding;Verbal cues required   Donning  Prosthesis Modified independent (device/increased time)   Doffing Prosthesis Modified independent (device/increased time)            PT Short Term Goals - 03/19/15 1302    PT SHORT TERM GOAL #1   Title demonstrates proper donning of prostheses (Target Date: 03/16/15)   Time 4   Period Weeks   Status Achieved   PT SHORT TERM GOAL #2   Title tolerates wear of bilateral prostheses >90% of awake hours with no skin issues and discomfort <2/10 (Target Date: 03/16/15)   Baseline 03/19/15- not met for left leg, met for right leg   Time 4   Period Weeks   Status Not Met   PT SHORT TERM GOAL #3   Title ambulates 200' with [facility] & prostheses modified independent. (Target Date: 03/16/15)   Time 4   Period Weeks   Status Achieved   PT SHORT TERM GOAL #4   Title negotiates ramp & curb with [facility] & prostheses with supervision. (Target Date: 03/16/15)   Baseline 03/19/15-still needed assist for balance at times   Time 4   Period Weeks   Status Not Met   PT SHORT TERM GOAL #5   Title manages clothes including pants for toileting and jacket in standing safely modified independent. (Target Date: 03/16/15)   Time 4   Period Weeks   Status Achieved           PT Long Term Goals - 02/15/15 0800    PT LONG TERM GOAL #1   Title independent with prosthetic care. (Target Date: 04/13/15)   Time 8   Period Weeks   Status New   PT LONG TERM GOAL #2   Title wears bilateral prostheses >90% of awake hours with no skin issues or pain.  (Target Date: 04/13/15)   Time 8   Period Weeks   Status New   PT LONG TERM GOAL #3   Title ambulates 100' with bilateral prostheses carrying plate & cup modified independent.  (Target Date: 04/13/15)   Time 8   Period Weeks   Status New   PT LONG TERM GOAL #4   Title ambulates 300' with LRAD & bilateral prostheses modified independent.  (Target Date: 04/13/15)   Time 8   Period Weeks   Status New   PT LONG TERM GOAL #5   Title negotiates ramp, curb & stairs with  LRAD & bilateral prostheses modified independent.  (Target Date: 04/13/15)   Time 8   Period Weeks   Status New   Additional Long Term Goals   Additional Long Term Goals Yes   PT LONG TERM GOAL #6   Title Timed Up & Go with prostheses  only <13.5 seconds  (Target Date: 04/13/15)   Time 8   Period Weeks   Status New   PT LONG TERM GOAL #7   Title Berg Balance >30/56  (Target Date: 04/13/15)   Time 8   Period Weeks   Status New           Plan - 04/09/15 0813    Clinical Impression Statement Pt continues to make progress toward goals.   Pt will benefit from skilled therapeutic intervention in order to improve on the following deficits Abnormal gait;Decreased activity tolerance;Decreased balance;Decreased endurance;Decreased knowledge of use of DME;Decreased mobility;Decreased range of motion;Decreased strength;Other (comment)  prosthetic dependency   Rehab Potential Good   PT Frequency 2x / week   PT Duration 8 weeks   PT Treatment/Interventions ADLs/Self Care Home Management;DME Instruction;Gait training;Stair training;Functional mobility training;Therapeutic activities;Therapeutic exercise;Balance training;Neuromuscular re-education;Patient/family education;Other (comment)  prosthetic training   PT Next Visit Plan assess LTG's next visit   PT Home Exercise Plan balance HEP   Consulted and Agree with Plan of Care Patient      Problem List Patient Active Problem List   Diagnosis Date Noted  . Gastroparesis 11/02/2014  . Diabetes mellitus due to underlying condition with other specified complication   . Diabetic foot   . Nausea & vomiting   . Essential hypertension 10/25/2014  . GERD (gastroesophageal reflux disease) 10/25/2014  . Cellulitis 10/24/2014  . Diabetes 10/24/2014  . Charcot foot due to diabetes mellitus 10/24/2014    Willow Ora 04/09/2015, 10:54 AM  Willow Ora, PTA, [place] [facility] [address], [place], [place]  [address] [phone] 04/09/2015, 10:54 AM

## 2015-04-10 ENCOUNTER — Telehealth: Payer: Self-pay | Admitting: Physical Therapy

## 2015-04-10 ENCOUNTER — Ambulatory Visit: Payer: Commercial Managed Care - HMO | Admitting: Physical Therapy

## 2015-04-10 ENCOUNTER — Encounter: Payer: Self-pay | Admitting: Physical Therapy

## 2015-04-10 DIAGNOSIS — Z89511 Acquired absence of right leg below knee: Secondary | ICD-10-CM

## 2015-04-10 DIAGNOSIS — Z89611 Acquired absence of right leg above knee: Secondary | ICD-10-CM | POA: Diagnosis not present

## 2015-04-10 DIAGNOSIS — Z89612 Acquired absence of left leg above knee: Secondary | ICD-10-CM | POA: Diagnosis not present

## 2015-04-10 DIAGNOSIS — R269 Unspecified abnormalities of gait and mobility: Secondary | ICD-10-CM

## 2015-04-10 DIAGNOSIS — R2689 Other abnormalities of gait and mobility: Secondary | ICD-10-CM

## 2015-04-10 DIAGNOSIS — Z7409 Other reduced mobility: Secondary | ICD-10-CM

## 2015-04-10 DIAGNOSIS — Z4781 Encounter for orthopedic aftercare following surgical amputation: Secondary | ICD-10-CM | POA: Diagnosis not present

## 2015-04-10 DIAGNOSIS — Z89512 Acquired absence of left leg below knee: Secondary | ICD-10-CM

## 2015-04-10 NOTE — Therapy (Signed)
[facility] [address] [place], [place], [address] Phone: [phone]   Fax:  [phone]  Physical Therapy Treatment  Patient Details  Name: Diana Coleman MRN: [id number] Date of Birth: 03/23/1951 Referring Provider:  Lucianne Lei, MD  Encounter Date: 04/10/2015      PT End of Session - 04/10/15 0800    Visit Number 15   Number of Visits 23   Date for PT Re-Evaluation 05/11/15   PT Start Time 0800   PT Stop Time 0845   PT Time Calculation (min) 45 min   Equipment Utilized During Treatment Gait belt   Activity Tolerance Patient tolerated treatment well   Behavior During Therapy [facility] for tasks assessed/performed      Past Medical History  Diagnosis Date  . Type II diabetes mellitus   . Charcot foot due to diabetes mellitus   . Cellulitis of foot, left 10/24/2014    hx/notes 10/24/2014  . Osteomyelitis of right foot     hx/notes 10/24/2014    Past Surgical History  Procedure Laterality Date  . Above knee leg amputation Right 2004  . Tubal ligation  1973  . Reduction mammaplasty Bilateral 1987  . Foot surgery Left 1980's    "ulcer removed"  . Toe amputation Left ~ 2011    "top of my 3rd toe"  . Amputation Left 10/29/2014    Procedure: AMPUTATION BELOW KNEE - LEFT;  Surgeon: Newt Minion, MD;  Location: [facility];  Service: Orthopedics;  Laterality: Left;    There were no vitals filed for this visit.  Visit Diagnosis:  Abnormality of gait  Impaired functional mobility and activity tolerance  Balance problems  Status post bilateral below knee amputation      Subjective Assessment - 04/10/15 0805    Subjective Pt went to prosthetist yesterday. Prosthetist thought pads were causing bruise on L residual limb. added padding in L socket   Currently in Pain? No/denies   Pain Score 0-No pain                 Transfers to "tub" without prostheses. Patient demo what she is currently doing and  feels she goes from quadriped to sitting too hard which is what PT observed. PT demo moving knees to one side and side sitting. Patient able to return demo & verbalized a lot more controlled and comfortable.        [facility] Adult PT Treatment/Exercise - 04/10/15 0800    Transfers   Sit to Stand 5: Supervision;With upper extremity assist;From chair/3-in-1   Stand to Sit 5: Supervision;With upper extremity assist;To chair/3-in-1   Ambulation/Gait   Ambulation/Gait Yes   Ambulation/Gait Assistance 5: Supervision;4: Min guard;4: Min assist  SBA with cane, min Guard with no device, minA on grass   Ambulation/Gait Assistance Details cues for gait speed and appropriate step length. Adjusted sock ply with cut off on L residual limb. Cueing to maintain momentum when approaching ramps and curbs in order to maintain balance.   Ambulation Distance (Feet) 450 Feet  130' x2, 450 x1   Assistive device Prostheses;Straight cane;Rollator   Gait Pattern Step-through pattern;Wide base of support;Decreased trunk rotation;Left flexed knee in stance   Ambulation Surface Level;Unlevel;Indoor;Outdoor;Paved;Gravel;Grass   Stairs No   Ramp 4: Min assist  cane & prostheses   Ramp Details (indicate cue type and reason) Cueing for sequencing and maintaining momentum   Curb 4: Min assist  cane &prostheses   Curb Details (indicate cue type and  reason) cues for sequencing of prostheses, SPC, and rollator   Gait Comments cane for general community distances, no device for household, rollator for longer community like shopping, conferences, etc   Dynamic Standing Balance   Dynamic Standing - Balance Support No upper extremity supported;Left upper extremity supported  Minimal standing between activities   Dynamic Standing - Level of Assistance 5: Stand by assistance   High Level Balance   High Level Balance Activities --   High Level Balance Comments --   Prosthetics   Current prosthetic wear tolerance (days/week)  7  days   Current prosthetic wear tolerance (#hours/day)  all awake hours both prostheses    Residual limb condition  intact. bruise still present on L residual limb, saw prosthetist yesterday and added additional padding in L socket   Education Provided Residual limb care;Correct ply sock adjustment  use of cut off sock on L residual limb   Person(s) Educated Patient   Education Method Explanation;Demonstration   Education Method Verbalized understanding;Needs further instruction   Donning Prosthesis Modified independent (device/increased time)   Doffing Prosthesis Modified independent (device/increased time)                PT Education - 04/10/15 0800    Education provided Yes   Education Details see prosthetic care, use of rollator over w/c for longer community issues, tub transfers   Person(s) Educated Patient   Methods Explanation;Demonstration   Comprehension Verbalized understanding;Returned demonstration;Need further instruction          PT Short Term Goals - 03/19/15 1302    PT SHORT TERM GOAL #1   Title demonstrates proper donning of prostheses (Target Date: 03/16/15)   Time 4   Period Weeks   Status Achieved   PT SHORT TERM GOAL #2   Title tolerates wear of bilateral prostheses >90% of awake hours with no skin issues and discomfort <2/10 (Target Date: 03/16/15)   Baseline 03/19/15- not met for left leg, met for right leg   Time 4   Period Weeks   Status Not Met   PT SHORT TERM GOAL #3   Title ambulates 200' with [facility] & prostheses modified independent. (Target Date: 03/16/15)   Time 4   Period Weeks   Status Achieved   PT SHORT TERM GOAL #4   Title negotiates ramp & curb with [facility] & prostheses with supervision. (Target Date: 03/16/15)   Baseline 03/19/15-still needed assist for balance at times   Time 4   Period Weeks   Status Not Met   PT SHORT TERM GOAL #5   Title manages clothes including pants for toileting and jacket in standing safely modified  independent. (Target Date: 03/16/15)   Time 4   Period Weeks   Status Achieved           PT Long Term Goals - 04/10/15 0800    PT LONG TERM GOAL #1   Title independent with prosthetic care. (NEW Target Date: 05/11/15)   Baseline 04/10/15 Partially MET Patient needs minimal cues to adjust ply socks.    Time 8   Period Weeks   Status Partially Met   PT LONG TERM GOAL #2   Title wears bilateral prostheses >90% of awake hours with no skin issues or pain.  (NEW Target Date: 05/11/15)   Baseline 04/10/15 Partially MET Patient has 2 new bruises on residual limb and small healing blister. Patient wearing prostheses >90% of awake hrs.   Time 8   Period Weeks   Status  Partially Met   PT LONG TERM GOAL #3   Title ambulates 100' with bilateral prostheses carrying plate & cup modified independent.  (NEW Target Date: 05/11/15)   Baseline 04/10/15 Partially MET Patient ambulates 69' with prostheses only no device with min guard.    Time 8   Period Weeks   Status Partially Met   PT LONG TERM GOAL #4   Title ambulates 300' with LRAD & bilateral prostheses modified independent.  (NEW Target Date: 05/11/15)   Baseline Partially MET 04/10/15 Patient ambulates >300' with single point cane & prostheses with supervision.    Time 8   Period Weeks   Status Partially Met   PT LONG TERM GOAL #5   Title negotiates ramp, curb & stairs with LRAD & bilateral prostheses modified independent.  (NEW Target Date: 05/11/15)   Baseline 04/10/15 Partially MET Patient neg ramp & curb with cane / prostheses with supervision.    Time 8   Period Weeks   Status Partially Met   PT LONG TERM GOAL #6   Title Timed Up & Go with prostheses only <13.5 seconds  (NEW Target Date: 05/11/15)   Baseline NOT RETESTED   Time 8   Period Weeks   Status On-going   PT LONG TERM GOAL #7   Title Berg Balance >30/56  (NEW Target Date: 05/11/15)   Baseline NOT RETESTED   Time 8   Period Weeks   Status On-going               Plan  - 04/10/15 0800    Clinical Impression Statement Patient is progressing towards all LTGs but has not fully met them yet. She needs further skilled care to meet LTGs and function at full community level. Patient would benefit from rollator walker with seat for longer community outings over using her w/c.    Pt will benefit from skilled therapeutic intervention in order to improve on the following deficits Abnormal gait;Decreased activity tolerance;Decreased balance;Decreased endurance;Decreased knowledge of use of DME;Decreased mobility;Decreased range of motion;Decreased strength;Other (comment)  prosthetic dependency   Rehab Potential Good   PT Frequency 2x / week   PT Duration 8 weeks   PT Treatment/Interventions ADLs/Self Care Home Management;DME Instruction;Gait training;Stair training;Functional mobility training;Therapeutic activities;Therapeutic exercise;Balance training;Neuromuscular re-education;Patient/family education;Other (comment)  prosthetic training   PT Next Visit Plan cont 4 more weeks to meet LTGs fully   Recommended Other Services rollator walker with seat   Consulted and Agree with Plan of Care Patient        Problem List Patient Active Problem List   Diagnosis Date Noted  . Gastroparesis 11/02/2014  . Diabetes mellitus due to underlying condition with other specified complication   . Diabetic foot   . Nausea & vomiting   . Essential hypertension 10/25/2014  . GERD (gastroesophageal reflux disease) 10/25/2014  . Cellulitis 10/24/2014  . Diabetes 10/24/2014  . Charcot foot due to diabetes mellitus 10/24/2014    Jamey Reas PT, DPT 04/10/2015, 12:48 PM  [facility] [address] [place], [place], [address] Phone: [phone]   Fax:  [phone]

## 2015-04-10 NOTE — Telephone Encounter (Signed)
Patient needs a rollator walker with seat to enable longer community mobility. Can you please write a prescription for a rollator walker with seat and FAX to [phone]? Jamey Reas, PT, DPT

## 2015-04-16 DIAGNOSIS — Z0000 Encounter for general adult medical examination without abnormal findings: Secondary | ICD-10-CM | POA: Diagnosis not present

## 2015-04-17 ENCOUNTER — Encounter: Payer: Self-pay | Admitting: Physical Therapy

## 2015-04-17 ENCOUNTER — Ambulatory Visit: Payer: Commercial Managed Care - HMO | Admitting: Physical Therapy

## 2015-04-17 DIAGNOSIS — Z89512 Acquired absence of left leg below knee: Secondary | ICD-10-CM

## 2015-04-17 DIAGNOSIS — Z89612 Acquired absence of left leg above knee: Secondary | ICD-10-CM | POA: Diagnosis not present

## 2015-04-17 DIAGNOSIS — Z89611 Acquired absence of right leg above knee: Secondary | ICD-10-CM | POA: Diagnosis not present

## 2015-04-17 DIAGNOSIS — R269 Unspecified abnormalities of gait and mobility: Secondary | ICD-10-CM

## 2015-04-17 DIAGNOSIS — Z89511 Acquired absence of right leg below knee: Secondary | ICD-10-CM

## 2015-04-17 DIAGNOSIS — Z4781 Encounter for orthopedic aftercare following surgical amputation: Secondary | ICD-10-CM | POA: Diagnosis not present

## 2015-04-17 DIAGNOSIS — R2689 Other abnormalities of gait and mobility: Secondary | ICD-10-CM

## 2015-04-17 DIAGNOSIS — Z7409 Other reduced mobility: Secondary | ICD-10-CM

## 2015-04-17 NOTE — Therapy (Signed)
[facility] [address] [place], [place], [address] Phone: [phone]   Fax:  [phone]  Physical Therapy Treatment  Patient Details  Name: Diana Coleman MRN: [id number] Date of Birth: 1951/10/06 Referring Provider:  Lucianne Lei, MD  Encounter Date: 04/17/2015      PT End of Session - 04/17/15 0800    Visit Number 16   Number of Visits 23   Date for PT Re-Evaluation 05/11/15   PT Start Time 0800   PT Stop Time 0845   PT Time Calculation (min) 45 min   Equipment Utilized During Treatment Gait belt   Activity Tolerance Patient tolerated treatment well   Behavior During Therapy [facility] for tasks assessed/performed      Past Medical History  Diagnosis Date  . Type II diabetes mellitus   . Charcot foot due to diabetes mellitus   . Cellulitis of foot, left 10/24/2014    hx/notes 10/24/2014  . Osteomyelitis of right foot     hx/notes 10/24/2014    Past Surgical History  Procedure Laterality Date  . Above knee leg amputation Right 2004  . Tubal ligation  1973  . Reduction mammaplasty Bilateral 1987  . Foot surgery Left 1980's    "ulcer removed"  . Toe amputation Left ~ 2011    "top of my 3rd toe"  . Amputation Left 10/29/2014    Procedure: AMPUTATION BELOW KNEE - LEFT;  Surgeon: Newt Minion, MD;  Location: [facility];  Service: Orthopedics;  Laterality: Left;    There were no vitals filed for this visit.  Visit Diagnosis:  Abnormality of gait  Impaired functional mobility and activity tolerance  Balance problems  Status post bilateral below knee amputation      Subjective Assessment - 04/17/15 0800    Subjective Per pt report, skin intact. States she is no longer using the cane in the home but still uses it for community amb.    Currently in Pain? No/denies      Patient ambulated with Supervision Device: Single point cane & prosthesis  Distance: 350'. Amb LBQC 100' x2 mod I.  Multi-modal cues for  deviations: Ambulating with LRAD, changing gait speed in order to navigate crowded community areas. PT demonstrated differences with tripod tip, Hurrycane, and standard SPC. Pt ambulated with all 3 canes, appeared safe during ambulation with all 3 but preferred the tripod tip.  Gait Velocity: 2.59 ft/sec with SPC at self selected leisure pace, 2.95 ft/sec with no assistive device at self selected leisure pace   1.31-2.62ft/sec is limited community ambulator Patient negotiated ramp with Supervision Device: Single point cane & prostheses  Patient negotiated curb with Supervision Device: Single point cane & prostheses  Patient negotiated stairs with Supervision Device: Single point cane and 1 hand rail & prostheses reciprocal pattern. Patient negotiated stairs with contact guard assist using SPC and no hand rails, step to gait pattern with prostheses.  TUG times: 17.93 sec with SPC, 15.04 sec with no assistive device.  Sit to stand with and without armrests. With PT demo and pt return demo with verbal cueing.                            PT Education - 04/17/15 [phone]    Education provided Yes   Education Details temporary covers for prostheses, differences in canes, sit to stand in chairs with and without armrests, donning liner and pin alignment   Person(s) Educated  Patient   Methods Explanation;Demonstration   Comprehension Verbalized understanding;Returned demonstration          PT Short Term Goals - 03/19/15 1302    PT SHORT TERM GOAL #1   Title demonstrates proper donning of prostheses (Target Date: 03/16/15)   Time 4   Period Weeks   Status Achieved   PT SHORT TERM GOAL #2   Title tolerates wear of bilateral prostheses >90% of awake hours with no skin issues and discomfort <2/10 (Target Date: 03/16/15)   Baseline 03/19/15- not met for left leg, met for right leg   Time 4   Period Weeks   Status Not Met   PT SHORT TERM GOAL #3   Title ambulates 200' with [facility] &  prostheses modified independent. (Target Date: 03/16/15)   Time 4   Period Weeks   Status Achieved   PT SHORT TERM GOAL #4   Title negotiates ramp & curb with [facility] & prostheses with supervision. (Target Date: 03/16/15)   Baseline 03/19/15-still needed assist for balance at times   Time 4   Period Weeks   Status Not Met   PT SHORT TERM GOAL #5   Title manages clothes including pants for toileting and jacket in standing safely modified independent. (Target Date: 03/16/15)   Time 4   Period Weeks   Status Achieved           PT Long Term Goals - 04/10/15 0800    PT LONG TERM GOAL #1   Title independent with prosthetic care. (NEW Target Date: 05/11/15)   Baseline 04/10/15 Partially MET Patient needs minimal cues to adjust ply socks.    Time 8   Period Weeks   Status Partially Met   PT LONG TERM GOAL #2   Title wears bilateral prostheses >90% of awake hours with no skin issues or pain.  (NEW Target Date: 05/11/15)   Baseline 04/10/15 Partially MET Patient has 2 new bruises on residual limb and small healing blister. Patient wearing prostheses >90% of awake hrs.   Time 8   Period Weeks   Status Partially Met   PT LONG TERM GOAL #3   Title ambulates 100' with bilateral prostheses carrying plate & cup modified independent.  (NEW Target Date: 05/11/15)   Baseline 04/10/15 Partially MET Patient ambulates 84' with prostheses only no device with min guard.    Time 8   Period Weeks   Status Partially Met   PT LONG TERM GOAL #4   Title ambulates 300' with LRAD & bilateral prostheses modified independent.  (NEW Target Date: 05/11/15)   Baseline Partially MET 04/10/15 Patient ambulates >300' with single point cane & prostheses with supervision.    Time 8   Period Weeks   Status Partially Met   PT LONG TERM GOAL #5   Title negotiates ramp, curb & stairs with LRAD & bilateral prostheses modified independent.  (NEW Target Date: 05/11/15)   Baseline 04/10/15 Partially MET Patient neg ramp & curb with  cane / prostheses with supervision.    Time 8   Period Weeks   Status Partially Met   PT LONG TERM GOAL #6   Title Timed Up & Go with prostheses only <13.5 seconds  (NEW Target Date: 05/11/15)   Baseline NOT RETESTED   Time 8   Period Weeks   Status On-going   PT LONG TERM GOAL #7   Title Berg Balance >30/56  (NEW Target Date: 05/11/15)   Baseline NOT RETESTED   Time 8  Period Weeks   Status On-going               Plan - 04/17/15 0800    Clinical Impression Statement Pt appears safe with any single point cane options but felt most comfortable with tripod tip. Pt improved sit to stand with instruction.    Pt will benefit from skilled therapeutic intervention in order to improve on the following deficits Abnormal gait;Decreased activity tolerance;Decreased balance;Decreased endurance;Decreased knowledge of use of DME;Decreased mobility;Decreased range of motion;Decreased strength;Other (comment)  prosthetic dependency   Rehab Potential Good   PT Frequency 2x / week   PT Duration 8 weeks   PT Treatment/Interventions ADLs/Self Care Home Management;DME Instruction;Gait training;Stair training;Functional mobility training;Therapeutic activities;Therapeutic exercise;Balance training;Neuromuscular re-education;Patient/family education;Other (comment)  prosthetic training   PT Next Visit Plan Continue community with cane and household without a device, gait and balance activities.    Consulted and Agree with Plan of Care Patient        Problem List Patient Active Problem List   Diagnosis Date Noted  . Gastroparesis 11/02/2014  . Diabetes mellitus due to underlying condition with other specified complication   . Diabetic foot   . Nausea & vomiting   . Essential hypertension 10/25/2014  . GERD (gastroesophageal reflux disease) 10/25/2014  . Cellulitis 10/24/2014  . Diabetes 10/24/2014  . Charcot foot due to diabetes mellitus 10/24/2014    Arlana Hove,  SPT 04/17/2015, 9:43 AM  [facility] [address] [place], [place], [address] Phone: [phone]   Fax:  [phone]

## 2015-04-18 DIAGNOSIS — E0800 Diabetes mellitus due to underlying condition with hyperosmolarity without nonketotic hyperglycemic-hyperosmolar coma (NKHHC): Secondary | ICD-10-CM | POA: Diagnosis not present

## 2015-04-18 DIAGNOSIS — E780 Pure hypercholesterolemia: Secondary | ICD-10-CM | POA: Diagnosis not present

## 2015-04-18 DIAGNOSIS — E0951 Drug or chemical induced diabetes mellitus with diabetic peripheral angiopathy without gangrene: Secondary | ICD-10-CM | POA: Diagnosis not present

## 2015-04-18 DIAGNOSIS — E0840 Diabetes mellitus due to underlying condition with diabetic neuropathy, unspecified: Secondary | ICD-10-CM | POA: Diagnosis not present

## 2015-04-18 DIAGNOSIS — E782 Mixed hyperlipidemia: Secondary | ICD-10-CM | POA: Diagnosis not present

## 2015-04-18 DIAGNOSIS — Z899 Acquired absence of limb, unspecified: Secondary | ICD-10-CM | POA: Diagnosis not present

## 2015-04-23 ENCOUNTER — Ambulatory Visit: Payer: Commercial Managed Care - HMO | Attending: Orthopedic Surgery | Admitting: Physical Therapy

## 2015-04-23 ENCOUNTER — Encounter: Payer: Self-pay | Admitting: Physical Therapy

## 2015-04-23 DIAGNOSIS — Z7409 Other reduced mobility: Secondary | ICD-10-CM

## 2015-04-23 DIAGNOSIS — R269 Unspecified abnormalities of gait and mobility: Secondary | ICD-10-CM | POA: Diagnosis not present

## 2015-04-23 DIAGNOSIS — Z89611 Acquired absence of right leg above knee: Secondary | ICD-10-CM | POA: Diagnosis not present

## 2015-04-23 DIAGNOSIS — Z4781 Encounter for orthopedic aftercare following surgical amputation: Secondary | ICD-10-CM | POA: Insufficient documentation

## 2015-04-23 DIAGNOSIS — Z89612 Acquired absence of left leg above knee: Secondary | ICD-10-CM | POA: Insufficient documentation

## 2015-04-23 NOTE — Therapy (Signed)
[facility] [address] [place], [place], [address] Phone: [phone]   Fax:  [phone]  Physical Therapy Treatment  Patient Details  Name: Diana Coleman MRN: [id number] Date of Birth: 05/13/1951 Referring Provider:  Lucianne Lei, MD  Encounter Date: 04/23/2015      PT End of Session - 04/23/15 1036    Visit Number 17   Number of Visits 23   Date for PT Re-Evaluation 05/11/15   PT Start Time 1015   PT Stop Time 1058   PT Time Calculation (min) 43 min   Equipment Utilized During Treatment Gait belt   Activity Tolerance Patient tolerated treatment well   Behavior During Therapy [facility] for tasks assessed/performed      Past Medical History  Diagnosis Date  . Type II diabetes mellitus   . Charcot foot due to diabetes mellitus   . Cellulitis of foot, left 10/24/2014    hx/notes 10/24/2014  . Osteomyelitis of right foot     hx/notes 10/24/2014    Past Surgical History  Procedure Laterality Date  . Above knee leg amputation Right 2004  . Tubal ligation  1973  . Reduction mammaplasty Bilateral 1987  . Foot surgery Left 1980's    "ulcer removed"  . Toe amputation Left ~ 2011    "top of my 3rd toe"  . Amputation Left 10/29/2014    Procedure: AMPUTATION BELOW KNEE - LEFT;  Surgeon: Newt Minion, MD;  Location: [facility];  Service: Orthopedics;  Laterality: Left;    There were no vitals filed for this visit.  Visit Diagnosis:  Abnormality of gait  Impaired functional mobility and activity tolerance      Subjective Assessment - 04/23/15 1019    Subjective Reports having a bad weekend, new skin issues on left limb. No falls. No pain currently, however had increased pain on Friday, limited wear on Friday/Saturday due to this. Was able to wear on Sunday.   Currently in Pain? No/denies   Pain Score 0-No pain            [facility] Adult PT Treatment/Exercise - 04/23/15 1036    Transfers   Sit to Stand 5:  Supervision;With upper extremity assist;From chair/3-in-1   Stand to Sit 5: Supervision;With upper extremity assist;To chair/3-in-1   Ambulation/Gait   Ambulation/Gait Yes   Ambulation/Gait Assistance 4: Min guard;4: Min assist   Ambulation/Gait Assistance Details cues on posture, cane placement and to decrease base of support with gait                 Ambulation Distance (Feet) 120 Feet  x 2 laps   Assistive device Straight cane;Prostheses  tripod tip cane   Gait Pattern Step-through pattern;Wide base of support;Decreased trunk rotation;Left flexed knee in stance   Ambulation Surface Level;Indoor   Knee/Hip Exercises: Aerobic   Stationary Bike Scifit x 4 extremities level 3.7 x 8 minutes with goal rpm >/=60 for strengthening and activity tolerance.                        Prosthetics   Prosthetic Care Comments  Instructed pt to decrease prosthetic wear on left leg until wound heals to only as needed with Tegaderm cover over blister area and open to air when not wearing liner/prosthesis                 Current prosthetic wear tolerance (days/week)  7 days   Current prosthetic wear tolerance (#hours/day)  all awake hours both prostheses    Residual limb condition  right intact, left with new blister that has popped open. Tegaderm applied to this area. Call placed to orthotist office. Pt to go in and have pads adjusted due to skin issues being created.                                  Education Provided Residual limb care;Proper wear schedule/adjustment   Person(s) Educated Patient   Education Method Explanation   Education Method Verbalized understanding   Donning Prosthesis Modified independent (device/increased time)   Doffing Prosthesis Modified independent (device/increased time)           PT Short Term Goals - 03/19/15 1302    PT SHORT TERM GOAL #1   Title demonstrates proper donning of prostheses (Target Date: 03/16/15)   Time 4   Period Weeks   Status Achieved   PT SHORT TERM  GOAL #2   Title tolerates wear of bilateral prostheses >90% of awake hours with no skin issues and discomfort <2/10 (Target Date: 03/16/15)   Baseline 03/19/15- not met for left leg, met for right leg   Time 4   Period Weeks   Status Not Met   PT SHORT TERM GOAL #3   Title ambulates 200' with [facility] & prostheses modified independent. (Target Date: 03/16/15)   Time 4   Period Weeks   Status Achieved   PT SHORT TERM GOAL #4   Title negotiates ramp & curb with [facility] & prostheses with supervision. (Target Date: 03/16/15)   Baseline 03/19/15-still needed assist for balance at times   Time 4   Period Weeks   Status Not Met   PT SHORT TERM GOAL #5   Title manages clothes including pants for toileting and jacket in standing safely modified independent. (Target Date: 03/16/15)   Time 4   Period Weeks   Status Achieved           PT Long Term Goals - 04/10/15 0800    PT LONG TERM GOAL #1   Title independent with prosthetic care. (NEW Target Date: 05/11/15)   Baseline 04/10/15 Partially MET Patient needs minimal cues to adjust ply socks.    Time 8   Period Weeks   Status Partially Met   PT LONG TERM GOAL #2   Title wears bilateral prostheses >90% of awake hours with no skin issues or pain.  (NEW Target Date: 05/11/15)   Baseline 04/10/15 Partially MET Patient has 2 new bruises on residual limb and small healing blister. Patient wearing prostheses >90% of awake hrs.   Time 8   Period Weeks   Status Partially Met   PT LONG TERM GOAL #3   Title ambulates 100' with bilateral prostheses carrying plate & cup modified independent.  (NEW Target Date: 05/11/15)   Baseline 04/10/15 Partially MET Patient ambulates 55' with prostheses only no device with min guard.    Time 8   Period Weeks   Status Partially Met   PT LONG TERM GOAL #4   Title ambulates 300' with LRAD & bilateral prostheses modified independent.  (NEW Target Date: 05/11/15)   Baseline Partially MET 04/10/15 Patient ambulates >300' with single  point cane & prostheses with supervision.    Time 8   Period Weeks   Status Partially Met   PT LONG TERM GOAL #5   Title negotiates ramp, curb & stairs with LRAD &  bilateral prostheses modified independent.  (NEW Target Date: 05/11/15)   Baseline 04/10/15 Partially MET Patient neg ramp & curb with cane / prostheses with supervision.    Time 8   Period Weeks   Status Partially Met   PT LONG TERM GOAL #6   Title Timed Up & Go with prostheses only <13.5 seconds  (NEW Target Date: 05/11/15)   Baseline NOT RETESTED   Time 8   Period Weeks   Status On-going   PT LONG TERM GOAL #7   Title Berg Balance >30/56  (NEW Target Date: 05/11/15)   Baseline NOT RETESTED   Time 8   Period Weeks   Status On-going           Plan - 04/23/15 1036    Clinical Impression Statement Limited weight bearing activities today due to new blister's on left residual limb. Pt to see [employer] today to have pad positions changed for improved skin integrity. Progressing toward goals.   Pt will benefit from skilled therapeutic intervention in order to improve on the following deficits Abnormal gait;Decreased activity tolerance;Decreased balance;Decreased endurance;Decreased knowledge of use of DME;Decreased mobility;Decreased range of motion;Decreased strength;Other (comment)  prosthetic dependency   Rehab Potential Good   PT Frequency 2x / week   PT Duration 8 weeks   PT Treatment/Interventions ADLs/Self Care Home Management;DME Instruction;Gait training;Stair training;Functional mobility training;Therapeutic activities;Therapeutic exercise;Balance training;Neuromuscular re-education;Patient/family education;Other (comment)  prosthetic training   PT Next Visit Plan Continue community with cane and household without a device, gait and balance activities.    Consulted and Agree with Plan of Care Patient      Problem List Patient Active Problem List   Diagnosis Date Noted  . Gastroparesis 11/02/2014  . Diabetes  mellitus due to underlying condition with other specified complication   . Diabetic foot   . Nausea & vomiting   . Essential hypertension 10/25/2014  . GERD (gastroesophageal reflux disease) 10/25/2014  . Cellulitis 10/24/2014  . Diabetes 10/24/2014  . Charcot foot due to diabetes mellitus 10/24/2014    Willow Ora 04/23/2015, 12:16 PM  Willow Ora, PTA, [facility] [address] [place],  [address] [phone] 04/23/2015, 12:16 PM

## 2015-04-24 ENCOUNTER — Encounter: Payer: Self-pay | Admitting: Physical Therapy

## 2015-04-24 ENCOUNTER — Ambulatory Visit: Payer: Commercial Managed Care - HMO | Admitting: Physical Therapy

## 2015-04-24 DIAGNOSIS — Z4781 Encounter for orthopedic aftercare following surgical amputation: Secondary | ICD-10-CM | POA: Diagnosis not present

## 2015-04-24 DIAGNOSIS — R2689 Other abnormalities of gait and mobility: Secondary | ICD-10-CM

## 2015-04-24 DIAGNOSIS — R269 Unspecified abnormalities of gait and mobility: Secondary | ICD-10-CM | POA: Diagnosis not present

## 2015-04-24 DIAGNOSIS — Z89611 Acquired absence of right leg above knee: Secondary | ICD-10-CM | POA: Diagnosis not present

## 2015-04-24 DIAGNOSIS — Z89612 Acquired absence of left leg above knee: Secondary | ICD-10-CM | POA: Diagnosis not present

## 2015-04-24 DIAGNOSIS — Z7409 Other reduced mobility: Secondary | ICD-10-CM

## 2015-04-24 NOTE — Therapy (Signed)
[facility] [address] [place], [place], [address] Phone: [phone]   Fax:  [phone]  Physical Therapy Treatment  Patient Details  Name: Diana Coleman MRN: [id number] Date of Birth: 07-Jun-1951 Referring Provider:  Lucianne Lei, MD  Encounter Date: 04/24/2015      PT End of Session - 04/24/15 0815    Visit Number 18   Number of Visits 23   Date for PT Re-Evaluation 05/11/15   PT Start Time 0805   PT Stop Time 0845   PT Time Calculation (min) 40 min   Equipment Utilized During Treatment Gait belt   Activity Tolerance Patient tolerated treatment well   Behavior During Therapy [facility] for tasks assessed/performed      Past Medical History  Diagnosis Date  . Type II diabetes mellitus   . Charcot foot due to diabetes mellitus   . Cellulitis of foot, left 10/24/2014    hx/notes 10/24/2014  . Osteomyelitis of right foot     hx/notes 10/24/2014    Past Surgical History  Procedure Laterality Date  . Above knee leg amputation Right 2004  . Tubal ligation  1973  . Reduction mammaplasty Bilateral 1987  . Foot surgery Left 1980's    "ulcer removed"  . Toe amputation Left ~ 2011    "top of my 3rd toe"  . Amputation Left 10/29/2014    Procedure: AMPUTATION BELOW KNEE - LEFT;  Surgeon: Newt Minion, MD;  Location: [facility];  Service: Orthopedics;  Laterality: Left;    There were no vitals filed for this visit.  Visit Diagnosis:  Abnormality of gait  Impaired functional mobility and activity tolerance  Balance problems      Subjective Assessment - 04/24/15 0813    Subjective No falls or new compliants. See's Ronalee Belts with [employer] today at 1:30 to have pads/socket checked.   Currently in Pain? No/denies   Pain Score 0-No pain           [facility] Adult PT Treatment/Exercise - 04/24/15 0815    Transfers   Sit to Stand 5: Supervision;With upper extremity assist;From chair/3-in-1   Stand to Sit 5: Supervision;With  upper extremity assist;To chair/3-in-1   Ambulation/Gait   Ambulation/Gait Yes   Ambulation/Gait Assistance 4: Min guard   Ambulation Distance (Feet) 345 Feet  x1   Assistive device Straight cane;Prostheses  tripod tip   Gait Pattern Step-through pattern;Wide base of support;Decreased trunk rotation;Left flexed knee in stance   Ambulation Surface Level;Indoor   Dynamic Standing Balance   Dynamic Standing - Balance Support No upper extremity supported;During functional activity   Dynamic Standing - Level of Assistance 4: Min assist   Dynamic Standing - Balance Activities Compliant surfaces;Eyes closed;Eyes open;Head turns;Head nods   Dynamic Standing - Comments on air ex in parallel bars: eyes closed no head movement, eyes open head nods and shakes, eyes closed head nods and shakes.                                     High Level Balance   High Level Balance Activities Side stepping;Tandem walking   High Level Balance Comments in parallel bars with light UE support: sides stepping left/right x 3 laps each way and tandem gait forward/backward x3 laps each way   Knee/Hip Exercises: Aerobic   Stationary Bike Nustep x 4 extremities level 4.0 x 8 minutes with goal >/= 50 steps per  minute for strengthening and activity tolerance   Prosthetics   Current prosthetic wear tolerance (days/week)  7 days   Current prosthetic wear tolerance (#hours/day)  right all awake hours. left prosthesis only as needed for activity until blisters heal   Residual limb condition  right limb intact; 2 blister areal on distal end of limb, one medially- possible suture coming through, and left blister, also 2 bruises along tibial area. Tegaderm applied to bil blister areas.                        Education Provided Residual limb care;Proper wear schedule/adjustment  Tegaderm application   Person(s) Educated Patient   Education Method Explanation   Education Method Verbalized understanding   Donning Prosthesis Modified  independent (device/increased time)   Doffing Prosthesis Modified independent (device/increased time)           PT Short Term Goals - 03/19/15 1302    PT SHORT TERM GOAL #1   Title demonstrates proper donning of prostheses (Target Date: 03/16/15)   Time 4   Period Weeks   Status Achieved   PT SHORT TERM GOAL #2   Title tolerates wear of bilateral prostheses >90% of awake hours with no skin issues and discomfort <2/10 (Target Date: 03/16/15)   Baseline 03/19/15- not met for left leg, met for right leg   Time 4   Period Weeks   Status Not Met   PT SHORT TERM GOAL #3   Title ambulates 200' with [facility] & prostheses modified independent. (Target Date: 03/16/15)   Time 4   Period Weeks   Status Achieved   PT SHORT TERM GOAL #4   Title negotiates ramp & curb with [facility] & prostheses with supervision. (Target Date: 03/16/15)   Baseline 03/19/15-still needed assist for balance at times   Time 4   Period Weeks   Status Not Met   PT SHORT TERM GOAL #5   Title manages clothes including pants for toileting and jacket in standing safely modified independent. (Target Date: 03/16/15)   Time 4   Period Weeks   Status Achieved           PT Long Term Goals - 04/10/15 0800    PT LONG TERM GOAL #1   Title independent with prosthetic care. (NEW Target Date: 05/11/15)   Baseline 04/10/15 Partially MET Patient needs minimal cues to adjust ply socks.    Time 8   Period Weeks   Status Partially Met   PT LONG TERM GOAL #2   Title wears bilateral prostheses >90% of awake hours with no skin issues or pain.  (NEW Target Date: 05/11/15)   Baseline 04/10/15 Partially MET Patient has 2 new bruises on residual limb and small healing blister. Patient wearing prostheses >90% of awake hrs.   Time 8   Period Weeks   Status Partially Met   PT LONG TERM GOAL #3   Title ambulates 100' with bilateral prostheses carrying plate & cup modified independent.  (NEW Target Date: 05/11/15)   Baseline 04/10/15 Partially MET  Patient ambulates 78' with prostheses only no device with min guard.    Time 8   Period Weeks   Status Partially Met   PT LONG TERM GOAL #4   Title ambulates 300' with LRAD & bilateral prostheses modified independent.  (NEW Target Date: 05/11/15)   Baseline Partially MET 04/10/15 Patient ambulates >300' with single point cane & prostheses with supervision.    Time 8  Period Weeks   Status Partially Met   PT LONG TERM GOAL #5   Title negotiates ramp, curb & stairs with LRAD & bilateral prostheses modified independent.  (NEW Target Date: 05/11/15)   Baseline 04/10/15 Partially MET Patient neg ramp & curb with cane / prostheses with supervision.    Time 8   Period Weeks   Status Partially Met   PT LONG TERM GOAL #6   Title Timed Up & Go with prostheses only <13.5 seconds  (NEW Target Date: 05/11/15)   Baseline NOT RETESTED   Time 8   Period Weeks   Status On-going   PT LONG TERM GOAL #7   Title Berg Balance >30/56  (NEW Target Date: 05/11/15)   Baseline NOT RETESTED   Time 8   Period Weeks   Status On-going           Plan - 04/24/15 0815    Clinical Impression Statement Continued with limited weight bearing activities today due to blister's on residual limb and not seeing prosthetist at this time. Despite this, pt is progressing toward goals.   Pt will benefit from skilled therapeutic intervention in order to improve on the following deficits Abnormal gait;Decreased activity tolerance;Decreased balance;Decreased endurance;Decreased knowledge of use of DME;Decreased mobility;Decreased range of motion;Decreased strength;Other (comment)  prosthetic dependency   Rehab Potential Good   PT Frequency 2x / week   PT Duration 8 weeks   PT Treatment/Interventions ADLs/Self Care Home Management;DME Instruction;Gait training;Stair training;Functional mobility training;Therapeutic activities;Therapeutic exercise;Balance training;Neuromuscular re-education;Patient/family education;Other (comment)   prosthetic training   PT Next Visit Plan Continue community with cane and household without a device, gait and balance activities.    Consulted and Agree with Plan of Care Patient      Problem List Patient Active Problem List   Diagnosis Date Noted  . Gastroparesis 11/02/2014  . Diabetes mellitus due to underlying condition with other specified complication   . Diabetic foot   . Nausea & vomiting   . Essential hypertension 10/25/2014  . GERD (gastroesophageal reflux disease) 10/25/2014  . Cellulitis 10/24/2014  . Diabetes 10/24/2014  . Charcot foot due to diabetes mellitus 10/24/2014    Willow Ora 04/24/2015, 12:21 PM  Willow Ora, PTA, [address] [place], [place] [address] [phone] 04/24/2015, 12:21 PM

## 2015-04-30 ENCOUNTER — Ambulatory Visit: Payer: Commercial Managed Care - HMO | Admitting: Physical Therapy

## 2015-04-30 ENCOUNTER — Encounter: Payer: Self-pay | Admitting: Physical Therapy

## 2015-04-30 DIAGNOSIS — Z89512 Acquired absence of left leg below knee: Secondary | ICD-10-CM

## 2015-04-30 DIAGNOSIS — Z7409 Other reduced mobility: Secondary | ICD-10-CM

## 2015-04-30 DIAGNOSIS — Z4781 Encounter for orthopedic aftercare following surgical amputation: Secondary | ICD-10-CM | POA: Diagnosis not present

## 2015-04-30 DIAGNOSIS — Z89611 Acquired absence of right leg above knee: Secondary | ICD-10-CM | POA: Diagnosis not present

## 2015-04-30 DIAGNOSIS — R2689 Other abnormalities of gait and mobility: Secondary | ICD-10-CM

## 2015-04-30 DIAGNOSIS — Z89612 Acquired absence of left leg above knee: Secondary | ICD-10-CM | POA: Diagnosis not present

## 2015-04-30 DIAGNOSIS — R269 Unspecified abnormalities of gait and mobility: Secondary | ICD-10-CM | POA: Diagnosis not present

## 2015-04-30 DIAGNOSIS — Z89511 Acquired absence of right leg below knee: Secondary | ICD-10-CM

## 2015-04-30 NOTE — Therapy (Signed)
[facility] [address] [place], [place], [address] Phone: [phone]   Fax:  [phone]  Physical Therapy Treatment  Patient Details  Name: Diana Coleman MRN: [id number] Date of Birth: 06/28/1951 Referring Provider:  Lucianne Lei, MD  Encounter Date: 04/30/2015      PT End of Session - 04/30/15 0850    Visit Number 19   Number of Visits 23   Date for PT Re-Evaluation 05/11/15   PT Start Time 0850   PT Stop Time 0935   PT Time Calculation (min) 45 min   Equipment Utilized During Treatment Gait belt   Activity Tolerance Patient tolerated treatment well   Behavior During Therapy [facility] for tasks assessed/performed      Past Medical History  Diagnosis Date  . Type II diabetes mellitus   . Charcot foot due to diabetes mellitus   . Cellulitis of foot, left 10/24/2014    hx/notes 10/24/2014  . Osteomyelitis of right foot     hx/notes 10/24/2014    Past Surgical History  Procedure Laterality Date  . Above knee leg amputation Right 2004  . Tubal ligation  1973  . Reduction mammaplasty Bilateral 1987  . Foot surgery Left 1980's    "ulcer removed"  . Toe amputation Left ~ 2011    "top of my 3rd toe"  . Amputation Left 10/29/2014    Procedure: AMPUTATION BELOW KNEE - LEFT;  Surgeon: Newt Minion, MD;  Location: [facility];  Service: Orthopedics;  Laterality: Left;    There were no vitals filed for this visit.  Visit Diagnosis:  Abnormality of gait  Impaired functional mobility and activity tolerance  Balance problems  Status post bilateral below knee amputation      Subjective Assessment - 04/30/15 0852    Subjective Pt walked into PT session with SPC with modified tip. Blister on L residual limb is stable, had opened over weekend with superfcial skin peeling away, does not appear deeper. Pt being fitted for new socket, going to [facility] today to fit check socket.      Wound is superficial with no signs of  infection. PT removed loose skin to decrease risk of infection. PT applied Tegaderm and instructed pt to continue use of Tegaderm with limited left prosthesis use only as needed during the day; continue with right prosthesis wear all awake hours; use gauze under compression wrap at night to permit wound to dry after wetness from Tegaderm. Patient verbalized understanding.  Prosthetic Training: Patient ambulated with Device: Single point cane & prostheses  Distance: 150' x3 on Indoor  Level surface.        Patient ambulated with Device: Single point cane & prostheses  Distance: 1000' x1 on Outdoor  Concrete grass and gravel surfaces.  Multi-modal cues for deviations:maintaining momentum with uneven surfaces and stepping over objects.  Patient negotiated ramp with  Device: Single point cane & prostheses  Patient negotiated curb with Device: Single point cane & prostheses         Pt performed over objects using single point cane with modified tip. Was able to negotiate 6 objects of various height across 50'. Required verbal cueing for sequencing and placement of prosthesis upon approaching object. Demonstrated side stepping pattern for higher objects.               Pt able to lift and amb with laundry basket, no AD and use of prostheses for 24' x2 with verbal cues for sequencing, prosthesis placement  during lifting and lowering. Pt demonstrated proper body mechanics and safe amb with objects.                          PT Education - 04/30/15 0850    Education Details Instructed pt to keep wound clean as it is currently no worse than when we saw it last week. Dress with new tegaderm each morning prior to putting on prosthesis. Allow the wound to receive air each night with gauze and shrinker sock. Instructed pt to Decrease prosthesis wear time and long term weight bearing activities until wound is more healed.    Person(s) Educated Patient   Methods Explanation   Comprehension  Verbalized understanding          PT Short Term Goals - 03/19/15 1302    PT SHORT TERM GOAL #1   Title demonstrates proper donning of prostheses (Target Date: 03/16/15)   Time 4   Period Weeks   Status Achieved   PT SHORT TERM GOAL #2   Title tolerates wear of bilateral prostheses >90% of awake hours with no skin issues and discomfort <2/10 (Target Date: 03/16/15)   Baseline 03/19/15- not met for left leg, met for right leg   Time 4   Period Weeks   Status Not Met   PT SHORT TERM GOAL #3   Title ambulates 200' with [facility] & prostheses modified independent. (Target Date: 03/16/15)   Time 4   Period Weeks   Status Achieved   PT SHORT TERM GOAL #4   Title negotiates ramp & curb with [facility] & prostheses with supervision. (Target Date: 03/16/15)   Baseline 03/19/15-still needed assist for balance at times   Time 4   Period Weeks   Status Not Met   PT SHORT TERM GOAL #5   Title manages clothes including pants for toileting and jacket in standing safely modified independent. (Target Date: 03/16/15)   Time 4   Period Weeks   Status Achieved           PT Long Term Goals - 04/10/15 0800    PT LONG TERM GOAL #1   Title independent with prosthetic care. (NEW Target Date: 05/11/15)   Baseline 04/10/15 Partially MET Patient needs minimal cues to adjust ply socks.    Time 8   Period Weeks   Status Partially Met   PT LONG TERM GOAL #2   Title wears bilateral prostheses >90% of awake hours with no skin issues or pain.  (NEW Target Date: 05/11/15)   Baseline 04/10/15 Partially MET Patient has 2 new bruises on residual limb and small healing blister. Patient wearing prostheses >90% of awake hrs.   Time 8   Period Weeks   Status Partially Met   PT LONG TERM GOAL #3   Title ambulates 100' with bilateral prostheses carrying plate & cup modified independent.  (NEW Target Date: 05/11/15)   Baseline 04/10/15 Partially MET Patient ambulates 93' with prostheses only no device with min guard.    Time 8    Period Weeks   Status Partially Met   PT LONG TERM GOAL #4   Title ambulates 300' with LRAD & bilateral prostheses modified independent.  (NEW Target Date: 05/11/15)   Baseline Partially MET 04/10/15 Patient ambulates >300' with single point cane & prostheses with supervision.    Time 8   Period Weeks   Status Partially Met   PT LONG TERM GOAL #5   Title negotiates ramp,  curb & stairs with LRAD & bilateral prostheses modified independent.  (NEW Target Date: 05/11/15)   Baseline 04/10/15 Partially MET Patient neg ramp & curb with cane / prostheses with supervision.    Time 8   Period Weeks   Status Partially Met   PT LONG TERM GOAL #6   Title Timed Up & Go with prostheses only <13.5 seconds  (NEW Target Date: 05/11/15)   Baseline NOT RETESTED   Time 8   Period Weeks   Status On-going   PT LONG TERM GOAL #7   Title Berg Balance >30/56  (NEW Target Date: 05/11/15)   Baseline NOT RETESTED   Time 8   Period Weeks   Status On-going               Plan - 04/30/15 0850    Clinical Impression Statement Returned to [facility] activities and ambulation during therapy with no issues or pain. Pt ambulated well and uses new SPC properly.    Pt will benefit from skilled therapeutic intervention in order to improve on the following deficits Abnormal gait;Decreased activity tolerance;Decreased balance;Decreased endurance;Decreased knowledge of use of DME;Decreased mobility;Decreased range of motion;Decreased strength;Other (comment)  prosthetic dependency   Rehab Potential Good   PT Frequency 2x / week   PT Duration 8 weeks   PT Treatment/Interventions ADLs/Self Care Home Management;DME Instruction;Gait training;Stair training;Functional mobility training;Therapeutic activities;Therapeutic exercise;Balance training;Neuromuscular re-education;Patient/family education;Other (comment)  prosthetic training   PT Next Visit Plan Continue community with [facility] and household without a device, gait and balance  activities. Functional household chores, lifting and carrying objects for higher level balance.    Consulted and Agree with Plan of Care Patient        Problem List Patient Active Problem List   Diagnosis Date Noted  . Gastroparesis 11/02/2014  . Diabetes mellitus due to underlying condition with other specified complication   . Diabetic foot   . Nausea & vomiting   . Essential hypertension 10/25/2014  . GERD (gastroesophageal reflux disease) 10/25/2014  . Cellulitis 10/24/2014  . Diabetes 10/24/2014  . Charcot foot due to diabetes mellitus 10/24/2014   Jamey Reas, PT, DPT PT Specializing in [place] 04/30/2015 8:14 PM Phone:  [phone]  Fax:  [phone] [facility] [address] [place], [place] [address]  Chaela Branscum Blair Lalena Salas, [place] 04/30/2015, 8:09 PM  [facility] [address] [place], [place], [address] Phone: [phone]   Fax:  [phone]

## 2015-05-01 ENCOUNTER — Ambulatory Visit: Payer: Commercial Managed Care - HMO | Admitting: Physical Therapy

## 2015-05-01 DIAGNOSIS — Z7409 Other reduced mobility: Secondary | ICD-10-CM

## 2015-05-01 DIAGNOSIS — R269 Unspecified abnormalities of gait and mobility: Secondary | ICD-10-CM | POA: Diagnosis not present

## 2015-05-01 DIAGNOSIS — Z89611 Acquired absence of right leg above knee: Secondary | ICD-10-CM | POA: Diagnosis not present

## 2015-05-01 DIAGNOSIS — Z89612 Acquired absence of left leg above knee: Secondary | ICD-10-CM | POA: Diagnosis not present

## 2015-05-01 DIAGNOSIS — R2689 Other abnormalities of gait and mobility: Secondary | ICD-10-CM

## 2015-05-01 DIAGNOSIS — Z4781 Encounter for orthopedic aftercare following surgical amputation: Secondary | ICD-10-CM | POA: Diagnosis not present

## 2015-05-01 NOTE — Therapy (Signed)
[facility] [address] [place], [place], [address] Phone: [phone]   Fax:  [phone]  Physical Therapy Treatment  Patient Details  Name: Diana Coleman MRN: [id number] Date of Birth: 10-24-1951 Referring Provider:  Lucianne Lei, MD  Encounter Date: 05/01/2015      PT End of Session - 05/01/15 0812    Visit Number 20   Number of Visits 23   Date for PT Re-Evaluation 05/11/15   PT Start Time 0810   PT Stop Time 0845   PT Time Calculation (min) 35 min   Equipment Utilized During Treatment Gait belt   Activity Tolerance Patient tolerated treatment well   Behavior During Therapy [facility] for tasks assessed/performed      Past Medical History  Diagnosis Date  . Type II diabetes mellitus   . Charcot foot due to diabetes mellitus   . Cellulitis of foot, left 10/24/2014    hx/notes 10/24/2014  . Osteomyelitis of right foot     hx/notes 10/24/2014    Past Surgical History  Procedure Laterality Date  . Above knee leg amputation Right 2004  . Tubal ligation  1973  . Reduction mammaplasty Bilateral 1987  . Foot surgery Left 1980's    "ulcer removed"  . Toe amputation Left ~ 2011    "top of my 3rd toe"  . Amputation Left 10/29/2014    Procedure: AMPUTATION BELOW KNEE - LEFT;  Surgeon: Newt Minion, MD;  Location: [facility];  Service: Orthopedics;  Laterality: Left;    There were no vitals filed for this visit.  Visit Diagnosis:  Abnormality of gait  Impaired functional mobility and activity tolerance  Balance problems      Subjective Assessment - 05/01/15 0810    Subjective No new complaints. No falls or pain to report. Goes again next Tuesday for socket check.   Currently in Pain? No/denies   Pain Score 0-No pain           [facility] Adult PT Treatment/Exercise - 05/01/15 0813    Transfers   Sit to Stand 5: Supervision;With upper extremity assist;From chair/3-in-1   Stand to Sit 5: Supervision;With upper  extremity assist;To chair/3-in-1   Ambulation/Gait   Ambulation/Gait Yes   Ambulation/Gait Assistance 5: Supervision   Ambulation/Gait Assistance Details cues on decreased base of support with both cane and no AD use, cues on posture.   Ambulation Distance (Feet) 220 Feet  x1 with cane; 120 without AD   Assistive device Straight cane;Prostheses  with rubber tip   Gait Pattern Step-through pattern;Wide base of support;Decreased trunk rotation;Left flexed knee in stance   Ambulation Surface Level;Indoor   Stairs Yes   Stairs Assistance 5: Supervision   Stairs Assistance Details (indicate cue type and reason) cues on hand advancement on rail, toe placement on step with descending  and to slow down descent for safety                    Stair Management Technique One rail Right;With cane;Alternating pattern;Forwards   Number of Stairs 4  X 3 reps   Ramp 4: Min assist  x 2 reps   Ramp Details (indicate cue type and reason) cues on step length and to slow down descent for safety, this improved with repetition                     Curb 4: Min assist  x 2 reps   Curb Details (indicate cue type  and reason) cues on sequence and foot placement for stability                          Berg Balance Test   Sit to Stand Able to stand without using hands and stabilize independently   Standing Unsupported Able to stand safely 2 minutes   Sitting with Back Unsupported but Feet Supported on Floor or Stool Able to sit safely and securely 2 minutes   Stand to Sit Sits safely with minimal use of hands   Transfers Able to transfer safely, definite need of hands   Standing Unsupported with Eyes Closed Able to stand 10 seconds safely   Standing Ubsupported with Feet Together Able to place feet together independently and stand for 1 minute with supervision   From Standing, Reach Forward with Outstretched Arm Can reach confidently >25 cm (10")   From Standing Position, Pick up Object from [place] to pick up shoe  safely and easily   From Standing Position, Turn to Look Behind Over each Shoulder Looks behind one side only/other side shows less weight shift   Turn 360 Degrees Able to turn 360 degrees safely but slowly  > 5 sec's each way   Standing Unsupported, Alternately Place Feet on Step/Stool Able to complete >2 steps/needs minimal assist  8 with cane support   Standing Unsupported, One Foot in Front Able to plae foot ahead of the other independently and hold 30 seconds   Standing on One Leg Tries to lift leg/unable to hold 3 seconds but remains standing independently   Total Score 44   Timed Up and Go Test   TUG Normal TUG   Normal TUG (seconds) 12.85  with cane           PT Short Term Goals - 03/19/15 1302    PT SHORT TERM GOAL #1   Title demonstrates proper donning of prostheses (Target Date: 03/16/15)   Time 4   Period Weeks   Status Achieved   PT SHORT TERM GOAL #2   Title tolerates wear of bilateral prostheses >90% of awake hours with no skin issues and discomfort <2/10 (Target Date: 03/16/15)   Baseline 03/19/15- not met for left leg, met for right leg   Time 4   Period Weeks   Status Not Met   PT SHORT TERM GOAL #3   Title ambulates 200' with [facility] & prostheses modified independent. (Target Date: 03/16/15)   Time 4   Period Weeks   Status Achieved   PT SHORT TERM GOAL #4   Title negotiates ramp & curb with [facility] & prostheses with supervision. (Target Date: 03/16/15)   Baseline 03/19/15-still needed assist for balance at times   Time 4   Period Weeks   Status Not Met   PT SHORT TERM GOAL #5   Title manages clothes including pants for toileting and jacket in standing safely modified independent. (Target Date: 03/16/15)   Time 4   Period Weeks   Status Achieved           PT Long Term Goals - 05/01/15 0912    PT LONG TERM GOAL #1   Title independent with prosthetic care. (NEW Target Date: 05/11/15)   Baseline 04/10/15 Partially MET Patient needs minimal cues to adjust ply  socks.    Time 8   Period Weeks   Status Partially Met   PT LONG TERM GOAL #2   Title wears bilateral prostheses >90% of  awake hours with no skin issues or pain.  (NEW Target Date: 05/11/15)   Baseline 04/10/15 Partially MET Patient has 2 new bruises on residual limb and small healing blister. Patient wearing prostheses >90% of awake hrs.   Time 8   Period Weeks   Status Partially Met   PT LONG TERM GOAL #3   Title ambulates 100' with bilateral prostheses carrying plate & cup modified independent.  (NEW Target Date: 05/11/15)   Baseline 04/10/15 Partially MET Patient ambulates 17' with prostheses only no device with min guard.    Time 8   Period Weeks   Status Partially Met   PT LONG TERM GOAL #4   Title ambulates 300' with LRAD & bilateral prostheses modified independent.  (NEW Target Date: 05/11/15)   Baseline Partially MET 04/10/15 Patient ambulates >300' with single point cane & prostheses with supervision.    Time 8   Period Weeks   Status Partially Met   PT LONG TERM GOAL #5   Title negotiates ramp, curb & stairs with LRAD & bilateral prostheses modified independent.  (NEW Target Date: 05/11/15)   Baseline 04/10/15 Partially MET Patient neg ramp & curb with cane / prostheses with supervision.    Time 8   Period Weeks   Status Partially Met   PT LONG TERM GOAL #6   Title Timed Up & Go with prostheses only <13.5 seconds  (NEW Target Date: 05/11/15)   Baseline NOT RETESTED   Time 8   Period Weeks   Status On-going   PT LONG TERM GOAL #7   Title Berg Balance >30/56  (NEW Target Date: 05/11/15)   Baseline on 05/01/15: scored 44/56   Time 8   Period Weeks   Status Achieved          Plan - 05/01/15 4081    Clinical Impression Statement Pt with improved Berg Balance Test and timed up and go scores today. Progressing toward goals.   Pt will benefit from skilled therapeutic intervention in order to improve on the following deficits Abnormal gait;Decreased activity tolerance;Decreased  balance;Decreased endurance;Decreased knowledge of use of DME;Decreased mobility;Decreased range of motion;Decreased strength;Other (comment)  prosthetic dependency   Rehab Potential Good   PT Frequency 2x / week   PT Duration 8 weeks   PT Treatment/Interventions ADLs/Self Care Home Management;DME Instruction;Gait training;Stair training;Functional mobility training;Therapeutic activities;Therapeutic exercise;Balance training;Neuromuscular re-education;Patient/family education;Other (comment)  prosthetic training   PT Next Visit Plan Continue community with [facility] and household without a device, gait and balance activities. Functional household chores, lifting and carrying objects for higher level balance.    Consulted and Agree with Plan of Care Patient      Problem List Patient Active Problem List   Diagnosis Date Noted  . Gastroparesis 11/02/2014  . Diabetes mellitus due to underlying condition with other specified complication   . Diabetic foot   . Nausea & vomiting   . Essential hypertension 10/25/2014  . GERD (gastroesophageal reflux disease) 10/25/2014  . Cellulitis 10/24/2014  . Diabetes 10/24/2014  . Charcot foot due to diabetes mellitus 10/24/2014    Willow Ora 05/01/2015, 9:13 AM  Willow Ora, PTA, [facility] [address] [place], [place] [address] [phone] 05/01/2015, 9:13 AM

## 2015-05-07 ENCOUNTER — Ambulatory Visit: Payer: Commercial Managed Care - HMO | Admitting: Physical Therapy

## 2015-05-08 ENCOUNTER — Encounter: Payer: Self-pay | Admitting: Physical Therapy

## 2015-05-08 ENCOUNTER — Ambulatory Visit: Payer: Commercial Managed Care - HMO | Admitting: Physical Therapy

## 2015-05-08 DIAGNOSIS — Z89612 Acquired absence of left leg above knee: Secondary | ICD-10-CM | POA: Diagnosis not present

## 2015-05-08 DIAGNOSIS — Z89611 Acquired absence of right leg above knee: Secondary | ICD-10-CM | POA: Diagnosis not present

## 2015-05-08 DIAGNOSIS — Z4781 Encounter for orthopedic aftercare following surgical amputation: Secondary | ICD-10-CM | POA: Diagnosis not present

## 2015-05-08 DIAGNOSIS — Z7409 Other reduced mobility: Secondary | ICD-10-CM

## 2015-05-08 DIAGNOSIS — R269 Unspecified abnormalities of gait and mobility: Secondary | ICD-10-CM

## 2015-05-08 NOTE — Therapy (Signed)
[facility] [address] [place], [place], [address] Phone: [phone]   Fax:  [phone]  Physical Therapy Treatment  Patient Details  Name: Diana Coleman MRN: [id number] Date of Birth: [birth year] Referring Provider:  Lucianne Lei, MD  Encounter Date: 05/08/2015      PT End of Session - 05/08/15 0810    Visit Number 21   Number of Visits 23   Date for PT Re-Evaluation 05/11/15   PT Start Time 0803   PT Stop Time 0842   PT Time Calculation (min) 39 min   Equipment Utilized During Treatment Gait belt   Activity Tolerance Patient tolerated treatment well   Behavior During Therapy [facility] for tasks assessed/performed      Past Medical History  Diagnosis Date  . Type II diabetes mellitus   . Charcot foot due to diabetes mellitus   . Cellulitis of foot, left 10/24/2014    hx/notes 10/24/2014  . Osteomyelitis of right foot     hx/notes 10/24/2014    Past Surgical History  Procedure Laterality Date  . Above knee leg amputation Right 2004  . Tubal ligation  1973  . Reduction mammaplasty Bilateral 1987  . Foot surgery Left 1980's    "ulcer removed"  . Toe amputation Left ~ 2011    "top of my 3rd toe"  . Amputation Left 10/29/2014    Procedure: AMPUTATION BELOW KNEE - LEFT;  Surgeon: Newt Minion, MD;  Location: [facility];  Service: Orthopedics;  Laterality: Left;    There were no vitals filed for this visit.  Visit Diagnosis:  Abnormality of gait  Impaired functional mobility and activity tolerance      Subjective Assessment - 05/08/15 0809    Subjective No new complaints. No falls or pain to report. Goes today to see prosthetist for socket check on new prosthesis.   Currently in Pain? No/denies   Pain Score 0-No pain           [facility] Adult PT Treatment/Exercise - 05/08/15 0810    Transfers   Sit to Stand 5: Supervision;With upper extremity assist;From chair/3-in-1   Stand to Sit 5: Supervision;With  upper extremity assist;To chair/3-in-1   Ambulation/Gait   Ambulation/Gait Yes   Ambulation/Gait Assistance 5: Supervision   Ambulation/Gait Assistance Details cues on posture and equal step/stride length   Ambulation Distance (Feet) 450 Feet   Assistive device Straight cane;Prostheses  with rubber tip   Gait Pattern Step-through pattern;Wide base of support;Decreased trunk rotation;Left flexed knee in stance   Ambulation Surface Level;Indoor   Stairs Yes   Stairs Assistance 5: Supervision   Stairs Assistance Details (indicate cue type and reason) cues on foot position with gait   Stair Management Technique One rail Right;With cane;Alternating pattern;Forwards   Number of Stairs --  x 2 reps   Ramp 4: Min assist  x 2 reps   Ramp Details (indicate cue type and reason) cues on step length and posture   Curb 4: Min assist  x 2 reps   Curb Details (indicate cue type and reason) cues on sequence and posture   Knee/Hip Exercises: Aerobic   Stationary Bike Nustep x 4 extremities level 3.5 x 8 minutes with goal >/= 50 steps per minute for strengthening and activity tolerance   Prosthetics   Current prosthetic wear tolerance (days/week)  7 days   Current prosthetic wear tolerance (#hours/day)  right all awake hours. left prosthesis only as needed for activity until blisters heal  Residual limb condition  right limb intact; left limb: lateral blister covered with Tegaderm, appears to be healing. New small blister on medial aspect of incision line. Pink, no odor. Covered with Tegaderm.              Education Provided Residual limb care;Correct ply sock adjustment;Proper wear schedule/adjustment   Person(s) Educated Patient   Education Method Explanation;Demonstration   Education Method Verbalized understanding   Donning Prosthesis Modified independent (device/increased time)   Doffing Prosthesis Modified independent (device/increased time)            PT Short Term Goals - 03/19/15 1302     PT SHORT TERM GOAL #1   Title demonstrates proper donning of prostheses (Target Date: 03/16/15)   Time 4   Period Weeks   Status Achieved   PT SHORT TERM GOAL #2   Title tolerates wear of bilateral prostheses >90% of awake hours with no skin issues and discomfort <2/10 (Target Date: 03/16/15)   Baseline 03/19/15- not met for left leg, met for right leg   Time 4   Period Weeks   Status Not Met   PT SHORT TERM GOAL #3   Title ambulates 200' with [facility] & prostheses modified independent. (Target Date: 03/16/15)   Time 4   Period Weeks   Status Achieved   PT SHORT TERM GOAL #4   Title negotiates ramp & curb with [facility] & prostheses with supervision. (Target Date: 03/16/15)   Baseline 03/19/15-still needed assist for balance at times   Time 4   Period Weeks   Status Not Met   PT SHORT TERM GOAL #5   Title manages clothes including pants for toileting and jacket in standing safely modified independent. (Target Date: 03/16/15)   Time 4   Period Weeks   Status Achieved           PT Long Term Goals - 05/01/15 0912    PT LONG TERM GOAL #1   Title independent with prosthetic care. (NEW Target Date: 05/11/15)   Baseline 04/10/15 Partially MET Patient needs minimal cues to adjust ply socks.    Time 8   Period Weeks   Status Partially Met   PT LONG TERM GOAL #2   Title wears bilateral prostheses >90% of awake hours with no skin issues or pain.  (NEW Target Date: 05/11/15)   Baseline 04/10/15 Partially MET Patient has 2 new bruises on residual limb and small healing blister. Patient wearing prostheses >90% of awake hrs.   Time 8   Period Weeks   Status Partially Met   PT LONG TERM GOAL #3   Title ambulates 100' with bilateral prostheses carrying plate & cup modified independent.  (NEW Target Date: 05/11/15)   Baseline 04/10/15 Partially MET Patient ambulates 43' with prostheses only no device with min guard.    Time 8   Period Weeks   Status Partially Met   PT LONG TERM GOAL #4   Title  ambulates 300' with LRAD & bilateral prostheses modified independent.  (NEW Target Date: 05/11/15)   Baseline Partially MET 04/10/15 Patient ambulates >300' with single point cane & prostheses with supervision.    Time 8   Period Weeks   Status Partially Met   PT LONG TERM GOAL #5   Title negotiates ramp, curb & stairs with LRAD & bilateral prostheses modified independent.  (NEW Target Date: 05/11/15)   Baseline 04/10/15 Partially MET Patient neg ramp & curb with cane / prostheses with supervision.  Time 8   Period Weeks   Status Partially Met   PT LONG TERM GOAL #6   Title Timed Up & Go with prostheses only <13.5 seconds  (NEW Target Date: 05/11/15)   Baseline NOT RETESTED   Time 8   Period Weeks   Status On-going   PT LONG TERM GOAL #7   Title Berg Balance >30/56  (NEW Target Date: 05/11/15)   Baseline on 05/01/15: scored 44/56   Time 8   Period Weeks   Status Achieved           Plan - 05/08/15 0810    Clinical Impression Statement Limited weight bearing/standing activity today due to new blister on pt's residual limb. Advised pt to have pads adjusted in current socked today if not getting the new socket so to avoid additional new blisters.   Pt will benefit from skilled therapeutic intervention in order to improve on the following deficits Abnormal gait;Decreased activity tolerance;Decreased balance;Decreased endurance;Decreased knowledge of use of DME;Decreased mobility;Decreased range of motion;Decreased strength;Other (comment)  prosthetic dependency   Rehab Potential Good   PT Frequency 2x / week   PT Duration 8 weeks   PT Treatment/Interventions ADLs/Self Care Home Management;DME Instruction;Gait training;Stair training;Functional mobility training;Therapeutic activities;Therapeutic exercise;Balance training;Neuromuscular re-education;Patient/family education;Other (comment)  prosthetic training   PT Next Visit Plan Assess wounds and new socket fit if pt has it. Continue  community with [facility] and household without a device, gait and balance activities. Functional household chores, lifting and carrying objects for higher level balance.    Consulted and Agree with Plan of Care Patient        Problem List Patient Active Problem List   Diagnosis Date Noted  . Gastroparesis 11/02/2014  . Diabetes mellitus due to underlying condition with other specified complication   . Diabetic foot   . Nausea & vomiting   . Essential hypertension 10/25/2014  . GERD (gastroesophageal reflux disease) 10/25/2014  . Cellulitis 10/24/2014  . Diabetes 10/24/2014  . Charcot foot due to diabetes mellitus 10/24/2014    Willow Ora 05/08/2015, 10:22 AM  Willow Ora, PTA, [place] [address] [place], [place] [address] [phone] 05/08/2015, 10:22 AM

## 2015-05-14 ENCOUNTER — Encounter: Payer: Self-pay | Admitting: Physical Therapy

## 2015-05-14 ENCOUNTER — Ambulatory Visit: Payer: Commercial Managed Care - HMO | Admitting: Physical Therapy

## 2015-05-14 DIAGNOSIS — R269 Unspecified abnormalities of gait and mobility: Secondary | ICD-10-CM

## 2015-05-14 DIAGNOSIS — R2689 Other abnormalities of gait and mobility: Secondary | ICD-10-CM

## 2015-05-14 DIAGNOSIS — Z4781 Encounter for orthopedic aftercare following surgical amputation: Secondary | ICD-10-CM | POA: Diagnosis not present

## 2015-05-14 DIAGNOSIS — Z7409 Other reduced mobility: Secondary | ICD-10-CM

## 2015-05-14 DIAGNOSIS — Z89611 Acquired absence of right leg above knee: Secondary | ICD-10-CM | POA: Diagnosis not present

## 2015-05-14 DIAGNOSIS — Z89612 Acquired absence of left leg above knee: Secondary | ICD-10-CM | POA: Diagnosis not present

## 2015-05-14 NOTE — Therapy (Signed)
[facility] [address] [place], [place], [address] Phone: [phone]   Fax:  [phone]  Physical Therapy Treatment  Patient Details  Name: Diana Coleman MRN: [id number] Date of Birth: [birth year] Referring Provider:  Lucianne Lei, MD  Encounter Date: 05/14/2015      PT End of Session - 05/14/15 0803    Visit Number 22   Number of Visits 23   Date for PT Re-Evaluation 05/11/15   PT Start Time 0801   PT Stop Time 0843   PT Time Calculation (min) 42 min   Equipment Utilized During Treatment Gait belt   Activity Tolerance Patient tolerated treatment well   Behavior During Therapy [facility] for tasks assessed/performed      Past Medical History  Diagnosis Date  . Type II diabetes mellitus   . Charcot foot due to diabetes mellitus   . Cellulitis of foot, left 10/24/2014    hx/notes 10/24/2014  . Osteomyelitis of right foot     hx/notes 10/24/2014    Past Surgical History  Procedure Laterality Date  . Above knee leg amputation Right 2004  . Tubal ligation  1973  . Reduction mammaplasty Bilateral 1987  . Foot surgery Left 1980's    "ulcer removed"  . Toe amputation Left ~ 2011    "top of my 3rd toe"  . Amputation Left 10/29/2014    Procedure: AMPUTATION BELOW KNEE - LEFT;  Surgeon: Newt Minion, MD;  Location: [facility];  Service: Orthopedics;  Laterality: Left;    There were no vitals filed for this visit.  Visit Diagnosis:  Abnormality of gait  Impaired functional mobility and activity tolerance  Balance problems      Subjective Assessment - 05/14/15 0803    Subjective No new complaints. No falls or pain to report.   Currently in Pain? No/denies   Pain Score 0-No pain           [facility] Adult PT Treatment/Exercise - 05/14/15 0804    Transfers   Sit to Stand 5: Supervision;With upper extremity assist;From chair/3-in-1   Stand to Sit 5: Supervision;With upper extremity assist;To chair/3-in-1   Ambulation/Gait   Ambulation/Gait Yes   Ambulation/Gait Assistance 5: Supervision   Ambulation/Gait Assistance Details cues on posture and equal step length   Ambulation Distance (Feet) 660 Feet   Assistive device Straight cane;Prostheses  with rubber tip   Gait Pattern Step-through pattern;Wide base of support;Decreased trunk rotation;Left flexed knee in stance   Ambulation Surface Level;Indoor   Stairs Yes   Stairs Assistance 5: Supervision   Stairs Assistance Details (indicate cue type and reason) cues on hand advancement and foot placement with descending stairs   Stair Management Technique One rail Right;One rail Left;Alternating pattern;Forwards;With cane   Number of Stairs 4  x 2 reps   Ramp 4: Min assist  with cane/prostheses   Ramp Details (indicate cue type and reason) cues on posture and to decrease step length   Curb 4: Min assist  with cane/prostheses   Curb Details (indicate cue type and reason) cues on posture and to slow down for safety   Dynamic Standing Balance   Dynamic Standing - Balance Support No upper extremity supported;During functional activity   Dynamic Standing - Level of Assistance 4: Min assist   Dynamic Standing - Balance Activities Compliant surfaces;Foam balance beam;Eyes open;Eyes closed;Head turns;Head nods;Alternating  foot traps   Dynamic Standing - Comments on air ex: feet apart eyes closed no head movement, feet close  eyes open head nods/shakes. standing with feet across foam beam: alternating forward foot taps and backward foot taps                         High Level Balance   High Level Balance Activities Side stepping;Marching forwards;Marching backwards;Tandem walking  tandem fwd/bwd   High Level Balance Comments in parallel bars: x 3 laps each/each way with light UE assist for balance, min guard assist for balance.   Knee/Hip Exercises: Aerobic   Stationary Bike Nustep x 4 extremities level 4.0 x 8 minutes with goal >/= 50 steps per minute  for strengthening and activity tolerance   Prosthetics   Prosthetic Care Comments  Has new socket for left leg. blisters appear to be healing.   Current prosthetic wear tolerance (days/week)  7 days   Current prosthetic wear tolerance (#hours/day)  right all awake hours. left prosthesis only as needed for activity until blisters heal   Residual limb condition  right limb intact; left limb: lateral blister covered with Tegaderm, appears to be healing. Newer small blister on medial aspect of incision line also appears to be healing.Both covered with Tegaderm.              Education Provided Residual limb care;Correct ply sock adjustment;Proper wear schedule/adjustment   Person(s) Educated Patient   Education Method Explanation;Verbal cues   Education Method Verbalized understanding;Verbal cues required   Donning Prosthesis Modified independent (device/increased time)   Doffing Prosthesis Modified independent (device/increased time)           PT Short Term Goals - 03/19/15 1302    PT SHORT TERM GOAL #1   Title demonstrates proper donning of prostheses (Target Date: 03/16/15)   Time 4   Period Weeks   Status Achieved   PT SHORT TERM GOAL #2   Title tolerates wear of bilateral prostheses >90% of awake hours with no skin issues and discomfort <2/10 (Target Date: 03/16/15)   Baseline 03/19/15- not met for left leg, met for right leg   Time 4   Period Weeks   Status Not Met   PT SHORT TERM GOAL #3   Title ambulates 200' with [facility] & prostheses modified independent. (Target Date: 03/16/15)   Time 4   Period Weeks   Status Achieved   PT SHORT TERM GOAL #4   Title negotiates ramp & curb with [facility] & prostheses with supervision. (Target Date: 03/16/15)   Baseline 03/19/15-still needed assist for balance at times   Time 4   Period Weeks   Status Not Met   PT SHORT TERM GOAL #5   Title manages clothes including pants for toileting and jacket in standing safely modified independent. (Target Date:  03/16/15)   Time 4   Period Weeks   Status Achieved           PT Long Term Goals - 05/01/15 0912    PT LONG TERM GOAL #1   Title independent with prosthetic care. (NEW Target Date: 05/11/15)   Baseline 04/10/15 Partially MET Patient needs minimal cues to adjust ply socks.    Time 8   Period Weeks   Status Partially Met   PT LONG TERM GOAL #2   Title wears bilateral prostheses >90% of awake hours with no skin issues or pain.  (NEW Target Date: 05/11/15)   Baseline 04/10/15 Partially MET Patient has 2 new bruises on residual limb and small healing blister. Patient wearing prostheses >90% of awake hrs.  Time 8   Period Weeks   Status Partially Met   PT LONG TERM GOAL #3   Title ambulates 100' with bilateral prostheses carrying plate & cup modified independent.  (NEW Target Date: 05/11/15)   Baseline 04/10/15 Partially MET Patient ambulates 33' with prostheses only no device with min guard.    Time 8   Period Weeks   Status Partially Met   PT LONG TERM GOAL #4   Title ambulates 300' with LRAD & bilateral prostheses modified independent.  (NEW Target Date: 05/11/15)   Baseline Partially MET 04/10/15 Patient ambulates >300' with single point cane & prostheses with supervision.    Time 8   Period Weeks   Status Partially Met   PT LONG TERM GOAL #5   Title negotiates ramp, curb & stairs with LRAD & bilateral prostheses modified independent.  (NEW Target Date: 05/11/15)   Baseline 04/10/15 Partially MET Patient neg ramp & curb with cane / prostheses with supervision.    Time 8   Period Weeks   Status Partially Met   PT LONG TERM GOAL #6   Title Timed Up & Go with prostheses only <13.5 seconds  (NEW Target Date: 05/11/15)   Baseline NOT RETESTED   Time 8   Period Weeks   Status On-going   PT LONG TERM GOAL #7   Title Berg Balance >30/56  (NEW Target Date: 05/11/15)   Baseline on 05/01/15: scored 44/56   Time 8   Period Weeks   Status Achieved           Plan - 05/14/15 0803     Clinical Impression Statement Pt making steady progress toward goals.   Pt will benefit from skilled therapeutic intervention in order to improve on the following deficits Abnormal gait;Decreased activity tolerance;Decreased balance;Decreased endurance;Decreased knowledge of use of DME;Decreased mobility;Decreased range of motion;Decreased strength;Other (comment)  prosthetic dependency   Rehab Potential Good   PT Frequency 2x / week   PT Duration 8 weeks   PT Treatment/Interventions ADLs/Self Care Home Management;DME Instruction;Gait training;Stair training;Functional mobility training;Therapeutic activities;Therapeutic exercise;Balance training;Neuromuscular re-education;Patient/family education;Other (comment)  prosthetic training   PT Next Visit Plan Assess goals, schedule more visits if continuing PT past this week.   Consulted and Agree with Plan of Care Patient        Problem List Patient Active Problem List   Diagnosis Date Noted  . Gastroparesis 11/02/2014  . Diabetes mellitus due to underlying condition with other specified complication   . Diabetic foot   . Nausea & vomiting   . Essential hypertension 10/25/2014  . GERD (gastroesophageal reflux disease) 10/25/2014  . Cellulitis 10/24/2014  . Diabetes 10/24/2014  . Charcot foot due to diabetes mellitus 10/24/2014    Willow Ora 05/14/2015, 12:04 PM  Willow Ora, PTA, [facility] [address] [place], [place] [address] [phone] 05/14/2015, 12:04 PM

## 2015-05-15 ENCOUNTER — Ambulatory Visit: Payer: Commercial Managed Care - HMO | Admitting: Physical Therapy

## 2015-05-15 ENCOUNTER — Encounter: Payer: Self-pay | Admitting: Physical Therapy

## 2015-05-15 DIAGNOSIS — R269 Unspecified abnormalities of gait and mobility: Secondary | ICD-10-CM

## 2015-05-15 DIAGNOSIS — Z89512 Acquired absence of left leg below knee: Secondary | ICD-10-CM

## 2015-05-15 DIAGNOSIS — L97424 Non-pressure chronic ulcer of left heel and midfoot with necrosis of bone: Secondary | ICD-10-CM | POA: Diagnosis not present

## 2015-05-15 DIAGNOSIS — Z4781 Encounter for orthopedic aftercare following surgical amputation: Secondary | ICD-10-CM | POA: Diagnosis not present

## 2015-05-15 DIAGNOSIS — Z89511 Acquired absence of right leg below knee: Secondary | ICD-10-CM

## 2015-05-15 DIAGNOSIS — Z7409 Other reduced mobility: Secondary | ICD-10-CM

## 2015-05-15 DIAGNOSIS — R2689 Other abnormalities of gait and mobility: Secondary | ICD-10-CM

## 2015-05-15 DIAGNOSIS — Z89612 Acquired absence of left leg above knee: Secondary | ICD-10-CM | POA: Diagnosis not present

## 2015-05-15 DIAGNOSIS — E1142 Type 2 diabetes mellitus with diabetic polyneuropathy: Secondary | ICD-10-CM | POA: Diagnosis not present

## 2015-05-15 DIAGNOSIS — M86272 Subacute osteomyelitis, left ankle and foot: Secondary | ICD-10-CM | POA: Diagnosis not present

## 2015-05-15 DIAGNOSIS — Z89611 Acquired absence of right leg above knee: Secondary | ICD-10-CM | POA: Diagnosis not present

## 2015-05-15 NOTE — Therapy (Signed)
[facility] [address] [place], [place], [address] Phone: [phone]   Fax:  [phone]  Physical Therapy Treatment  Patient Details  Name: Diana Coleman MRN: [id number] Date of Birth: March 07, 1951 Referring Provider:  Lucianne Lei, MD  Encounter Date: 05/15/2015      PT End of Session - 05/15/15 0802    Visit Number 23   Number of Visits 31   Date for PT Re-Evaluation 07/11/15   PT Start Time 0802   PT Stop Time 9563   PT Time Calculation (min) 45 min   Equipment Utilized During Treatment Gait belt   Activity Tolerance Patient tolerated treatment well   Behavior During Therapy [facility] for tasks assessed/performed      Past Medical History  Diagnosis Date  . Type II diabetes mellitus   . Charcot foot due to diabetes mellitus   . Cellulitis of foot, left 10/24/2014    hx/notes 10/24/2014  . Osteomyelitis of right foot     hx/notes 10/24/2014    Past Surgical History  Procedure Laterality Date  . Above knee leg amputation Right 2004  . Tubal ligation  1973  . Reduction mammaplasty Bilateral 1987  . Foot surgery Left 1980's    "ulcer removed"  . Toe amputation Left ~ 2011    "top of my 3rd toe"  . Amputation Left 10/29/2014    Procedure: AMPUTATION BELOW KNEE - LEFT;  Surgeon: Newt Minion, MD;  [facility];  Service: Orthopedics;  Laterality: Left;    There were no vitals filed for this visit.  Visit Diagnosis:  Abnormality of gait  Impaired functional mobility and activity tolerance  Balance problems  Status post bilateral below knee amputation      Subjective Assessment - 05/15/15 0851    Subjective No falls or complaints at this time. Seeing the surgeon this afternoon. Wound is still present on distal L residual limb but is continuing to heal.    Currently in Pain? No/denies                         [facility] Adult PT Treatment/Exercise - 05/15/15 0802    Transfers   Sit to  Stand 6: Modified independent (Device/Increase time);With upper extremity assist;From chair/3-in-1   Stand to Sit 6: Modified independent (Device/Increase time);With upper extremity assist;To chair/3-in-1   Ambulation/Gait   Ambulation/Gait Yes   Ambulation/Gait Assistance 6: Modified independent (Device/Increase time)   Ambulation/Gait Assistance Details no cues needed for safe gait this date   Ambulation Distance (Feet) 300 Feet  100' x1 no AD   Assistive device Straight cane;Prostheses  modified tip   Gait Pattern Step-through pattern;Wide base of support;Decreased trunk rotation   Ambulation Surface Level;Indoor   Stairs Yes   Stairs Assistance 6: Modified independent (Device/Increase time);5: Supervision  mod I with 1 HR. Supervision for reciprocal   Stairs Assistance Details (indicate cue type and reason) Cues for reciprocal pattern on stairs   Stair Management Technique One rail Right;One rail Left;Alternating pattern;Forwards;With cane   Number of Stairs 4   Ramp 6: Modified independent (Device)   Ramp Details (indicate cue type and reason) --  no cues needed for safe negotiation this date   Curb 6: Modified independent (Device/increase time)   Curb Details (indicate cue type and reason) --  no cues needed for safe negotiation this date   Berg Balance Test   Sit to Stand Able to stand without using hands  and stabilize independently   Standing Unsupported Able to stand safely 2 minutes   Sitting with Back Unsupported but Feet Supported on Floor or Stool Able to sit safely and securely 2 minutes   Stand to Sit Sits safely with minimal use of hands   Transfers Able to transfer safely, minor use of hands   Standing Unsupported with Eyes Closed Able to stand 10 seconds safely   Standing Ubsupported with Feet Together Able to place feet together independently and stand 1 minute safely   From Standing, Reach Forward with Outstretched Arm Can reach confidently >25 cm (10")   From  Standing Position, Pick up Object from Floor Able to pick up shoe safely and easily   From Standing Position, Turn to Look Behind Over each Shoulder Looks behind from both sides and weight shifts well   Turn 360 Degrees Able to turn 360 degrees safely in 4 seconds or less   Standing Unsupported, Alternately Place Feet on Step/Stool Able to complete 4 steps without aid or supervision   Standing Unsupported, One Foot in Front Able to plae foot ahead of the other independently and hold 30 seconds   Standing on One Leg Tries to lift leg/unable to hold 3 seconds but remains standing independently   Total Score 50   Timed Up and Go Test   TUG Normal TUG   Normal TUG (seconds) 11.41  sec with SPC, 11.67 sec without [facility]   Prosthetics   Prosthetic Care Comments  Blisters continuing to heal. Call prosthetist about alignment of L prosthesis.   Current prosthetic wear tolerance (days/week)  7 days   Current prosthetic wear tolerance (#hours/day)  right all awake hours. left prosthesis only as needed for activity until blisters heal  as advised by PT   Residual limb condition  right limb intact; left limb: lateral blister covered with Tegaderm, appears to be healing. Newer small blister on medial aspect of incision line also appears to be healing.Both covered with Tegaderm.              Education Provided Residual limb care;Proper wear schedule/adjustment   Person(s) Educated Patient   Education Method Explanation;Verbal cues   Education Method Verbalized understanding   Donning Prosthesis Independent   Doffing Prosthesis Independent                PT Education - 05/15/15 0802    Education provided Yes   Education Details Continued to instruct on wound care and monitoring any color, size changes. PT demonstrated hip IR stretch in supine for patient to perform at home.    Person(s) Educated Patient   Methods Explanation;Demonstration;Verbal cues   Comprehension Verbalized  understanding;Verbal cues required          PT Short Term Goals - 05/15/15 1411    PT SHORT TERM GOAL #1   Title demonstrates proper donning of prostheses (Target Date: 03/16/15)   Time 4   Period Weeks   Status Achieved   PT SHORT TERM GOAL #2   Title tolerates wear of bilateral prostheses >90% of awake hours with no skin issues and discomfort <2/10 (Target Date: 03/16/15)   Baseline 03/19/15- not met for left leg, met for right leg   Time 4   Period Weeks   Status Not Met   PT SHORT TERM GOAL #3   Title ambulates 200' with [facility] & prostheses modified independent. (Target Date: 03/16/15)   Time 4   Period Weeks   Status Achieved   PT  SHORT TERM GOAL #4   Title negotiates ramp & curb with [facility] & prostheses with supervision.    Baseline MET 05/15/15   Time 4   Period Weeks   Status Achieved   PT SHORT TERM GOAL #5   Title manages clothes including pants for toileting and jacket in standing safely modified independent. (Target Date: 03/16/15)   Time 4   Period Weeks   Status Achieved   Additional Short Term Goals   Additional Short Term Goals Yes   PT SHORT TERM GOAL #6   Title ambulates 150' with prostheses only, no assistive device for household ambulation modified independent. (Target Date: 06/12/15)   Time 4   Period Weeks   Status New   PT SHORT TERM GOAL #7   Title tolerates 30 minutes of standing activities with no seated rest breaks in order to cook/prepare entire meal modified independent. (Target Date: 06/12/15)   Time 4   Period Weeks   Status New           PT Long Term Goals - 05/15/15 0802    PT LONG TERM GOAL #1   Title independent with prosthetic care.    Baseline MET 05/15/15)   Time 8   Period Weeks   Status Achieved   PT LONG TERM GOAL #2   Title wears bilateral prostheses >90% of awake hours with no skin issues or pain.  (NEW Target Date: 07/09/15)   Baseline 05/15/15 Partially MET Patient has a small wound on L residual limb that is continuing to  heal. Patient wearing prostheses >90% of awake hrs.   Time 8   Period Weeks   Status Partially Met   PT LONG TERM GOAL #3   Title ambulates 100' with bilateral prostheses carrying plate & cup modified independent.     Baseline (MET 05/15/15)   Time 8   Period Weeks   Status Achieved   PT LONG TERM GOAL #4   Title ambulates 300' with LRAD & bilateral prostheses modified independent.     Baseline (MET 05/15/15)   Time 8   Period Weeks   Status Achieved   PT LONG TERM GOAL #5   Title negotiates ramp, curb & stairs (1 rail reciprocal) with only bilateral prostheses modified independent. (NEW Target Date: 07/09/15)   Baseline MET 05/15/15 with single point cane, updated    Time 8   Period Weeks   Status Revised   PT LONG TERM GOAL #6   Title Timed Up & Go with prostheses only <13.5 seconds     Baseline MET 05/15/15 11.67 sec without AD, 11.41 sec with SPC.   Time 8   Period Weeks   Status Achieved   PT LONG TERM GOAL #7   Title Berg Balance >30/56     Baseline MET 05/15/15 scored 50/56   Time 8   Period Weeks   Status Achieved               Plan - 05/15/15 0802    Clinical Impression Statement Pt met 5/7 long term goals. Still needs skilled care, PT will continue to see patient 1 time a week to monitor wound and prosthetic wear. Progress towards basic community ambulation without an assistive device. Pt showed great improvement with overall balance and endurance during functional activities.    Pt will benefit from skilled therapeutic intervention in order to improve on the following deficits Abnormal gait;Decreased activity tolerance;Decreased balance;Decreased endurance;Decreased knowledge of use of DME;Decreased mobility;Decreased range of motion;Decreased  strength;Other (comment)  prosthetic dependency   Rehab Potential Good   PT Frequency 1x / week   PT Duration 8 weeks   PT Treatment/Interventions ADLs/Self Care Home Management;DME Instruction;Gait training;Stair  training;Functional mobility training;Therapeutic activities;Therapeutic exercise;Balance training;Neuromuscular re-education;Patient/family education;Other (comment)  prosthetic training   PT Next Visit Plan Monitor wound on L residual limb, negotiating ramp and curb without an assistive device, completing stairs reciprocally, perform DGI due to patient complaing of fear of ambulating in crowded areas.    Consulted and Agree with Plan of Care Patient        Problem List Patient Active Problem List   Diagnosis Date Noted  . Gastroparesis 11/02/2014  . Diabetes mellitus due to underlying condition with other specified complication   . Diabetic foot   . Nausea & vomiting   . Essential hypertension 10/25/2014  . GERD (gastroesophageal reflux disease) 10/25/2014  . Cellulitis 10/24/2014  . Diabetes 10/24/2014  . Charcot foot due to diabetes mellitus 10/24/2014   Jamey Reas, PT, DPT PT Specializing in [place] 05/15/2015 5:10 PM Phone:  [phone]  Fax:  [phone] [facility] [address] [place], [place] [address] Flora Ratz [place] Nemiah [place], [place] 05/15/2015, 5:09 PM  [facility] [address] [place], [place], [address] Phone: [phone]   Fax:  [phone]

## 2015-05-23 ENCOUNTER — Encounter: Payer: Self-pay | Admitting: Physical Therapy

## 2015-05-23 ENCOUNTER — Ambulatory Visit: Payer: Commercial Managed Care - HMO | Attending: Orthopedic Surgery | Admitting: Physical Therapy

## 2015-05-23 DIAGNOSIS — Z89511 Acquired absence of right leg below knee: Secondary | ICD-10-CM | POA: Diagnosis not present

## 2015-05-23 DIAGNOSIS — R269 Unspecified abnormalities of gait and mobility: Secondary | ICD-10-CM | POA: Diagnosis not present

## 2015-05-23 DIAGNOSIS — R29818 Other symptoms and signs involving the nervous system: Secondary | ICD-10-CM | POA: Insufficient documentation

## 2015-05-23 DIAGNOSIS — Z89512 Acquired absence of left leg below knee: Secondary | ICD-10-CM | POA: Diagnosis not present

## 2015-05-23 DIAGNOSIS — Z7409 Other reduced mobility: Secondary | ICD-10-CM | POA: Diagnosis not present

## 2015-05-23 DIAGNOSIS — R2689 Other abnormalities of gait and mobility: Secondary | ICD-10-CM

## 2015-05-23 NOTE — Therapy (Signed)
[facility] [address] [place], [place], [address] Phone: [phone]   Fax:  [phone]  Physical Therapy Treatment  Patient Details  Name: Diana Coleman MRN: [id number] Date of Birth: 1951/12/14 Referring Provider:  Lucianne Lei, MD  Encounter Date: 05/23/2015      PT End of Session - 05/23/15 0854    Visit Number 24   Number of Visits 31   Date for PT Re-Evaluation 07/11/15   PT Start Time 0846   PT Stop Time 0930   PT Time Calculation (min) 44 min   Equipment Utilized During Treatment Gait belt   Activity Tolerance Patient tolerated treatment well   Behavior During Therapy [facility] for tasks assessed/performed      Past Medical History  Diagnosis Date  . Type II diabetes mellitus   . Charcot foot due to diabetes mellitus   . Cellulitis of foot, left 10/24/2014    hx/notes 10/24/2014  . Osteomyelitis of right foot     hx/notes 10/24/2014    Past Surgical History  Procedure Laterality Date  . Above knee leg amputation Right 2004  . Tubal ligation  1973  . Reduction mammaplasty Bilateral 1987  . Foot surgery Left 1980's    "ulcer removed"  . Toe amputation Left ~ 2011    "top of my 3rd toe"  . Amputation Left 10/29/2014    Procedure: AMPUTATION BELOW KNEE - LEFT;  Surgeon: Newt Minion, MD;  Location: [facility];  Service: Orthopedics;  Laterality: Left;    There were no vitals filed for this visit.  Visit Diagnosis:  Abnormality of gait  Balance problems  Impaired functional mobility and activity tolerance      Subjective Assessment - 05/23/15 0852    Subjective No falls to report. Had her sister in town and was driving/running all over town. Had one day where the new prosthesis was hurting her and needed increased support for balance. Reports decreasing her sock ply helped the pain.;   Currently in Pain? No/denies   Pain Score 0-No pain          [facility] Adult PT Treatment/Exercise - 05/23/15 0856     Ambulation/Gait   Ambulation/Gait Yes   Ambulation/Gait Assistance 4: Min guard   Ambulation/Gait Assistance Details cues on posture and base of support with gait   Ambulation Distance (Feet) 230 Feet  x 2 reps   Assistive device Prostheses   Gait Pattern Step-through pattern;Wide base of support;Decreased trunk rotation   Ambulation Surface Level;Indoor   Dynamic Standing Balance   Dynamic Standing - Balance Support No upper extremity supported;During functional activity   Dynamic Standing - Level of Assistance 4: Min assist   Dynamic Standing - Balance Activities Rocker board;Foam balance beam;Head turns;Head nods;Eyes open;Alternating  foot traps   Dynamic Standing - Comments in parallel bars: standing with feet across red foam beam: alternating forward foot taps and backward toe taps x 10 each bil sides;both ways on balance board with eyes open: hold steady no head movements, hold steady with head nods/shakes                                   High Level Balance   High Level Balance Activities Side stepping;Tandem walking;Marching forwards;Marching backwards   High Level Balance Comments in parallel bars: x 3 laps each/each way with little to no UE assist for balance, min guard assist for balance.  Prosthetics   Prosthetic Care Comments  Saw Bobby (prosthetist) yesterday and he made changes to prosthetic alignment. Pt reports it feels better since he adjusted the foot position.                        Current prosthetic wear tolerance (days/week)  7 days   Current prosthetic wear tolerance (#hours/day)  right all awake hours. left prosthesis only as needed for activity until blisters heal  drying as needed    Residual limb condition  right limb intact; left limb: blister appears to be healing. Covered with Tegaderm.                                        Education Provided Residual limb care;Proper wear schedule/adjustment   Person(s) Educated Patient   Education Method Explanation    Education Method Verbalized understanding   Donning Prosthesis Independent   Doffing Prosthesis Independent            PT Short Term Goals - 05/15/15 1411    PT SHORT TERM GOAL #1   Title demonstrates proper donning of prostheses (Target Date: 03/16/15)   Time 4   Period Weeks   Status Achieved   PT SHORT TERM GOAL #2   Title tolerates wear of bilateral prostheses >90% of awake hours with no skin issues and discomfort <2/10 (Target Date: 03/16/15)   Baseline 03/19/15- not met for left leg, met for right leg   Time 4   Period Weeks   Status Not Met   PT SHORT TERM GOAL #3   Title ambulates 200' with [facility] & prostheses modified independent. (Target Date: 03/16/15)   Time 4   Period Weeks   Status Achieved   PT SHORT TERM GOAL #4   Title negotiates ramp & curb with [facility] & prostheses with supervision.    Baseline MET 05/15/15   Time 4   Period Weeks   Status Achieved   PT SHORT TERM GOAL #5   Title manages clothes including pants for toileting and jacket in standing safely modified independent. (Target Date: 03/16/15)   Time 4   Period Weeks   Status Achieved   Additional Short Term Goals   Additional Short Term Goals Yes   PT SHORT TERM GOAL #6   Title ambulates 150' with prostheses only, no assistive device for household ambulation modified independent. (Target Date: 06/12/15)   Time 4   Period Weeks   Status New   PT SHORT TERM GOAL #7   Title tolerates 30 minutes of standing activities with no seated rest breaks in order to cook/prepare entire meal modified independent. (Target Date: 06/12/15)   Time 4   Period Weeks   Status New           PT Long Term Goals - 05/15/15 0802    PT LONG TERM GOAL #1   Title independent with prosthetic care.    Baseline MET 05/15/15)   Time 8   Period Weeks   Status Achieved   PT LONG TERM GOAL #2   Title wears bilateral prostheses >90% of awake hours with no skin issues or pain.  (NEW Target Date: 07/09/15)   Baseline 05/15/15  Partially MET Patient has a small wound on L residual limb that is continuing to heal. Patient wearing prostheses >90% of awake hrs.   Time 8   Period  Weeks   Status Partially Met   PT LONG TERM GOAL #3   Title ambulates 100' with bilateral prostheses carrying plate & cup modified independent.     Baseline (MET 05/15/15)   Time 8   Period Weeks   Status Achieved   PT LONG TERM GOAL #4   Title ambulates 300' with LRAD & bilateral prostheses modified independent.     Baseline (MET 05/15/15)   Time 8   Period Weeks   Status Achieved   PT LONG TERM GOAL #5   Title negotiates ramp, curb & stairs (1 rail reciprocal) with only bilateral prostheses modified independent. (NEW Target Date: 07/09/15)   Baseline MET 05/15/15 with single point cane, updated    Time 8   Period Weeks   Status Revised   PT LONG TERM GOAL #6   Title Timed Up & Go with prostheses only <13.5 seconds     Baseline MET 05/15/15 11.67 sec without AD, 11.41 sec with SPC.   Time 8   Period Weeks   Status Achieved   PT LONG TERM GOAL #7   Title Berg Balance >30/56     Baseline MET 05/15/15 scored 50/56   Time 8   Period Weeks   Status Achieved           Plan - 05/23/15 0854    Clinical Impression Statement Pt making steady progress toward goals. Continues to be challenged with dynamic balance activities and balance on complaint surfaces.   Pt will benefit from skilled therapeutic intervention in order to improve on the following deficits Abnormal gait;Decreased activity tolerance;Decreased balance;Decreased endurance;Decreased knowledge of use of DME;Decreased mobility;Decreased range of motion;Decreased strength;Other (comment)  prosthetic dependency   Rehab Potential Good   PT Frequency 1x / week   PT Duration 8 weeks   PT Treatment/Interventions ADLs/Self Care Home Management;DME Instruction;Gait training;Stair training;Functional mobility training;Therapeutic activities;Therapeutic exercise;Balance  training;Neuromuscular re-education;Patient/family education;Other (comment)  prosthetic training   PT Next Visit Plan Monitor wound on L residual limb, negotiating ramp and curb without an assistive device, completing stairs reciprocally, perform DGI due to patient complaing of fear of ambulating in crowded areas.    Consulted and Agree with Plan of Care Patient        Problem List Patient Active Problem List   Diagnosis Date Noted  . Gastroparesis 11/02/2014  . Diabetes mellitus due to underlying condition with other specified complication   . Diabetic foot   . Nausea & vomiting   . Essential hypertension 10/25/2014  . GERD (gastroesophageal reflux disease) 10/25/2014  . Cellulitis 10/24/2014  . Diabetes 10/24/2014  . Charcot foot due to diabetes mellitus 10/24/2014    Willow Ora 05/23/2015, 2:31 PM  Willow Ora, PTA, [facility] [address] [place], [place] [address] [phone] 05/23/2015, 2:31 PM

## 2015-05-28 ENCOUNTER — Encounter: Payer: Self-pay | Admitting: Physical Therapy

## 2015-05-28 ENCOUNTER — Ambulatory Visit: Payer: Commercial Managed Care - HMO | Admitting: Physical Therapy

## 2015-05-28 DIAGNOSIS — R269 Unspecified abnormalities of gait and mobility: Secondary | ICD-10-CM

## 2015-05-28 DIAGNOSIS — Z7409 Other reduced mobility: Secondary | ICD-10-CM | POA: Diagnosis not present

## 2015-05-28 DIAGNOSIS — Z89511 Acquired absence of right leg below knee: Secondary | ICD-10-CM | POA: Diagnosis not present

## 2015-05-28 DIAGNOSIS — Z89512 Acquired absence of left leg below knee: Secondary | ICD-10-CM | POA: Diagnosis not present

## 2015-05-28 DIAGNOSIS — R2689 Other abnormalities of gait and mobility: Secondary | ICD-10-CM

## 2015-05-28 DIAGNOSIS — R29818 Other symptoms and signs involving the nervous system: Secondary | ICD-10-CM | POA: Diagnosis not present

## 2015-05-28 NOTE — Therapy (Signed)
[facility] [address] [place], [place], [address] Phone: [phone]   Fax:  [phone]  Physical Therapy Treatment  Patient Details  Name: Diana Coleman MRN: [id number] Date of Birth: 11-19-1951 Referring Provider:  Lucianne Lei, MD  Encounter Date: 05/28/2015    05/28/15 0849  PT Visits / Re-Eval  Visit Number 25  Number of Visits 31  Date for PT Re-Evaluation 07/11/15  PT Time Calculation  PT Start Time 0845  PT Stop Time 0930  PT Time Calculation (min) 45 min  PT - End of Session  Equipment Utilized During Treatment Gait belt  Activity Tolerance Patient tolerated treatment well  Behavior During Therapy [facility] for tasks assessed/performed     Past Medical History  Diagnosis Date  . Type II diabetes mellitus   . Charcot foot due to diabetes mellitus   . Cellulitis of foot, left 10/24/2014    hx/notes 10/24/2014  . Osteomyelitis of right foot     hx/notes 10/24/2014    Past Surgical History  Procedure Laterality Date  . Above knee leg amputation Right 2004  . Tubal ligation  1973  . Reduction mammaplasty Bilateral 1987  . Foot surgery Left 1980's    "ulcer removed"  . Toe amputation Left ~ 2011    "top of my 3rd toe"  . Amputation Left 10/29/2014    Procedure: AMPUTATION BELOW KNEE - LEFT;  Surgeon: Newt Minion, MD;  Location: [facility];  Service: Orthopedics;  Laterality: Left;    There were no vitals filed for this visit.  Visit Diagnosis:  No diagnosis found.      Subjective Assessment - 05/28/15 0849    Subjective No new complaints. No falls or pain to report.    Currently in Pain? No/denies   Pain Score 0-No pain           [facility] Adult PT Treatment/Exercise - 05/28/15 0850    Transfers   Sit to Stand 6: Modified independent (Device/Increase time);With upper extremity assist;From chair/3-in-1   Stand to Sit 6: Modified independent (Device/Increase time);With upper extremity assist;To  chair/3-in-1   Ambulation/Gait   Ambulation/Gait Yes   Ambulation/Gait Assistance 4: Min guard   Ambulation/Gait Assistance Details cues on posture, base of support, to decrease foot cross over with gait. Pt with veering noted today and increased episodes of scissoring with gait.                         Ambulation Distance (Feet) 200 Feet   Assistive device Prostheses   Gait Pattern Step-through pattern;Wide base of support;Decreased trunk rotation   Ambulation Surface Level;Indoor   Dynamic Standing Balance   Dynamic Standing - Balance Support No upper extremity supported;During functional activity   Dynamic Standing - Level of Assistance 4: Min assist   Dynamic Standing - Balance Activities Rocker board;Compliant surfaces;Eyes closed;Head turns;Head nods   Dynamic Standing - Comments in parallel bars: both ways on the board: eyes closed no head movement, eyes closed head nods/shakes with cues on posture and equal weight bearing for better balance.  on air ex with red theraband: shoulder extension, rows and alternating UE raises x 10 reps each bil arms with cues on posture and ex form.                                Dynamic Gait Index   Level Surface Mild Impairment  Change in Gait Speed Mild Impairment   Gait with Horizontal Head Turns Mild Impairment   Gait with Vertical Head Turns Moderate Impairment   Gait and Pivot Turn Mild Impairment   Step Over Obstacle Mild Impairment   Step Around Obstacles Normal   Steps Mild Impairment   Total Score 16   High Level Balance   High Level Balance Activities Marching forwards;Marching backwards;Tandem walking  tandem fwd/bwd;fwd cross overs   High Level Balance Comments in parallel bars: x 4-5 laps each/each way with little to no UE assist for balance, min guard assist for balance.   Knee/Hip Exercises: Aerobic   Stationary Bike Scifit x 4 extremities level 4.5 x 8 minutes with goal rpm >/= 50 for strengthening and activity tolerance            PT Short Term Goals - 05/15/15 1411    PT SHORT TERM GOAL #1   Title demonstrates proper donning of prostheses (Target Date: 03/16/15)   Time 4   Period Weeks   Status Achieved   PT SHORT TERM GOAL #2   Title tolerates wear of bilateral prostheses >90% of awake hours with no skin issues and discomfort <2/10 (Target Date: 03/16/15)   Baseline 03/19/15- not met for left leg, met for right leg   Time 4   Period Weeks   Status Not Met   PT SHORT TERM GOAL #3   Title ambulates 200' with [facility] & prostheses modified independent. (Target Date: 03/16/15)   Time 4   Period Weeks   Status Achieved   PT SHORT TERM GOAL #4   Title negotiates ramp & curb with [facility] & prostheses with supervision.    Baseline MET 05/15/15   Time 4   Period Weeks   Status Achieved   PT SHORT TERM GOAL #5   Title manages clothes including pants for toileting and jacket in standing safely modified independent. (Target Date: 03/16/15)   Time 4   Period Weeks   Status Achieved   Additional Short Term Goals   Additional Short Term Goals Yes   PT SHORT TERM GOAL #6   Title ambulates 150' with prostheses only, no assistive device for household ambulation modified independent. (Target Date: 06/12/15)   Time 4   Period Weeks   Status New   PT SHORT TERM GOAL #7   Title tolerates 30 minutes of standing activities with no seated rest breaks in order to cook/prepare entire meal modified independent. (Target Date: 06/12/15)   Time 4   Period Weeks   Status New           PT Long Term Goals - 05/15/15 0802    PT LONG TERM GOAL #1   Title independent with prosthetic care.    Baseline MET 05/15/15)   Time 8   Period Weeks   Status Achieved   PT LONG TERM GOAL #2   Title wears bilateral prostheses >90% of awake hours with no skin issues or pain.  (NEW Target Date: 07/09/15)   Baseline 05/15/15 Partially MET Patient has a small wound on L residual limb that is continuing to heal. Patient wearing prostheses >90%  of awake hrs.   Time 8   Period Weeks   Status Partially Met   PT LONG TERM GOAL #3   Title ambulates 100' with bilateral prostheses carrying plate & cup modified independent.     Baseline (MET 05/15/15)   Time 8   Period Weeks   Status Achieved   PT  LONG TERM GOAL #4   Title ambulates 300' with LRAD & bilateral prostheses modified independent.     Baseline (MET 05/15/15)   Time 8   Period Weeks   Status Achieved   PT LONG TERM GOAL #5   Title negotiates ramp, curb & stairs (1 rail reciprocal) with only bilateral prostheses modified independent. (NEW Target Date: 07/09/15)   Baseline MET 05/15/15 with single point cane, updated    Time 8   Period Weeks   Status Revised   PT LONG TERM GOAL #6   Title Timed Up & Go with prostheses only <13.5 seconds     Baseline MET 05/15/15 11.67 sec without AD, 11.41 sec with SPC.   Time 8   Period Weeks   Status Achieved   PT LONG TERM GOAL #7   Title Berg Balance >30/56     Baseline MET 05/15/15 scored 50/56   Time 8   Period Weeks   Status Achieved        05/28/15 0850  Plan  Clinical Impression Statement Pt continues to make steady progress toward goals. PT to set DGI goal after performing it today.  Pt will benefit from skilled therapeutic intervention in order to improve on the following deficits Abnormal gait;Decreased activity tolerance;Decreased balance;Decreased endurance;Decreased knowledge of use of DME;Decreased mobility;Decreased range of motion;Decreased strength;Other (comment) (prosthetic dependency)  Rehab Potential Good  PT Frequency 1x / week  PT Duration 8 weeks  PT Treatment/Interventions ADLs/Self Care Home Management;DME Instruction;Gait training;Stair training;Functional mobility training;Therapeutic activities;Therapeutic exercise;Balance training;Neuromuscular re-education;Patient/family education;Other (comment) (prosthetic training)  PT Next Visit Plan Monitor wound on L residual limb, negotiating ramp and  curb without an assistive device, completing stairs reciprocally,   Consulted and Agree with Plan of Care Patient     Problem List Patient Active Problem List   Diagnosis Date Noted  . Gastroparesis 11/02/2014  . Diabetes mellitus due to underlying condition with other specified complication   . Diabetic foot   . Nausea & vomiting   . Essential hypertension 10/25/2014  . GERD (gastroesophageal reflux disease) 10/25/2014  . Cellulitis 10/24/2014  . Diabetes 10/24/2014  . Charcot foot due to diabetes mellitus 10/24/2014    Willow Ora 05/28/2015, 9:21 AM  Willow Ora, PTA, [facility] [address] [place], [place] [address] [phone] 05/28/2015, 9:22 AM

## 2015-06-05 ENCOUNTER — Encounter: Payer: Self-pay | Admitting: Physical Therapy

## 2015-06-05 ENCOUNTER — Ambulatory Visit: Payer: Commercial Managed Care - HMO | Admitting: Physical Therapy

## 2015-06-05 DIAGNOSIS — Z89511 Acquired absence of right leg below knee: Secondary | ICD-10-CM | POA: Diagnosis not present

## 2015-06-05 DIAGNOSIS — Z7409 Other reduced mobility: Secondary | ICD-10-CM

## 2015-06-05 DIAGNOSIS — E0959 Drug or chemical induced diabetes mellitus with other circulatory complications: Secondary | ICD-10-CM | POA: Diagnosis not present

## 2015-06-05 DIAGNOSIS — E1140 Type 2 diabetes mellitus with diabetic neuropathy, unspecified: Secondary | ICD-10-CM | POA: Diagnosis not present

## 2015-06-05 DIAGNOSIS — R2689 Other abnormalities of gait and mobility: Secondary | ICD-10-CM

## 2015-06-05 DIAGNOSIS — Z89512 Acquired absence of left leg below knee: Secondary | ICD-10-CM | POA: Diagnosis not present

## 2015-06-05 DIAGNOSIS — R29818 Other symptoms and signs involving the nervous system: Secondary | ICD-10-CM | POA: Diagnosis not present

## 2015-06-05 DIAGNOSIS — R269 Unspecified abnormalities of gait and mobility: Secondary | ICD-10-CM

## 2015-06-05 NOTE — Therapy (Signed)
[facility] [address] [place], [place], [address] Phone: [phone]   Fax:  [phone]  Physical Therapy Treatment  Patient Details  Name: Diana Coleman MRN: [id number] Date of Birth: [birth year] Referring Provider:  Lucianne Lei, MD  Encounter Date: 06/05/2015    06/05/15 0852  PT Visits / Re-Eval  Visit Number 26  Number of Visits 31  Date for PT Re-Evaluation 07/11/15  PT Time Calculation  PT Start Time 0850  PT Stop Time 0928  PT Time Calculation (min) 38 min  PT - End of Session  Equipment Utilized During Treatment Gait belt  Activity Tolerance Patient tolerated treatment well  Behavior During Therapy [facility] for tasks assessed/performed     Past Medical History  Diagnosis Date  . Type II diabetes mellitus   . Charcot foot due to diabetes mellitus   . Cellulitis of foot, left 10/24/2014    hx/notes 10/24/2014  . Osteomyelitis of right foot     hx/notes 10/24/2014    Past Surgical History  Procedure Laterality Date  . Above knee leg amputation Right 2004  . Tubal ligation  1973  . Reduction mammaplasty Bilateral 1987  . Foot surgery Left 1980's    "ulcer removed"  . Toe amputation Left ~ 2011    "top of my 3rd toe"  . Amputation Left 10/29/2014    Procedure: AMPUTATION BELOW KNEE - LEFT;  Surgeon: Newt Minion, MD;  Location: [facility];  Service: Orthopedics;  Laterality: Left;    There were no vitals filed for this visit.  Visit Diagnosis:  Abnormality of gait  Balance problems  Impaired functional mobility and activity tolerance     06/05/15 0851  Symptoms/Limitations  Subjective No new complaints. No falls or pain to report.   Pain Assessment  Currently in Pain? No/denies      06/05/15 0852  Transfers  Sit to Stand 6: Modified independent (Device/Increase time);With upper extremity assist;From chair/3-in-1  Stand to Sit 6: Modified independent (Device/Increase time);With upper  extremity assist;To chair/3-in-1  Ambulation/Gait  Ambulation/Gait Yes  Ambulation/Gait Assistance 4: Min guard  Ambulation/Gait Assistance Details cues on equal step length and increased trunk movements  Ambulation Distance (Feet) 420 Feet  Assistive device Prostheses  Gait Pattern Step-through pattern;Wide base of support;Decreased trunk rotation  Ambulation Surface Level;Indoor  Stairs Yes  Stairs Assistance 5: Supervision;6: Modified independent (Device/Increase time)  Stairs Assistance Details (indicate cue type and reason) occasional cues on foot placement with descending stairs on first 2 reps, no cues needed on 3rd rep  Stair Management Technique One rail Right;One rail Left;Alternating pattern;Forwards;With cane  Number of Stairs 4 (x 3 reps)  Dynamic Standing Balance  Dynamic Standing - Balance Support No upper extremity supported;During functional activity (occasional touch to bars for balance)  Dynamic Standing - Level of Assistance 4: Min assist  Dynamic Standing - Balance Activities Rocker board;Foam balance beam  Dynamic Standing - Comments both ways on balance board and with feet across the foam beam: eyes closed no head movements, eyes open with head nods/shakes                      High Level Balance  High Level Balance Activities Backward walking;Marching forwards;Side stepping  High Level Balance Comments in parallel bars: x 4-5 laps each/each way with little to no UE assist for balance, min guard assist for balance.  Prosthetics  Current prosthetic wear tolerance (days/week)  7 days  Current prosthetic wear  tolerance (#hours/day)  all awake hours for both prostheses  Residual limb condition  healing with scab over wound  Education Provided Residual limb care  Person(s) Educated Patient  Education Method Explanation  Education Method Verbalized understanding  Donning Prosthesis 7  Doffing Prosthesis 7         PT Short Term Goals - 05/15/15 1411    PT SHORT  TERM GOAL #1   Title demonstrates proper donning of prostheses (Target Date: 03/16/15)   Time 4   Period Weeks   Status Achieved   PT SHORT TERM GOAL #2   Title tolerates wear of bilateral prostheses >90% of awake hours with no skin issues and discomfort <2/10 (Target Date: 03/16/15)   Baseline 03/19/15- not met for left leg, met for right leg   Time 4   Period Weeks   Status Not Met   PT SHORT TERM GOAL #3   Title ambulates 200' with [facility] & prostheses modified independent. (Target Date: 03/16/15)   Time 4   Period Weeks   Status Achieved   PT SHORT TERM GOAL #4   Title negotiates ramp & curb with [facility] & prostheses with supervision.    Baseline MET 05/15/15   Time 4   Period Weeks   Status Achieved   PT SHORT TERM GOAL #5   Title manages clothes including pants for toileting and jacket in standing safely modified independent. (Target Date: 03/16/15)   Time 4   Period Weeks   Status Achieved   Additional Short Term Goals   Additional Short Term Goals Yes   PT SHORT TERM GOAL #6   Title ambulates 150' with prostheses only, no assistive device for household ambulation modified independent. (Target Date: 06/12/15)   Time 4   Period Weeks   Status New   PT SHORT TERM GOAL #7   Title tolerates 30 minutes of standing activities with no seated rest breaks in order to cook/prepare entire meal modified independent. (Target Date: 06/12/15)   Time 4   Period Weeks   Status New           PT Long Term Goals - 05/15/15 0802    PT LONG TERM GOAL #1   Title independent with prosthetic care.    Baseline MET 05/15/15)   Time 8   Period Weeks   Status Achieved   PT LONG TERM GOAL #2   Title wears bilateral prostheses >90% of awake hours with no skin issues or pain.  (NEW Target Date: 07/09/15)   Baseline 05/15/15 Partially MET Patient has a small wound on L residual limb that is continuing to heal. Patient wearing prostheses >90% of awake hrs.   Time 8   Period Weeks   Status Partially  Met   PT LONG TERM GOAL #3   Title ambulates 100' with bilateral prostheses carrying plate & cup modified independent.     Baseline (MET 05/15/15)   Time 8   Period Weeks   Status Achieved   PT LONG TERM GOAL #4   Title ambulates 300' with LRAD & bilateral prostheses modified independent.     Baseline (MET 05/15/15)   Time 8   Period Weeks   Status Achieved   PT LONG TERM GOAL #5   Title negotiates ramp, curb & stairs (1 rail reciprocal) with only bilateral prostheses modified independent. (NEW Target Date: 07/09/15)   Baseline MET 05/15/15 with single point cane, updated    Time 8   Period Weeks   Status Revised  PT LONG TERM GOAL #6   Title Timed Up & Go with prostheses only <13.5 seconds     Baseline MET 05/15/15 11.67 sec without AD, 11.41 sec with SPC.   Time 8   Period Weeks   Status Achieved   PT LONG TERM GOAL #7   Title Berg Balance >30/56     Baseline MET 05/15/15 scored 50/56   Time 8   Period Weeks   Status Achieved        06/05/15 8845  Plan  Clinical Impression Statement Pt's wounds are healed and she is progressing very well towards goals.   Pt will benefit from skilled therapeutic intervention in order to improve on the following deficits Abnormal gait;Decreased activity tolerance;Decreased balance;Decreased endurance;Decreased knowledge of use of DME;Decreased mobility;Decreased range of motion;Decreased strength;Other (comment) (prosthetic dependency)  Rehab Potential Good  PT Frequency 1x / week  PT Duration 8 weeks  PT Treatment/Interventions ADLs/Self Care Home Management;DME Instruction;Gait training;Stair training;Functional mobility training;Therapeutic activities;Therapeutic exercise;Balance training;Neuromuscular re-education;Patient/family education;Other (comment) (prosthetic training)  PT Next Visit Plan assess goals, discharge early if pt has met goals.  Consulted and Agree with Plan of Care Patient     Problem List Patient Active  Problem List   Diagnosis Date Noted  . Gastroparesis 11/02/2014  . Diabetes mellitus due to underlying condition with other specified complication   . Diabetic foot   . Nausea & vomiting   . Essential hypertension 10/25/2014  . GERD (gastroesophageal reflux disease) 10/25/2014  . Cellulitis 10/24/2014  . Diabetes 10/24/2014  . Charcot foot due to diabetes mellitus 10/24/2014    Willow Ora 06/06/2015, 6:27 PM  Willow Ora, PTA, [facility] [address] [place],  [address] [phone] 06/06/2015, 6:27 PM

## 2015-06-12 ENCOUNTER — Encounter: Payer: Self-pay | Admitting: Physical Therapy

## 2015-06-12 ENCOUNTER — Ambulatory Visit: Payer: Commercial Managed Care - HMO | Admitting: Physical Therapy

## 2015-06-12 DIAGNOSIS — Z89511 Acquired absence of right leg below knee: Secondary | ICD-10-CM | POA: Diagnosis not present

## 2015-06-12 DIAGNOSIS — Z89512 Acquired absence of left leg below knee: Secondary | ICD-10-CM | POA: Diagnosis not present

## 2015-06-12 DIAGNOSIS — Z7409 Other reduced mobility: Secondary | ICD-10-CM

## 2015-06-12 DIAGNOSIS — R269 Unspecified abnormalities of gait and mobility: Secondary | ICD-10-CM | POA: Diagnosis not present

## 2015-06-12 DIAGNOSIS — R2689 Other abnormalities of gait and mobility: Secondary | ICD-10-CM

## 2015-06-12 DIAGNOSIS — R29818 Other symptoms and signs involving the nervous system: Secondary | ICD-10-CM | POA: Diagnosis not present

## 2015-06-12 NOTE — Therapy (Signed)
[facility] [address] [place], [place], [address] Phone: [phone]   Fax:  [phone]  Physical Therapy Treatment  Patient Details  Name: Diana Coleman MRN: [id number] Date of Birth: [birth year] Referring Provider:  Lucianne Lei, MD  Encounter Date: 06/12/2015      PT End of Session - 06/12/15 0845    Visit Number 27   Number of Visits 31   Date for PT Re-Evaluation 07/11/15   PT Start Time 0846   PT Stop Time 0916   PT Time Calculation (min) 30 min   Equipment Utilized During Treatment Gait belt   Activity Tolerance Patient tolerated treatment well   Behavior During Therapy [facility] for tasks assessed/performed      Past Medical History  Diagnosis Date  . Type II diabetes mellitus   . Charcot foot due to diabetes mellitus   . Cellulitis of foot, left 10/24/2014    hx/notes 10/24/2014  . Osteomyelitis of right foot     hx/notes 10/24/2014    Past Surgical History  Procedure Laterality Date  . Above knee leg amputation Right 2004  . Tubal ligation  1973  . Reduction mammaplasty Bilateral 1987  . Foot surgery Left 1980's    "ulcer removed"  . Toe amputation Left ~ 2011    "top of my 3rd toe"  . Amputation Left 10/29/2014    Procedure: AMPUTATION BELOW KNEE - LEFT;  Surgeon: Newt Minion, MD;  Location: [facility];  Service: Orthopedics;  Laterality: Left;    There were no vitals filed for this visit.  Visit Diagnosis:  Abnormality of gait  Balance problems  Impaired functional mobility and activity tolerance  Status post bilateral below knee amputation      Subjective Assessment - 06/12/15 0848    Subjective (p) no falls Continue to wear prostheses all awake hours without issues.   Currently in Pain? (p) No/denies             Prosthetics Assessment - 06/12/15 0845    Prosthetics   Prosthetic Care Independent with Skin check;Residual limb care;Prosthetic cleaning;Ply sock cleaning;Correct  ply sock adjustment;Proper wear schedule/adjustment;Proper weight-bearing schedule/adjustment  donning prostheses   Donning prosthesis  Independent   Doffing prosthesis  Independent   K code/activity level with prosthetic use  3                    [facility] Adult PT Treatment/Exercise - 06/12/15 0845    Transfers   Sit to Stand 6: Modified independent (Device/Increase time);With upper extremity assist;From chair/3-in-1   Stand to Sit 6: Modified independent (Device/Increase time);With upper extremity assist;To chair/3-in-1   Ambulation/Gait   Ambulation/Gait Yes   Ambulation/Gait Assistance 6: Modified independent (Device/Increase time)   Ambulation Distance (Feet) 750 Feet   Assistive device Prostheses;Straight cane  modified indep. with cane 750', no device 200'    Gait Pattern Step-through pattern;Wide base of support;Decreased trunk rotation   Ambulation Surface Indoor;Level;Outdoor;Unlevel;Grass   Stairs Yes   Stairs Assistance 6: Modified independent (Device/Increase time)   Stair Management Technique One rail Right;One rail Left;Alternating pattern;Forwards;With cane   Number of Stairs 4  x 3 reps   Ramp 6: Modified independent (Device)  cane & prostheses   Curb 6: Modified independent (Device/increase time)  cane & prostheses   Dynamic Standing Balance   Dynamic Standing - Balance Support --   Dynamic Standing - Level of Assistance --   Dynamic Standing - Balance Activities --  High Level Balance   High Level Balance Activities --   High Level Balance Comments --   Prosthetics   Current prosthetic wear tolerance (days/week)  7 days   Current prosthetic wear tolerance (#hours/day)  all awake hours for both prostheses   Residual limb condition  wound healed no issues.   Donning Prosthesis Independent   Doffing Prosthesis Independent                PT Education - 06/12/15 0845    Education provided Yes   Education Details follow up with prosthetist    Person(s) Educated Patient   Methods Explanation   Comprehension Verbalized understanding          PT Short Term Goals - 05/15/15 1411    PT SHORT TERM GOAL #1   Title demonstrates proper donning of prostheses (Target Date: 03/16/15)   Time 4   Period Weeks   Status Achieved   PT SHORT TERM GOAL #2   Title tolerates wear of bilateral prostheses >90% of awake hours with no skin issues and discomfort <2/10 (Target Date: 03/16/15)   Baseline 03/19/15- not met for left leg, met for right leg   Time 4   Period Weeks   Status Not Met   PT SHORT TERM GOAL #3   Title ambulates 200' with [facility] & prostheses modified independent. (Target Date: 03/16/15)   Time 4   Period Weeks   Status Achieved   PT SHORT TERM GOAL #4   Title negotiates ramp & curb with [facility] & prostheses with supervision.    Baseline MET 05/15/15   Time 4   Period Weeks   Status Achieved   PT SHORT TERM GOAL #5   Title manages clothes including pants for toileting and jacket in standing safely modified independent. (Target Date: 03/16/15)   Time 4   Period Weeks   Status Achieved   Additional Short Term Goals   Additional Short Term Goals Yes   PT SHORT TERM GOAL #6   Title ambulates 150' with prostheses only, no assistive device for household ambulation modified independent. (Target Date: 06/12/15)   Time 4   Period Weeks   Status New   PT SHORT TERM GOAL #7   Title tolerates 30 minutes of standing activities with no seated rest breaks in order to cook/prepare entire meal modified independent. (Target Date: 06/12/15)   Time 4   Period Weeks   Status New           PT Long Term Goals - 06/12/15 0845    PT LONG TERM GOAL #1   Title independent with prosthetic care.    Baseline MET 05/15/15)   Time 8   Period Weeks   Status Achieved   PT LONG TERM GOAL #2   Title wears bilateral prostheses >90% of awake hours with no skin issues or pain.  (NEW Target Date: 07/09/15)   Baseline MET 06/12/2015   Time 8    Period Weeks   Status Achieved   PT LONG TERM GOAL #3   Title ambulates 100' with bilateral prostheses carrying plate & cup modified independent.     Baseline (MET 05/15/15)   Time 8   Period Weeks   Status Achieved   PT LONG TERM GOAL #4   Title ambulates 300' with LRAD & bilateral prostheses modified independent.     Baseline (MET 05/15/15)   Time 8   Period Weeks   Status Achieved   PT LONG TERM GOAL #5  Title negotiates ramp, curb & stairs (1 rail reciprocal) with only bilateral prostheses modified independent. (NEW Target Date: 07/09/15)   Baseline MET 05/15/15 with single point cane, updated    Time 8   Period Weeks   Status Achieved   PT LONG TERM GOAL #6   Title Timed Up & Go with prostheses only <13.5 seconds     Baseline MET 05/15/15 11.67 sec without AD, 11.41 sec with SPC.   Time 8   Period Weeks   Status Achieved   PT LONG TERM GOAL #7   Title Berg Balance >30/56     Baseline MET 05/15/15 scored 50/56   Time 8   Period Weeks   Status Achieved               Plan - 06/12/15 0845    Clinical Impression Statement Patient met all LTGs. Her wounds have healed. She is functioning with prostheses at full community level with variable caedence .    Pt will benefit from skilled therapeutic intervention in order to improve on the following deficits Abnormal gait;Decreased activity tolerance;Decreased balance;Decreased endurance;Decreased knowledge of use of DME;Decreased mobility;Decreased range of motion;Decreased strength;Other (comment)  prosthetic dependency   Rehab Potential Good   PT Frequency 1x / week   PT Duration 8 weeks   PT Treatment/Interventions ADLs/Self Care Home Management;DME Instruction;Gait training;Stair training;Functional mobility training;Therapeutic activities;Therapeutic exercise;Balance training;Neuromuscular re-education;Patient/family education;Other (comment)  prosthetic training   PT Next Visit Plan discharge today   Consulted and  Agree with Plan of Care Patient        Problem List Patient Active Problem List   Diagnosis Date Noted  . Gastroparesis 11/02/2014  . Diabetes mellitus due to underlying condition with other specified complication   . Diabetic foot   . Nausea & vomiting   . Essential hypertension 10/25/2014  . GERD (gastroesophageal reflux disease) 10/25/2014  . Cellulitis 10/24/2014  . Diabetes 10/24/2014  . Charcot foot due to diabetes mellitus 10/24/2014    Jamey Reas PT, DPT 06/12/2015, 9:06 PM  [facility] [address] [place], [place], [address] Phone: [phone]   Fax:  [phone]

## 2015-06-12 NOTE — Therapy (Signed)
[facility] [address] [place], [place], [address] Phone: [phone]   Fax:  [phone]  Patient Details  Name: Diana Coleman MRN: [id number] Date of Birth: 1951/11/02 Referring Provider:  No ref. provider found  Encounter Date: 06/12/2015  PHYSICAL THERAPY DISCHARGE SUMMARY  Visits from Start of Care: 27  Current functional level related to goals / functional outcomes:     PT Long Term Goals - 06/12/15 0845    PT LONG TERM GOAL #1   Title independent with prosthetic care.    Baseline MET 05/15/15)   Time 8   Period Weeks   Status Achieved   PT LONG TERM GOAL #2   Title wears bilateral prostheses >90% of awake hours with no skin issues or pain.  (NEW Target Date: 07/09/15)   Baseline MET 06/12/2015   Time 8   Period Weeks   Status Achieved   PT LONG TERM GOAL #3   Title ambulates 100' with bilateral prostheses carrying plate & cup modified independent.     Baseline (MET 05/15/15)   Time 8   Period Weeks   Status Achieved   PT LONG TERM GOAL #4   Title ambulates 300' with LRAD & bilateral prostheses modified independent.     Baseline (MET 05/15/15)   Time 8   Period Weeks   Status Achieved   PT LONG TERM GOAL #5   Title negotiates ramp, curb & stairs (1 rail reciprocal) with only bilateral prostheses modified independent. (NEW Target Date: 07/09/15)   Baseline MET 05/15/15 with single point cane, updated    Time 8   Period Weeks   Status Achieved   PT LONG TERM GOAL #6   Title Timed Up & Go with prostheses only <13.5 seconds     Baseline MET 05/15/15 11.67 sec without AD, 11.41 sec with SPC.   Time 8   Period Weeks   Status Achieved   PT LONG TERM GOAL #7   Title Berg Balance >30/56     Baseline MET 05/15/15 scored 50/56   Time 8   Period Weeks   Status Achieved       Remaining deficits: Berg Balance 50/56 indicates mild fall risk.   Education / Equipment: Press photographer & fitness  plan  Plan: Patient agrees to discharge.  Patient goals were met. Patient is being discharged due to meeting the stated rehab goals.  ?????        Vona Whiters PT, DPT 06/12/2015, 9:08 PM  [facility] [address] [place], [place], [address] Phone: [phone]   Fax:  [phone]

## 2015-06-15 DIAGNOSIS — Z1211 Encounter for screening for malignant neoplasm of colon: Secondary | ICD-10-CM | POA: Diagnosis not present

## 2015-06-15 DIAGNOSIS — Z800 Family history of malignant neoplasm of digestive organs: Secondary | ICD-10-CM | POA: Diagnosis not present

## 2015-06-15 DIAGNOSIS — E119 Type 2 diabetes mellitus without complications: Secondary | ICD-10-CM | POA: Diagnosis not present

## 2015-06-19 ENCOUNTER — Ambulatory Visit: Payer: Commercial Managed Care - HMO | Admitting: Physical Therapy

## 2015-06-26 ENCOUNTER — Encounter: Payer: Self-pay | Admitting: Physical Therapy

## 2015-07-03 ENCOUNTER — Encounter: Payer: Self-pay | Admitting: Physical Therapy

## 2015-07-05 DIAGNOSIS — T148 Other injury of unspecified body region: Secondary | ICD-10-CM | POA: Diagnosis not present

## 2015-07-09 DIAGNOSIS — E1142 Type 2 diabetes mellitus with diabetic polyneuropathy: Secondary | ICD-10-CM | POA: Diagnosis not present

## 2015-07-09 DIAGNOSIS — L97221 Non-pressure chronic ulcer of left calf limited to breakdown of skin: Secondary | ICD-10-CM | POA: Diagnosis not present

## 2015-07-10 ENCOUNTER — Encounter: Payer: Self-pay | Admitting: Physical Therapy

## 2015-07-13 DIAGNOSIS — E1065 Type 1 diabetes mellitus with hyperglycemia: Secondary | ICD-10-CM | POA: Diagnosis not present

## 2015-07-17 ENCOUNTER — Encounter: Payer: Self-pay | Admitting: Physical Therapy

## 2015-07-24 ENCOUNTER — Encounter: Payer: Self-pay | Admitting: Physical Therapy

## 2015-07-26 DIAGNOSIS — D124 Benign neoplasm of descending colon: Secondary | ICD-10-CM | POA: Diagnosis not present

## 2015-07-26 DIAGNOSIS — K5730 Diverticulosis of large intestine without perforation or abscess without bleeding: Secondary | ICD-10-CM | POA: Diagnosis not present

## 2015-07-26 DIAGNOSIS — Z1211 Encounter for screening for malignant neoplasm of colon: Secondary | ICD-10-CM | POA: Diagnosis not present

## 2015-07-26 DIAGNOSIS — D123 Benign neoplasm of transverse colon: Secondary | ICD-10-CM | POA: Diagnosis not present

## 2015-07-26 DIAGNOSIS — D126 Benign neoplasm of colon, unspecified: Secondary | ICD-10-CM | POA: Diagnosis not present

## 2015-07-31 ENCOUNTER — Emergency Department (HOSPITAL_COMMUNITY)
Admission: EM | Admit: 2015-07-31 | Discharge: 2015-07-31 | Disposition: A | Discharge status: Home / Self Care | Payer: Commercial Managed Care - HMO | Attending: Emergency Medicine | Admitting: Emergency Medicine

## 2015-07-31 ENCOUNTER — Encounter (HOSPITAL_COMMUNITY): Payer: Self-pay | Admitting: *Deleted

## 2015-07-31 DIAGNOSIS — Z89611 Acquired absence of right leg above knee: Secondary | ICD-10-CM | POA: Insufficient documentation

## 2015-07-31 DIAGNOSIS — Z87891 Personal history of nicotine dependence: Secondary | ICD-10-CM | POA: Insufficient documentation

## 2015-07-31 DIAGNOSIS — Z89512 Acquired absence of left leg below knee: Secondary | ICD-10-CM | POA: Insufficient documentation

## 2015-07-31 DIAGNOSIS — M545 Low back pain, unspecified: Secondary | ICD-10-CM

## 2015-07-31 DIAGNOSIS — Z79899 Other long term (current) drug therapy: Secondary | ICD-10-CM | POA: Insufficient documentation

## 2015-07-31 DIAGNOSIS — Z794 Long term (current) use of insulin: Secondary | ICD-10-CM | POA: Diagnosis not present

## 2015-07-31 DIAGNOSIS — M546 Pain in thoracic spine: Secondary | ICD-10-CM | POA: Insufficient documentation

## 2015-07-31 DIAGNOSIS — E11610 Type 2 diabetes mellitus with diabetic neuropathic arthropathy: Secondary | ICD-10-CM | POA: Insufficient documentation

## 2015-07-31 DIAGNOSIS — Z872 Personal history of diseases of the skin and subcutaneous tissue: Secondary | ICD-10-CM | POA: Diagnosis not present

## 2015-07-31 LAB — CBC WITH DIFFERENTIAL/PLATELET
BASOS ABS: 0 10*3/uL (ref 0.0–0.1)
BASOS PCT: 0 % (ref 0–1)
EOS PCT: 1 % (ref 0–5)
Eosinophils Absolute: 0.1 10*3/uL (ref 0.0–0.7)
HEMATOCRIT: 36.5 % (ref 36.0–46.0)
HEMOGLOBIN: 12.1 g/dL (ref 12.0–15.0)
LYMPHS ABS: 1.8 10*3/uL (ref 0.7–4.0)
Lymphocytes Relative: 22 % (ref 12–46)
MCH: 28.1 pg (ref 26.0–34.0)
MCHC: 33.2 g/dL (ref 30.0–36.0)
MCV: 84.7 fL (ref 78.0–100.0)
Monocytes Absolute: 0.9 10*3/uL (ref 0.1–1.0)
Monocytes Relative: 10 % (ref 3–12)
NEUTROS PCT: 67 % (ref 43–77)
Neutro Abs: 5.5 10*3/uL (ref 1.7–7.7)
PLATELETS: 315 10*3/uL (ref 150–400)
RBC: 4.31 MIL/uL (ref 3.87–5.11)
RDW: 13.4 % (ref 11.5–15.5)
WBC: 8.2 10*3/uL (ref 4.0–10.5)

## 2015-07-31 LAB — COMPREHENSIVE METABOLIC PANEL
ALBUMIN: 3.2 g/dL — AB (ref 3.5–5.0)
ALT: 8 U/L — ABNORMAL LOW (ref 14–54)
AST: 12 U/L — ABNORMAL LOW (ref 15–41)
Alkaline Phosphatase: 92 U/L (ref 38–126)
Anion gap: 10 (ref 5–15)
BUN: 15 mg/dL (ref 6–20)
CHLORIDE: 105 mmol/L (ref 101–111)
CO2: 24 mmol/L (ref 22–32)
Calcium: 8.8 mg/dL — ABNORMAL LOW (ref 8.9–10.3)
Creatinine, Ser: 0.88 mg/dL (ref 0.44–1.00)
GFR calc Af Amer: >60 mL/min (ref >60)
GFR calc non Af Amer: >60 mL/min (ref >60)
Glucose, Bld: 167 mg/dL — ABNORMAL HIGH (ref 65–99)
POTASSIUM: 3.9 mmol/L (ref 3.5–5.1)
Sodium: 139 mmol/L (ref 135–145)
TOTAL PROTEIN: 7.1 g/dL (ref 6.5–8.1)
Total Bilirubin: 0.5 mg/dL (ref 0.3–1.2)

## 2015-07-31 LAB — URINALYSIS, ROUTINE W REFLEX MICROSCOPIC
BILIRUBIN URINE: NEGATIVE
Glucose, UA: NEGATIVE mg/dL
KETONES UR: NEGATIVE mg/dL
NITRITE: NEGATIVE
Protein, ur: NEGATIVE mg/dL
Specific Gravity, Urine: 1.022 (ref 1.005–1.030)
UROBILINOGEN UA: 1 mg/dL (ref 0.0–1.0)
pH: 5 (ref 5.0–8.0)

## 2015-07-31 LAB — URINE MICROSCOPIC-ADD ON

## 2015-07-31 MED ORDER — HYDROMORPHONE HCL 1 MG/ML IJ SOLN
0.5000 mg | Freq: Once | INTRAMUSCULAR | Status: AC
  Administered 2015-07-31: 0.5 mg via INTRAVENOUS
  Filled 2015-07-31: qty 1

## 2015-07-31 MED ORDER — HYDROCODONE-ACETAMINOPHEN 5-325 MG PO TABS: 1.0000 | ORAL_TABLET | Freq: Four times a day (QID) | ORAL | Status: DC | PRN

## 2015-07-31 MED ORDER — CYCLOBENZAPRINE HCL 10 MG PO TABS
10.0000 mg | ORAL_TABLET | Freq: Two times a day (BID) | ORAL | Status: DC | PRN
Start: 2015-07-31 — End: 2016-10-04

## 2015-07-31 MED ORDER — ONDANSETRON HCL 4 MG/2ML IJ SOLN
4.0000 mg | Freq: Once | INTRAMUSCULAR | Status: AC
  Administered 2015-07-31: 4 mg via INTRAVENOUS
  Filled 2015-07-31: qty 2

## 2015-07-31 MED ORDER — CYCLOBENZAPRINE HCL 10 MG PO TABS
10.0000 mg | ORAL_TABLET | Freq: Once | ORAL | Status: AC
  Administered 2015-07-31: 10 mg via ORAL
  Filled 2015-07-31: qty 1

## 2015-07-31 MED ORDER — KETOROLAC TROMETHAMINE 15 MG/ML IJ SOLN
15.0000 mg | Freq: Once | INTRAMUSCULAR | Status: AC
  Administered 2015-07-31: 15 mg via INTRAVENOUS
  Filled 2015-07-31: qty 1

## 2015-07-31 NOTE — ED Provider Notes (Addendum)
CSN: [id number]     Arrival date & time 07/31/15  0655 History   First MD Initiated Contact with Patient 07/31/15 [phone]     Chief Complaint  Patient presents with  . Back Pain     (Consider location/radiation/quality/duration/timing/severity/associated sxs/prior Treatment) HPI Comments: 64 year old female with a past medical history of type 2 diabetes who currently started an insulin pump on Friday, hypertension, bilateral above knee amputation who presents today with bilateral lower back pain that started on Friday. She states on Thursday she had a colonoscopy when she woke up on Friday started having the pain in her back. Initially it was more so on the left side and now it seems to be more so on the right. It is worse with bending and twisting sitting up and movement in general. It does not shoot down into her legs and it causes no incontinence of bowel or urinary retention. She has noted more frequent urination in the last few days however since starting her insulin pump her sugar up and down and she still getting used to using the pump. She states she's had a similar pain like this maybe 2 or 3 times over the last 4-5 years and will come to the hospital they would give her pain medication and the pain would go away. She denies any instrumentation of her back and other than the insulin pump no new medications. No fever, dysuria, vaginal discharge, abdominal pain, nausea or vomiting. She has noted a decreased appetite since having the colonoscopy and has not started having regular bowel movements yet.  Patient is a 64 y.o. female presenting with back pain. The history is provided by the patient.  Back Pain Location:  Lumbar spine and thoracic spine Quality:  Aching, cramping and stiffness Stiffness is present:  All day Radiates to:  Does not radiate Pain severity:  Moderate Onset quality:  Gradual Duration:  5 days Timing:  Constant Progression:  Unchanged Chronicity:  Recurrent Worsened by:   Bending, movement and twisting Ineffective treatments:  Bed rest Associated symptoms: no abdominal pain, no bladder incontinence, no bowel incontinence, no chest pain, no dysuria, no fever, no leg pain, no numbness, no paresthesias and no weakness   Associated symptoms comment:  Decreased appetite   Past Medical History  Diagnosis Date  . Type II diabetes mellitus   . Charcot foot due to diabetes mellitus   . Cellulitis of foot, left 10/24/2014    hx/notes 10/24/2014  . Osteomyelitis of right foot     hx/notes 10/24/2014   Past Surgical History  Procedure Laterality Date  . Above knee leg amputation Right 2004  . Tubal ligation  1973  . Reduction mammaplasty Bilateral 1987  . Foot surgery Left 1980's    "ulcer removed"  . Toe amputation Left ~ 2011    "top of my 3rd toe"  . Amputation Left 10/29/2014    Procedure: AMPUTATION BELOW KNEE - LEFT;  Surgeon: Newt Minion, MD;  Location: [facility];  Service: Orthopedics;  Laterality: Left;   Family History  Problem Relation Age of Onset  . Uterine cancer Mother   . Gout Father   . Thyroid disease Sister    History  Substance Use Topics  . Smoking status: Former Smoker -- 0.50 packs/day for 30 years    Types: Cigarettes  . Smokeless tobacco: Never Used     Comment: "quit smoking ~ 2000"  . Alcohol Use: No   OB History    No data available  Review of Systems  Constitutional: Negative for fever.  Cardiovascular: Negative for chest pain.  Gastrointestinal: Negative for abdominal pain and bowel incontinence.  Genitourinary: Negative for bladder incontinence and dysuria.  Musculoskeletal: Positive for back pain.  Neurological: Negative for weakness, numbness and paresthesias.  All other systems reviewed and are negative.     Allergies  Clindamycin/lincomycin and Codeine  Home Medications   Prior to Admission medications   Medication Sig Start Date End Date Taking? Authorizing Provider  amLODipine (NORVASC) 5 MG tablet  Take 1 tablet (5 mg total) by mouth daily. 10/31/14   Shanker Kristeen Mans, MD  famotidine (PEPCID) 20 MG tablet Take 20 mg by mouth daily.  09/29/14   Historical Provider, MD  insulin aspart (NOVOLOG) 100 UNIT/ML injection 0-15 Units, Subcutaneous, 3 times daily with meals CBG < 70: implement hypoglycemia protocol CBG 70 - 120: 0 units CBG 121 - 150: 2 units CBG 151 - 200: 3 units CBG 201 - 250: 5 units CBG 251 - 300: 8 units CBG 301 - 350: 11 units CBG 351 - 400: 15 units CBG > 400: call MD 10/31/14   Jonetta Osgood, MD  Insulin Glargine (TOUJEO SOLOSTAR) 300 UNIT/ML SOPN Inject 22 Units into the skin at bedtime. 10/31/14   Shanker Kristeen Mans, MD  lisinopril (PRINIVIL,ZESTRIL) 10 MG tablet Take 1 tablet (10 mg total) by mouth daily. Patient not taking: Reported on 02/15/2015 11/02/14   Kelvin Cellar, MD  metoCLOPramide (REGLAN) 10 MG tablet Take 1 tablet (10 mg total) by mouth 3 (three) times daily before meals. Patient not taking: Reported on 02/15/2015 11/02/14   Kelvin Cellar, MD  ondansetron (ZOFRAN) 4 MG tablet Take 1 tablet (4 mg total) by mouth every 6 (six) hours as needed for nausea. 10/31/14   Shanker Kristeen Mans, MD  oxyCODONE-acetaminophen (PERCOCET/ROXICET) 5-325 MG per tablet Take 1-2 tablets by mouth every 6 (six) hours as needed for moderate pain or severe pain. 10/31/14   Shanker Kristeen Mans, MD  polyethylene glycol (MIRALAX / GLYCOLAX) packet Take 17 g by mouth daily. 10/31/14   Shanker Kristeen Mans, MD  senna-docusate (SENOKOT-S) 8.6-50 MG per tablet Take 2 tablets by mouth 2 (two) times daily. Patient not taking: Reported on 02/15/2015 10/31/14   Jonetta Osgood, MD   BP 136/74 mmHg  Pulse 97  Temp(Src) 98 F (36.7 C) (Oral)  Resp 20  Ht 5\' 7"  (1.702 m)  Wt 184 lb 9 oz (83.717 kg)  BMI 28.90 kg/m2  SpO2 98% Physical Exam  Constitutional: She is oriented to person, place, and time. She appears well-developed and well-nourished. No distress.  HENT:  Head:  Normocephalic and atraumatic.  Mouth/Throat: Oropharynx is clear and moist.  Eyes: Conjunctivae and EOM are normal. Pupils are equal, round, and reactive to light.  Neck: Normal range of motion. Neck supple.  Cardiovascular: Normal rate, regular rhythm and intact distal pulses.   No murmur heard. Pulmonary/Chest: Effort normal and breath sounds normal. No respiratory distress. She has no wheezes. She has no rales.  Abdominal: Soft. She exhibits no distension and no pulsatile midline mass. There is no tenderness. There is no rebound and no guarding.  Mild bilateral CVA tenderness  Musculoskeletal: Normal range of motion. She exhibits no edema.       Lumbar back: She exhibits tenderness, bony tenderness and pain. She exhibits no spasm.       Back:  right AKA and left BKA.  Most notable pain in the left paralumbar spinal area minimal  central tenderness over the lumbar spine. Pain reproduced with sitting up and twisting from side to side  Neurological: She is alert and oriented to person, place, and time.  Skin: Skin is warm and dry. No rash noted. No erythema.  Psychiatric: She has a normal mood and affect. Her behavior is normal.  Nursing note and vitals reviewed.   ED Course  Procedures (including critical care time) Labs Review Labs Reviewed  COMPREHENSIVE METABOLIC PANEL - Abnormal; Notable for the following:    Glucose, Bld 167 (*)    Calcium 8.8 (*)    Albumin 3.2 (*)    AST 12 (*)    ALT 8 (*)    All other components within normal limits  URINALYSIS, ROUTINE W REFLEX MICROSCOPIC (NOT AT [facility]) - Abnormal; Notable for the following:    Hgb urine dipstick SMALL (*)    Leukocytes, UA SMALL (*)    All other components within normal limits  URINE MICROSCOPIC-ADD ON - Abnormal; Notable for the following:    Squamous Epithelial / LPF FEW (*)    All other components within normal limits  CBC WITH DIFFERENTIAL/PLATELET    Imaging Review No results found.   EKG  Interpretation None      MDM   Final diagnoses:  Bilateral low back pain without sciatica    Patient with a history of type 2 diabetes who currently started on an insulin pump on Friday who presents today with a 5 day history of lower back pain. The back pain started the day after she had her colonoscopy. She does admit to having back pain similar to this in the past. She states when it occurred in the past she would come to the hospital they were given her dose of pain medicine and the pain would resolve. She denies any fevers or infectious symptoms. She has been urinating more frequently however since starting the insulin pump her sugars have been up and down and she still getting used to using the pump. Most likely her urinary frequency is related to her uncontrolled blood sugars. She denies any nausea vomiting or abdominal pain. Low suspicion for abdominal perforation from colonoscopy. No pulsatile midline abdominal masses concerning for AAA as the cause of her back pain. She denies any neurologic symptoms and the majority of her tenderness is in the musculature of the lower back. Feel that the back pain could've been irritated by positioning during her colonoscopy. Low suspicion for pyelonephritis at this time.  Patient denies any trauma. Symptoms are reproduced with movement and palpation most typical of musculoskeletal pain.  However given recent colonoscopy, initiation of insulin pump and urinary frequency will do a CBC, CMP and UA to evaluate for electrolyte disturbance, renal function and rule out developing urinary tract infection.  Patient given pain control here.  9:51 AM Labs are within normal limits without findings of acute abnormality. On reevaluation patient states her pain is not much improved after the pain medication. Will give a second dose of Dilaudid and some Flexeril as well as a dose of Toradol.  10:51 AM Pt's back pain has resolved.  Will d/c home with vicodin and  flexeril. Blanchie Dessert, MD 07/31/15 2725  Blanchie Dessert, MD 07/31/15 [place], MD 07/31/15 1054

## 2015-07-31 NOTE — ED Notes (Addendum)
Patient with complaints of bil lower back pain since Saturday.  She states she may have had a fever.  States she has been voiding more.  Patient denies trauma.    She is diabetic.  She has new insulin pump since Friday

## 2015-07-31 NOTE — ED Notes (Signed)
Pt ambulatory with cain at discharge as normal. A/O x4. Steady gait. NAD. Pain 3/10. Pt wheeled to waiting room.

## 2015-08-06 DIAGNOSIS — E118 Type 2 diabetes mellitus with unspecified complications: Secondary | ICD-10-CM | POA: Diagnosis not present

## 2015-08-06 DIAGNOSIS — M545 Low back pain: Secondary | ICD-10-CM | POA: Diagnosis not present

## 2015-08-13 DIAGNOSIS — E1065 Type 1 diabetes mellitus with hyperglycemia: Secondary | ICD-10-CM | POA: Diagnosis not present

## 2015-09-03 DIAGNOSIS — I10 Essential (primary) hypertension: Secondary | ICD-10-CM | POA: Diagnosis not present

## 2015-09-03 DIAGNOSIS — E118 Type 2 diabetes mellitus with unspecified complications: Secondary | ICD-10-CM | POA: Diagnosis not present

## 2015-09-13 DIAGNOSIS — E1065 Type 1 diabetes mellitus with hyperglycemia: Secondary | ICD-10-CM | POA: Diagnosis not present

## 2015-10-02 DIAGNOSIS — E1140 Type 2 diabetes mellitus with diabetic neuropathy, unspecified: Secondary | ICD-10-CM | POA: Diagnosis not present

## 2015-10-02 DIAGNOSIS — E0951 Drug or chemical induced diabetes mellitus with diabetic peripheral angiopathy without gangrene: Secondary | ICD-10-CM | POA: Diagnosis not present

## 2015-10-02 DIAGNOSIS — E0959 Drug or chemical induced diabetes mellitus with other circulatory complications: Secondary | ICD-10-CM | POA: Diagnosis not present

## 2015-10-02 DIAGNOSIS — I110 Hypertensive heart disease with heart failure: Secondary | ICD-10-CM | POA: Diagnosis not present

## 2015-10-12 DIAGNOSIS — E1065 Type 1 diabetes mellitus with hyperglycemia: Secondary | ICD-10-CM | POA: Diagnosis not present

## 2015-10-13 DIAGNOSIS — E1065 Type 1 diabetes mellitus with hyperglycemia: Secondary | ICD-10-CM | POA: Diagnosis not present

## 2015-11-13 DIAGNOSIS — E1065 Type 1 diabetes mellitus with hyperglycemia: Secondary | ICD-10-CM | POA: Diagnosis not present

## 2015-12-04 DIAGNOSIS — I10 Essential (primary) hypertension: Secondary | ICD-10-CM | POA: Diagnosis not present

## 2015-12-04 DIAGNOSIS — E0951 Drug or chemical induced diabetes mellitus with diabetic peripheral angiopathy without gangrene: Secondary | ICD-10-CM | POA: Diagnosis not present

## 2015-12-04 DIAGNOSIS — E118 Type 2 diabetes mellitus with unspecified complications: Secondary | ICD-10-CM | POA: Diagnosis not present

## 2015-12-04 DIAGNOSIS — E0840 Diabetes mellitus due to underlying condition with diabetic neuropathy, unspecified: Secondary | ICD-10-CM | POA: Diagnosis not present

## 2015-12-04 DIAGNOSIS — E0959 Drug or chemical induced diabetes mellitus with other circulatory complications: Secondary | ICD-10-CM | POA: Diagnosis not present

## 2015-12-04 DIAGNOSIS — L03116 Cellulitis of left lower limb: Secondary | ICD-10-CM | POA: Diagnosis not present

## 2015-12-13 DIAGNOSIS — E1065 Type 1 diabetes mellitus with hyperglycemia: Secondary | ICD-10-CM | POA: Diagnosis not present

## 2016-02-04 DIAGNOSIS — E1065 Type 1 diabetes mellitus with hyperglycemia: Secondary | ICD-10-CM | POA: Diagnosis not present

## 2016-02-13 DIAGNOSIS — E1065 Type 1 diabetes mellitus with hyperglycemia: Secondary | ICD-10-CM | POA: Diagnosis not present

## 2016-03-12 DIAGNOSIS — E1065 Type 1 diabetes mellitus with hyperglycemia: Secondary | ICD-10-CM | POA: Diagnosis not present

## 2016-03-14 DIAGNOSIS — R5383 Other fatigue: Secondary | ICD-10-CM | POA: Diagnosis not present

## 2016-03-14 DIAGNOSIS — R3915 Urgency of urination: Secondary | ICD-10-CM | POA: Diagnosis not present

## 2016-03-14 DIAGNOSIS — R1011 Right upper quadrant pain: Secondary | ICD-10-CM | POA: Diagnosis not present

## 2016-03-14 DIAGNOSIS — R112 Nausea with vomiting, unspecified: Secondary | ICD-10-CM | POA: Diagnosis not present

## 2016-03-14 DIAGNOSIS — N134 Hydroureter: Secondary | ICD-10-CM | POA: Diagnosis not present

## 2016-03-14 DIAGNOSIS — N1330 Unspecified hydronephrosis: Secondary | ICD-10-CM | POA: Diagnosis not present

## 2016-03-14 DIAGNOSIS — E1165 Type 2 diabetes mellitus with hyperglycemia: Secondary | ICD-10-CM | POA: Diagnosis not present

## 2016-03-14 DIAGNOSIS — N2889 Other specified disorders of kidney and ureter: Secondary | ICD-10-CM | POA: Diagnosis not present

## 2016-03-14 DIAGNOSIS — N320 Bladder-neck obstruction: Secondary | ICD-10-CM | POA: Diagnosis not present

## 2016-03-14 DIAGNOSIS — K8689 Other specified diseases of pancreas: Secondary | ICD-10-CM | POA: Diagnosis not present

## 2016-03-14 DIAGNOSIS — R1084 Generalized abdominal pain: Secondary | ICD-10-CM | POA: Diagnosis not present

## 2016-03-14 DIAGNOSIS — R5381 Other malaise: Secondary | ICD-10-CM | POA: Diagnosis not present

## 2016-03-14 DIAGNOSIS — Z9641 Presence of insulin pump (external) (internal): Secondary | ICD-10-CM | POA: Diagnosis not present

## 2016-03-14 DIAGNOSIS — R102 Pelvic and perineal pain: Secondary | ICD-10-CM | POA: Diagnosis not present

## 2016-03-19 DIAGNOSIS — N320 Bladder-neck obstruction: Secondary | ICD-10-CM | POA: Diagnosis not present

## 2016-03-19 DIAGNOSIS — N329 Bladder disorder, unspecified: Secondary | ICD-10-CM | POA: Diagnosis not present

## 2016-03-20 DIAGNOSIS — I10 Essential (primary) hypertension: Secondary | ICD-10-CM | POA: Diagnosis not present

## 2016-03-20 DIAGNOSIS — Z9641 Presence of insulin pump (external) (internal): Secondary | ICD-10-CM | POA: Diagnosis not present

## 2016-03-20 DIAGNOSIS — E1065 Type 1 diabetes mellitus with hyperglycemia: Secondary | ICD-10-CM | POA: Diagnosis not present

## 2016-03-20 DIAGNOSIS — Z89511 Acquired absence of right leg below knee: Secondary | ICD-10-CM | POA: Diagnosis not present

## 2016-03-20 DIAGNOSIS — E1165 Type 2 diabetes mellitus with hyperglycemia: Secondary | ICD-10-CM | POA: Diagnosis not present

## 2016-03-20 DIAGNOSIS — Z79899 Other long term (current) drug therapy: Secondary | ICD-10-CM | POA: Diagnosis not present

## 2016-03-20 DIAGNOSIS — Z7984 Long term (current) use of oral hypoglycemic drugs: Secondary | ICD-10-CM | POA: Diagnosis not present

## 2016-03-20 DIAGNOSIS — Z794 Long term (current) use of insulin: Secondary | ICD-10-CM | POA: Diagnosis not present

## 2016-03-20 DIAGNOSIS — Z89512 Acquired absence of left leg below knee: Secondary | ICD-10-CM | POA: Diagnosis not present

## 2016-03-20 DIAGNOSIS — K219 Gastro-esophageal reflux disease without esophagitis: Secondary | ICD-10-CM | POA: Diagnosis not present

## 2016-03-20 DIAGNOSIS — R339 Retention of urine, unspecified: Secondary | ICD-10-CM | POA: Diagnosis not present

## 2016-03-21 DIAGNOSIS — Z794 Long term (current) use of insulin: Secondary | ICD-10-CM | POA: Diagnosis not present

## 2016-03-21 DIAGNOSIS — I10 Essential (primary) hypertension: Secondary | ICD-10-CM | POA: Diagnosis not present

## 2016-03-21 DIAGNOSIS — Z9641 Presence of insulin pump (external) (internal): Secondary | ICD-10-CM | POA: Diagnosis not present

## 2016-03-21 DIAGNOSIS — E1165 Type 2 diabetes mellitus with hyperglycemia: Secondary | ICD-10-CM | POA: Diagnosis not present

## 2016-03-21 DIAGNOSIS — K219 Gastro-esophageal reflux disease without esophagitis: Secondary | ICD-10-CM | POA: Diagnosis not present

## 2016-03-21 DIAGNOSIS — Z89511 Acquired absence of right leg below knee: Secondary | ICD-10-CM | POA: Diagnosis not present

## 2016-03-21 DIAGNOSIS — Z89512 Acquired absence of left leg below knee: Secondary | ICD-10-CM | POA: Diagnosis not present

## 2016-03-21 DIAGNOSIS — Z7984 Long term (current) use of oral hypoglycemic drugs: Secondary | ICD-10-CM | POA: Diagnosis not present

## 2016-03-26 DIAGNOSIS — R8290 Unspecified abnormal findings in urine: Secondary | ICD-10-CM | POA: Diagnosis not present

## 2016-03-26 DIAGNOSIS — R339 Retention of urine, unspecified: Secondary | ICD-10-CM | POA: Diagnosis not present

## 2016-03-26 DIAGNOSIS — N390 Urinary tract infection, site not specified: Secondary | ICD-10-CM | POA: Diagnosis not present

## 2016-04-12 DIAGNOSIS — E1065 Type 1 diabetes mellitus with hyperglycemia: Secondary | ICD-10-CM | POA: Diagnosis not present

## 2016-05-06 DIAGNOSIS — E785 Hyperlipidemia, unspecified: Secondary | ICD-10-CM | POA: Diagnosis not present

## 2016-05-06 DIAGNOSIS — Z6829 Body mass index (BMI) 29.0-29.9, adult: Secondary | ICD-10-CM | POA: Diagnosis not present

## 2016-05-06 DIAGNOSIS — E119 Type 2 diabetes mellitus without complications: Secondary | ICD-10-CM | POA: Diagnosis not present

## 2016-05-06 DIAGNOSIS — Z899 Acquired absence of limb, unspecified: Secondary | ICD-10-CM | POA: Diagnosis not present

## 2016-05-06 DIAGNOSIS — I10 Essential (primary) hypertension: Secondary | ICD-10-CM | POA: Diagnosis not present

## 2016-05-06 DIAGNOSIS — E7800 Pure hypercholesterolemia, unspecified: Secondary | ICD-10-CM | POA: Diagnosis not present

## 2016-05-16 DIAGNOSIS — I10 Essential (primary) hypertension: Secondary | ICD-10-CM | POA: Diagnosis not present

## 2016-05-16 DIAGNOSIS — E1140 Type 2 diabetes mellitus with diabetic neuropathy, unspecified: Secondary | ICD-10-CM | POA: Diagnosis not present

## 2016-06-10 DIAGNOSIS — E785 Hyperlipidemia, unspecified: Secondary | ICD-10-CM | POA: Diagnosis not present

## 2016-06-10 DIAGNOSIS — E1140 Type 2 diabetes mellitus with diabetic neuropathy, unspecified: Secondary | ICD-10-CM | POA: Diagnosis not present

## 2016-06-10 DIAGNOSIS — I10 Essential (primary) hypertension: Secondary | ICD-10-CM | POA: Diagnosis not present

## 2016-07-25 DIAGNOSIS — I119 Hypertensive heart disease without heart failure: Secondary | ICD-10-CM | POA: Diagnosis not present

## 2016-07-25 DIAGNOSIS — E0840 Diabetes mellitus due to underlying condition with diabetic neuropathy, unspecified: Secondary | ICD-10-CM | POA: Diagnosis not present

## 2016-07-25 DIAGNOSIS — I110 Hypertensive heart disease with heart failure: Secondary | ICD-10-CM | POA: Diagnosis not present

## 2016-10-03 ENCOUNTER — Ambulatory Visit (INDEPENDENT_AMBULATORY_CARE_PROVIDER_SITE_OTHER)
Admission: EM | Admit: 2016-10-03 | Discharge: 2016-10-03 | Disposition: A | Discharge status: Nursing Home (Includes ICF) | Payer: Commercial Managed Care - HMO | Source: Home / Self Care | Attending: Emergency Medicine | Admitting: Emergency Medicine

## 2016-10-03 ENCOUNTER — Encounter (HOSPITAL_COMMUNITY): Payer: Self-pay | Admitting: Emergency Medicine

## 2016-10-03 ENCOUNTER — Emergency Department (HOSPITAL_COMMUNITY): Payer: Commercial Managed Care - HMO

## 2016-10-03 DIAGNOSIS — Z8049 Family history of malignant neoplasm of other genital organs: Secondary | ICD-10-CM

## 2016-10-03 DIAGNOSIS — E1169 Type 2 diabetes mellitus with other specified complication: Principal | ICD-10-CM | POA: Diagnosis present

## 2016-10-03 DIAGNOSIS — I10 Essential (primary) hypertension: Secondary | ICD-10-CM | POA: Diagnosis present

## 2016-10-03 DIAGNOSIS — Z885 Allergy status to narcotic agent status: Secondary | ICD-10-CM

## 2016-10-03 DIAGNOSIS — M6518 Other infective (teno)synovitis, other site: Secondary | ICD-10-CM | POA: Diagnosis not present

## 2016-10-03 DIAGNOSIS — E1140 Type 2 diabetes mellitus with diabetic neuropathy, unspecified: Secondary | ICD-10-CM | POA: Diagnosis present

## 2016-10-03 DIAGNOSIS — D473 Essential (hemorrhagic) thrombocythemia: Secondary | ICD-10-CM | POA: Diagnosis present

## 2016-10-03 DIAGNOSIS — M86241 Subacute osteomyelitis, right hand: Secondary | ICD-10-CM | POA: Diagnosis not present

## 2016-10-03 DIAGNOSIS — K219 Gastro-esophageal reflux disease without esophagitis: Secondary | ICD-10-CM | POA: Diagnosis present

## 2016-10-03 DIAGNOSIS — M869 Osteomyelitis, unspecified: Secondary | ICD-10-CM | POA: Diagnosis present

## 2016-10-03 DIAGNOSIS — Z87891 Personal history of nicotine dependence: Secondary | ICD-10-CM

## 2016-10-03 DIAGNOSIS — M86141 Other acute osteomyelitis, right hand: Secondary | ICD-10-CM | POA: Diagnosis present

## 2016-10-03 DIAGNOSIS — Z9641 Presence of insulin pump (external) (internal): Secondary | ICD-10-CM | POA: Diagnosis present

## 2016-10-03 DIAGNOSIS — L089 Local infection of the skin and subcutaneous tissue, unspecified: Secondary | ICD-10-CM

## 2016-10-03 DIAGNOSIS — Z89512 Acquired absence of left leg below knee: Secondary | ICD-10-CM

## 2016-10-03 DIAGNOSIS — D638 Anemia in other chronic diseases classified elsewhere: Secondary | ICD-10-CM | POA: Diagnosis present

## 2016-10-03 DIAGNOSIS — Z89511 Acquired absence of right leg below knee: Secondary | ICD-10-CM | POA: Diagnosis not present

## 2016-10-03 DIAGNOSIS — Z794 Long term (current) use of insulin: Secondary | ICD-10-CM

## 2016-10-03 DIAGNOSIS — E119 Type 2 diabetes mellitus without complications: Secondary | ICD-10-CM | POA: Diagnosis not present

## 2016-10-03 DIAGNOSIS — Z881 Allergy status to other antibiotic agents status: Secondary | ICD-10-CM

## 2016-10-03 DIAGNOSIS — M65841 Other synovitis and tenosynovitis, right hand: Secondary | ICD-10-CM | POA: Diagnosis not present

## 2016-10-03 DIAGNOSIS — M7989 Other specified soft tissue disorders: Secondary | ICD-10-CM | POA: Diagnosis not present

## 2016-10-03 DIAGNOSIS — L03011 Cellulitis of right finger: Secondary | ICD-10-CM | POA: Diagnosis not present

## 2016-10-03 LAB — BASIC METABOLIC PANEL
ANION GAP: 7 (ref 5–15)
BUN: 9 mg/dL (ref 6–20)
CHLORIDE: 109 mmol/L (ref 101–111)
CO2: 28 mmol/L (ref 22–32)
Calcium: 9.4 mg/dL (ref 8.9–10.3)
Creatinine, Ser: 0.76 mg/dL (ref 0.44–1.00)
GFR calc Af Amer: >60 mL/min (ref >60)
Glucose, Bld: 96 mg/dL (ref 65–99)
POTASSIUM: 4.3 mmol/L (ref 3.5–5.1)
Sodium: 144 mmol/L (ref 135–145)

## 2016-10-03 LAB — CBC WITH DIFFERENTIAL/PLATELET
BASOS ABS: 0 10*3/uL (ref 0.0–0.1)
Basophils Relative: 0 %
Eosinophils Absolute: 0.2 10*3/uL (ref 0.0–0.7)
Eosinophils Relative: 2 %
HCT: 35.5 % — ABNORMAL LOW (ref 36.0–46.0)
HEMOGLOBIN: 11.6 g/dL — AB (ref 12.0–15.0)
LYMPHS ABS: 2.7 10*3/uL (ref 0.7–4.0)
LYMPHS PCT: 29 %
MCH: 27.9 pg (ref 26.0–34.0)
MCHC: 32.7 g/dL (ref 30.0–36.0)
MCV: 85.3 fL (ref 78.0–100.0)
Monocytes Absolute: 0.8 10*3/uL (ref 0.1–1.0)
Monocytes Relative: 9 %
NEUTROS PCT: 60 %
Neutro Abs: 5.4 10*3/uL (ref 1.7–7.7)
PLATELETS: 401 10*3/uL — AB (ref 150–400)
RBC: 4.16 MIL/uL (ref 3.87–5.11)
RDW: 13.1 % (ref 11.5–15.5)
WBC: 9.1 10*3/uL (ref 4.0–10.5)

## 2016-10-03 NOTE — ED Triage Notes (Signed)
Pt. reports right distal middle finger infection with swelling and drainage onset this week after a manicure , denies fever or chills.

## 2016-10-03 NOTE — ED Provider Notes (Signed)
CSN: [id number]     Arrival date & time 10/03/16  1931 History   First MD Initiated Contact with Patient 10/03/16 2100     Chief Complaint  Patient presents with  . paronychia   (Consider location/radiation/quality/duration/timing/severity/associated sxs/prior Treatment) 65 yo black female with a past history of diabetes and osteomyelitis in the past presents with a right middle finger possible infection. Started in the nail and has spread to the whole finger. 3 days of worsening redness and swelling. No fevers are noted.       Past Medical History:  Diagnosis Date  . Cellulitis of foot, left 10/24/2014   hx/notes 10/24/2014  . Charcot foot due to diabetes mellitus ([place])   . Osteomyelitis of right foot ([place])    hx/notes 10/24/2014  . Type II diabetes mellitus ([place])    Past Surgical History:  Procedure Laterality Date  . ABOVE KNEE LEG AMPUTATION Right 2004  . AMPUTATION Left 10/29/2014   Procedure: AMPUTATION BELOW KNEE - LEFT;  Surgeon: Newt Minion, MD;  Location: [facility];  Service: Orthopedics;  Laterality: Left;  . FOOT SURGERY Left 1980's   "ulcer removed"  . REDUCTION MAMMAPLASTY Bilateral 1987  . TOE AMPUTATION Left ~ 2011   "top of my 3rd toe"  . TUBAL LIGATION  1973   Family History  Problem Relation Age of Onset  . Uterine cancer Mother   . Gout Father   . Thyroid disease Sister    Social History  Substance Use Topics  . Smoking status: Former Smoker    Packs/day: 0.50    Years: 30.00    Types: Cigarettes  . Smokeless tobacco: Never Used     Comment: "quit smoking ~ 2000"  . Alcohol use No   OB History    No data available     Review of Systems  Constitutional: Negative for fever.    Allergies  Clindamycin/lincomycin and Codeine  Home Medications   Prior to Admission medications   Medication Sig Start Date End Date Taking? Authorizing Provider  amLODipine (NORVASC) 10 MG tablet Take 10 mg by mouth daily.   Yes Historical Provider, MD   amLODipine (NORVASC) 5 MG tablet Take 1 tablet (5 mg total) by mouth daily. 10/31/14  Yes Shanker Kristeen Mans, MD  Dulaglutide (TRULICITY) 1.5 0000000 SOPN Inject 1.5 mg into the skin once a week.   Yes Historical Provider, MD  famotidine (PEPCID) 20 MG tablet Take 20 mg by mouth daily.  09/29/14  Yes Historical Provider, MD  Insulin Degludec (TRESIBA FLEXTOUCH) 100 UNIT/ML SOPN Inject 17 Units into the skin daily.   Yes Historical Provider, MD  lisinopril (PRINIVIL,ZESTRIL) 10 MG tablet Take 1 tablet (10 mg total) by mouth daily. 11/02/14  Yes Kelvin Cellar, MD  metFORMIN (GLUCOPHAGE) 1000 MG tablet Take 1,000 mg by mouth 2 (two) times daily with a meal.   Yes Historical Provider, MD  cyclobenzaprine (FLEXERIL) 10 MG tablet Take 1 tablet (10 mg total) by mouth 2 (two) times daily as needed for muscle spasms. 07/31/15   Blanchie Dessert, MD  HYDROcodone-acetaminophen (NORCO/VICODIN) 5-325 MG per tablet Take 1-2 tablets by mouth every 6 (six) hours as needed. 07/31/15   Blanchie Dessert, MD  insulin aspart (NOVOLOG) 100 UNIT/ML injection 0-15 Units, Subcutaneous, 3 times daily with meals CBG < 70: implement hypoglycemia protocol CBG 70 - 120: 0 units CBG 121 - 150: 2 units CBG 151 - 200: 3 units CBG 201 - 250: 5 units CBG 251 - 300: 8 units  CBG 301 - 350: 11 units CBG 351 - 400: 15 units CBG > 400: call MD Patient not taking: Reported on 07/31/2015 10/31/14   Shanker Kristeen Mans, MD  Insulin Glargine (TOUJEO SOLOSTAR) 300 UNIT/ML SOPN Inject 22 Units into the skin at bedtime. Patient not taking: Reported on 07/31/2015 10/31/14   Jonetta Osgood, MD  Insulin Human (INSULIN PUMP) SOLN Inject 1 each into the skin continuous.    Historical Provider, MD  lisinopril (PRINIVIL,ZESTRIL) 5 MG tablet Take 5 mg by mouth daily.    Historical Provider, MD  metoCLOPramide (REGLAN) 10 MG tablet Take 1 tablet (10 mg total) by mouth 3 (three) times daily before meals. Patient not taking: Reported on 02/15/2015  11/02/14   Kelvin Cellar, MD  ondansetron (ZOFRAN) 4 MG tablet Take 1 tablet (4 mg total) by mouth every 6 (six) hours as needed for nausea. Patient not taking: Reported on 07/31/2015 10/31/14   Jonetta Osgood, MD  oxyCODONE-acetaminophen (PERCOCET/ROXICET) 5-325 MG per tablet Take 1-2 tablets by mouth every 6 (six) hours as needed for moderate pain or severe pain. Patient not taking: Reported on 07/31/2015 10/31/14   Jonetta Osgood, MD  polyethylene glycol (MIRALAX / Floria Raveling) packet Take 17 g by mouth daily. Patient not taking: Reported on 07/31/2015 10/31/14   Jonetta Osgood, MD  senna-docusate (SENOKOT-S) 8.6-50 MG per tablet Take 2 tablets by mouth 2 (two) times daily. Patient not taking: Reported on 07/31/2015 10/31/14   Jonetta Osgood, MD   Meds Ordered and Administered this Visit  Medications - No data to display  BP 158/84 (BP Location: Left Arm)   Pulse 86   Temp 98.4 F (36.9 C) (Oral)   Resp 14   SpO2 99%  No data found.   Physical Exam  Constitutional: She appears well-developed and well-nourished. No distress.  Musculoskeletal:  Right 3rd finger with swelling along the tip to base of finger, pad swelling, erythema and warmth. nail raised with underlying abscess, pain with ROM of the tip and PIP. Tight ring to base  Skin: She is not diaphoretic.  Psychiatric: Her behavior is normal.  Nursing note and vitals reviewed.   Urgent Care Course   Clinical Course    Procedures (including critical care time)  Labs Review Labs Reviewed - No data to display  Imaging Review No results found.   Visual Acuity Review  Right Eye Distance:   Left Eye Distance:   Bilateral Distance:    Right Eye Near:   Left Eye Near:    Bilateral Near:         MDM   1. Infection of index finger    Finger infection in a diabetic with probable abscess and underlying osteomyelitis. Send to ED for further imaging, possible debridement and questionable IV antibiotics.     Bjorn Pippin, PA-C 10/03/16 2136

## 2016-10-03 NOTE — ED Triage Notes (Signed)
The patient presented to the [facility] with a complaint of a possible infection to her right index finger secondary to having her nails done 3 months ago.

## 2016-10-04 ENCOUNTER — Encounter (HOSPITAL_COMMUNITY)
Admission: EM | Disposition: A | Discharge status: Home / Self Care | Payer: Self-pay | Source: Home / Self Care | Attending: Internal Medicine

## 2016-10-04 ENCOUNTER — Inpatient Hospital Stay (HOSPITAL_COMMUNITY)
Admission: EM | Admit: 2016-10-04 | Discharge: 2016-10-05 | DRG: 982 | Disposition: A | Discharge status: Home / Self Care | Payer: Commercial Managed Care - HMO | Attending: Internal Medicine | Admitting: Internal Medicine

## 2016-10-04 ENCOUNTER — Encounter (HOSPITAL_COMMUNITY): Payer: Self-pay | Admitting: Family Medicine

## 2016-10-04 ENCOUNTER — Observation Stay (HOSPITAL_COMMUNITY): Payer: Commercial Managed Care - HMO | Admitting: Anesthesiology

## 2016-10-04 DIAGNOSIS — Z89512 Acquired absence of left leg below knee: Secondary | ICD-10-CM | POA: Diagnosis not present

## 2016-10-04 DIAGNOSIS — M86241 Subacute osteomyelitis, right hand: Secondary | ICD-10-CM | POA: Diagnosis not present

## 2016-10-04 DIAGNOSIS — E1140 Type 2 diabetes mellitus with diabetic neuropathy, unspecified: Secondary | ICD-10-CM | POA: Diagnosis present

## 2016-10-04 DIAGNOSIS — D638 Anemia in other chronic diseases classified elsewhere: Secondary | ICD-10-CM | POA: Diagnosis present

## 2016-10-04 DIAGNOSIS — E1169 Type 2 diabetes mellitus with other specified complication: Secondary | ICD-10-CM | POA: Diagnosis present

## 2016-10-04 DIAGNOSIS — Z885 Allergy status to narcotic agent status: Secondary | ICD-10-CM | POA: Diagnosis not present

## 2016-10-04 DIAGNOSIS — Z794 Long term (current) use of insulin: Secondary | ICD-10-CM

## 2016-10-04 DIAGNOSIS — K219 Gastro-esophageal reflux disease without esophagitis: Secondary | ICD-10-CM | POA: Diagnosis present

## 2016-10-04 DIAGNOSIS — I10 Essential (primary) hypertension: Secondary | ICD-10-CM | POA: Diagnosis present

## 2016-10-04 DIAGNOSIS — L089 Local infection of the skin and subcutaneous tissue, unspecified: Secondary | ICD-10-CM

## 2016-10-04 DIAGNOSIS — Z9641 Presence of insulin pump (external) (internal): Secondary | ICD-10-CM | POA: Diagnosis present

## 2016-10-04 DIAGNOSIS — E119 Type 2 diabetes mellitus without complications: Secondary | ICD-10-CM | POA: Diagnosis not present

## 2016-10-04 DIAGNOSIS — IMO0001 Reserved for inherently not codable concepts without codable children: Secondary | ICD-10-CM

## 2016-10-04 DIAGNOSIS — E1159 Type 2 diabetes mellitus with other circulatory complications: Secondary | ICD-10-CM | POA: Diagnosis present

## 2016-10-04 DIAGNOSIS — M869 Osteomyelitis, unspecified: Secondary | ICD-10-CM | POA: Diagnosis present

## 2016-10-04 DIAGNOSIS — Z87891 Personal history of nicotine dependence: Secondary | ICD-10-CM | POA: Diagnosis not present

## 2016-10-04 DIAGNOSIS — Z89511 Acquired absence of right leg below knee: Secondary | ICD-10-CM | POA: Diagnosis not present

## 2016-10-04 DIAGNOSIS — Z881 Allergy status to other antibiotic agents status: Secondary | ICD-10-CM | POA: Diagnosis not present

## 2016-10-04 DIAGNOSIS — D649 Anemia, unspecified: Secondary | ICD-10-CM | POA: Diagnosis present

## 2016-10-04 DIAGNOSIS — Z8049 Family history of malignant neoplasm of other genital organs: Secondary | ICD-10-CM | POA: Diagnosis not present

## 2016-10-04 DIAGNOSIS — M86141 Other acute osteomyelitis, right hand: Secondary | ICD-10-CM | POA: Diagnosis present

## 2016-10-04 DIAGNOSIS — D473 Essential (hemorrhagic) thrombocythemia: Secondary | ICD-10-CM | POA: Diagnosis present

## 2016-10-04 HISTORY — PX: I & D EXTREMITY: SHX5045

## 2016-10-04 LAB — GLUCOSE, CAPILLARY
GLUCOSE-CAPILLARY: 98 mg/dL (ref 65–99)
Glucose-Capillary: 97 mg/dL (ref 65–99)
Glucose-Capillary: 167 mg/dL — ABNORMAL HIGH (ref 65–99)
Glucose-Capillary: 110 mg/dL — ABNORMAL HIGH (ref 65–99)
Glucose-Capillary: 97 mg/dL (ref 65–99)
Glucose-Capillary: 233 mg/dL — ABNORMAL HIGH (ref 65–99)

## 2016-10-04 LAB — IRON AND TIBC
IRON: 21 ug/dL — AB (ref 28–170)
Saturation Ratios: 11 % (ref 10.4–31.8)
TIBC: 188 ug/dL — AB (ref 250–450)
UIBC: 167 ug/dL

## 2016-10-04 LAB — FERRITIN: Ferritin: 182 ng/mL (ref 11–307)

## 2016-10-04 LAB — SEDIMENTATION RATE: Sed Rate: 62 mm/hr — ABNORMAL HIGH (ref 0–22)

## 2016-10-04 LAB — SURGICAL PCR SCREEN
MRSA, PCR: NEGATIVE
STAPHYLOCOCCUS AUREUS: POSITIVE — AB

## 2016-10-04 LAB — C-REACTIVE PROTEIN: CRP: 2 mg/dL — ABNORMAL HIGH (ref <1.0)

## 2016-10-04 LAB — VITAMIN B12: VITAMIN B 12: 205 pg/mL (ref 180–914)

## 2016-10-04 SURGERY — IRRIGATION AND DEBRIDEMENT EXTREMITY
Anesthesia: General | Site: Finger | Laterality: Right

## 2016-10-04 MED ORDER — ONDANSETRON HCL 4 MG/2ML IJ SOLN: 4.0000 mg | Freq: Once | INTRAMUSCULAR | Status: DC | PRN

## 2016-10-04 MED ORDER — CHLORHEXIDINE GLUCONATE 4 % EX LIQD
60.0000 mL | Freq: Once | CUTANEOUS | Status: DC
  Filled 2016-10-04: qty 60

## 2016-10-04 MED ORDER — BUPIVACAINE HCL (PF) 0.25 % IJ SOLN
INTRAMUSCULAR | Status: AC
  Filled 2016-10-04: qty 30

## 2016-10-04 MED ORDER — HYDRALAZINE HCL 20 MG/ML IJ SOLN: 10.0000 mg | INTRAMUSCULAR | Status: DC | PRN

## 2016-10-04 MED ORDER — ONDANSETRON HCL 4 MG PO TABS: 4.0000 mg | ORAL_TABLET | Freq: Four times a day (QID) | ORAL | Status: DC | PRN

## 2016-10-04 MED ORDER — VANCOMYCIN HCL IN DEXTROSE 1-5 GM/200ML-% IV SOLN
1000.0000 mg | Freq: Once | INTRAVENOUS | Status: AC
  Administered 2016-10-04: 1000 mg via INTRAVENOUS
  Filled 2016-10-04: qty 200

## 2016-10-04 MED ORDER — AMLODIPINE BESYLATE 10 MG PO TABS
10.0000 mg | ORAL_TABLET | Freq: Every day | ORAL | Status: DC
  Administered 2016-10-04: 10 mg via ORAL
  Filled 2016-10-04: qty 1

## 2016-10-04 MED ORDER — LACTATED RINGERS IV SOLN
INTRAVENOUS | Status: DC | PRN
  Administered 2016-10-04: 12:00:00 via INTRAVENOUS

## 2016-10-04 MED ORDER — INSULIN ASPART 100 UNIT/ML ~~LOC~~ SOLN: 0.0000 [IU] | Freq: Three times a day (TID) | SUBCUTANEOUS | Status: DC

## 2016-10-04 MED ORDER — PIPERACILLIN-TAZOBACTAM 3.375 G IVPB
3.3750 g | Freq: Three times a day (TID) | INTRAVENOUS | Status: DC
  Administered 2016-10-04 – 2016-10-05 (×4): 3.375 g via INTRAVENOUS
  Filled 2016-10-04 (×8): qty 50

## 2016-10-04 MED ORDER — SODIUM CHLORIDE 0.9% FLUSH: 3.0000 mL | INTRAVENOUS | Status: DC | PRN

## 2016-10-04 MED ORDER — INSULIN PUMP
1.0000 | SUBCUTANEOUS | Status: DC
  Filled 2016-10-04: qty 1

## 2016-10-04 MED ORDER — ACETAMINOPHEN 325 MG PO TABS: 650.0000 mg | ORAL_TABLET | Freq: Four times a day (QID) | ORAL | Status: DC | PRN

## 2016-10-04 MED ORDER — PROPOFOL 10 MG/ML IV BOLUS
INTRAVENOUS | Status: DC | PRN
  Administered 2016-10-04: 150 mg via INTRAVENOUS

## 2016-10-04 MED ORDER — SUCCINYLCHOLINE CHLORIDE 20 MG/ML IJ SOLN
INTRAMUSCULAR | Status: DC | PRN
  Administered 2016-10-04: 100 mg via INTRAVENOUS

## 2016-10-04 MED ORDER — PHENYLEPHRINE 40 MCG/ML (10ML) SYRINGE FOR IV PUSH (FOR BLOOD PRESSURE SUPPORT)
PREFILLED_SYRINGE | INTRAVENOUS | Status: AC
  Filled 2016-10-04: qty 10

## 2016-10-04 MED ORDER — HYDROCODONE-ACETAMINOPHEN 5-325 MG PO TABS: 1.0000 | ORAL_TABLET | Freq: Four times a day (QID) | ORAL | Status: DC | PRN

## 2016-10-04 MED ORDER — HEPARIN SODIUM (PORCINE) 5000 UNIT/ML IJ SOLN
5000.0000 [IU] | Freq: Three times a day (TID) | INTRAMUSCULAR | Status: DC
  Administered 2016-10-04 – 2016-10-05 (×3): 5000 [IU] via SUBCUTANEOUS
  Filled 2016-10-04 (×2): qty 1

## 2016-10-04 MED ORDER — ONDANSETRON HCL 4 MG/2ML IJ SOLN
INTRAMUSCULAR | Status: DC | PRN
  Administered 2016-10-04: 4 mg via INTRAVENOUS

## 2016-10-04 MED ORDER — FENTANYL CITRATE (PF) 100 MCG/2ML IJ SOLN
INTRAMUSCULAR | Status: AC
  Filled 2016-10-04: qty 2

## 2016-10-04 MED ORDER — INSULIN ASPART 100 UNIT/ML ~~LOC~~ SOLN: 0.0000 [IU] | SUBCUTANEOUS | Status: DC

## 2016-10-04 MED ORDER — BISACODYL 5 MG PO TBEC
5.0000 mg | DELAYED_RELEASE_TABLET | Freq: Every day | ORAL | Status: DC | PRN
Start: 2016-10-04 — End: 2016-10-05

## 2016-10-04 MED ORDER — INSULIN ASPART 100 UNIT/ML ~~LOC~~ SOLN
0.0000 [IU] | Freq: Three times a day (TID) | SUBCUTANEOUS | Status: DC
  Administered 2016-10-05: 2 [IU] via SUBCUTANEOUS
  Administered 2016-10-05: 5 [IU] via SUBCUTANEOUS

## 2016-10-04 MED ORDER — VANCOMYCIN HCL 10 G IV SOLR
1250.0000 mg | Freq: Two times a day (BID) | INTRAVENOUS | Status: DC
  Administered 2016-10-04 – 2016-10-05 (×3): 1250 mg via INTRAVENOUS
  Filled 2016-10-04 (×4): qty 1250

## 2016-10-04 MED ORDER — SODIUM CHLORIDE 0.9% FLUSH
3.0000 mL | Freq: Two times a day (BID) | INTRAVENOUS | Status: DC
  Administered 2016-10-04: 3 mL via INTRAVENOUS

## 2016-10-04 MED ORDER — FENTANYL CITRATE (PF) 100 MCG/2ML IJ SOLN: 25.0000 ug | INTRAMUSCULAR | Status: DC | PRN

## 2016-10-04 MED ORDER — ACETAMINOPHEN 650 MG RE SUPP: 650.0000 mg | Freq: Four times a day (QID) | RECTAL | Status: DC | PRN

## 2016-10-04 MED ORDER — SODIUM CHLORIDE 0.9 % IR SOLN
Status: DC | PRN
Start: 2016-10-04 — End: 2016-10-04
  Administered 2016-10-04: 1000 mL

## 2016-10-04 MED ORDER — ONDANSETRON HCL 4 MG/2ML IJ SOLN
INTRAMUSCULAR | Status: AC
  Filled 2016-10-04: qty 2

## 2016-10-04 MED ORDER — POVIDONE-IODINE 10 % EX SWAB: 2.0000 "application " | Freq: Once | CUTANEOUS | Status: DC

## 2016-10-04 MED ORDER — SUCCINYLCHOLINE CHLORIDE 200 MG/10ML IV SOSY
PREFILLED_SYRINGE | INTRAVENOUS | Status: AC
  Filled 2016-10-04: qty 10

## 2016-10-04 MED ORDER — POLYETHYLENE GLYCOL 3350 17 G PO PACK: 17.0000 g | PACK | Freq: Every day | ORAL | Status: DC | PRN

## 2016-10-04 MED ORDER — LIDOCAINE HCL (CARDIAC) 20 MG/ML IV SOLN
INTRAVENOUS | Status: DC | PRN
  Administered 2016-10-04: 4 mL via INTRATRACHEAL

## 2016-10-04 MED ORDER — AMLODIPINE BESYLATE 5 MG PO TABS
10.0000 mg | ORAL_TABLET | Freq: Every day | ORAL | Status: DC
  Filled 2016-10-04: qty 2

## 2016-10-04 MED ORDER — FENTANYL CITRATE (PF) 100 MCG/2ML IJ SOLN
INTRAMUSCULAR | Status: DC | PRN
  Administered 2016-10-04 (×2): 50 ug via INTRAVENOUS

## 2016-10-04 MED ORDER — PHENYLEPHRINE HCL 10 MG/ML IJ SOLN
INTRAMUSCULAR | Status: DC | PRN
  Administered 2016-10-04: 80 ug via INTRAVENOUS

## 2016-10-04 MED ORDER — FAMOTIDINE 20 MG PO TABS
20.0000 mg | ORAL_TABLET | Freq: Every day | ORAL | Status: DC
  Filled 2016-10-04: qty 1

## 2016-10-04 MED ORDER — PIPERACILLIN-TAZOBACTAM 3.375 G IVPB
3.3750 g | Freq: Once | INTRAVENOUS | Status: AC
  Administered 2016-10-04: 3.375 g via INTRAVENOUS
  Filled 2016-10-04 (×2): qty 50

## 2016-10-04 MED ORDER — BUPIVACAINE HCL (PF) 0.25 % IJ SOLN
INTRAMUSCULAR | Status: DC | PRN
  Administered 2016-10-04: 30 mL

## 2016-10-04 MED ORDER — LACTATED RINGERS IV SOLN
INTRAVENOUS | Status: DC
  Administered 2016-10-04: 12:00:00 via INTRAVENOUS

## 2016-10-04 MED ORDER — LIDOCAINE 2% (20 MG/ML) 5 ML SYRINGE
INTRAMUSCULAR | Status: AC
  Filled 2016-10-04: qty 5

## 2016-10-04 MED ORDER — ONDANSETRON HCL 4 MG/2ML IJ SOLN: 4.0000 mg | Freq: Four times a day (QID) | INTRAMUSCULAR | Status: DC | PRN

## 2016-10-04 MED ORDER — SODIUM CHLORIDE 0.9 % IV SOLN: 250.0000 mL | INTRAVENOUS | Status: DC | PRN

## 2016-10-04 MED ORDER — FAMOTIDINE 20 MG PO TABS
20.0000 mg | ORAL_TABLET | Freq: Every day | ORAL | Status: DC
  Administered 2016-10-04: 20 mg via ORAL
  Filled 2016-10-04: qty 1

## 2016-10-04 MED ORDER — CYCLOBENZAPRINE HCL 10 MG PO TABS: 10.0000 mg | ORAL_TABLET | Freq: Two times a day (BID) | ORAL | Status: DC | PRN

## 2016-10-04 MED ORDER — LISINOPRIL 5 MG PO TABS
5.0000 mg | ORAL_TABLET | Freq: Every day | ORAL | Status: DC
  Administered 2016-10-04 – 2016-10-05 (×2): 5 mg via ORAL
  Filled 2016-10-04 (×3): qty 1

## 2016-10-04 SURGICAL SUPPLY — 57 items
BANDAGE ACE 4X5 VEL STRL LF (GAUZE/BANDAGES/DRESSINGS) ×3 IMPLANT
BANDAGE ELASTIC 3 VELCRO ST LF (GAUZE/BANDAGES/DRESSINGS) ×3 IMPLANT
BNDG COHESIVE 1X5 TAN STRL LF (GAUZE/BANDAGES/DRESSINGS) ×3 IMPLANT
BNDG CONFORM 2 STRL LF (GAUZE/BANDAGES/DRESSINGS) ×3 IMPLANT
BNDG CONFORM 3 STRL LF (GAUZE/BANDAGES/DRESSINGS) ×3 IMPLANT
BNDG ELASTIC 2 VLCR STRL LF (GAUZE/BANDAGES/DRESSINGS) ×3 IMPLANT
BNDG ESMARK 4X9 LF (GAUZE/BANDAGES/DRESSINGS) ×3 IMPLANT
BNDG GAUZE ELAST 4 BULKY (GAUZE/BANDAGES/DRESSINGS) ×3 IMPLANT
CORDS BIPOLAR (ELECTRODE) ×3 IMPLANT
COVER SURGICAL LIGHT HANDLE (MISCELLANEOUS) ×3 IMPLANT
CUFF TOURNIQUET SINGLE 18IN (TOURNIQUET CUFF) ×3 IMPLANT
CUFF TOURNIQUET SINGLE 24IN (TOURNIQUET CUFF) IMPLANT
DRAIN PENROSE 1/4X12 LTX STRL (WOUND CARE) IMPLANT
DRAPE SURG 17X23 STRL (DRAPES) ×3 IMPLANT
DRSG ADAPTIC 3X8 NADH LF (GAUZE/BANDAGES/DRESSINGS) ×3 IMPLANT
ELECT REM PT RETURN 9FT ADLT (ELECTROSURGICAL)
ELECTRODE REM PT RTRN 9FT ADLT (ELECTROSURGICAL) IMPLANT
GAUZE SPONGE 4X4 12PLY STRL (GAUZE/BANDAGES/DRESSINGS) ×3 IMPLANT
GAUZE XEROFORM 1X8 LF (GAUZE/BANDAGES/DRESSINGS) ×3 IMPLANT
GAUZE XEROFORM 5X9 LF (GAUZE/BANDAGES/DRESSINGS) IMPLANT
GLOVE BIOGEL PI IND STRL 8.5 (GLOVE) ×1 IMPLANT
GLOVE BIOGEL PI INDICATOR 8.5 (GLOVE) ×2
GLOVE SURG ORTHO 8.0 STRL STRW (GLOVE) ×3 IMPLANT
GOWN STRL REUS W/ TWL LRG LVL3 (GOWN DISPOSABLE) ×3 IMPLANT
GOWN STRL REUS W/ TWL XL LVL3 (GOWN DISPOSABLE) ×1 IMPLANT
GOWN STRL REUS W/TWL LRG LVL3 (GOWN DISPOSABLE) ×6
GOWN STRL REUS W/TWL XL LVL3 (GOWN DISPOSABLE) ×2
HANDPIECE INTERPULSE COAX TIP (DISPOSABLE)
KIT BASIN OR (CUSTOM PROCEDURE TRAY) ×3 IMPLANT
KIT ROOM TURNOVER OR (KITS) ×3 IMPLANT
MANIFOLD NEPTUNE II (INSTRUMENTS) ×3 IMPLANT
NEEDLE HYPO 25GX1X1/2 BEV (NEEDLE) IMPLANT
NS IRRIG 1000ML POUR BTL (IV SOLUTION) ×3 IMPLANT
PACK ORTHO EXTREMITY (CUSTOM PROCEDURE TRAY) ×3 IMPLANT
PAD ARMBOARD 7.5X6 YLW CONV (MISCELLANEOUS) ×6 IMPLANT
PAD CAST 4YDX4 CTTN HI CHSV (CAST SUPPLIES) ×1 IMPLANT
PADDING CAST COTTON 4X4 STRL (CAST SUPPLIES) ×2
SET HNDPC FAN SPRY TIP SCT (DISPOSABLE) IMPLANT
SOAP 2 % CHG 4 OZ (WOUND CARE) ×3 IMPLANT
SPONGE GAUZE 4X4 12PLY STER LF (GAUZE/BANDAGES/DRESSINGS) ×3 IMPLANT
SPONGE LAP 18X18 X RAY DECT (DISPOSABLE) ×3 IMPLANT
SPONGE LAP 4X18 X RAY DECT (DISPOSABLE) ×3 IMPLANT
SUCTION FRAZIER HANDLE 10FR (MISCELLANEOUS) ×2
SUCTION TUBE FRAZIER 10FR DISP (MISCELLANEOUS) ×1 IMPLANT
SUT ETHILON 4 0 PS 2 18 (SUTURE) IMPLANT
SUT ETHILON 5 0 P 3 18 (SUTURE)
SUT NYLON ETHILON 5-0 P-3 1X18 (SUTURE) IMPLANT
SUT PROLENE 4 0 P 3 18 (SUTURE) ×6 IMPLANT
SYR CONTROL 10ML LL (SYRINGE) IMPLANT
TOWEL OR 17X24 6PK STRL BLUE (TOWEL DISPOSABLE) ×3 IMPLANT
TOWEL OR 17X26 10 PK STRL BLUE (TOWEL DISPOSABLE) ×3 IMPLANT
TUBE ANAEROBIC SPECIMEN COL (MISCELLANEOUS) IMPLANT
TUBE CONNECTING 12'X1/4 (SUCTIONS) ×1
TUBE CONNECTING 12X1/4 (SUCTIONS) ×2 IMPLANT
UNDERPAD 30X30 (UNDERPADS AND DIAPERS) ×3 IMPLANT
WATER STERILE IRR 1000ML POUR (IV SOLUTION) ×3 IMPLANT
YANKAUER SUCT BULB TIP NO VENT (SUCTIONS) ×3 IMPLANT

## 2016-10-04 NOTE — ED Provider Notes (Signed)
[facility] DEPT Provider Note   CSN: [id number] Arrival date & time: 10/03/16  2137   By signing my name below, I, Eunice Blase, attest that this documentation has been prepared under the direction and in the presence of Veryl Speak, MD. Electronically signed, Eunice Blase, ED Scribe. 10/04/16. 2:16 AM.   History   Chief Complaint Chief Complaint  Patient presents with  . Finger Infection   The history is provided by the patient. No language interpreter was used.   HPI Comments: Diana Coleman is a 65 y.o. female with PMHx of IDDM who presents to the Emergency Department complaining of gradual worsening right middle finger swelling x 2 days.  She reports associated appetite change. No exacerbating or alleviating factors reported. The pt reports that her current symptoms began 3 months ago after getting her nails done. She denies fever or pain to the finger.   Past Medical History:  Diagnosis Date  . Cellulitis of foot, left 10/24/2014   hx/notes 10/24/2014  . Charcot foot due to diabetes mellitus ([place])   . Osteomyelitis of right foot ([place])    hx/notes 10/24/2014  . Type II diabetes mellitus [facility])     Patient Active Problem List   Diagnosis Date Noted  . Gastroparesis 11/02/2014  . Diabetes mellitus due to underlying condition with other specified complication ([place])   . Diabetic foot ([place])   . Nausea & vomiting   . Essential hypertension 10/25/2014  . GERD (gastroesophageal reflux disease) 10/25/2014  . Cellulitis 10/24/2014  . Diabetes ([place]) 10/24/2014  . Charcot foot due to diabetes mellitus ([place]) 10/24/2014    Past Surgical History:  Procedure Laterality Date  . ABOVE KNEE LEG AMPUTATION Right 2004  . AMPUTATION Left 10/29/2014   Procedure: AMPUTATION BELOW KNEE - LEFT;  Surgeon: Newt Minion, MD;  Location: [facility];  Service: Orthopedics;  Laterality: Left;  . FOOT SURGERY Left 1980's   "ulcer removed"  . REDUCTION MAMMAPLASTY Bilateral 1987  . TOE  AMPUTATION Left ~ 2011   "top of my 3rd toe"  . TUBAL LIGATION  1973    OB History    No data available       Home Medications    Prior to Admission medications   Medication Sig Start Date End Date Taking? Authorizing Provider  amLODipine (NORVASC) 10 MG tablet Take 10 mg by mouth daily.    Historical Provider, MD  amLODipine (NORVASC) 5 MG tablet Take 1 tablet (5 mg total) by mouth daily. 10/31/14   Shanker Kristeen Mans, MD  cyclobenzaprine (FLEXERIL) 10 MG tablet Take 1 tablet (10 mg total) by mouth 2 (two) times daily as needed for muscle spasms. 07/31/15   Blanchie Dessert, MD  Dulaglutide (TRULICITY) 1.5 0000000 SOPN Inject 1.5 mg into the skin once a week.    Historical Provider, MD  famotidine (PEPCID) 20 MG tablet Take 20 mg by mouth daily.  09/29/14   Historical Provider, MD  HYDROcodone-acetaminophen (NORCO/VICODIN) 5-325 MG per tablet Take 1-2 tablets by mouth every 6 (six) hours as needed. 07/31/15   Blanchie Dessert, MD  insulin aspart (NOVOLOG) 100 UNIT/ML injection 0-15 Units, Subcutaneous, 3 times daily with meals CBG < 70: implement hypoglycemia protocol CBG 70 - 120: 0 units CBG 121 - 150: 2 units CBG 151 - 200: 3 units CBG 201 - 250: 5 units CBG 251 - 300: 8 units CBG 301 - 350: 11 units CBG 351 - 400: 15 units CBG > 400: call MD Patient not taking: Reported  on 07/31/2015 10/31/14   Jonetta Osgood, MD  Insulin Degludec (TRESIBA FLEXTOUCH) 100 UNIT/ML SOPN Inject 17 Units into the skin daily.    Historical Provider, MD  Insulin Glargine (TOUJEO SOLOSTAR) 300 UNIT/ML SOPN Inject 22 Units into the skin at bedtime. Patient not taking: Reported on 07/31/2015 10/31/14   Jonetta Osgood, MD  Insulin Human (INSULIN PUMP) SOLN Inject 1 each into the skin continuous.    Historical Provider, MD  lisinopril (PRINIVIL,ZESTRIL) 10 MG tablet Take 1 tablet (10 mg total) by mouth daily. 11/02/14   Kelvin Cellar, MD  lisinopril (PRINIVIL,ZESTRIL) 5 MG tablet Take 5 mg by mouth  daily.    Historical Provider, MD  metFORMIN (GLUCOPHAGE) 1000 MG tablet Take 1,000 mg by mouth 2 (two) times daily with a meal.    Historical Provider, MD  metoCLOPramide (REGLAN) 10 MG tablet Take 1 tablet (10 mg total) by mouth 3 (three) times daily before meals. Patient not taking: Reported on 02/15/2015 11/02/14   Kelvin Cellar, MD  ondansetron (ZOFRAN) 4 MG tablet Take 1 tablet (4 mg total) by mouth every 6 (six) hours as needed for nausea. Patient not taking: Reported on 07/31/2015 10/31/14   Jonetta Osgood, MD  oxyCODONE-acetaminophen (PERCOCET/ROXICET) 5-325 MG per tablet Take 1-2 tablets by mouth every 6 (six) hours as needed for moderate pain or severe pain. Patient not taking: Reported on 07/31/2015 10/31/14   Jonetta Osgood, MD  polyethylene glycol (MIRALAX / Floria Raveling) packet Take 17 g by mouth daily. Patient not taking: Reported on 07/31/2015 10/31/14   Jonetta Osgood, MD  senna-docusate (SENOKOT-S) 8.6-50 MG per tablet Take 2 tablets by mouth 2 (two) times daily. Patient not taking: Reported on 07/31/2015 10/31/14   Jonetta Osgood, MD    Family History Family History  Problem Relation Age of Onset  . Uterine cancer Mother   . Gout Father   . Thyroid disease Sister     Social History Social History  Substance Use Topics  . Smoking status: Former Smoker    Packs/day: 0.50    Years: 30.00    Types: Cigarettes  . Smokeless tobacco: Never Used     Comment: "quit smoking ~ 2000"  . Alcohol use No     Allergies   Clindamycin/lincomycin and Codeine   Review of Systems Review of Systems  Constitutional: Positive for appetite change (decreased appetite). Negative for fever.  Musculoskeletal: Positive for joint swelling. Negative for arthralgias and myalgias.  All other systems reviewed and are negative.    Physical Exam Updated Vital Signs BP 153/81 (BP Location: Right Arm)   Pulse 79   Temp 98.8 F (37.1 C)   Resp 18   SpO2 99%   Physical Exam    Constitutional: She is oriented to person, place, and time. She appears well-developed and well-nourished. No distress.  HENT:  Head: Normocephalic and atraumatic.  Eyes: EOM are normal.  Neck: Normal range of motion.  Cardiovascular: Normal rate, regular rhythm and normal heart sounds.   Pulmonary/Chest: Effort normal and breath sounds normal.  Abdominal: Soft. She exhibits no distension. There is no tenderness.  Musculoskeletal: Normal range of motion. She exhibits edema. She exhibits no tenderness.  The right middle finger is noted to be markedly swollen with purulent drainage to the distal phalanx.  Neurological: She is alert and oriented to person, place, and time.  Skin: Skin is warm and dry.  Psychiatric: She has a normal mood and affect. Judgment normal.  Nursing note and  vitals reviewed.    ED Treatments / Results   DIAGNOSTIC STUDIES: Oxygen Saturation is 99% on RA, normal by my interpretation.    COORDINATION OF CARE: 2:16 AM Discussed treatment plan with pt at bedside and pt agreed to plan.    Labs (all labs ordered are listed, but only abnormal results are displayed) Labs Reviewed  CBC WITH DIFFERENTIAL/PLATELET - Abnormal; Notable for the following:       Result Value   Hemoglobin 11.6 (*)    HCT 35.5 (*)    Platelets 401 (*)    All other components within normal limits  BASIC METABOLIC PANEL    EKG  EKG Interpretation None       Radiology Dg Finger Middle Right  Result Date: 10/03/2016 CLINICAL DATA:  Acute onset of right third finger infection, with swelling and drainage. Initial encounter. EXAM: RIGHT MIDDLE FINGER 2+V COMPARISON:  None. FINDINGS: There is diffuse erosion involving the third distal phalanx, with a moth-eaten appearance, compatible with osteomyelitis. The third middle phalanx appears grossly intact. Surrounding soft tissue swelling is noted. Visualized joint spaces are preserved. IMPRESSION: Diffuse erosion involving the third distal  phalanx, with a moth-eaten appearance, compatible with osteomyelitis. Electronically Signed   By: Garald Balding M.D.   On: 10/03/2016 22:28    Procedures Procedures (including critical care time)  Medications Ordered in ED Medications - No data to display   Initial Impression / Assessment and Plan / ED Course  I have reviewed the triage vital signs and the nursing notes.  Pertinent labs & imaging results that were available during my care of the patient were reviewed by me and considered in my medical decision making (see chart for details).  Clinical Course    X-rays reveal osteomyelitis of the middle finger. She has an open sore that is quite swollen. She will be treated with IV antibiotics and admission for repeat IV antibiotic administration. She will also likely require orthopedic consultation.  Final Clinical Impressions(s) / ED Diagnoses   Final diagnoses:  None    New Prescriptions New Prescriptions   No medications on file     I personally performed the services described in this documentation, which was scribed in my presence. The recorded information has been reviewed and is accurate.        Veryl Speak, MD 10/04/16 [phone]

## 2016-10-04 NOTE — Consult Note (Signed)
Reason for Consult:RIGHT LONG FINGER INFECTION Referring Physician: DR. Elroy Channel is an 65 y.o. female.  HPI: Diana Coleman avery pleasant 65 y.o.right-handed female with medical history significant for hypertension, insulin-dependent diabetes mellitus, history of Charcot foot with osteomyelitis status post bilateral lower extremity amputations who presents the emergency department as directed by urgent care provider for evaluation of right middle finger swelling and discoloration. Patient reports that she noted some mild swelling and redness at the nail bed of her third finger on the right hand after having a manicure approximately 4-5 months ago. Over the last 4-5 months, patient has had intermittent swelling and redness around that distal finger, but had ultimately seemed to resolve each time until this past week. Now, over the past 3 days, patient notes rapid worsening which has involved loss of the fingernail, swelling and redness involving the entire digit, and thick purulent discharge from the fingertip. She reports neuropathy involving both hands and denies pain.   Past Medical History:  Diagnosis Date  . Cellulitis of foot, left 10/24/2014   hx/notes 10/24/2014  . Charcot foot due to diabetes mellitus ([place])   . Osteomyelitis of right foot ([place])    hx/notes 10/24/2014  . Type II diabetes mellitus ([employer])     Past Surgical History:  Procedure Laterality Date  . ABOVE KNEE LEG AMPUTATION Right 2004  . AMPUTATION Left 10/29/2014   Procedure: AMPUTATION BELOW KNEE - LEFT;  Surgeon: Newt Minion, MD;  Location: [facility];  Service: Orthopedics;  Laterality: Left;  . FOOT SURGERY Left 1980's   "ulcer removed"  . REDUCTION MAMMAPLASTY Bilateral 1987  . TOE AMPUTATION Left ~ 2011   "top of my 3rd toe"  . TUBAL LIGATION  1973    Family History  Problem Relation Age of Onset  . Uterine cancer Mother   . Gout Father   . Thyroid disease Sister     Social History:  reports that  she has quit smoking. Her smoking use included Cigarettes. She has a 15.00 pack-year smoking history. She has never used smokeless tobacco. She reports that she does not drink alcohol or use drugs.  Allergies:  Allergies  Allergen Reactions  . Clindamycin/Lincomycin Itching  . Codeine Itching    Medications: I have reviewed the patient's current medications.  Results for orders placed or performed during the hospital encounter of 10/04/16 (from the past 48 hour(s))  CBC with Differential     Status: Abnormal   Collection Time: 10/03/16 10:00 PM  Result Value Ref Range   WBC 9.1 4.0 - 10.5 K/uL   RBC 4.16 3.87 - 5.11 MIL/uL   Hemoglobin 11.6 (L) 12.0 - 15.0 g/dL   HCT 35.5 (L) 36.0 - 46.0 %   MCV 85.3 78.0 - 100.0 fL   MCH 27.9 26.0 - 34.0 pg   MCHC 32.7 30.0 - 36.0 g/dL   RDW 13.1 11.5 - 15.5 %   Platelets 401 (H) 150 - 400 K/uL   Neutrophils Relative % 60 %   Neutro Abs 5.4 1.7 - 7.7 K/uL   Lymphocytes Relative 29 %   Lymphs Abs 2.7 0.7 - 4.0 K/uL   Monocytes Relative 9 %   Monocytes Absolute 0.8 0.1 - 1.0 K/uL   Eosinophils Relative 2 %   Eosinophils Absolute 0.2 0.0 - 0.7 K/uL   Basophils Relative 0 %   Basophils Absolute 0.0 0.0 - 0.1 K/uL  Basic metabolic panel     Status: None   Collection  Time: 10/03/16 10:00 PM  Result Value Ref Range   Sodium 144 135 - 145 mmol/L   Potassium 4.3 3.5 - 5.1 mmol/L   Chloride 109 101 - 111 mmol/L   CO2 28 22 - 32 mmol/L   Glucose, Bld 96 65 - 99 mg/dL   BUN 9 6 - 20 mg/dL   Creatinine, Ser 0.76 0.44 - 1.00 mg/dL   Calcium 9.4 8.9 - 10.3 mg/dL   GFR calc non Af Amer >60 >60 mL/min   GFR calc Af Amer >60 >60 mL/min    Comment: (NOTE) The eGFR has been calculated using the CKD EPI equation. This calculation has not been validated in all clinical situations. eGFR's persistently <60 mL/min signify possible Chronic Kidney Disease.    Anion gap 7 5 - 15  Glucose, capillary     Status: None   Collection Time: 10/04/16  2:53 AM   Result Value Ref Range   Glucose-Capillary 97 65 - 99 mg/dL  Sedimentation rate     Status: Abnormal   Collection Time: 10/04/16  5:02 AM  Result Value Ref Range   Sed Rate 62 (H) 0 - 22 mm/hr  C-reactive protein     Status: Abnormal   Collection Time: 10/04/16  5:02 AM  Result Value Ref Range   CRP 2.0 (H) <1.0 mg/dL  Ferritin     Status: None   Collection Time: 10/04/16  5:02 AM  Result Value Ref Range   Ferritin 182 11 - 307 ng/mL  Iron and TIBC     Status: Abnormal   Collection Time: 10/04/16  5:02 AM  Result Value Ref Range   Iron 21 (L) 28 - 170 ug/dL   TIBC 188 (L) 250 - 450 ug/dL   Saturation Ratios 11 10.4 - 31.8 %   UIBC 167 ug/dL  Vitamin B12     Status: None   Collection Time: 10/04/16  5:02 AM  Result Value Ref Range   Vitamin B-12 205 180 - 914 pg/mL    Comment: (NOTE) This assay is not validated for testing neonatal or myeloproliferative syndrome specimens for Vitamin B12 levels.   Glucose, capillary     Status: Abnormal   Collection Time: 10/04/16  7:49 AM  Result Value Ref Range   Glucose-Capillary 167 (H) 65 - 99 mg/dL    Dg Finger Middle Right  Result Date: 10/03/2016 CLINICAL DATA:  Acute onset of right third finger infection, with swelling and drainage. Initial encounter. EXAM: RIGHT MIDDLE FINGER 2+V COMPARISON:  None. FINDINGS: There is diffuse erosion involving the third distal phalanx, with a moth-eaten appearance, compatible with osteomyelitis. The third middle phalanx appears grossly intact. Surrounding soft tissue swelling is noted. Visualized joint spaces are preserved. IMPRESSION: Diffuse erosion involving the third distal phalanx, with a moth-eaten appearance, compatible with osteomyelitis. Electronically Signed   By: Garald Balding M.D.   On: 10/03/2016 22:28    ROS AS NOTED IN MEDICAL NOTES Blood pressure (!) 157/96, pulse 80, temperature 98.7 F (37.1 C), temperature source Oral, resp. rate 16, height '5\' 7"'  (1.702 m), weight 184 lb 1.4 oz  (83.5 kg), SpO2 99 %. Physical Exam  General Appearance:  Alert, cooperative, no distress, appears stated age  Head:  Normocephalic, without obvious abnormality, atraumatic  Eyes:  Pupils equal, conjunctiva/corneas clear,         Throat: Lips, mucosa, and tongue normal; teeth and gums normal  Neck: No visible masses     Lungs:   respirations unlabored  Chest  Wall:  No tenderness or deformity  Heart:  Regular rate and rhythm,  Abdomen:   Soft, non-tender,         Extremities: RIGHT HAND: PALPABLE RADIAL PULSE, LARGE FUSIFORM SWELLING TO LONG FINGER WITH OPEN WOUND AND EVIDENCE OF INFECTION NO WOUNDS TO INDEX/RING/SMALL AND THUMB ABLE TO FLEX AND EXTEND WRIST  Pulses: 2+ and symmetric  Skin: Skin color, texture, turgor normal, no rashes or lesions     Neurologic: Normal    Assessment/Plan: RIGHT LONG FINGER OSTEOMYELITIS AND FLEXOR SHEATH INFECTION  RIGHT LONG FINGER DEBRIDEMENT AND AMPUTATION AND FLEXOR SHEATH DRAINAGE  R/B/A DISCUSSED WITH PT IN HOSPITAL.  PT VOICED UNDERSTANDING OF PLAN CONSENT SIGNED DAY OF SURGERY PT SEEN AND EXAMINED PRIOR TO OPERATIVE PROCEDURE/DAY OF SURGERY SITE MARKED. QUESTIONS ANSWERED WILL REMAIN AN INPATIENT FOLLOWING SURGERY  WE ARE PLANNING SURGERY FOR YOUR UPPER EXTREMITY. THE RISKS AND BENEFITS OF SURGERY INCLUDE BUT NOT LIMITED TO BLEEDING INFECTION, DAMAGE TO NEARBY NERVES ARTERIES TENDONS, FAILURE OF SURGERY TO ACCOMPLISH ITS INTENDED GOALS, PERSISTENT SYMPTOMS AND NEED FOR FURTHER SURGICAL INTERVENTION. WITH THIS IN MIND WE WILL PROCEED. I HAVE DISCUSSED WITH THE PATIENT THE PRE AND POSTOPERATIVE REGIMEN AND THE DOS AND DON'TS. PT VOICED UNDERSTANDING AND INFORMED CONSENT SIGNED.  Linna Hoff 10/04/2016, 11:38 AM

## 2016-10-04 NOTE — H&P (Signed)
History and Physical    Diana Coleman [id number] DOB: Feb 25, 1951 DOA: 10/04/2016  PCP: Elyn Peers, MD   Patient coming from: Home, by way of Urgent Care   Chief Complaint: Right middle finger swelling and discoloration, malaise   HPI: Diana Coleman is a very pleasant 65 y.o. right-handed female with medical history significant for hypertension, insulin-dependent diabetes mellitus, history of Charcot foot with osteomyelitis status post bilateral lower extremity amputations who presents the emergency department as directed by urgent care provider for evaluation of right middle finger swelling and discoloration. Patient reports that she noted some mild swelling and redness at the nail bed of her third finger on the right hand after having a manicure approximately 4-5 months ago. These changes eventually resolved spontaneously before reappearing. Over the last 4-5 months, patient has had intermittent swelling and redness around that distal finger, but had ultimately seemed to resolve each time until this past week. Now, over the past 3 days, patient notes rapid worsening which has involved loss of the fingernail, swelling and redness involving the entire digit, and thick purulent discharge from the fingertip. She reports neuropathy involving both hands and denies pain. Patient endorses associated malaise with loss of appetite, but denies fevers or chills per se. She also denies chest pain, palpitations, dyspnea, or cough. She was evaluated in an urgent care directed to the emergency department for further evaluation and management.   ED Course: Upon arrival to the ED, patient is found to be afebrile, saturating well on room air, and with vital signs stable. Chemistry panel is unremarkable and CBC is notable for a normocytic anemia with hemoglobin of 11.6, and a mild thrombocytosis with platelets of 401,000. Radiographs of the right third digit were obtained and revealed diffuse erosion in the third  distal phalanx with a moth-eaten appearance consistent with osteomyelitis. Patient was started on empiric vancomycin and Zosyn in the emergency department, remained hemodynamically stable and in no apparent respiratory distress, and will be observed in the medical-surgical unit for ongoing evaluation and management of right third finger infection with osteomyelitis.   Review of Systems:  All other systems reviewed and apart from HPI, are negative.  Past Medical History:  Diagnosis Date  . Cellulitis of foot, left 10/24/2014   hx/notes 10/24/2014  . Charcot foot due to diabetes mellitus ([place])   . Osteomyelitis of right foot ([place])    hx/notes 10/24/2014  . Type II diabetes mellitus ([place])     Past Surgical History:  Procedure Laterality Date  . ABOVE KNEE LEG AMPUTATION Right 2004  . AMPUTATION Left 10/29/2014   Procedure: AMPUTATION BELOW KNEE - LEFT;  Surgeon: Newt Minion, MD;  Location: [facility];  Service: Orthopedics;  Laterality: Left;  . FOOT SURGERY Left 1980's   "ulcer removed"  . REDUCTION MAMMAPLASTY Bilateral 1987  . TOE AMPUTATION Left ~ 2011   "top of my 3rd toe"  . TUBAL LIGATION  1973     reports that she has quit smoking. Her smoking use included Cigarettes. She has a 15.00 pack-year smoking history. She has never used smokeless tobacco. She reports that she does not drink alcohol or use drugs.  Allergies  Allergen Reactions  . Clindamycin/Lincomycin Itching  . Codeine Itching    Family History  Problem Relation Age of Onset  . Uterine cancer Mother   . Gout Father   . Thyroid disease Sister      Prior to Admission medications   Medication Sig Start Date End Date  Taking? Authorizing Provider  amLODipine (NORVASC) 10 MG tablet Take 10 mg by mouth daily.    Historical Provider, MD  amLODipine (NORVASC) 5 MG tablet Take 1 tablet (5 mg total) by mouth daily. 10/31/14   Shanker Kristeen Mans, MD  cyclobenzaprine (FLEXERIL) 10 MG tablet Take 1 tablet (10 mg total) by  mouth 2 (two) times daily as needed for muscle spasms. 07/31/15   Blanchie Dessert, MD  Dulaglutide (TRULICITY) 1.5 0000000 SOPN Inject 1.5 mg into the skin once a week.    Historical Provider, MD  famotidine (PEPCID) 20 MG tablet Take 20 mg by mouth daily.  09/29/14   Historical Provider, MD  HYDROcodone-acetaminophen (NORCO/VICODIN) 5-325 MG per tablet Take 1-2 tablets by mouth every 6 (six) hours as needed. 07/31/15   Blanchie Dessert, MD  insulin aspart (NOVOLOG) 100 UNIT/ML injection 0-15 Units, Subcutaneous, 3 times daily with meals CBG < 70: implement hypoglycemia protocol CBG 70 - 120: 0 units CBG 121 - 150: 2 units CBG 151 - 200: 3 units CBG 201 - 250: 5 units CBG 251 - 300: 8 units CBG 301 - 350: 11 units CBG 351 - 400: 15 units CBG > 400: call MD Patient not taking: Reported on 07/31/2015 10/31/14   Shanker Kristeen Mans, MD  Insulin Degludec (TRESIBA FLEXTOUCH) 100 UNIT/ML SOPN Inject 17 Units into the skin daily.    Historical Provider, MD  Insulin Glargine (TOUJEO SOLOSTAR) 300 UNIT/ML SOPN Inject 22 Units into the skin at bedtime. Patient not taking: Reported on 07/31/2015 10/31/14   Jonetta Osgood, MD  Insulin Human (INSULIN PUMP) SOLN Inject 1 each into the skin continuous.    Historical Provider, MD  lisinopril (PRINIVIL,ZESTRIL) 10 MG tablet Take 1 tablet (10 mg total) by mouth daily. 11/02/14   Kelvin Cellar, MD  lisinopril (PRINIVIL,ZESTRIL) 5 MG tablet Take 5 mg by mouth daily.    Historical Provider, MD  metFORMIN (GLUCOPHAGE) 1000 MG tablet Take 1,000 mg by mouth 2 (two) times daily with a meal.    Historical Provider, MD  metoCLOPramide (REGLAN) 10 MG tablet Take 1 tablet (10 mg total) by mouth 3 (three) times daily before meals. Patient not taking: Reported on 02/15/2015 11/02/14   Kelvin Cellar, MD  ondansetron (ZOFRAN) 4 MG tablet Take 1 tablet (4 mg total) by mouth every 6 (six) hours as needed for nausea. Patient not taking: Reported on 07/31/2015 10/31/14   Jonetta Osgood, MD  oxyCODONE-acetaminophen (PERCOCET/ROXICET) 5-325 MG per tablet Take 1-2 tablets by mouth every 6 (six) hours as needed for moderate pain or severe pain. Patient not taking: Reported on 07/31/2015 10/31/14   Jonetta Osgood, MD  polyethylene glycol (MIRALAX / Floria Raveling) packet Take 17 g by mouth daily. Patient not taking: Reported on 07/31/2015 10/31/14   Jonetta Osgood, MD  senna-docusate (SENOKOT-S) 8.6-50 MG per tablet Take 2 tablets by mouth 2 (two) times daily. Patient not taking: Reported on 07/31/2015 10/31/14   Jonetta Osgood, MD    Physical Exam: Vitals:   10/04/16 0130 10/04/16 0145 10/04/16 0200 10/04/16 0214  BP: 147/77  154/81 154/81  Pulse: 78 78 77 80  Resp:    18  Temp:      TempSrc:      SpO2: 97% 98% 97% 97%      Constitutional: NAD, calm, comfortable Eyes: PERTLA, lids and conjunctivae normal ENMT: Mucous membranes are moist. Posterior pharynx clear of any exudate or lesions.   Neck: normal, supple, no masses, no thyromegaly Respiratory:  clear to auscultation bilaterally, no wheezing, no crackles. Normal respiratory effort.   Cardiovascular: S1 & S2 heard, regular rate and rhythm. No carotid bruits. No significant JVD. Abdomen: No distension, no tenderness, no masses palpated. Bowel sounds normal.  Musculoskeletal: no clubbing / cyanosis. S/p bilateral BKA's; finger findings below. Normal muscle tone.  Skin: Right 3rd finger swollen throughout its entirety, constricted at the base by a ring that's trapped in place by the swelling; distal tip with exfoliation, loss of nail, fluctuance and thick purulent discharge; erythema from DIP to MCP. Skin is otherwise warm, dry, well-perfused. Neurologic: CN 2-12 grossly intact. Sensation intact, DTR normal. Strength 5/5 in all 4 limbs.  Psychiatric: Normal judgment and insight. Alert and oriented x 3. Normal mood and affect.     Labs on Admission: I have personally reviewed following labs and imaging  studies  CBC:  Recent Labs Lab 10/03/16 2200  WBC 9.1  NEUTROABS 5.4  HGB 11.6*  HCT 35.5*  MCV 85.3  PLT 123XX123*   Basic Metabolic Panel:  Recent Labs Lab 10/03/16 2200  NA 144  K 4.3  CL 109  CO2 28  GLUCOSE 96  BUN 9  CREATININE 0.76  CALCIUM 9.4   GFR: CrCl cannot be calculated (Unknown ideal weight.). Liver Function Tests: No results for input(s): AST, ALT, ALKPHOS, BILITOT, PROT, ALBUMIN in the last 168 hours. No results for input(s): LIPASE, AMYLASE in the last 168 hours. No results for input(s): AMMONIA in the last 168 hours. Coagulation Profile: No results for input(s): INR, PROTIME in the last 168 hours. Cardiac Enzymes: No results for input(s): CKTOTAL, CKMB, CKMBINDEX, TROPONINI in the last 168 hours. BNP (last 3 results) No results for input(s): PROBNP in the last 8760 hours. HbA1C: No results for input(s): HGBA1C in the last 72 hours. CBG: No results for input(s): GLUCAP in the last 168 hours. Lipid Profile: No results for input(s): CHOL, HDL, LDLCALC, TRIG, CHOLHDL, LDLDIRECT in the last 72 hours. Thyroid Function Tests: No results for input(s): TSH, T4TOTAL, FREET4, T3FREE, THYROIDAB in the last 72 hours. Anemia Panel: No results for input(s): VITAMINB12, FOLATE, FERRITIN, TIBC, IRON, RETICCTPCT in the last 72 hours. Urine analysis:    Component Value Date/Time   COLORURINE YELLOW 07/31/2015 [place] 07/31/2015 0854   LABSPEC 1.022 07/31/2015 0854   PHURINE 5.0 07/31/2015 0854   GLUCOSEU NEGATIVE 07/31/2015 0854   HGBUR SMALL (A) 07/31/2015 0854   BILIRUBINUR NEGATIVE 07/31/2015 0854   KETONESUR NEGATIVE 07/31/2015 0854   PROTEINUR NEGATIVE 07/31/2015 0854   UROBILINOGEN 1.0 07/31/2015 0854   NITRITE NEGATIVE 07/31/2015 0854   LEUKOCYTESUR SMALL (A) 07/31/2015 0854   Sepsis Labs: @LABRCNTIP (procalcitonin:4,lacticidven:4) )No results found for this or any previous visit (from the past 240 hour(s)).   Radiological Exams  on Admission: Dg Finger Middle Right  Result Date: 10/03/2016 CLINICAL DATA:  Acute onset of right third finger infection, with swelling and drainage. Initial encounter. EXAM: RIGHT MIDDLE FINGER 2+V COMPARISON:  None. FINDINGS: There is diffuse erosion involving the third distal phalanx, with a moth-eaten appearance, compatible with osteomyelitis. The third middle phalanx appears grossly intact. Surrounding soft tissue swelling is noted. Visualized joint spaces are preserved. IMPRESSION: Diffuse erosion involving the third distal phalanx, with a moth-eaten appearance, compatible with osteomyelitis. Electronically Signed   By: Garald Balding M.D.   On: 10/03/2016 22:28    EKG: Not performed, will obtain as appropriate.   Assessment/Plan  1. Finger infection with osteomyelitis - Sxs started 4-5 mos  ago with mild erythema and edema, waxed and waned, and has now worsened progressively over 3 days leading up to admission  - Pt reports malaise, loss of appetite, but there is no fever or leukocytosis; thrombocytosis noted  - Radiographic appearance highly suggestive of osteomyelitis  - She was started on empiric vancomycin and Zosyn in ED and this will be continued for now  - Check inflammatory markers and trend  - Obtain surgical consultation if deemed appropriate in the am  - Ring is stuck in place and will be cut in ED    2. Insulin-dependent DM  - A1c 8.0% in 2015  - Patient now using insulin pump, will continue with CBG monitoring  3. Hypertension   - At goal currently  - Continue Norvasc and lisinopril as tolerated    4. GERD - Stable; no EGD report on file  - Continue daily Pepcid   5. Normocytic anemia, thrombocytosis  - Hgb 11.6, down from 13.3 in March 2017; drop possibly d/t the smoldering infection; no s/s of active bleeding  - Check iron studies, B12, and folate; supplement prn  - Platelets mildly elevated to 401,000 - likely in response to the infection     DVT  prophylaxis: sq heparin Code Status: Full  Family Communication: Discussed with patient Disposition Plan: Observe on med-surg Consults called: None Admission status: Observation    Vianne Bulls, MD [facility] Pager [phone]  If 7PM-7AM, please contact night-coverage [email or web address] Password [email or web address]  10/04/2016, 2:40 AM

## 2016-10-04 NOTE — Progress Notes (Signed)
Pharmacy Antibiotic Note  MONAE MCTEAGUE is a 65 y.o. female admitted on 10/04/2016 with R finger osteomyelitis .  Pharmacy has been consulted for Vancomycin and Zosyn  Dosing.  Vancomycin 1 g IV given in ED at  0200  Plan: Vancomycin 1250 mg IV q12h Zosyn 3.375 g IV q8h     Temp (24hrs), Avg:98.4 F (36.9 C), Min:97.9 F (36.6 C), Max:98.8 F (37.1 C)   Recent Labs Lab 10/03/16 2200  WBC 9.1  CREATININE 0.76    CrCl cannot be calculated (Unknown ideal weight.).    Allergies  Allergen Reactions  . Clindamycin/Lincomycin Itching  . Codeine Itching    Caryl Pina 10/04/2016 2:39 AM

## 2016-10-04 NOTE — Progress Notes (Addendum)
PROGRESS NOTE    Diana Coleman  [id number] DOB: [birth year] DOA: 10/04/2016 PCP: Elyn Peers, MD     Brief Narrative:  Diana Coleman is a very pleasant 65 y.o. right-handed female with medical history significant for hypertension, insulin-dependent diabetes mellitus, history of Charcot foot with osteomyelitis status post bilateral lower extremity amputations who presents the emergency department as directed by urgent care provider for evaluation of right middle finger swelling and discoloration. Patient reports that she noted some mild swelling and redness at the nail bed of her third finger on the right hand after having a manicure approximately 4-5 months ago. Over the last 4-5 months, patient has had intermittent swelling and redness around that distal finger, but had ultimately seemed to resolve each time until this past week. Now, over the past 3 days, patient notes rapid worsening which has involved loss of the fingernail, swelling and redness involving the entire digit, and thick purulent discharge from the fingertip. She reports neuropathy involving both hands and denies pain.   Assessment & Plan:   Principal Problem:   Osteomyelitis ([place]) Active Problems:   Insulin dependent diabetes mellitus ([facility])   Essential hypertension   GERD (gastroesophageal reflux disease)   Normocytic anemia   Finger infection   Osteomyelitis of finger ([facility])   Right third digit osteomyelitis - X-ray in ED, highly suggestive of osteomyelitis - Empiric Vanco and Zosyn started - Called hand surgery for consultation - NPO for now until evaluated by surgery. If no intervention planned, resume diabetic diet   Insulin-dependent Dm, with complication including neuropathy and bilateral LE amputation  - A1c 8.0% in 2015  - Check Ha1c  - Takes tresiba at home 17 units. For now cover SSI q4h while NPO   Hypertension   - Continue Norvasc and lisinopril    GERD - Continue Pepcid   Chronic  normocytic anemia - Iron studies consistent with anemia chronic disease  - Monitor    DVT prophylaxis: subq hep  Code Status: full Family Communication: no family at bedside Disposition Plan: pending further treatment, discharge back home   Consultants:   Hand surgery  Procedures:   None  Antimicrobials:   Vanco 10/14 >>  Zosyn 10/14 >>     Subjective: Patient doing well this morning. She denies any fevers, chills prior to admission. She admits to right middle fingernail loss as well as swelling, drainage, but no pain. She has neuropathy of bilateral hands due to diabetes. She states that besides lack of appetite, she has had essentially no other symptoms. She denies any chest pain, shortness of breath, nausea, vomiting, diarrhea, abdominal pain.  Objective: Vitals:   10/04/16 0200 10/04/16 0214 10/04/16 0301 10/04/16 0525  BP: 154/81 154/81 (!) 165/87 112/61  Pulse: 77 80 81 79  Resp:  18 19 19   Temp:   98.1 F (36.7 C) 98 F (36.7 C)  TempSrc:   Oral Oral  SpO2: 97% 97% 100% 100%  Weight:   83.5 kg (184 lb 1.4 oz)   Height:   5\' 7"  (1.702 m)     Intake/Output Summary (Last 24 hours) at 10/04/16 0808 Last data filed at 10/04/16 0543  Gross per 24 hour  Intake              240 ml  Output              125 ml  Net              115 ml  Filed Weights   10/04/16 0301  Weight: 83.5 kg (184 lb 1.4 oz)    Examination:  General exam: Appears calm and comfortable  Respiratory system: Clear to auscultation. Respiratory effort normal. Cardiovascular system: S1 & S2 heard, RRR. No JVD, murmurs, rubs, gallops or clicks. No pedal edema. Gastrointestinal system: Abdomen is nondistended, soft and nontender. No organomegaly or masses felt. Normal bowel sounds heard. Central nervous system: Alert and oriented. No focal neurological deficits. Extremities: s/p bilateral LE amputation below knee. +right middle finger with clear yellow drainage, swelling  Psychiatry:  Judgement and insight appear normal. Mood & affect appropriate.   Data Reviewed: I have personally reviewed following labs and imaging studies  CBC:  Recent Labs Lab 10/03/16 2200  WBC 9.1  NEUTROABS 5.4  HGB 11.6*  HCT 35.5*  MCV 85.3  PLT 123XX123*   Basic Metabolic Panel:  Recent Labs Lab 10/03/16 2200  NA 144  K 4.3  CL 109  CO2 28  GLUCOSE 96  BUN 9  CREATININE 0.76  CALCIUM 9.4   GFR: Estimated Creatinine Clearance: 75.8 mL/min (by C-G formula based on SCr of 0.76 mg/dL). Liver Function Tests: No results for input(s): AST, ALT, ALKPHOS, BILITOT, PROT, ALBUMIN in the last 168 hours. No results for input(s): LIPASE, AMYLASE in the last 168 hours. No results for input(s): AMMONIA in the last 168 hours. Coagulation Profile: No results for input(s): INR, PROTIME in the last 168 hours. Cardiac Enzymes: No results for input(s): CKTOTAL, CKMB, CKMBINDEX, TROPONINI in the last 168 hours. BNP (last 3 results) No results for input(s): PROBNP in the last 8760 hours. HbA1C: No results for input(s): HGBA1C in the last 72 hours. CBG:  Recent Labs Lab 10/04/16 0253 10/04/16 0749  GLUCAP 97 167*   Lipid Profile: No results for input(s): CHOL, HDL, LDLCALC, TRIG, CHOLHDL, LDLDIRECT in the last 72 hours. Thyroid Function Tests: No results for input(s): TSH, T4TOTAL, FREET4, T3FREE, THYROIDAB in the last 72 hours. Anemia Panel:  Recent Labs  10/04/16 0502  VITAMINB12 205  FERRITIN 182  TIBC 188*  IRON 21*   Sepsis Labs: No results for input(s): PROCALCITON, LATICACIDVEN in the last 168 hours.  No results found for this or any previous visit (from the past 240 hour(s)).     Radiology Studies: Dg Finger Middle Right  Result Date: 10/03/2016 CLINICAL DATA:  Acute onset of right third finger infection, with swelling and drainage. Initial encounter. EXAM: RIGHT MIDDLE FINGER 2+V COMPARISON:  None. FINDINGS: There is diffuse erosion involving the third distal  phalanx, with a moth-eaten appearance, compatible with osteomyelitis. The third middle phalanx appears grossly intact. Surrounding soft tissue swelling is noted. Visualized joint spaces are preserved. IMPRESSION: Diffuse erosion involving the third distal phalanx, with a moth-eaten appearance, compatible with osteomyelitis. Electronically Signed   By: Garald Balding M.D.   On: 10/03/2016 22:28      Scheduled Meds: . amLODipine  10 mg Oral Daily  . famotidine  20 mg Oral Daily  . heparin  5,000 Units Subcutaneous Q8H  . lisinopril  5 mg Oral Daily  . piperacillin-tazobactam (ZOSYN)  IV  3.375 g Intravenous Q8H  . sodium chloride flush  3 mL Intravenous Q12H  . vancomycin  1,250 mg Intravenous Q12H   Continuous Infusions: . insulin pump       LOS: 0 days    Time spent: 40 minutes   Dessa Phi, DO [facility] [email or web address] Password [email or web address] 10/04/2016, 9:41 AM

## 2016-10-04 NOTE — Anesthesia Procedure Notes (Signed)
Procedure Name: Intubation Date/Time: 10/04/2016 12:30 PM Performed by: Marinda Elk A Pre-anesthesia Checklist: Patient identified, Emergency Drugs available, Suction available, Patient being monitored and Timeout performed Patient Re-evaluated:Patient Re-evaluated prior to inductionOxygen Delivery Method: Circle System Utilized and Circle system utilized Preoxygenation: Pre-oxygenation with 100% oxygen Intubation Type: IV induction, Cricoid Pressure applied and Rapid sequence Laryngoscope Size: Mac and 3 Grade View: Grade I Tube type: Oral Tube size: 7.5 mm Number of attempts: 1 Airway Equipment and Method: Stylet Placement Confirmation: ETT inserted through vocal cords under direct vision,  positive ETCO2 and breath sounds checked- equal and bilateral Secured at: 20 cm Tube secured with: Tape Dental Injury: Teeth and Oropharynx as per pre-operative assessment

## 2016-10-04 NOTE — Plan of Care (Signed)
Problem: Nutrition: Goal: Adequate nutrition will be maintained Outcome: Progressing Currently NPO pending surgical consult. Carb modified diet to be resumed per MD.

## 2016-10-04 NOTE — ED Notes (Signed)
Ring at right middle finger cut and placed in a plastic bottle and secured in pt.'s bag.

## 2016-10-04 NOTE — Progress Notes (Signed)
Report called to [facility] in Short Stay. Consent filled out but not signed pending MD informed consent. Surgical PCR sent. CHG bath complete. VS and cbg done. NPO since midnight. Bartholomew Crews, RN

## 2016-10-04 NOTE — Transfer of Care (Signed)
Immediate Anesthesia Transfer of Care Note  Patient: Diana Coleman  Procedure(s) Performed: Procedure(s): IRRIGATION AND DEBRIDEMENT MIDDLE FINGER AND AMPUTATION (Right)  Patient Location: PACU  Anesthesia Type:General  Level of Consciousness: awake  Airway & Oxygen Therapy: Patient Spontanous Breathing and Patient connected to nasal cannula oxygen  Post-op Assessment: Report given to RN and Post -op Vital signs reviewed and stable  Post vital signs: Reviewed and stable  Last Vitals:  Vitals:   10/04/16 1002 10/04/16 1131  BP: 127/75 (!) 157/96  Pulse: 82 80  Resp: 16 16  Temp: 36.8 C 37.1 C    Last Pain:  Vitals:   10/04/16 1131  TempSrc: Oral  PainSc:          Complications: No apparent anesthesia complications

## 2016-10-04 NOTE — Anesthesia Preprocedure Evaluation (Signed)
Anesthesia Evaluation  Patient identified by MRN, date of birth, ID band Patient awake    Reviewed: Allergy & Precautions, NPO status , Patient's Chart, lab work & pertinent test results  Airway Mallampati: I  TM Distance: >3 FB Neck ROM: Full    Dental  (+) Dental Advisory Given, Edentulous Upper, Partial Lower,    Pulmonary former smoker,    Pulmonary exam normal breath sounds clear to auscultation       Cardiovascular hypertension, Pt. on medications (-) angina(-) Past MI and (-) CHF Normal cardiovascular exam Rhythm:Regular Rate:Normal     Neuro/Psych negative neurological ROS  negative psych ROS   GI/Hepatic Neg liver ROS, GERD  Medicated and Controlled,  Endo/Other  diabetes, Type 2, Insulin Dependent, Oral Hypoglycemic Agents  Renal/GU negative Renal ROS     Musculoskeletal  (+) Arthritis , Osteoarthritis,    Abdominal   Peds  Hematology  (+) Blood dyscrasia, anemia ,   Anesthesia Other Findings Day of surgery medications reviewed with the patient.  Reproductive/Obstetrics                             Anesthesia Physical Anesthesia Plan  ASA: II and emergent  Anesthesia Plan: General   Post-op Pain Management:    Induction: Intravenous, Rapid sequence and Cricoid pressure planned  Airway Management Planned: Oral ETT  Additional Equipment:   Intra-op Plan:   Post-operative Plan: Extubation in OR  Informed Consent: I have reviewed the patients History and Physical, chart, labs and discussed the procedure including the risks, benefits and alternatives for the proposed anesthesia with the patient or authorized representative who has indicated his/her understanding and acceptance.   Dental advisory given  Plan Discussed with: CRNA  Anesthesia Plan Comments: (Risks/benefits of general anesthesia discussed with patient including risk of damage to teeth, lips, gum, and tongue,  nausea/vomiting, allergic reactions to medications, and the possibility of heart attack, stroke and death.  All patient questions answered.  Patient wishes to proceed.  Last ate at 0400.  8 hours NPO will be 1200.)        Anesthesia Quick Evaluation

## 2016-10-04 NOTE — ED Notes (Signed)
Pt transported to floor in stable condition

## 2016-10-05 LAB — BASIC METABOLIC PANEL
Anion gap: 7 (ref 5–15)
BUN: 11 mg/dL (ref 6–20)
CALCIUM: 8.4 mg/dL — AB (ref 8.9–10.3)
CO2: 27 mmol/L (ref 22–32)
CREATININE: 0.82 mg/dL (ref 0.44–1.00)
Chloride: 107 mmol/L (ref 101–111)
Glucose, Bld: 167 mg/dL — ABNORMAL HIGH (ref 65–99)
Potassium: 4 mmol/L (ref 3.5–5.1)
SODIUM: 141 mmol/L (ref 135–145)

## 2016-10-05 LAB — CBC WITH DIFFERENTIAL/PLATELET
BASOS PCT: 1 %
Basophils Absolute: 0 10*3/uL (ref 0.0–0.1)
EOS ABS: 0.2 10*3/uL (ref 0.0–0.7)
EOS PCT: 3 %
HCT: 33.1 % — ABNORMAL LOW (ref 36.0–46.0)
Hemoglobin: 10.5 g/dL — ABNORMAL LOW (ref 12.0–15.0)
LYMPHS ABS: 1.5 10*3/uL (ref 0.7–4.0)
Lymphocytes Relative: 23 %
MCH: 27.1 pg (ref 26.0–34.0)
MCHC: 31.7 g/dL (ref 30.0–36.0)
MCV: 85.3 fL (ref 78.0–100.0)
MONOS PCT: 10 %
Monocytes Absolute: 0.6 10*3/uL (ref 0.1–1.0)
Neutro Abs: 4.2 10*3/uL (ref 1.7–7.7)
Neutrophils Relative %: 63 %
PLATELETS: 358 10*3/uL (ref 150–400)
RBC: 3.88 MIL/uL (ref 3.87–5.11)
RDW: 13.1 % (ref 11.5–15.5)
WBC: 6.5 10*3/uL (ref 4.0–10.5)

## 2016-10-05 LAB — GLUCOSE, CAPILLARY
GLUCOSE-CAPILLARY: 149 mg/dL — AB (ref 65–99)
GLUCOSE-CAPILLARY: 247 mg/dL — AB (ref 65–99)

## 2016-10-05 LAB — HEMOGLOBIN A1C
HEMOGLOBIN A1C: 8 % — AB (ref 4.8–5.6)
MEAN PLASMA GLUCOSE: 183 mg/dL

## 2016-10-05 MED ORDER — CIPROFLOXACIN HCL 750 MG PO TABS
750.0000 mg | ORAL_TABLET | Freq: Two times a day (BID) | ORAL | 0 refills | Status: AC
Start: 2016-10-05 — End: 2016-10-12

## 2016-10-05 MED ORDER — CEPHALEXIN 500 MG PO CAPS: 500.0000 mg | ORAL_CAPSULE | Freq: Four times a day (QID) | ORAL | 0 refills | Status: AC

## 2016-10-05 NOTE — Anesthesia Postprocedure Evaluation (Signed)
Anesthesia Post Note  Patient: Diana Coleman  Procedure(s) Performed: Procedure(s) (LRB): IRRIGATION AND DEBRIDEMENT MIDDLE FINGER AND AMPUTATION (Right)  Patient location during evaluation: PACU Anesthesia Type: General Level of consciousness: awake and alert Pain management: pain level controlled Vital Signs Assessment: post-procedure vital signs reviewed and stable Respiratory status: spontaneous breathing, nonlabored ventilation and respiratory function stable Cardiovascular status: blood pressure returned to baseline and stable Postop Assessment: no signs of nausea or vomiting Anesthetic complications: no    Last Vitals:  Vitals:   10/05/16 0535 10/05/16 0905  BP: (!) 143/75 114/61  Pulse: 77 79  Resp: 18 18  Temp: 36.8 C 36.9 C    Last Pain:  Vitals:   10/05/16 0905  TempSrc: Oral  PainSc:                  Aerika Groll A

## 2016-10-05 NOTE — Progress Notes (Signed)
Patient discharge teaching given, including activity, diet, follow-up appoints, and medications. Patient verbalized understanding of all discharge instructions. IV access was d/c'd. Vitals are stable. Skin is intact except as charted in most recent assessments. Pt to be escorted out by NT, to be driven home by family.  Brodie Scovell, MBA, BSN, RN 

## 2016-10-05 NOTE — Progress Notes (Signed)
@   18:43, called and left message for patient, that she left a couple of clothing items here.  Informed her that we would keep them at the desk.  Jillyn Ledger, MBA, BSN, RN

## 2016-10-05 NOTE — Discharge Summary (Signed)
Physician Discharge Summary  Diana Coleman [id number] DOB: [birth year] DOA: 10/04/2016  PCP: Diana Peers, MD  Admit date: 10/04/2016 Discharge date: 10/05/2016  Admitted From: Home Disposition:  Home  Recommendations for Outpatient Follow-up:  1. Follow up with PCP in 1-2 weeks. If no improvement, she will need referral to infectious disease for further antibiotic management as her initial injury was due to manicure/water-related and no culture was obtained during this hospitalization to rule out atypical bacterial infection.  2. Follow up with Orthopedic surgery/hand surgery (Dr. Iran Planas) in 10 days. Keep the bandage on and keep dry until follow up visit.   3. Follow up Ha1c result. You need improved blood sugar control to ensure healing of wound.   Home Health: No  Equipment/Devices: None   Discharge Condition: Stable CODE STATUS: Full  Diet recommendation: Diabetic diet   Brief/Interim Summary: Diana Salines Palmeris avery pleasant 65 y.o.right-handed female with medical history significant for hypertension, insulin-dependent diabetes mellitus, history of Charcot foot with osteomyelitis status post bilateral lower extremity amputations who presents the emergency department as directed by urgent care provider for evaluation of right middle finger swelling and discoloration. Patient reports that she noted some mild swelling and redness at the nail bed of her third finger on the right hand after having a manicure approximately 4-5 months ago. Over the last 4-5 months, patient has had intermittent swelling and redness around that distal finger, but had ultimately seemed to resolve each time until this past week. Now, over the past 3 days, patient notes rapid worsening which has involved loss of the fingernail, swelling and redness involving the entire digit, and thick purulent discharge from the fingertip. She reports neuropathy involving both hands and denies pain.   She was started  on vanco and zosyn and was evaluated by hand surgery (dr. Caralyn Guile). She underwent debridement and partial amputation of right third digit on 10/14. She was discharged home with oral antibiotics for 7 days and instructed to follow up with PCP and Hand Surgery.    Discharge Diagnoses:  Principal Problem:   Osteomyelitis ([place]) Active Problems:   Insulin dependent diabetes mellitus (HCC)   Essential hypertension   GERD (gastroesophageal reflux disease)   Normocytic anemia   Finger infection   Osteomyelitis of finger (HCC)   Right third digit osteomyelitis - S/p right third digit debridement and partial amputation 10/14  - Empiric Vanco and Zosyn started, switched to Keflex and Cipro for 7 days after phone consulting with ID on call - Needs to follow up with surgery in 10-14 days   Insulin-dependent Dm, with complication including neuropathy and bilateral LE amputation  - A1c 8.0% in 2015  - Ha1c pending. Needs to follow up.  - Takes tresiba at home 17 units. For now cover SSI q4h while NPO   Hypertension  - Continue Norvasc and lisinopril   GERD - Continue Pepcid   Chronic normocytic anemia - Iron studies consistent with anemia chronic disease  - Monitor    Discharge Instructions  Discharge Instructions    Call MD for:  redness, tenderness, or signs of infection (pain, swelling, redness, odor or green/yellow discharge around incision site)    Complete by:  As directed    Diet Carb Modified    Complete by:  As directed    Discharge instructions    Complete by:  As directed    Follow up with PCP in 1-2 weeks. If no improvement, she will need referral to infectious disease for further antibiotic  management as her initial injury was due to manicure/water-related and no culture was obtained during this hospitalization to rule out atypical bacterial infection.  Follow up with Orthopedic surgery/hand surgery (Dr. Iran Planas) in 10 days. Keep the bandage on and keep dry until  follow up visit.   Follow up Ha1c result. You need improved blood sugar control to ensure healing of wound.   Increase activity slowly    Complete by:  As directed        Medication List    TAKE these medications   amLODipine 5 MG tablet Commonly known as:  NORVASC Take 1 tablet (5 mg total) by mouth daily. What changed:  how much to take   cephALEXin 500 MG capsule Commonly known as:  KEFLEX Take 1 capsule (500 mg total) by mouth 4 (four) times daily.   ciprofloxacin 750 MG tablet Commonly known as:  CIPRO Take 1 tablet (750 mg total) by mouth 2 (two) times daily.   famotidine 20 MG tablet Commonly known as:  PEPCID Take 20 mg by mouth daily.   lisinopril 10 MG tablet Commonly known as:  PRINIVIL,ZESTRIL Take 1 tablet (10 mg total) by mouth daily.   metFORMIN 1000 MG tablet Commonly known as:  GLUCOPHAGE Take 1,000 mg by mouth 2 (two) times daily with a meal.   TRESIBA FLEXTOUCH 100 UNIT/ML Sopn FlexTouch Pen Generic drug:  insulin degludec Inject 17 Units into the skin daily.   TRULICITY 1.5 0000000 Sopn Generic drug:  Dulaglutide Inject 1.5 mg into the skin once a week.      Follow-up Information    Diana Peers, MD. Schedule an appointment as soon as possible for a visit in 1 week(s).   Specialty:  Family Medicine Contact information: [address] [place] [address] [phone]        Linna Hoff, MD. Schedule an appointment as soon as possible for a visit in 10 day(s).   Specialty:  Orthopedic Surgery Contact information: [address] [place] [address] [phone]          Allergies  Allergen Reactions  . Clindamycin/Lincomycin Itching  . Codeine Itching    Consultations:  Ortho/hand sx   Procedures/Studies: Dg Finger Middle Right  Result Date: 10/03/2016 CLINICAL DATA:  Acute onset of right third finger infection, with swelling and drainage. Initial encounter. EXAM: RIGHT MIDDLE FINGER 2+V  COMPARISON:  None. FINDINGS: There is diffuse erosion involving the third distal phalanx, with a moth-eaten appearance, compatible with osteomyelitis. The third middle phalanx appears grossly intact. Surrounding soft tissue swelling is noted. Visualized joint spaces are preserved. IMPRESSION: Diffuse erosion involving the third distal phalanx, with a moth-eaten appearance, compatible with osteomyelitis. Electronically Signed   By: Garald Balding M.D.   On: 10/03/2016 22:28     Subjective: Patient doing well this morning. She has no specific complaints this morning; denies any fevers, chest pain, cough, shortness of breath, nausea, vomiting, diarrhea, abdominal pain, dysuria. Patient is tolerating meals. No pain.   Discharge Exam: Vitals:   10/05/16 0535 10/05/16 0905  BP: (!) 143/75 114/61  Pulse: 77 79  Resp: 18 18  Temp: 98.3 F (36.8 C) 98.4 F (36.9 C)   Vitals:   10/04/16 1748 10/04/16 2141 10/05/16 0535 10/05/16 0905  BP: 133/79 (!) 141/73 (!) 143/75 114/61  Pulse: 78 77 77 79  Resp: 14 19 18 18   Temp: 98.6 F (37 C) 98 F (36.7 C) 98.3 F (36.8 C) 98.4 F (36.9  C)  TempSrc: Oral Oral Oral Oral  SpO2: 100% 100% 97% 98%  Weight:  77.7 kg (171 lb 4.8 oz)    Height:        General exam: Appears calm and comfortable  Respiratory system: Clear to auscultation. Respiratory effort normal. Cardiovascular system: S1 & S2 heard, RRR. No JVD, murmurs, rubs, gallops or clicks. No pedal edema. Gastrointestinal system: Abdomen is nondistended, soft and nontender. No organomegaly or masses felt. Normal bowel sounds heard. Central nervous system: Alert and oriented. No focal neurological deficits. Extremities: s/p bilateral LE amputation below knee. +right middle finger bandages, right hand swelling  Psychiatry: Judgement and insight appear normal. Mood & affect appropriate.     The results of significant diagnostics from this hospitalization (including imaging, microbiology,  ancillary and laboratory) are listed below for reference.     Microbiology: Recent Results (from the past 240 hour(s))  Surgical pcr screen     Status: Abnormal   Collection Time: 10/04/16 11:09 AM  Result Value Ref Range Status   MRSA, PCR NEGATIVE NEGATIVE Final   Staphylococcus aureus POSITIVE (A) NEGATIVE Final    Comment:        The Xpert SA Assay (FDA approved for NASAL specimens in patients over 50 years of age), is one component of a comprehensive surveillance program.  Test performance has been validated by [facility] for patients greater than or equal to 52 year old. It is not intended to diagnose infection nor to guide or monitor treatment.      Labs: BNP (last 3 results) No results for input(s): BNP in the last 8760 hours. Basic Metabolic Panel:  Recent Labs Lab 10/03/16 2200 10/05/16 0627  NA 144 141  K 4.3 4.0  CL 109 107  CO2 28 27  GLUCOSE 96 167*  BUN 9 11  CREATININE 0.76 0.82  CALCIUM 9.4 8.4*   Liver Function Tests: No results for input(s): AST, ALT, ALKPHOS, BILITOT, PROT, ALBUMIN in the last 168 hours. No results for input(s): LIPASE, AMYLASE in the last 168 hours. No results for input(s): AMMONIA in the last 168 hours. CBC:  Recent Labs Lab 10/03/16 2200 10/05/16 0627  WBC 9.1 6.5  NEUTROABS 5.4 4.2  HGB 11.6* 10.5*  HCT 35.5* 33.1*  MCV 85.3 85.3  PLT 401* 358   Cardiac Enzymes: No results for input(s): CKTOTAL, CKMB, CKMBINDEX, TROPONINI in the last 168 hours. BNP: Invalid input(s): POCBNP CBG:  Recent Labs Lab 10/04/16 1132 10/04/16 1324 10/04/16 1650 10/04/16 2140 10/05/16 0745  GLUCAP 110* 98 97 233* 149*   D-Dimer No results for input(s): DDIMER in the last 72 hours. Hgb A1c No results for input(s): HGBA1C in the last 72 hours. Lipid Profile No results for input(s): CHOL, HDL, LDLCALC, TRIG, CHOLHDL, LDLDIRECT in the last 72 hours. Thyroid function studies No results for input(s): TSH, T4TOTAL, T3FREE,  THYROIDAB in the last 72 hours.  Invalid input(s): FREET3 Anemia work up  Recent Labs  10/04/16 0502  VITAMINB12 205  FERRITIN 182  TIBC 188*  IRON 21*   Urinalysis    Component Value Date/Time   COLORURINE YELLOW 07/31/2015 [place] 07/31/2015 0854   LABSPEC 1.022 07/31/2015 0854   PHURINE 5.0 07/31/2015 0854   GLUCOSEU NEGATIVE 07/31/2015 0854   HGBUR SMALL (A) 07/31/2015 [place] NEGATIVE 07/31/2015 [place] 07/31/2015 0854   PROTEINUR NEGATIVE 07/31/2015 0854   UROBILINOGEN 1.0 07/31/2015 0854   NITRITE NEGATIVE 07/31/2015 0854  LEUKOCYTESUR SMALL (A) 07/31/2015 0854   Sepsis Labs Invalid input(s): PROCALCITONIN,  WBC,  LACTICIDVEN Microbiology Recent Results (from the past 240 hour(s))  Surgical pcr screen     Status: Abnormal   Collection Time: 10/04/16 11:09 AM  Result Value Ref Range Status   MRSA, PCR NEGATIVE NEGATIVE Final   Staphylococcus aureus POSITIVE (A) NEGATIVE Final    Comment:        The Xpert SA Assay (FDA approved for NASAL specimens in patients over 75 years of age), is one component of a comprehensive surveillance program.  Test performance has been validated by [facility] for patients greater than or equal to 26 year old. It is not intended to diagnose infection nor to guide or monitor treatment.      Time coordinating discharge: Over 30 minutes  SIGNED:  Dessa Phi, DO [facility] [email or web address] Password [email or web address] 10/05/2016, 10:36 AM

## 2016-10-05 NOTE — Discharge Instructions (Signed)
Follow up with PCP in 1-2 weeks. If no improvement, she will need referral to infectious disease for further antibiotic management as her initial injury was due to manicure/water-related and no culture was obtained during this hospitalization to rule out atypical bacterial infection.  Follow up with Orthopedic surgery/hand surgery (Dr. Iran Planas) in 10 days. Keep the bandage on and keep dry until follow up visit.   Follow up Ha1c result. You need improved blood sugar control to ensure healing of wound.   Bone and Joint Infections, Adult Bone infections (osteomyelitis) and joint infections (septic arthritis) occur when bacteria or other germs get inside a bone or a joint. This can happen if you have an infection in another part of your body that spreads through your blood. Germs from your skin or from outside of your body can also cause this type of infection if you have a wound or a broken bone (fracture) that breaks the skin. Anyone can get a bone infection or joint infection. You may be more likely to get this type of infection if you have a condition, such as diabetes, that lowers your ability to fight infection or increases your chances of getting an infection. Bone and joint infections can cause damage, and they can spread to other areas of your body. They need to be treated quickly. CAUSES Most bone and joint infections are caused by bacteria. They can also be caused by other germs, such as viruses and funguses. RISK FACTORS This condition is more likely to develop in:  People who recently had surgery, especially bone or joint surgery.  People who have a long-term (chronic) disease, such as:  HIV (human immunodeficiency virus).  Diabetes.  Rheumatoid arthritis.  Sickle cell anemia.  Elderly people.  People who take medicines that block or weaken the body's defense system (immune system).  People who have a condition that reduces their blood flow.  People who are on kidney  dialysis.  People who have an artificial joint.  People who have had a joint or bone repaired with plates or screws (surgical hardware).  People who use or abuse IV drugs.  People who have had trauma, such as stepping on a nail. SYMPTOMS Symptoms vary depending on the type and location of your infection. Common symptoms of bone and joint infections include:  Fever and chills.  Redness and warmth.  Swelling.  Pain and stiffness.  Drainage of fluid or pus near the infection.  Weight loss and fatigue.  Decreased ability to use a hand or foot. DIAGNOSIS This condition may be diagnosed based on symptoms, medical history, a physical exam, and diagnostic tests. Tests can help to identify the cause of the infection. You may have various tests, such as:  A sample of tissue, fluid, or blood taken to be examined under a microscope.  A procedure to remove fluid from the infected joint with a needle (joint aspiration) for testing in a lab.  Pus or discharge swabbed from a wound for testing to identify germs and to determine what type of medicine will kill them (culture and sensitivity).  Blood tests to look for evidence of infection and inflammation (biomarkers).  Imaging studies to determine how severe the bone or joint infection is. These may include:  X-rays.  CT scan.  MRI.  Bone scan. TREATMENT Treatment depends on the cause and type of infection. Antibiotic medicines are usually the first treatment for a bone or joint infection. Treatment with antibiotics may include:  Getting IV antibiotics. This may  be done in a hospital at first. You may have to continue IV antibiotics at home for several weeks. You may also have to take antibiotics by mouth for several weeks after that.  Taking more than one kind of antibiotic. Treatment may start with a type of antibiotic that works against many different bacteria (broad spectrumantibiotics). IV antibiotics may be changed if tests  show that another type may work better. Other treatments may include:  Draining fluid from the joint by placing a needle into it (aspiration).  Surgery to remove:  Dead or dying tissue from a bone or joint.  An infected artificial joint.  Infected plates or screws that were used to repair a broken bone. HOME CARE INSTRUCTIONS  Take medicines only as directed by your health care provider.  Take your antibiotic medicine as directed by your health care provider. Finish the antibiotic even if you start to feel better.  Follow instructions from your health care provider about how to take IV antibiotics at home.  Ask your health care provider if you have any restrictions on your activities.  Keep all follow-up visits as directed by your health care provider. This is important. SEEK MEDICAL CARE IF:  You have a fever or chills.  You have redness, warmth, pain, or swelling that returns after treatment. SEEK IMMEDIATE MEDICAL CARE IF:  You have rapid breathing or you have trouble breathing.  You have chest pain.  You cannot drink fluids or make urine.  The affected arm or leg swells, changes color, or turns blue.   This information is not intended to replace advice given to you by your health care provider. Make sure you discuss any questions you have with your health care provider.   Document Released: 12/08/2005 Document Revised: 04/24/2015 Document Reviewed: 12/06/2014 Elsevier Interactive Patient Education [employer].

## 2016-10-05 NOTE — Op Note (Signed)
NAMEKIMYADA, Diana Coleman                ACCOUNT NO.:  [id number]  MEDICAL RECORD NO.:  [id number]  LOCATION:  6E06C                        FACILITY:  [facility]  PHYSICIAN:  Linna Hoff IV, M.D.DATE OF BIRTH:  [birth year]  DATE OF PROCEDURE:  10/04/2016 DATE OF DISCHARGE:                              OPERATIVE REPORT   PREOPERATIVE DIAGNOSIS:  Right long finger infection, flexor sheath infection, and osteomyelitis.  POSTOPERATIVE DIAGNOSIS:  Right long finger infection, flexor sheath infection, and osteomyelitis.  ATTENDING PHYSICIAN:  Linna Hoff, MD, who scrubbed and present for the entire procedure.  ASSISTANT SURGEON:  None.  ANESTHESIA:  General via LMA.  SURGICAL PROCEDURE:  Diana Coleman is an insulin-dependent diabetic who had previously undergone bilateral leg amputations, had a worsening swelling over the left side months after getting her nails done.  The patient presented to the ER with an open draining wound and the obvious osteomyelitis of the long finger.  The patient was seen and evaluated and recommended to undergo the above procedure.  Risks, benefits, and alternatives were discussed in detail with the patient and signed informed consent was obtained.  Risks include, but not limited to bleeding, infection, damage to nearby nerves, arteries, or tendons; loss of motion of wrist and digits, incomplete relief of symptoms, and need for further surgical intervention.  DESCRIPTION OF PROCEDURE:  The patient was properly identified in the preoperative holding area and marked with a permanent marker on right index finger to indicate the correct operative site.  The patient was brought back to the operating room, placed supine on the anesthesia room table.  General anesthesia was administered.  The patient tolerated this well.  A well-padded tourniquet placed on right brachium and sealed with 1000 drape.  The right upper extremity was then prepped and draped in normal  sterile fashion.  Time-out was called and correct side was identified, and procedure then begun.  Attention was then turned to the long finger.  [place] incision was made directly centered over the middle phalanx.  The tourniquet insufflated.  Dissection carried down through the skin and subcutaneous tissue.  Flexor tendon with the FDP was then divided, nerves were resected and then going all the way down to the level of the middle phalanx, the finger was then amputated.  Bone cutters were then used to remove portion of the middle phalanx making sure to preserve the insertion of the FDS.  The wound was then thoroughly irrigated.  The flexor sheath was then thoroughly irrigated. Copious wound irrigation done on the flexor sheath was appeared to have some mild purulent and a regular tissue within the flexor sheath. Debridement of the flexor sheath and drainage of the flexor sheath were then done.  After thorough wound irrigation, the skin flaps were then advanced in a V-Y fashion.  Local neurectomies were then carried out and after advancement of flap closure delayed fashion from a volar to dorsal direction.  Skin was then closed using simple Prolene sutures.  Adaptic dressing, sterile compressive bandage applied.  The patient tolerated the procedure well, returned to recovery room in good condition.  POSTPROCEDURE PLAN:  The patient admitted back to the Internal Medicine Service.  She will need to look at the wound in the next 10-14 days, keep the bandage on until I see her back in the office.  I will see her back in the office in approximately 10-14 days.  Radiographs at the first visit.     Diana Coleman, M.D.     FWO/MEDQ  D:  10/04/2016  T:  10/05/2016  Job:  [id number]

## 2016-10-06 ENCOUNTER — Encounter (HOSPITAL_COMMUNITY): Payer: Self-pay | Admitting: Orthopedic Surgery

## 2016-10-06 LAB — FOLATE RBC
Folate, Hemolysate: 348.7 ng/mL
Folate, RBC: 1070 ng/mL (ref >498)
Hematocrit: 32.6 % — ABNORMAL LOW (ref 34.0–46.6)

## 2016-10-13 DIAGNOSIS — S68612D Complete traumatic transphalangeal amputation of right middle finger, subsequent encounter: Secondary | ICD-10-CM | POA: Diagnosis not present

## 2016-10-14 ENCOUNTER — Encounter (HOSPITAL_COMMUNITY): Payer: Self-pay | Admitting: Orthopedic Surgery

## 2016-10-14 DIAGNOSIS — S68112A Complete traumatic metacarpophalangeal amputation of right middle finger, initial encounter: Secondary | ICD-10-CM | POA: Diagnosis not present

## 2016-10-14 DIAGNOSIS — E1140 Type 2 diabetes mellitus with diabetic neuropathy, unspecified: Secondary | ICD-10-CM | POA: Diagnosis not present

## 2016-10-14 DIAGNOSIS — I10 Essential (primary) hypertension: Secondary | ICD-10-CM | POA: Diagnosis not present

## 2016-10-14 NOTE — OR Nursing (Signed)
LATE ENTRY: Corrected error in [place] time for case start; 1226 changed to 1246.

## 2016-10-28 DIAGNOSIS — T23029S Burn of unspecified degree of unspecified single finger (nail) except thumb, sequela: Secondary | ICD-10-CM | POA: Diagnosis not present

## 2016-10-28 DIAGNOSIS — T23029A Burn of unspecified degree of unspecified single finger (nail) except thumb, initial encounter: Secondary | ICD-10-CM | POA: Diagnosis not present

## 2016-10-28 DIAGNOSIS — E118 Type 2 diabetes mellitus with unspecified complications: Secondary | ICD-10-CM | POA: Diagnosis not present

## 2016-11-12 ENCOUNTER — Encounter (INDEPENDENT_AMBULATORY_CARE_PROVIDER_SITE_OTHER): Payer: Self-pay | Admitting: Orthopedic Surgery

## 2016-11-12 ENCOUNTER — Ambulatory Visit (INDEPENDENT_AMBULATORY_CARE_PROVIDER_SITE_OTHER): Payer: Commercial Managed Care - HMO | Admitting: Orthopedic Surgery

## 2016-11-12 VITALS — Ht 67.0 in | Wt 184.0 lb

## 2016-11-12 DIAGNOSIS — L97221 Non-pressure chronic ulcer of left calf limited to breakdown of skin: Secondary | ICD-10-CM

## 2016-11-12 DIAGNOSIS — Z89511 Acquired absence of right leg below knee: Secondary | ICD-10-CM | POA: Insufficient documentation

## 2016-11-12 DIAGNOSIS — Z89512 Acquired absence of left leg below knee: Secondary | ICD-10-CM | POA: Insufficient documentation

## 2016-11-12 NOTE — Progress Notes (Signed)
Office Visit Note   Patient: NATIYAH Coleman           Date of Birth: Mar 29, 1951           MRN: [id number] Visit Date: 11/12/2016              Requested by: Lucianne Lei, MD [address] [place], [place] [address] PCP: Elyn Peers, MD   Assessment & Plan: Visit Diagnoses:  1. Status post bilateral below knee amputation ([place])   2. Non-pressure chronic ulcer of left calf, limited to breakdown of skin ([place])     Plan: Patient has lost significant amount of volume in both legs out in bearing ulcer on the left transtibial amputation. Patient is given a prescription to get a biotech for new sockets There is new sleeves. Patient will use Silvadene ointment and a Band-Aid to protect the end of her leg minimize weightbearing until this heals follow-up in the office as needed.  Follow-Up Instructions: Return if symptoms worsen or fail to improve.   Orders:  No orders of the defined types were placed in this encounter.  No orders of the defined types were placed in this encounter.     Procedures: No procedures performed   Clinical Data: No additional findings.   Subjective: Chief Complaint  Patient presents with  . Left Leg - Wound Check    Patient comes in today with a small wound on left stump. States seen puss on Sunday and has some minimal drainage since. Not sure what happened if scrapped it on something. Wanted to get it checked to be safe.     Review of Systems   Objective: Vital Signs: Ht 5\' 7"  (1.702 m)   Wt 184 lb (83.5 kg)   BMI 28.82 kg/m   Physical Exam on examination patient is alert oriented no adenopathy well-dressed the left neck, respiratory effort she does have an antalgic gait examination right transtibial amputation is no ulcerations. Left transtibial amputation is loss of volume in her leg she has a very small superficial ulcer over the end of her leg from an bearing. The ulcer is 10 mm in diameter 0.1 mm deep. There is no cellulitis no drainage  no odor no signs of infection. Patient is currently wearing over 20 ply sock.  Ortho Exam  Specialty Comments:  No specialty comments available.  Imaging: No results found.   PMFS History: Patient Active Problem List   Diagnosis Date Noted  . Non-pressure chronic ulcer of left calf, limited to breakdown of skin ([place]) 11/12/2016  . Status post bilateral below knee amputation ([place]) 11/12/2016  . Normocytic anemia 10/04/2016  . Osteomyelitis ([place]) 10/04/2016  . Finger infection 10/04/2016  . Osteomyelitis of finger ([place]) 10/04/2016  . Gastroparesis 11/02/2014  . Diabetes mellitus due to underlying condition with other specified complication ([place])   . Diabetic foot ([place])   . Nausea & vomiting   . Essential hypertension 10/25/2014  . GERD (gastroesophageal reflux disease) 10/25/2014  . Cellulitis 10/24/2014  . Diabetes ([place]) 10/24/2014  . Insulin dependent diabetes mellitus ([place]) 10/24/2014   Past Medical History:  Diagnosis Date  . Cellulitis of foot, left 10/24/2014   hx/notes 10/24/2014  . Charcot foot due to diabetes mellitus ([place])   . Osteomyelitis of right foot ([place])    hx/notes 10/24/2014  . Type II diabetes mellitus (H[facility])     Family History  Problem Relation Age of Onset  . Uterine cancer Mother   . Gout Father   .  Thyroid disease Sister     Past Surgical History:  Procedure Laterality Date  . ABOVE KNEE LEG AMPUTATION Right 2004  . AMPUTATION Left 10/29/2014   Procedure: AMPUTATION BELOW KNEE - LEFT;  Surgeon: Newt Minion, MD;  Location: [facility];  Service: Orthopedics;  Laterality: Left;  . FOOT SURGERY Left 1980's   "ulcer removed"  . I&D EXTREMITY Right 10/04/2016   Procedure: IRRIGATION AND DEBRIDEMENT MIDDLE FINGER AND AMPUTATION;  Surgeon: Iran Planas, MD;  Location: [facility];  Service: Orthopedics;  Laterality: Right;  . REDUCTION MAMMAPLASTY Bilateral 1987  . TOE AMPUTATION Left ~ 2011   "top of my 3rd toe"  . TUBAL LIGATION  1973   Social History    Occupational History  . Not on file.   Social History Main Topics  . Smoking status: Former Smoker    Packs/day: 0.50    Years: 30.00    Types: Cigarettes  . Smokeless tobacco: Never Used     Comment: "quit smoking ~ 2000"  . Alcohol use No  . Drug use: No  . Sexual activity: No

## 2016-11-25 DIAGNOSIS — E785 Hyperlipidemia, unspecified: Secondary | ICD-10-CM | POA: Diagnosis not present

## 2016-11-25 DIAGNOSIS — R945 Abnormal results of liver function studies: Secondary | ICD-10-CM | POA: Diagnosis not present

## 2016-11-25 DIAGNOSIS — Z899 Acquired absence of limb, unspecified: Secondary | ICD-10-CM | POA: Diagnosis not present

## 2016-11-25 DIAGNOSIS — E118 Type 2 diabetes mellitus with unspecified complications: Secondary | ICD-10-CM | POA: Diagnosis not present

## 2016-11-25 DIAGNOSIS — E0959 Drug or chemical induced diabetes mellitus with other circulatory complications: Secondary | ICD-10-CM | POA: Diagnosis not present

## 2016-12-02 ENCOUNTER — Encounter (INDEPENDENT_AMBULATORY_CARE_PROVIDER_SITE_OTHER): Payer: Commercial Managed Care - HMO | Admitting: Ophthalmology

## 2017-02-05 ENCOUNTER — Telehealth (INDEPENDENT_AMBULATORY_CARE_PROVIDER_SITE_OTHER): Payer: Self-pay | Admitting: Orthopedic Surgery

## 2017-02-05 NOTE — Telephone Encounter (Signed)
Patient called asked if Dr Sharol Given would write her a Rx for Diabetic shoes. The number to contact her is [phone]

## 2017-02-10 NOTE — Telephone Encounter (Signed)
I called and spoke with patient she does need shoes for bilateral prosthetics. This does not need to be a specialty shoe. Patient would like to file under insurance. [employer] did confirm this. I will fax rx this afternoon.

## 2017-03-26 DIAGNOSIS — Z89512 Acquired absence of left leg below knee: Secondary | ICD-10-CM | POA: Diagnosis not present

## 2017-08-10 ENCOUNTER — Other Ambulatory Visit: Payer: Self-pay | Admitting: *Deleted

## 2017-08-10 NOTE — Patient Outreach (Signed)
[place] [facility]) Care Management  08/10/2017  Diana Coleman 11/01/1951 [id number]  Telephone Screen  Referral Date: 08/07/17 Referral Source: EMMI Referral Referral Reason: HTN, DM Insurance:[employer] Medicare, Medicaid  Outreach attempt #1 to patient. No answer. RN CM left HIPAA compliant message along with contact info.    Plan: RN CM will contact patient within one week.   [name], RN, BSN, MHA/MSL, [place] Telephonic Care Manager Coordinator [facility] Direct Phone: [phone] Toll Free: [phone] Fax: [phone]

## 2017-08-17 ENCOUNTER — Ambulatory Visit: Payer: Self-pay | Admitting: *Deleted

## 2017-08-17 ENCOUNTER — Other Ambulatory Visit: Payer: Self-pay | Admitting: *Deleted

## 2017-08-17 NOTE — Patient Outreach (Signed)
[facility]) Care Management  08/17/2017  QUYEN CUTSFORTH [birth year] [id number]  Telephone Screen  Referral Date: 08/07/17 Referral Source: EMMI Referral Referral Reason: HTN, DM Insurance:Hum[employer] Medicare, [place]  Outreach attempt # 3 to patient, spoke with patient. HIPAA verified with patient.   Social:  Patient lives with his wife. She is independent with ADLs. She transports herself to all of her medical appointments.   Conditions: Past Medical Hx: BKA, DM, HTN, Sterilization, Kidney Dx Patient reported, she is doing well. Her DM and B/P is controlled. Patient is a BKA. She uses prosthetics and other DME to assist with mobility.   Medications: Patient takes 4 medications per day. She can afford all of his medications. Patient takes her medications as prescribed. Patient concerned about obtaining 2 of medications, due to switching pharmacies.   Appointments: Patient's last PCP appointment was 4 months ago. She has a prosthetic appointment in October 2018.   Advanced Directives: Pt reported not having an Advanced Directive. He declined to receive any information regarding AD.   Consent: [facility] services reviewed and discussed with patient. Verbal consent received  Plan: RN CM will send referral to [place] for possible medication assistance.  [name], RN, BSN, MHA/MSL, [place] Telephonic Care Manager Coordinator [facility] Direct Phone: [phone] Toll Free: [phone] Fax: [phone]

## 2017-08-17 NOTE — Patient Outreach (Signed)
[facility]) Care Management  08/17/2017  POSIE LILLIBRIDGE 08-Dec-1951 [id number]  Telephone Screen  Referral Date: 08/07/17 Referral Source: EMMI Referral Referral Reason: HTN, DM Insurance:Humana Medicare, Medicaid  Outreach attempt # 2 to patient. No answer. RN CM left HIPAA compliant message along with contact info.   Plan: RN CM will contact patient within one week.   [name], RN, BSN, MHA/MSL, [place] Telephonic Care Manager Coordinator [facility] Direct Phone: [phone] Toll Free: [phone] Fax: [phone]

## 2017-08-25 ENCOUNTER — Ambulatory Visit: Payer: Self-pay | Admitting: *Deleted

## 2017-08-27 ENCOUNTER — Other Ambulatory Visit: Payer: Self-pay | Admitting: Pharmacist

## 2017-08-27 NOTE — Patient Outreach (Signed)
[facility]) Care Management  [place]   08/27/2017  Diana Coleman [birth year] [id number]  Subjective:  Patient was referred to [place] Pharmacist by [facility] RN [place] for patient reported concerns with obtaining medication.    Successful phone outreach to patient---purpose of call explained to patient and HIPAA details verified.  Patient reports she changed from [facility] insurance to [facility] insurance and needs to know how to get refills.  She reports she previously used [facility] and she reports she prefers to continue using mail order pharmacy.   She reports she needs Trulicity refill.   Patient's past medical history is significant for hypertension, diabetes mellitus, GERD, bilateral below knee amputation.     Objective:   Current Medications: Current Outpatient Prescriptions  Medication Sig Dispense Refill  . amLODipine (NORVASC) 5 MG tablet Take 1 tablet (5 mg total) by mouth daily. (Patient taking differently: Take 10 mg by mouth daily. )    . Dulaglutide (TRULICITY) 1.5 ZW/2.5EN SOPN Inject 1.5 mg into the skin once a week.    . famotidine (PEPCID) 20 MG tablet Take 20 mg by mouth daily.   2  . Insulin Degludec (TRESIBA FLEXTOUCH) 100 UNIT/ML SOPN Inject 17 Units into the skin daily.    Marland Kitchen lisinopril (PRINIVIL,ZESTRIL) 10 MG tablet Take 1 tablet (10 mg total) by mouth daily. 30 tablet 1  . metFORMIN (GLUCOPHAGE) 1000 MG tablet Take 1,000 mg by mouth 2 (two) times daily with a meal.     No current facility-administered medications for this visit.     Functional Status: In your present state of health, do you have any difficulty performing the following activities: 10/04/2016  Hearing? N  Vision? N  Difficulty concentrating or making decisions? N  Walking or climbing stairs? Y  Dressing or bathing? N  Doing errands, shopping? N  Some recent data might be hidden    Fall/Depression Screening: Fall Risk  08/17/2017  Falls in the past year?  No  Risk for fall due to : Other (Comment)  Risk for fall due to: Comment BKA   PHQ 2/9 Scores 08/17/2017  PHQ - 2 Score 0    Assessment:  Medication review per patient report.   Drugs sorted by system:  Cardiovascular: -amlodipine -lisinopril   Gastrointestinal: -famotidine   Endocrine: -dulaglutide (Trulicity)  -insulin degludec Tyler Aas)  -metformin   Gaps in therapy: Patient with diabetes not on statin therapy---consider if statin therapy is appropriate if there are no contraindication.   Other issues noted:  1) Patient reports experiencing a dry, hacking cough---noted patient is on lisinopril---suggest consideration be given to discontinuing lisinopril and initiating an ARB such as losartan in its place.    Medication refills:  -Patient counseled to contact her new insurance and request to be enrolled in mail order pharmacy---likely [facility] with her [facility] MA-PDP.  Patient prefers to continue using mail order pharmacy.  She was advised to contact customer service number on her insurance card to enroll in mail order. Patient repots she is familiar with this process and denies needing assistance.  She was counseled she may need to select a local pharmacy to obtain refills if her medication will not arrive from mail order before she runs out.   Plan:  1) Will route note to PCP.    2) Patient will need new prescriptions sent to her new mail order pharmacy.   3) Will close pharmacy episode as patient declines further needs---she was counseled she can contact  [facility] Pharmacist if new pharmacy needs arise.    She plans to discuss lisinopril with her PCP per her report.   Karrie Meres, PharmD, [place] [phone]

## 2017-09-22 DIAGNOSIS — E785 Hyperlipidemia, unspecified: Secondary | ICD-10-CM | POA: Diagnosis not present

## 2017-09-22 DIAGNOSIS — Z23 Encounter for immunization: Secondary | ICD-10-CM | POA: Diagnosis not present

## 2017-09-22 DIAGNOSIS — E7800 Pure hypercholesterolemia, unspecified: Secondary | ICD-10-CM | POA: Diagnosis not present

## 2017-09-22 DIAGNOSIS — I10 Essential (primary) hypertension: Secondary | ICD-10-CM | POA: Diagnosis not present

## 2017-09-22 DIAGNOSIS — E1140 Type 2 diabetes mellitus with diabetic neuropathy, unspecified: Secondary | ICD-10-CM | POA: Diagnosis not present

## 2017-09-29 ENCOUNTER — Encounter (HOSPITAL_COMMUNITY): Payer: Self-pay

## 2017-10-01 ENCOUNTER — Ambulatory Visit (HOSPITAL_COMMUNITY)
Admission: RE | Admit: 2017-10-01 | Discharge: 2017-10-01 | Disposition: A | Discharge status: Home / Self Care | Payer: Medicare Other | Source: Ambulatory Visit | Attending: Vascular Surgery | Admitting: Vascular Surgery

## 2017-10-01 ENCOUNTER — Other Ambulatory Visit (HOSPITAL_COMMUNITY): Payer: Self-pay | Admitting: Family Medicine

## 2017-10-01 DIAGNOSIS — I739 Peripheral vascular disease, unspecified: Secondary | ICD-10-CM

## 2017-10-07 DIAGNOSIS — E785 Hyperlipidemia, unspecified: Secondary | ICD-10-CM | POA: Diagnosis not present

## 2017-10-07 DIAGNOSIS — E118 Type 2 diabetes mellitus with unspecified complications: Secondary | ICD-10-CM | POA: Diagnosis not present

## 2017-10-07 DIAGNOSIS — I10 Essential (primary) hypertension: Secondary | ICD-10-CM | POA: Diagnosis not present

## 2017-10-07 DIAGNOSIS — J399 Disease of upper respiratory tract, unspecified: Secondary | ICD-10-CM | POA: Diagnosis not present

## 2017-10-08 ENCOUNTER — Encounter (HOSPITAL_COMMUNITY): Payer: Self-pay

## 2017-10-24 DIAGNOSIS — K219 Gastro-esophageal reflux disease without esophagitis: Secondary | ICD-10-CM | POA: Diagnosis not present

## 2017-10-24 DIAGNOSIS — E785 Hyperlipidemia, unspecified: Secondary | ICD-10-CM | POA: Diagnosis not present

## 2017-10-24 DIAGNOSIS — E1165 Type 2 diabetes mellitus with hyperglycemia: Secondary | ICD-10-CM | POA: Diagnosis not present

## 2017-10-24 DIAGNOSIS — I10 Essential (primary) hypertension: Secondary | ICD-10-CM | POA: Diagnosis not present

## 2017-12-25 DIAGNOSIS — H548 Legal blindness, as defined in USA: Secondary | ICD-10-CM | POA: Diagnosis not present

## 2017-12-26 DIAGNOSIS — I10 Essential (primary) hypertension: Secondary | ICD-10-CM | POA: Diagnosis not present

## 2017-12-26 DIAGNOSIS — K219 Gastro-esophageal reflux disease without esophagitis: Secondary | ICD-10-CM | POA: Diagnosis not present

## 2017-12-26 DIAGNOSIS — Z899 Acquired absence of limb, unspecified: Secondary | ICD-10-CM | POA: Diagnosis not present

## 2017-12-26 DIAGNOSIS — E785 Hyperlipidemia, unspecified: Secondary | ICD-10-CM | POA: Diagnosis not present

## 2017-12-26 DIAGNOSIS — E118 Type 2 diabetes mellitus with unspecified complications: Secondary | ICD-10-CM | POA: Diagnosis not present

## 2017-12-26 DIAGNOSIS — E1140 Type 2 diabetes mellitus with diabetic neuropathy, unspecified: Secondary | ICD-10-CM | POA: Diagnosis not present

## 2018-02-27 DIAGNOSIS — E118 Type 2 diabetes mellitus with unspecified complications: Secondary | ICD-10-CM | POA: Diagnosis not present

## 2018-02-27 DIAGNOSIS — I10 Essential (primary) hypertension: Secondary | ICD-10-CM | POA: Diagnosis not present

## 2018-03-15 DIAGNOSIS — Z1231 Encounter for screening mammogram for malignant neoplasm of breast: Secondary | ICD-10-CM | POA: Diagnosis not present

## 2018-03-23 DIAGNOSIS — R200 Anesthesia of skin: Secondary | ICD-10-CM | POA: Diagnosis not present

## 2018-04-27 DIAGNOSIS — G5622 Lesion of ulnar nerve, left upper limb: Secondary | ICD-10-CM | POA: Diagnosis not present

## 2018-04-27 DIAGNOSIS — G5621 Lesion of ulnar nerve, right upper limb: Secondary | ICD-10-CM | POA: Diagnosis not present

## 2018-05-11 DIAGNOSIS — G5622 Lesion of ulnar nerve, left upper limb: Secondary | ICD-10-CM | POA: Diagnosis not present

## 2018-05-11 DIAGNOSIS — G5602 Carpal tunnel syndrome, left upper limb: Secondary | ICD-10-CM | POA: Diagnosis not present

## 2018-05-11 DIAGNOSIS — G5621 Lesion of ulnar nerve, right upper limb: Secondary | ICD-10-CM | POA: Diagnosis not present

## 2018-06-05 DIAGNOSIS — E118 Type 2 diabetes mellitus with unspecified complications: Secondary | ICD-10-CM | POA: Diagnosis not present

## 2018-06-05 DIAGNOSIS — I110 Hypertensive heart disease with heart failure: Secondary | ICD-10-CM | POA: Diagnosis not present

## 2018-06-05 DIAGNOSIS — I10 Essential (primary) hypertension: Secondary | ICD-10-CM | POA: Diagnosis not present

## 2018-06-05 DIAGNOSIS — Z899 Acquired absence of limb, unspecified: Secondary | ICD-10-CM | POA: Diagnosis not present

## 2018-06-05 DIAGNOSIS — E1140 Type 2 diabetes mellitus with diabetic neuropathy, unspecified: Secondary | ICD-10-CM | POA: Diagnosis not present

## 2018-06-05 DIAGNOSIS — E785 Hyperlipidemia, unspecified: Secondary | ICD-10-CM | POA: Diagnosis not present

## 2018-07-06 ENCOUNTER — Other Ambulatory Visit: Payer: Self-pay

## 2018-07-06 ENCOUNTER — Other Ambulatory Visit: Payer: Self-pay | Admitting: *Deleted

## 2018-07-06 NOTE — Patient Outreach (Addendum)
[facility]) Care Management  07/06/2018  Diana Coleman December 18, 1951 [id number]   EMMI-  RED ON EMMI prevent call Date: 07/06/18 Engagement tool Score: 8   Outreach attempt #1 Patient is able to verify HIPAA Diana Coleman Care Management RN reviewed and addressed EMMI prevent score with patient When assessed for needs Diana Coleman denied concerns with medical, social and medications at this time. She states she is doing well with assist from her grand daughter and Dr Criss Rosales every 3 months  She denied need for education on her medical diagnoses at this time She denies need for assist with or assist with changes for advance directives  She He denies need of services from [facility] Community/Telephonic RN CM, pharmacy or SW at this time   Conditions DM, HTN hx of osteomyelitis, GERD , anemia,   Advised patient that there will be further automated EMMI- post discharge calls to assess how the patient is doing following the recent hospitalization Advised the patient that another call may be received from a Diana Coleman if any of their responses were abnormal. Patient voiced understanding and was appreciative of f/u call.  Plan: [facility] [place] RN CM will close case at this time as patient has been assessed and no needs identified.    Diana L. Lavina Hamman, RN, BSN, CCM Diana Coleman Telephonic Care Management Care Coordinator Direct number [phone]  Main [facility] number [phone] Fax number [phone]

## 2018-07-26 ENCOUNTER — Other Ambulatory Visit: Payer: Self-pay

## 2018-07-26 ENCOUNTER — Encounter (HOSPITAL_COMMUNITY): Payer: Self-pay

## 2018-07-26 DIAGNOSIS — E119 Type 2 diabetes mellitus without complications: Secondary | ICD-10-CM | POA: Diagnosis not present

## 2018-07-26 DIAGNOSIS — Y9389 Activity, other specified: Secondary | ICD-10-CM | POA: Insufficient documentation

## 2018-07-26 DIAGNOSIS — S161XXA Strain of muscle, fascia and tendon at neck level, initial encounter: Secondary | ICD-10-CM | POA: Diagnosis not present

## 2018-07-26 DIAGNOSIS — S299XXA Unspecified injury of thorax, initial encounter: Secondary | ICD-10-CM | POA: Diagnosis not present

## 2018-07-26 DIAGNOSIS — Y929 Unspecified place or not applicable: Secondary | ICD-10-CM | POA: Insufficient documentation

## 2018-07-26 DIAGNOSIS — I10 Essential (primary) hypertension: Secondary | ICD-10-CM | POA: Insufficient documentation

## 2018-07-26 DIAGNOSIS — Y999 Unspecified external cause status: Secondary | ICD-10-CM | POA: Diagnosis not present

## 2018-07-26 DIAGNOSIS — Z87891 Personal history of nicotine dependence: Secondary | ICD-10-CM | POA: Insufficient documentation

## 2018-07-26 DIAGNOSIS — S20219A Contusion of unspecified front wall of thorax, initial encounter: Secondary | ICD-10-CM | POA: Diagnosis not present

## 2018-07-26 DIAGNOSIS — M542 Cervicalgia: Secondary | ICD-10-CM | POA: Diagnosis not present

## 2018-07-26 DIAGNOSIS — Z79899 Other long term (current) drug therapy: Secondary | ICD-10-CM | POA: Insufficient documentation

## 2018-07-26 DIAGNOSIS — Z794 Long term (current) use of insulin: Secondary | ICD-10-CM | POA: Insufficient documentation

## 2018-07-26 DIAGNOSIS — W07XXXA Fall from chair, initial encounter: Secondary | ICD-10-CM | POA: Diagnosis not present

## 2018-07-26 DIAGNOSIS — M549 Dorsalgia, unspecified: Secondary | ICD-10-CM | POA: Insufficient documentation

## 2018-07-26 DIAGNOSIS — S199XXA Unspecified injury of neck, initial encounter: Secondary | ICD-10-CM | POA: Diagnosis present

## 2018-07-26 DIAGNOSIS — S0990XA Unspecified injury of head, initial encounter: Secondary | ICD-10-CM | POA: Diagnosis not present

## 2018-07-26 LAB — CBG MONITORING, ED: Glucose-Capillary: 289 mg/dL — ABNORMAL HIGH (ref 70–99)

## 2018-07-27 ENCOUNTER — Emergency Department (HOSPITAL_COMMUNITY)
Admission: EM | Admit: 2018-07-27 | Discharge: 2018-07-27 | Disposition: A | Discharge status: Home / Self Care | Payer: Medicare Other | Attending: Emergency Medicine | Admitting: Emergency Medicine

## 2018-07-27 ENCOUNTER — Emergency Department (HOSPITAL_COMMUNITY): Payer: Medicare Other

## 2018-07-27 DIAGNOSIS — S299XXA Unspecified injury of thorax, initial encounter: Secondary | ICD-10-CM | POA: Diagnosis not present

## 2018-07-27 DIAGNOSIS — S161XXA Strain of muscle, fascia and tendon at neck level, initial encounter: Secondary | ICD-10-CM

## 2018-07-27 DIAGNOSIS — M542 Cervicalgia: Secondary | ICD-10-CM | POA: Diagnosis not present

## 2018-07-27 DIAGNOSIS — W19XXXA Unspecified fall, initial encounter: Secondary | ICD-10-CM

## 2018-07-27 DIAGNOSIS — S20219A Contusion of unspecified front wall of thorax, initial encounter: Secondary | ICD-10-CM

## 2018-07-27 DIAGNOSIS — S0990XA Unspecified injury of head, initial encounter: Secondary | ICD-10-CM

## 2018-07-27 MED ORDER — HYDROCODONE-ACETAMINOPHEN 5-325 MG PO TABS: 1.0000 | ORAL_TABLET | Freq: Four times a day (QID) | ORAL | 0 refills | Status: DC | PRN

## 2018-07-27 MED ORDER — HYDROCODONE-ACETAMINOPHEN 5-325 MG PO TABS
2.0000 | ORAL_TABLET | Freq: Once | ORAL | Status: AC
  Administered 2018-07-27: 1 via ORAL
  Filled 2018-07-27: qty 2

## 2018-07-27 NOTE — Discharge Instructions (Addendum)
Hydrocodone is prescribed as needed for pain.  Follow up with your primary doctor if symptoms are not improving in the next week.

## 2018-07-27 NOTE — ED Provider Notes (Signed)
[facility] DEPT Provider Note   CSN: [id number] Arrival date & time: 07/26/18  2129     History   Chief Complaint Chief Complaint  Patient presents with  . Neck Pain  . Fall    HPI Diana Coleman is a 67 y.o. female.  Patient is a 67 year old female with past medical history of diabetes with bilateral below the knee amputations.  She presents today for evaluation of fall.  She has fallen several times recently.  This afternoon she was reaching to pick something up off the floor when the chair slid out from under her and she fell forward.  She reports neck pain.  She denies any head injury or headache.  She denies any numbness or tingling.  Is also experiencing pain in her upper back near the shoulder blade.  The history is provided by the patient.  Neck Pain   This is a new problem. The current episode started 3 to 5 hours ago. The problem occurs constantly. The problem has not changed since onset.The pain is associated with a fall. There has been no fever.  Fall     Past Medical History:  Diagnosis Date  . Cellulitis of foot, left 10/24/2014   hx/notes 10/24/2014  . Charcot foot due to diabetes mellitus ([place])   . Osteomyelitis of right foot ([place])    hx/notes 10/24/2014  . Type II diabetes mellitus [facility])     Patient Active Problem List   Diagnosis Date Noted  . Non-pressure chronic ulcer of left calf, limited to breakdown of skin ([place]) 11/12/2016  . Status post bilateral below knee amputation ([place]) 11/12/2016  . Normocytic anemia 10/04/2016  . Osteomyelitis ([place]) 10/04/2016  . Finger infection 10/04/2016  . Osteomyelitis of finger ([place]) 10/04/2016  . Gastroparesis 11/02/2014  . Diabetes mellitus due to underlying condition with other specified complication ([place])   . Diabetic foot ([place])   . Nausea & vomiting   . Essential hypertension 10/25/2014  . GERD (gastroesophageal reflux disease) 10/25/2014  . Cellulitis 10/24/2014  . Diabetes  ([place]) 10/24/2014  . Insulin dependent diabetes mellitus ([place]) 10/24/2014    Past Surgical History:  Procedure Laterality Date  . ABOVE KNEE LEG AMPUTATION Right 2004  . AMPUTATION Left 10/29/2014   Procedure: AMPUTATION BELOW KNEE - LEFT;  Surgeon: Newt Minion, MD;  Location: [facility];  Service: Orthopedics;  Laterality: Left;  . FOOT SURGERY Left 1980's   "ulcer removed"  . I&D EXTREMITY Right 10/04/2016   Procedure: IRRIGATION AND DEBRIDEMENT MIDDLE FINGER AND AMPUTATION;  Surgeon: Iran Planas, MD;  Location: [facility];  Service: Orthopedics;  Laterality: Right;  . REDUCTION MAMMAPLASTY Bilateral 1987  . TOE AMPUTATION Left ~ 2011   "top of my 3rd toe"  . TUBAL LIGATION  1973     OB History   None      Home Medications    Prior to Admission medications   Medication Sig Start Date End Date Taking? Authorizing Provider  amLODipine (NORVASC) 5 MG tablet Take 1 tablet (5 mg total) by mouth daily. Patient taking differently: Take 10 mg by mouth daily.  10/31/14   Ghimire, Henreitta Leber, MD  Dulaglutide (TRULICITY) 1.5 TK/3.5WS SOPN Inject 1.5 mg into the skin once a week.    [provider]  famotidine (PEPCID) 20 MG tablet Take 20 mg by mouth daily.  09/29/14   [provider]  Insulin Degludec (TRESIBA FLEXTOUCH) 100 UNIT/ML SOPN Inject 17 Units into the skin daily.  [provider]  lisinopril (PRINIVIL,ZESTRIL) 10 MG tablet Take 1 tablet (10 mg total) by mouth daily. 11/02/14   Kelvin Cellar, MD  metFORMIN (GLUCOPHAGE) 1000 MG tablet Take 1,000 mg by mouth 2 (two) times daily with a meal.    [provider]    Family History Family History  Problem Relation Age of Onset  . Uterine cancer Mother   . Gout Father   . Thyroid disease Sister     Social History Social History   Tobacco Use  . Smoking status: Former Smoker    Packs/day: 0.50    Years: 30.00    Pack years: 15.00    Types: Cigarettes  . Smokeless tobacco: Never Used  .  Tobacco comment: "quit smoking ~ 2000"  Substance Use Topics  . Alcohol use: No    Alcohol/week: 0.0 oz  . Drug use: No     Allergies   Clindamycin/lincomycin and Codeine   Review of Systems Review of Systems  Musculoskeletal: Positive for neck pain.  All other systems reviewed and are negative.    Physical Exam Updated Vital Signs BP (!) 171/93 (BP Location: Right Arm)   Pulse 88   Temp 99.6 F (37.6 C) (Oral)   Resp 15   Ht 5\' 7"  (1.702 m)   Wt 84.8 kg (187 lb)   SpO2 95%   BMI 29.29 kg/m   Physical Exam  Constitutional: She is oriented to person, place, and time. She appears well-developed and well-nourished. No distress.  HENT:  Head: Normocephalic and atraumatic.  Neck: Normal range of motion. Neck supple.  There is ttp in the soft tissues of the cervical region.  There is no bony tenderness or stepoff.  Cardiovascular: Normal rate and regular rhythm. Exam reveals no gallop and no friction rub.  No murmur heard. Pulmonary/Chest: Effort normal and breath sounds normal. No respiratory distress. She has no wheezes.  Abdominal: Soft. Bowel sounds are normal. She exhibits no distension. There is no tenderness.  Musculoskeletal: Normal range of motion.  Neurological: She is alert and oriented to person, place, and time. No cranial nerve deficit. She exhibits normal muscle tone. Coordination normal.  Skin: Skin is warm and dry. She is not diaphoretic.  Nursing note and vitals reviewed.    ED Treatments / Results  Labs (all labs ordered are listed, but only abnormal results are displayed) Labs Reviewed  CBG MONITORING, ED - Abnormal; Notable for the following components:      Result Value   Glucose-Capillary 289 (*)    All other components within normal limits    EKG None  Radiology No results found.  Procedures Procedures (including critical care time)  Medications Ordered in ED Medications - No data to display   Initial Impression / Assessment  and Plan / ED Course  I have reviewed the triage vital signs and the nursing notes.  Pertinent labs & imaging results that were available during my care of the patient were reviewed by me and considered in my medical decision making (see chart for details).  X-ray showed no evidence for fracture and CT scan of the head and cervical spine are unremarkable.  She will be discharged with pain medicine and follow-up as needed.  Final Clinical Impressions(s) / ED Diagnoses   Final diagnoses:  None    ED Discharge Orders    None       Veryl Speak, MD 07/27/18 [phone]

## 2018-08-14 DIAGNOSIS — E785 Hyperlipidemia, unspecified: Secondary | ICD-10-CM | POA: Diagnosis not present

## 2018-08-14 DIAGNOSIS — I10 Essential (primary) hypertension: Secondary | ICD-10-CM | POA: Diagnosis not present

## 2018-08-14 DIAGNOSIS — E119 Type 2 diabetes mellitus without complications: Secondary | ICD-10-CM | POA: Diagnosis not present

## 2018-08-14 DIAGNOSIS — E1140 Type 2 diabetes mellitus with diabetic neuropathy, unspecified: Secondary | ICD-10-CM | POA: Diagnosis not present

## 2018-08-14 DIAGNOSIS — Z899 Acquired absence of limb, unspecified: Secondary | ICD-10-CM | POA: Diagnosis not present

## 2018-08-24 DIAGNOSIS — T23031A Burn of unspecified degree of multiple right fingers (nail), not including thumb, initial encounter: Secondary | ICD-10-CM | POA: Diagnosis not present

## 2018-09-01 DIAGNOSIS — E133591 Other specified diabetes mellitus with proliferative diabetic retinopathy without macular edema, right eye: Secondary | ICD-10-CM | POA: Diagnosis not present

## 2018-09-01 DIAGNOSIS — E113592 Type 2 diabetes mellitus with proliferative diabetic retinopathy without macular edema, left eye: Secondary | ICD-10-CM | POA: Diagnosis not present

## 2018-09-01 DIAGNOSIS — H4311 Vitreous hemorrhage, right eye: Secondary | ICD-10-CM | POA: Diagnosis not present

## 2018-09-01 DIAGNOSIS — H26493 Other secondary cataract, bilateral: Secondary | ICD-10-CM | POA: Diagnosis not present

## 2018-09-02 ENCOUNTER — Encounter (INDEPENDENT_AMBULATORY_CARE_PROVIDER_SITE_OTHER): Payer: Medicare Other | Admitting: Ophthalmology

## 2018-09-02 DIAGNOSIS — H35033 Hypertensive retinopathy, bilateral: Secondary | ICD-10-CM | POA: Diagnosis not present

## 2018-09-02 DIAGNOSIS — I10 Essential (primary) hypertension: Secondary | ICD-10-CM

## 2018-09-02 DIAGNOSIS — H43813 Vitreous degeneration, bilateral: Secondary | ICD-10-CM

## 2018-09-02 DIAGNOSIS — E11319 Type 2 diabetes mellitus with unspecified diabetic retinopathy without macular edema: Secondary | ICD-10-CM | POA: Diagnosis not present

## 2018-09-02 DIAGNOSIS — H4311 Vitreous hemorrhage, right eye: Secondary | ICD-10-CM | POA: Diagnosis not present

## 2018-09-02 DIAGNOSIS — E113593 Type 2 diabetes mellitus with proliferative diabetic retinopathy without macular edema, bilateral: Secondary | ICD-10-CM

## 2018-09-06 DIAGNOSIS — I10 Essential (primary) hypertension: Secondary | ICD-10-CM | POA: Diagnosis not present

## 2018-09-06 DIAGNOSIS — E118 Type 2 diabetes mellitus with unspecified complications: Secondary | ICD-10-CM | POA: Diagnosis not present

## 2018-09-21 DIAGNOSIS — E113519 Type 2 diabetes mellitus with proliferative diabetic retinopathy with macular edema, unspecified eye: Secondary | ICD-10-CM | POA: Diagnosis present

## 2018-09-21 DIAGNOSIS — H4311 Vitreous hemorrhage, right eye: Secondary | ICD-10-CM | POA: Diagnosis present

## 2018-09-21 DIAGNOSIS — I10 Essential (primary) hypertension: Secondary | ICD-10-CM | POA: Diagnosis not present

## 2018-09-21 NOTE — H&P (Signed)
Diana Coleman is an 67 y.o. female.   Chief Complaint: loss of vision 2 month ago right eye HPI: loss of vision right eye  Past Medical History:  Diagnosis Date  . Cellulitis of foot, left 10/24/2014   hx/notes 10/24/2014  . Charcot foot due to diabetes mellitus ([place])   . Osteomyelitis of right foot ([place])    hx/notes 10/24/2014  . Type II diabetes mellitus ([place])     Past Surgical History:  Procedure Laterality Date  . ABOVE KNEE LEG AMPUTATION Right 2004  . AMPUTATION Left 10/29/2014   Procedure: AMPUTATION BELOW KNEE - LEFT;  Surgeon: Newt Minion, MD;  Location: [facility];  Service: Orthopedics;  Laterality: Left;  . FOOT SURGERY Left 1980's   "ulcer removed"  . I&D EXTREMITY Right 10/04/2016   Procedure: IRRIGATION AND DEBRIDEMENT MIDDLE FINGER AND AMPUTATION;  Surgeon: Iran Planas, MD;  Location: [facility];  Service: Orthopedics;  Laterality: Right;  . REDUCTION MAMMAPLASTY Bilateral 1987  . TOE AMPUTATION Left ~ 2011   "top of my 3rd toe"  . TUBAL LIGATION  1973    Family History  Problem Relation Age of Onset  . Uterine cancer Mother   . Gout Father   . Thyroid disease Sister    Social History:  reports that she has quit smoking. Her smoking use included cigarettes. She has a 15.00 pack-year smoking history. She has never used smokeless tobacco. She reports that she does not drink alcohol or use drugs.  Allergies:  Allergies  Allergen Reactions  . Clindamycin/Lincomycin Itching  . Codeine Itching    No medications prior to admission.    Review of systems otherwise negative  There were no vitals taken for this visit.  Physical exam: Mental status: oriented x3. Eyes: See eye exam associated with this date of surgery in media tab.  Scanned in by scanning center Ears, Nose, Throat: within normal limits Neck: Within Normal limits General: within normal limits Chest: Within normal limits Breast: deferred Heart: Within normal limits Abdomen: Within normal limits GU:  deferred Extremities: within normal limits Skin: within normal limits  Assessment/Plan Proliferative retinopathy with vitreous hemorrhage due to diabetes right eye Plan: To [facility] for Pars plana vitrectomy, membrane peel, laser, gas injection right eye  Hayden Pedro 09/21/2018, 12:29 PM

## 2018-09-23 DIAGNOSIS — Z794 Long term (current) use of insulin: Secondary | ICD-10-CM | POA: Diagnosis not present

## 2018-09-23 DIAGNOSIS — Z79899 Other long term (current) drug therapy: Secondary | ICD-10-CM | POA: Diagnosis not present

## 2018-09-23 DIAGNOSIS — E1165 Type 2 diabetes mellitus with hyperglycemia: Secondary | ICD-10-CM | POA: Diagnosis not present

## 2018-09-23 DIAGNOSIS — I10 Essential (primary) hypertension: Secondary | ICD-10-CM | POA: Diagnosis not present

## 2018-09-27 DIAGNOSIS — Z0000 Encounter for general adult medical examination without abnormal findings: Secondary | ICD-10-CM | POA: Diagnosis not present

## 2018-10-04 ENCOUNTER — Encounter (INDEPENDENT_AMBULATORY_CARE_PROVIDER_SITE_OTHER): Payer: Medicare Other | Admitting: Ophthalmology

## 2018-10-04 DIAGNOSIS — E113593 Type 2 diabetes mellitus with proliferative diabetic retinopathy without macular edema, bilateral: Secondary | ICD-10-CM

## 2018-10-04 DIAGNOSIS — H35033 Hypertensive retinopathy, bilateral: Secondary | ICD-10-CM

## 2018-10-04 DIAGNOSIS — E11311 Type 2 diabetes mellitus with unspecified diabetic retinopathy with macular edema: Secondary | ICD-10-CM | POA: Diagnosis not present

## 2018-10-04 DIAGNOSIS — I10 Essential (primary) hypertension: Secondary | ICD-10-CM | POA: Diagnosis not present

## 2018-10-04 DIAGNOSIS — H2513 Age-related nuclear cataract, bilateral: Secondary | ICD-10-CM

## 2018-10-04 DIAGNOSIS — H4311 Vitreous hemorrhage, right eye: Secondary | ICD-10-CM | POA: Diagnosis not present

## 2018-10-19 ENCOUNTER — Ambulatory Visit: Admit: 2018-10-19 | Payer: Medicare Other | Admitting: Ophthalmology

## 2018-10-19 DIAGNOSIS — Z79899 Other long term (current) drug therapy: Secondary | ICD-10-CM | POA: Diagnosis not present

## 2018-10-19 DIAGNOSIS — I10 Essential (primary) hypertension: Secondary | ICD-10-CM | POA: Diagnosis not present

## 2018-10-19 SURGERY — PARS PLANA VITRECTOMY 27 GAUGE
Anesthesia: General | Laterality: Right

## 2018-10-21 DIAGNOSIS — Z89511 Acquired absence of right leg below knee: Secondary | ICD-10-CM | POA: Diagnosis not present

## 2018-10-21 NOTE — Progress Notes (Signed)
Cardiology Office Note   Date:  10/22/2018   ID:  Diana Coleman, DOB 1951-09-11, MRN [id number]  PCP:  Lucianne Lei, MD  Cardiologist:   No primary care provider on file. Referring:  Lucianne Lei, MD  Chief Complaint  Patient presents with  . Chest Pain      History of Present Illness: Diana Coleman is a 67 y.o. female who is referred by Lucianne Lei, MD for evaluation of an abnormal EKG.  She has a history of PVD and amputations.  She is going to get eye surgery and was noted to have a abnormal EKG which suggested a possible old anteroseptal infarct.    The patient has no significant past cardiac history despite the long-standing diabetes.  She has had some chest discomfort.  She said in the past this was more severe.  She will get some discomfort when she was being active.  However, recently it has been more of a vague discomfort.  Seems to happen sporadically.  She cannot bring it on.  If she gets to moving very quickly she might get some shortness of breath.  Otherwise the discomfort seems to be at rest.  Some midsternal discomfort.  It is mild.  No associated nausea vomiting.  She is not having any palpitations, presyncope or syncope.  She actually takes care of a 22-year-old great grandchild twice a week and she moves around very well on bilateral prosthesis.  Past Medical History:  Diagnosis Date  . Cellulitis of foot, left 10/24/2014   hx/notes 10/24/2014  . Charcot foot due to diabetes mellitus ([place])   . Osteomyelitis of right foot ([place])    hx/notes 10/24/2014  . Type II diabetes mellitus ([place])    Since 1998    Past Surgical History:  Procedure Laterality Date  . ABOVE KNEE LEG AMPUTATION Right 2004  . AMPUTATION Left 10/29/2014   Procedure: AMPUTATION BELOW KNEE - LEFT;  Surgeon: Newt Minion, MD;  Location: [place];  Service: Orthopedics;  Laterality: Left;  . FOOT SURGERY Left 1980's   "ulcer removed"  . I&D EXTREMITY Right 10/04/2016   Procedure: IRRIGATION AND  DEBRIDEMENT MIDDLE FINGER AND AMPUTATION;  Surgeon: Iran Planas, MD;  Location: [place];  Service: Orthopedics;  Laterality: Right;  . REDUCTION MAMMAPLASTY Bilateral 1987  . TOE AMPUTATION Left ~ 2011   "top of my 3rd toe"  . TUBAL LIGATION  1973     Current Outpatient Medications  Medication Sig Dispense Refill  . amLODipine (NORVASC) 10 MG tablet Take 10 mg by mouth daily.    Marland Kitchen atorvastatin (LIPITOR) 10 MG tablet Take 1 tablet by mouth daily.    Marland Kitchen CALCIUM PO Take 1 tablet by mouth daily.    . Dulaglutide (TRULICITY) 1.5 KC/1.2XN SOPN Inject 1.5 mg into the skin once a week. Thursdays    . Insulin Degludec (TRESIBA FLEXTOUCH) 100 UNIT/ML SOPN Inject 22 Units into the skin daily.     Marland Kitchen lisinopril (PRINIVIL,ZESTRIL) 10 MG tablet Take 1 tablet (10 mg total) by mouth daily. 30 tablet 1  . metFORMIN (GLUCOPHAGE) 1000 MG tablet Take 1,000 mg by mouth 2 (two) times daily with a meal.     No current facility-administered medications for this visit.     Allergies:   Clindamycin/lincomycin and Codeine    Social History:  The patient  reports that she has quit smoking. Her smoking use included cigarettes. She has a 15.00 pack-year smoking history. She has never used smokeless tobacco.  She reports that she does not drink alcohol or use drugs.   Family History:  The patient's family history includes Gout in her father; Thyroid disease in her sister; Uterine cancer in her mother.    ROS:  Please see the history of present illness.   Otherwise, review of systems are positive for none.   All other systems are reviewed and negative.    PHYSICAL EXAM: VS:  BP (!) 152/88   Pulse 80   Ht 5\' 7"  (1.702 m)   Wt 193 lb (87.5 kg)   BMI 30.23 kg/m  , BMI Body mass index is 30.23 kg/m. GENERAL:  Well appearing HEENT:  Pupils equal round and reactive, fundi not visualized, oral mucosa unremarkable NECK:  No jugular venous distention, waveform within normal limits, carotid upstroke brisk and symmetric,  no bruits, no thyromegaly LYMPHATICS:  No cervical, inguinal adenopathy LUNGS:  Clear to auscultation bilaterally BACK:  No CVA tenderness CHEST:  Unremarkable HEART:  PMI not displaced or sustained,S1 and S2 within normal limits, no S3, no S4, no clicks, no rubs, no murmurs ABD:  Flat, positive bowel sounds normal in frequency in pitch, no bruits, no rebound, no guarding, no midline pulsatile mass, no hepatomegaly, no splenomegaly EXT:  2 plus pulses throughout, no edema, no cyanosis no clubbing, bilateral BKA SKIN:  No rashes no nodules NEURO:  Cranial nerves II through XII grossly intact, motor grossly intact throughout PSYCH:  Cognitively intact, oriented to person place and time    EKG:  EKG is not ordered today. The ekg ordered today demonstrates 07/26/18 sinus rhythm, rate 89, axis within normal limits, intervals within normal limits, poor anterior R wave progression, possible old anteroseptal infarct.  I saw 09/22/18   Recent Labs: No results found for requested labs within last 8760 hours.    Lipid Panel No results found for: CHOL, TRIG, HDL, CHOLHDL, VLDL, LDLCALC, LDLDIRECT    Wt Readings from Last 3 Encounters:  10/22/18 193 lb (87.5 kg)  07/26/18 187 lb (84.8 kg)  11/12/16 184 lb (83.5 kg)      Other studies Reviewed: Additional studies/ records that were reviewed today include: Office records. Review of the above records demonstrates:  Please see elsewhere in the note.     ASSESSMENT AND PLAN:  PREOP:   The patient will have a stress test as below.  This will determine her preoperative risk although it is a low risk procedure.  DM:  9.2 .  She is having this actively followed.  No change in therapy.  DYSLIPIDEMIA: Of note she recently was started on a statin because of her diabetes.  Her LDL actually was 86 with an HDL of 67.  I will defer to Lucianne Lei, MD .  I do agree with the statin.  CHEST PAIN:   Given the history of diabetes I will proceed with stress  testing.  She would not be able walk on a treadmill.  Therefore, she will have a [employer].   Current medicines are reviewed at length with the patient today.  The patient does not have concerns regarding medicines.  The following changes have been made:  no change  Labs/ tests ordered today include:   Orders Placed This Encounter  Procedures  . MYOCARDIAL PERFUSION IMAGING     Disposition:   FU with me as needed based on the results of the above.     Signed, Minus Breeding, MD  10/22/2018 2:27 PM    [facility]

## 2018-10-22 ENCOUNTER — Ambulatory Visit (INDEPENDENT_AMBULATORY_CARE_PROVIDER_SITE_OTHER): Payer: Medicare Other | Admitting: Cardiology

## 2018-10-22 ENCOUNTER — Encounter: Payer: Self-pay | Admitting: Cardiology

## 2018-10-22 VITALS — BP 152/88 | HR 80 | Ht 67.0 in | Wt 193.0 lb

## 2018-10-22 DIAGNOSIS — Z794 Long term (current) use of insulin: Secondary | ICD-10-CM | POA: Diagnosis not present

## 2018-10-22 DIAGNOSIS — E118 Type 2 diabetes mellitus with unspecified complications: Secondary | ICD-10-CM

## 2018-10-22 DIAGNOSIS — R079 Chest pain, unspecified: Secondary | ICD-10-CM | POA: Insufficient documentation

## 2018-10-22 DIAGNOSIS — E785 Hyperlipidemia, unspecified: Secondary | ICD-10-CM

## 2018-10-22 DIAGNOSIS — Z01810 Encounter for preprocedural cardiovascular examination: Secondary | ICD-10-CM | POA: Insufficient documentation

## 2018-10-22 NOTE — Patient Instructions (Signed)
Medication Instructions:  Continue current medications  If you need a refill on your cardiac medications before your next appointment, please call your pharmacy.  Labwork: None Ordered   If you have labs (blood work) drawn today and your tests are completely normal, you will receive your results only by: Marland Kitchen MyChart Message (if you have MyChart) OR . A paper copy in the mail If you have any lab test that is abnormal or we need to change your treatment, we will call you to review the results.  Testing/Procedures: Your physician has requested that you have a lexiscan myoview. For further information please visit [email or web address]. Please follow instruction sheet, as given.  Follow-Up: . You will need a follow up appointment in As Needed.    At [facility], you and your health needs are our priority.  As part of our continuing mission to provide you with exceptional heart care, we have created designated Provider Care Teams.  These Care Teams include your primary Cardiologist (physician) and Advanced Practice Providers (APPs -  Physician Assistants and Nurse Practitioners) who all work together to provide you with the care you need, when you need it.   Thank you for choosing [facility] at [facility]!!

## 2018-10-27 ENCOUNTER — Telehealth (HOSPITAL_COMMUNITY): Payer: Self-pay | Admitting: *Deleted

## 2018-10-27 NOTE — Telephone Encounter (Signed)
Patient given detailed instructions per Myocardial Perfusion Study Information Sheet for the test on 10/29/18 at 0715. Patient notified to arrive 15 minutes early and that it is imperative to arrive on time for appointment to keep from having the test rescheduled.  If you need to cancel or reschedule your appointment, please call the office within 24 hours of your appointment. . Patient verbalized understanding.Diana Coleman, [name]

## 2018-10-28 ENCOUNTER — Ambulatory Visit: Payer: Medicare Other | Attending: Family Medicine | Admitting: Physical Therapy

## 2018-10-28 DIAGNOSIS — M6281 Muscle weakness (generalized): Secondary | ICD-10-CM | POA: Diagnosis not present

## 2018-10-28 DIAGNOSIS — R29898 Other symptoms and signs involving the musculoskeletal system: Secondary | ICD-10-CM | POA: Insufficient documentation

## 2018-10-28 DIAGNOSIS — R2681 Unsteadiness on feet: Secondary | ICD-10-CM | POA: Insufficient documentation

## 2018-10-28 DIAGNOSIS — R262 Difficulty in walking, not elsewhere classified: Secondary | ICD-10-CM | POA: Diagnosis not present

## 2018-10-28 NOTE — Therapy (Signed)
[facility] [address] [place], [place], [address] Phone: [phone]   Fax:  [phone]  Physical Therapy Evaluation  Patient Details  Name: Diana Coleman MRN: [id number] Date of Birth: [birth year] Referring Provider (PT): Lucianne Lei, MD   Encounter Date: 10/28/2018  PT End of Session - 10/28/18 0923    Visit Number  1    Number of Visits  1   w/c eval only   Authorization Type  [employer] Medicare/Medicaid    PT Start Time  [phone]    PT Stop Time  0915    PT Time Calculation (min)  25 min    Behavior During Therapy  [facility] for tasks assessed/performed       Past Medical History:  Diagnosis Date  . Cellulitis of foot, left 10/24/2014   hx/notes 10/24/2014  . Charcot foot due to diabetes mellitus ([place])   . Osteomyelitis of right foot ([place])    hx/notes 10/24/2014  . Type II diabetes mellitus ([place])    Since 1998    Past Surgical History:  Procedure Laterality Date  . ABOVE KNEE LEG AMPUTATION Right 2004  . AMPUTATION Left 10/29/2014   Procedure: AMPUTATION BELOW KNEE - LEFT;  Surgeon: Newt Minion, MD;  Location: [facility];  Service: Orthopedics;  Laterality: Left;  . FOOT SURGERY Left 1980's   "ulcer removed"  . I&D EXTREMITY Right 10/04/2016   Procedure: IRRIGATION AND DEBRIDEMENT MIDDLE FINGER AND AMPUTATION;  Surgeon: Iran Planas, MD;  Location: [facility];  Service: Orthopedics;  Laterality: Right;  . REDUCTION MAMMAPLASTY Bilateral 1987  . TOE AMPUTATION Left ~ 2011   "top of my 3rd toe"  . TUBAL LIGATION  1973    There were no vitals filed for this visit.   Subjective Assessment - 10/28/18 0919    Subjective  Pt requesting w/c eval for new electric scooter; current scooter is >69 years old and needs new batteries.    Patient Stated Goals  To obtain a new electric scooter    Currently in Pain?  No/denies         [facility] PT Assessment - 10/28/18 3875      Assessment   Medical Diagnosis  bilat BKA     Referring Provider (PT)  Lucianne Lei, MD    Onset Date/Surgical Date  09/22/18    Hand Dominance  Right    Prior Therapy  yes for bilat prosthetic training      Balance Screen   Has the patient fallen in the past 6 months  Yes    How many times?  2      Prior Function   Level of Independence  Independent with homemaking with ambulation;Independent with transfers;Requires assistive device for independence;Independent with household mobility with device;Independent with community mobility with device      Observation/Other Assessments   Focus on Therapeutic Outcomes (FOTO)   Not performed          Mobility/Seating Evaluation    PATIENT INFORMATION: Name: Diana Coleman DOB: 07/30/1951  Sex: Female Date seen: 10/28/18 Time: 8:45  Address:  [address], [place] [address] Physician: Lucianne Lei, MD This evaluation/justification form will serve as the LMN for the following suppliers: __________________________ Supplier: [employer] Contact Person: Luz Brazen, [employer] Phone:  [phone]   Seating Therapist: Misty Stanley, PT Phone:   [phone]   Phone: [phone]    Spouse/Parent/Caregiver name: Joaquim Lai - Granddaughter  Phone number: [phone]  Insurance/Payer: [employer] Medicare and Medicaid     Reason for Referral: To obtain a new electric scooter; current scooter is >69 years old and needs new batteries   Patient/Caregiver Goals: To obtain new electric scooter for longer community distances and unfamiliar places  Patient was seen for face-to-face evaluation for new scooter.  Also present was [employer], [employer] to discuss recommendations and wheelchair options.  Further paperwork was completed and sent to vendor.  Patient appears to not qualify for power mobility device at this time per objective findings.   MEDICAL HISTORY: Diagnosis: Primary Diagnosis: Bilat BKA Onset: 2004, 2015 Diagnosis: Peripheral Vascular Disease   '[]' Progressive  Disease Relevant past and future surgeries: multiple amputations including: L BKA, R AKA, R middle finger amputation, upcoming eye surgery   Height: '5\' 7"'  Weight: 193 lb Explain recent changes or trends in weight: None   History including Falls: Type 2 DM, osteomyelitis, Charcot foot, abnormal EKG.  Uses a rolling desk chair in her house for chores and when she bent down to the floor and the chair slid out from under her, fall at church-RLE and prosthesis gave out under her    HOME ENVIRONMENT: '[]' House  '[]' Condo/town home  '[x]' Apartment  '[]' Assisted Living    '[x]' Lives Alone '[]'  Lives with Others                                                                                          Hours with caregiver: ?????  '[x]' Home is accessible to patient           Stairs      '[]' Yes '[x]'  No     Ramp '[x]' Yes '[]' No Comments:  Pt wears bilat prosthetic legs >8 hours a day, ambulates in her home and community with cane.  Uses rolling chair for household chores that require prolonged standing.  Used scooter in apartment following her amputation surgeries for mobility until she received prosthesis.    COMMUNITY ADL: TRANSPORTATION: '[x]' Car    '[]' Lucianne Lei    '[]' Diplomatic Services operational officer    '[]' Adapted w/c Lift    '[]' Ambulance    '[]' Other:       '[]' Sits in wheelchair during transport  Employment/School: ????? Specific requirements pertaining to mobility ?????  Other: Drives independently without hand controls    FUNCTIONAL/SENSORY PROCESSING SKILLS:  Handedness:   '[x]' Right     '[]' Left    '[]' NA  Comments:  ?????  Functional Processing Skills for Wheeled Mobility '[]' Processing Skills are adequate for safe wheelchair operation  Areas of concern than may interfere with safe operation of wheelchair Description of problem   '[]'  Attention to environment      '[]' Judgment      '[]'  Hearing  '[]'  Vision or visual processing      '[]' Motor Planning  '[]'  Fluctuations in Behavior  ?????    VERBAL COMMUNICATION: '[x]' WFL receptive '[x]'  WFL expressive  '[]' Understandable  '[]' Difficult to understand  '[]' non-communicative '[]'  Uses an augmented communication device  CURRENT SEATING / MOBILITY: Current Mobility Base:  '[]' None '[]' Dependent '[]' Manual '[x]' Scooter '[]' Power  Type of Control: ?????  Manufacturer:  GogoSize:  ?????Age: 64 years  Current Condition of Mobility Base:  Current Wheelchair components:  ?????  Describe posture in present seating system:  ?????      SENSATION and SKIN ISSUES: Sensation '[]' Intact  '[x]' Impaired '[]' Absent  Level of sensation: PN in hands and LE Pressure Relief: Able to perform effective pressure relief :    '[x]' Yes  '[]'  No Method: ???? If not, Why?: ?????  Skin Issues/Skin Integrity Current Skin Issues  '[]' Yes '[x]' No '[]' Intact '[]'  Red area'[]'  Open Area  '[]' Scar Tissue '[]' At risk from prolonged sitting Where  ?????  History of Skin Issues  '[]' Yes '[x]' No Where  ????? When  ?????  Hx of skin flap surgeries  '[]' Yes '[x]' No Where  ????? When  ?????  Limited sitting tolerance '[]' Yes '[x]' No Hours spent sitting in wheelchair daily: ?????  Complaint of Pain:  Please describe: L hip pain when she wears different shoes.  Some intermittent chest pain - getting cardiology work up   Swelling/Edema: None   ADL STATUS (in reference to wheelchair use):  Indep Assist Unable Indep with Equip Not assessed Comments  Dressing X ????? ????? ????? ????? ?????  Eating X ????? ????? ????? ????? ?????  Toileting X ????? ????? ????? ????? ?????  Bathing X ????? ????? ????? ????? ?????  Grooming/Hygiene X ????? ????? ????? ????? ?????  Meal Prep ????? ????? ????? X ????? sits in a rolling desk chair for cooking and other house chores  IADLS X ????? ????? ????? ????? ?????  Bowel Management: '[x]' Continent  '[]' Incontinent  '[]' Accidents Comments:  ?????  Bladder Management: '[x]' Continent  '[]' Incontinent  '[]' Accidents Comments:  ?????     WHEELCHAIR SKILLS: Manual w/c Propulsion: '[]' UE or LE strength and endurance sufficient to participate in ADLs  using manual wheelchair Arm : '[]' left '[]' right   '[]' Both      Distance: ????? Foot:  '[]' left '[]' right   '[]' Both  Operate Scooter: '[x]'  Strength, hand grip, balance and transfer appropriate for use '[x]' Living environment is accessible for use of scooter  Operate Power w/c:  '[]'  Std. Joystick   '[]'  Alternative Controls Indep '[]'  Assist '[]'  Dependent/unable '[]'  N/A '[]'   '[]' Safe          '[]'  Functional      Distance: ?????  Bed confined without wheelchair '[]'  Yes '[x]'  No   STRENGTH/RANGE OF MOTION:  ????? Range of Motion Strength  Shoulder ????? ?????  Elbow ????? ?????  Wrist/Hand ????? ?????  Hip ????? ?????  Knee ????? ?????  Ankle ????? ?????     MOBILITY/BALANCE:  '[]'  Patient is totally dependent for mobility  ?????    Balance Transfers Ambulation  Sitting Balance: Standing Balance: '[x]'  Independent '[x]'  Independent/Modified Independent  '[x]'  WFL     '[x]'  WFL '[]'  Supervision '[]'  Supervision  '[]'  Uses UE for balance  '[]'  Supervision '[]'  Min Assist '[]'  Ambulates with Assist  ?????    '[]'  Min Assist '[]'  Min assist '[]'  Mod Assist '[]'  Ambulates with Device:      '[]'  RW  '[]'  StW  '[]'  Cane  '[]'  ?????  '[]'  Mod Assist '[]'  Mod assist '[]'  Max assist   '[]'  Max Assist '[]'  Max assist '[]'  Dependent '[]'  Indep. Short Distance Only  '[]'  Unable '[]'  Unable '[]'  Lift / Sling Required Distance (in feet)  ?????   '[]'  Sliding board '[]'  Unable to Ambulate (see explanation below)  Cardio Status:  '[]' Intact  '[]'  Impaired   '[]'  NA     Is having a stress test tomorrow due to abnormal EKG  Respiratory Status:  '[]' Intact   '[]' Impaired   '[]' NA     ?????  Orthotics/Prosthetics: bilat prosthetics for ambulation   Comments (Address manual vs power w/c vs scooter): ?????         Anterior / Posterior Obliquity Rotation-Pelvis ?????  PELVIS    '[]'  '[]'  '[]'   Neutral Posterior Anterior  '[]'  '[]'  '[]'   WFL Rt elev Lt elev  '[]'  '[]'  '[]'   WFL Right Left                      Anterior    Anterior     '[]'  Fixed '[]'  Other '[]'  Partly Flexible '[]'  Flexible   '[]'  Fixed '[]'  Other '[]'   Partly Flexible  '[]'  Flexible  '[]'  Fixed '[]'  Other '[]'  Partly Flexible  '[]'  Flexible   TRUNK  '[]'  '[]'  '[]'   WFL ? Thoracic ? Lumbar  Kyphosis Lordosis  '[]'  '[]'  '[]'   WFL Convex Convex  Right Left '[]' c-curve '[]' s-curve '[]' multiple  '[]'  Neutral '[]'  Left-anterior '[]'  Right-anterior     '[]'  Fixed '[]'  Flexible '[]'  Partly Flexible '[]'  Other  '[]'  Fixed '[]'  Flexible '[]'  Partly Flexible '[]'  Other  '[]'  Fixed             '[]'  Flexible '[]'  Partly Flexible '[]'  Other    Position Windswept  ?????  HIPS          '[]'            '[]'               '[]'    Neutral       Abduct        ADduct         '[]'           '[]'            '[]'   Neutral Right           Left      '[]'  Fixed '[]'  Subluxed '[]'  Partly Flexible '[]'  Dislocated '[]'  Flexible  '[]'  Fixed '[]'  Other '[]'  Partly Flexible  '[]'  Flexible                 Foot Positioning Knee Positioning  ?????    '[]'  WFL  '[]' Lt '[]' Rt '[]'  WFL  '[]' Lt '[]' Rt    KNEES ROM concerns: ROM concerns:    & Dorsi-Flexed '[]' Lt '[]' Rt ?????    FEET Plantar Flexed '[]' Lt '[]' Rt      Inversion                 '[]' Lt '[]' Rt      Eversion                 '[]' Lt '[]' Rt     HEAD '[]'  Functional '[]'  Good Head Control  ?????  & '[]'  Flexed         '[]'  Extended '[]'  Adequate Head Control    NECK '[]'  Rotated  Lt  '[]'  Lat Flexed Lt '[]'  Rotated  Rt '[]'  Lat Flexed Rt '[]'  Limited Head Control     '[]'  Cervical Hyperextension '[]'  Absent  Head Control     SHOULDERS ELBOWS WRIST& HAND ?????      Left     Right    Left     Right    Left     Right   U/E '[]' Functional           '[]' Functional ????? ????? '[]' Fisting             '[]' Fisting      '[]' elev   '[]' dep      '[]' elev   '[]' dep       '[]'   pro -'[]' retract     '[]' pro  '[]' retract '[]' subluxed             '[]' subluxed           Goals for Wheelchair Mobility  '[]'  Independence with mobility in the home with motor related ADLs (MRADLs)  '[]'  Independence with MRADLs in the community '[]'  Provide dependent mobility  '[]'  Provide recline     '[]' Provide tilt   Goals for Seating system '[]'  Optimize pressure distribution '[]'  Provide support  needed to facilitate function or safety '[]'  Provide corrective forces to assist with maintaining or improving posture '[]'  Accommodate client's posture:   current seated postures and positions are not flexible or will not tolerate corrective forces '[]'  Client to be independent with relieving pressure in the wheelchair '[]' Enhance physiological function such as breathing, swallowing, digestion  Simulation ideas/Equipment trials:????? State why other equipment was unsuccessful:?????   MOBILITY BASE RECOMMENDATIONS and JUSTIFICATION: MOBILITY COMPONENT JUSTIFICATION  Manufacturer: ?????Model: ?????   Size: Width ?????Seat Depth ????? '[]' provide transport from point A to B      '[]' promote Indep mobility  '[]' is not a safe, functional ambulator '[]' walker or cane inadequate '[]' non-standard width/depth necessary to accommodate anatomical measurement '[]'  ?????  '[]' Manual Mobility Base '[]' non-functional ambulator    '[]' Scooter/POV  '[]' can safely operate  '[]' can safely transfer   '[]' has adequate trunk stability  '[]' cannot functionally propel manual w/c  '[]' Power Mobility Base  '[]' non-ambulatory  '[]' cannot functionally propel manual wheelchair  '[]'  cannot functionally and safely operate scooter/POV '[]' can safely operate and willing to  '[]' Stroller Base '[]' infant/child  '[]' unable to propel manual wheelchair '[]' allows for growth '[]' non-functional ambulator '[]' non-functional UE '[]' Indep mobility is not a goal at this time  '[]' Tilt  '[]' Forward '[]' Backward '[]' Powered tilt  '[]' Manual tilt  '[]' change position against gravitational force on head and shoulders  '[]' change position for pressure relief/cannot weight shift '[]' transfers  '[]' management of tone '[]' rest periods '[]' control edema '[]' facilitate postural control  '[]'  ?????  '[]' Recline  '[]' Power recline on power base '[]' Manual recline on manual base  '[]' accommodate femur to back angle  '[]' bring to full recline for ADL care  '[]' change position for pressure relief/cannot weight shift '[]' rest  periods '[]' repositioning for transfers or clothing/diaper /catheter changes '[]' head positioning  '[]' Lighter weight required '[]' self- propulsion  '[]' lifting '[]'  ?????  '[]' Heavy Duty required '[]' user weight greater than 250# '[]' extreme tone/ over active movement '[]' broken frame on previous chair '[]'  ?????  '[]'  Back  '[]'  Angle Adjustable '[]'  Custom molded ????? '[]' postural control '[]' control of tone/spasticity '[]' accommodation of range of motion '[]' UE functional control '[]' accommodation for seating system '[]'  ????? '[]' provide lateral trunk support '[]' accommodate deformity '[]' provide posterior trunk support '[]' provide lumbar/sacral support '[]' support trunk in midline '[]' Pressure relief over spinal processes  '[]'  Seat Cushion ????? '[]' impaired sensation  '[]' decubitus ulcers present '[]' history of pressure ulceration '[]' prevent pelvic extension '[]' low maintenance  '[]' stabilize pelvis  '[]' accommodate obliquity '[]' accommodate multiple deformity '[]' neutralize lower extremity position '[]' increase pressure distribution '[]'  ?????  '[]'  Pelvic/thigh support  '[]'  Lateral thigh guide '[]'  Distal medial pad  '[]'  Distal lateral pad '[]'  pelvis in neutral '[]' accommodate pelvis '[]'  position upper legs '[]'  alignment '[]'  accommodate ROM '[]'  decr adduction '[]' accommodate tone '[]' removable for transfers '[]' decr abduction  '[]'  Lateral trunk Supports '[]'  Lt     '[]'  Rt '[]' decrease lateral trunk leaning '[]' control tone '[]' contour for increased contact '[]' safety  '[]' accommodate asymmetry '[]'  ?????  '[]'  Mounting hardware  '[]' lateral trunk supports  '[]' back   '[]' seat '[]' headrest      '[]'  thigh support '[]' fixed   '[]' swing away '[]'   attach seat platform/cushion to w/c frame '[]' attach back cushion to w/c frame '[]' mount postural supports '[]' mount headrest  '[]' swing medial thigh support away '[]' swing lateral supports away for transfers  '[]'  ?????    Armrests  '[]' fixed '[]' adjustable height '[]' removable   '[]' swing away  '[]' flip back   '[]' reclining '[]' full length pads '[]' desk     '[]' pads tubular  '[]' provide support with elbow at 90   '[]' provide support for w/c tray '[]' change of height/angles for variable activities '[]' remove for transfers '[]' allow to come closer to table top '[]' remove for access to tables '[]'  ?????  Hangers/ Leg rests  '[]' 60 '[]' 70 '[]' 90 '[]' elevating '[]' heavy duty  '[]' articulating '[]' fixed '[]' lift off '[]' swing away     '[]' power '[]' provide LE support  '[]' accommodate to hamstring tightness '[]' elevate legs during recline   '[]' provide change in position for Legs '[]' Maintain placement of feet on footplate '[]' durability '[]' enable transfers '[]' decrease edema '[]' Accommodate lower leg length '[]'  ?????  Foot support Footplate    '[]' Lt  '[]'  Rt  '[]'  Center mount '[]' flip up     '[]' depth/angle adjustable '[]' Amputee adapter    '[]'  Lt     '[]'  Rt '[]' provide foot support '[]' accommodate to ankle ROM '[]' transfers '[]' Provide support for residual extremity '[]'  allow foot to go under wheelchair base '[]'  decrease tone  '[]'  ?????  '[]'  Ankle strap/heel loops '[]' support foot on foot support '[]' decrease extraneous movement '[]' provide input to heel  '[]' protect foot  Tires: '[]' pneumatic  '[]' flat free inserts  '[]' solid  '[]' decrease maintenance  '[]' prevent frequent flats '[]' increase shock absorbency '[]' decrease pain from road shock '[]' decrease spasms from road shock '[]'  ?????  '[]'  Headrest  '[]' provide posterior head support '[]' provide posterior neck support '[]' provide lateral head support '[]' provide anterior head support '[]' support during tilt and recline '[]' improve feeding   '[]' improve respiration '[]' placement of switches '[]' safety  '[]' accommodate ROM  '[]' accommodate tone '[]' improve visual orientation  '[]'  Anterior chest strap '[]'  Vest '[]'  Shoulder retractors  '[]' decrease forward movement of shoulder '[]' accommodation of TLSO '[]' decrease forward movement of trunk '[]' decrease shoulder elevation '[]' added abdominal support '[]' alignment '[]' assistance with shoulder control  '[]'  ?????  Pelvic Positioner '[]' Belt '[]' SubASIS bar '[]' Dual  Pull '[]' stabilize tone '[]' decrease falling out of chair/ **will not Decr potential for sliding due to pelvic tilting '[]' prevent excessive rotation '[]' pad for protection over boney prominence '[]' prominence comfort '[]' special pull angle to control rotation '[]'  ?????  Upper Extremity Support '[]' L   '[]'  R '[]' Arm trough    '[]' hand support '[]'  tray       '[]' full tray '[]' swivel mount '[]' decrease edema      '[]' decrease subluxation   '[]' control tone   '[]' placement for AAC/Computer/EADL '[]' decrease gravitational pull on shoulders '[]' provide midline positioning '[]' provide support to increase UE function '[]' provide hand support in natural position '[]' provide work surface   POWER WHEELCHAIR CONTROLS  '[]' Proportional  '[]' Non-Proportional Type ????? '[]' Left  '[]' Right '[]' provides access for controlling wheelchair   '[]' lacks motor control to operate proportional drive control '[]' unable to understand proportional controls  Actuator Control Module  '[]' Single  '[]' Multiple   '[]' Allow the client to operate the power seat function(s) through the joystick control   '[]' Safety Reset Switches '[]' Used to change modes and stop the wheelchair when driving in latch mode    '[]' Upgraded Electronics   '[]' programming for accurate control '[]' progressive Disease/changing condition '[]' non-proportional drive control needed '[]' Needed in order to operate power seat functions through joystick control   '[]' Display box '[]' Allows user to see in which mode and drive the wheelchair is set  '[]' necessary for alternate controls    '[]' Digital interface electronics '[]'   Allows w/c to operate when using alternative drive controls  '[]' ASL Head Array '[]' Allows client to operate wheelchair  through switches placed in tri-panel headrest  '[]' Sip and puff with tubing kit '[]' needed to operate sip and puff drive controls  '[]' Upgraded tracking electronics '[]' increase safety when driving '[]' correct tracking when on uneven surfaces  '[]' Mount for switches or joystick '[]' Attaches switches to w/c   '[]' Swing away for access or transfers '[]' midline for optimal placement '[]' provides for consistent access  '[]' Attendant controlled joystick plus mount '[]' safety '[]' long distance driving '[]' operation of seat functions '[]' compliance with transportation regulations '[]'  ?????    Rear wheel placement/Axle adjustability '[]' None '[]' semi adjustable '[]' fully adjustable  '[]' improved UE access to wheels '[]' improved stability '[]' changing angle in space for improvement of postural stability '[]' 1-arm drive access '[]' amputee pad placement '[]'  ?????  Wheel rims/ hand rims  '[]' metal  '[]' plastic coated '[]' oblique projections '[]' vertical projections '[]' Provide ability to propel manual wheelchair  '[]'  Increase self-propulsion with hand weakness/decreased grasp  Push handles '[]' extended  '[]' angle adjustable  '[]' standard '[]' caregiver access '[]' caregiver assist '[]' allows "hooking" to enable increased ability to perform ADLs or maintain balance  One armed device  '[]' Lt   '[]' Rt '[]' enable propulsion of manual wheelchair with one arm   '[]'  ?????   Brake/wheel lock extension '[]'  Lt   '[]'  Rt '[]' increase indep in applying wheel locks   '[]' Side guards '[]' prevent clothing getting caught in wheel or becoming soiled '[]'  prevent skin tears/abrasions  Battery: ????? '[]' to power wheelchair ?????  Other: ????? ????? ?????  The above equipment has a life- long use expectancy. Growth and changes in medical and/or functional conditions would be the exceptions. This is to certify that the therapist has no financial relationship with durable medical provider or manufacturer. The therapist will not receive remuneration of any kind for the equipment recommended in this evaluation.   Patient has mobility limitation that significantly impairs safe, timely participation in one or more mobility related ADL's.  (bathing, toileting, feeding, dressing, grooming, moving from room to room)                                                             '[]'  Yes '[]'  No Will mobility  device sufficiently improve ability to participate and/or be aided in participation of MRADL's?         '[]'  Yes '[x]'  No Can limitation be compensated for with use of a cane or walker?                                                                                '[x]'  Yes '[]'  No Does patient or caregiver demonstrate ability/potential ability & willingness to safely use the mobility device?   '[x]'  Yes '[]'  No Does patient's home environment support use of recommended mobility device?                                                    [  x] Yes '[]'  No Does patient have sufficient upper extremity function necessary to functionally propel a manual wheelchair?    '[]'  Yes '[x]'  No Does patient have sufficient strength and trunk stability to safely operate a POV (scooter)?                                  '[x]'  Yes '[]'  No Does patient need additional features/benefits provided by a power wheelchair for MRADL's in the home?       '[]'  Yes '[x]'  No Does the patient demonstrate the ability to safely use a power wheelchair?                                                              '[]'  Yes '[]'  No  Therapist Name Printed: ????? Date: ?????  Therapist's Signature:   Date:   Supplier's Name Printed: ????? Date: ?????  Supplier's Signature:   Date:  Patient/Caregiver Signature:   Date:     This is to certify that I have read this evaluation and do agree with the content within:      Physician's Name Printed: ?????  Physician's Signature:  Date:     This is to certify that I, the above signed therapist have the following affiliations: '[]'  This DME provider '[]'  Manufacturer of recommended equipment '[]'  Patient's long term care facility '[]'  None of the above            Objective measurements completed on examination: See above findings.              PT Education - 10/28/18 [phone]    Education Details  pt does not currently qualify for electric scooter due to level of independence; ATP discussed other  options with pt for obtaining new batteries or new scooter    Person(s) Educated  Patient    Methods  Explanation    Comprehension  Verbalized understanding                  Plan - 10/28/18 0924    Clinical Impression Statement  Pt presents for evaluation for new electric scooter.  Also present was [employer], [employer].  Following discussion regarding patient's home environment and functional level pt does not appear to qualify for electric scooter through Medicare at this time.  Pt is modified independent with ADL and functional mobility in her home and community with bilat prosthetics and cane.  She currently only uses electric scooter for longer distances in unfamiliar environments.  Pt educated on other options for obtaining new electric scooter or batteries.    PT Frequency  One time visit   w/c eval   Consulted and Agree with Plan of Care  Patient        Visit Diagnosis: Muscle weakness (generalized)  Other symptoms and signs involving the musculoskeletal system  Difficulty in walking, not elsewhere classified  Unsteadiness on feet     Problem List Patient Active Problem List   Diagnosis Date Noted  . Preop cardiovascular exam 10/22/2018  . Chest pain 10/22/2018  . Vitreous hemorrhage, right eye ([place]) 09/21/2018  . Proliferative diabetic retinopathy with macular edema ([place]) 09/21/2018  . Non-pressure chronic ulcer of left calf, limited to breakdown of skin (  [place]) 11/12/2016  . Status post bilateral below knee amputation ([place]) 11/12/2016  . Normocytic anemia 10/04/2016  . Osteomyelitis ([place]) 10/04/2016  . Finger infection 10/04/2016  . Osteomyelitis of finger ([place]) 10/04/2016  . Gastroparesis 11/02/2014  . Diabetes mellitus due to underlying condition with other specified complication ([place])   . Diabetic foot ([place])   . Nausea & vomiting   . Essential hypertension 10/25/2014  . GERD (gastroesophageal reflux disease) 10/25/2014  . Cellulitis 10/24/2014  .  Diabetes ([place]) 10/24/2014  . Insulin dependent diabetes mellitus ([place]) 10/24/2014   Rico Junker, PT, DPT 10/28/18    9:32 AM   [facility] [address] [place], [place], [address] Phone: [phone]   Fax:  [phone]  Name: CHINA DEITRICK MRN: [id number] Date of Birth: Feb 14, 1951

## 2018-10-29 ENCOUNTER — Ambulatory Visit (HOSPITAL_COMMUNITY)
Admission: RE | Admit: 2018-10-29 | Discharge: 2018-10-29 | Disposition: A | Discharge status: Home / Self Care | Payer: Medicare Other | Source: Ambulatory Visit | Attending: Cardiology | Admitting: Cardiology

## 2018-10-29 DIAGNOSIS — R079 Chest pain, unspecified: Secondary | ICD-10-CM | POA: Diagnosis not present

## 2018-10-29 LAB — MYOCARDIAL PERFUSION IMAGING
CHL CUP NUCLEAR SSS: 9
CSEPPHR: 90 {beats}/min
LVDIAVOL: 107 mL (ref 46–106)
LVSYSVOL: 51 mL
NUC STRESS TID: 1.09
Rest HR: 74 {beats}/min
SDS: 6
SRS: 3

## 2018-10-29 MED ORDER — TECHNETIUM TC 99M TETROFOSMIN IV KIT
10.1000 | PACK | Freq: Once | INTRAVENOUS | Status: AC | PRN
  Administered 2018-10-29: 10.1 via INTRAVENOUS
  Filled 2018-10-29: qty 11

## 2018-10-29 MED ORDER — REGADENOSON 0.4 MG/5ML IV SOLN
0.4000 mg | Freq: Once | INTRAVENOUS | Status: AC
  Administered 2018-10-29: 0.4 mg via INTRAVENOUS

## 2018-10-29 MED ORDER — TECHNETIUM TC 99M TETROFOSMIN IV KIT
31.2000 | PACK | Freq: Once | INTRAVENOUS | Status: AC | PRN
  Administered 2018-10-29: 31.2 via INTRAVENOUS
  Filled 2018-10-29: qty 32

## 2018-12-01 ENCOUNTER — Telehealth: Payer: Self-pay | Admitting: *Deleted

## 2018-12-01 NOTE — Telephone Encounter (Signed)
   [facility] Pre-operative Risk Assessment    Request for surgical clearance:  1. What type of surgery is being performed? Retina surgery   2. When is this surgery scheduled? TBD   3. What type of clearance is required (medical clearance vs. Pharmacy clearance to hold med vs. Both)? medical  4. Are there any medications that need to be held prior to surgery and how long?   5. Practice name and name of physician performing surgery? [facility] Dr. Zigmund Daniel   6. What is your office phone number [phone]    7.   What is your office fax number [phone] attn: Dr. Tempie Hoist  8.   Anesthesia type (None, local, MAC, general) ?  general   Diana Coleman Diana Coleman 12/01/2018, 8:18 AM  _________________________________________________________________   (provider comments below)

## 2018-12-08 NOTE — Telephone Encounter (Signed)
   Primary Cardiologist:James Hochrein, MD  Chart reviewed as part of pre-operative protocol coverage. Pre-op clearance already addressed by colleagues in earlier notes. Dr. Percival Spanish recently evaluated patient in clinic and performed stress test for this clearance. He states, "Negative perfusion study. No change in therapy. No further testing. Please send this note as preop. The patient has an acceptable risk for the planned procedure per ACC/AHA guidelines. "  Will route this bundled recommendation to requesting provider via Epic fax function. Please call with questions.  Charlie Pitter, PA-C 12/08/2018, 2:28 PM

## 2018-12-25 DIAGNOSIS — E118 Type 2 diabetes mellitus with unspecified complications: Secondary | ICD-10-CM | POA: Diagnosis not present

## 2018-12-25 DIAGNOSIS — Z899 Acquired absence of limb, unspecified: Secondary | ICD-10-CM | POA: Diagnosis not present

## 2018-12-25 DIAGNOSIS — E1140 Type 2 diabetes mellitus with diabetic neuropathy, unspecified: Secondary | ICD-10-CM | POA: Diagnosis not present

## 2018-12-25 DIAGNOSIS — I10 Essential (primary) hypertension: Secondary | ICD-10-CM | POA: Diagnosis not present

## 2018-12-25 DIAGNOSIS — E785 Hyperlipidemia, unspecified: Secondary | ICD-10-CM | POA: Diagnosis not present

## 2019-01-21 ENCOUNTER — Encounter (INDEPENDENT_AMBULATORY_CARE_PROVIDER_SITE_OTHER): Payer: Medicare Other | Admitting: Ophthalmology

## 2019-01-21 DIAGNOSIS — I10 Essential (primary) hypertension: Secondary | ICD-10-CM | POA: Diagnosis not present

## 2019-01-21 DIAGNOSIS — E113511 Type 2 diabetes mellitus with proliferative diabetic retinopathy with macular edema, right eye: Secondary | ICD-10-CM | POA: Diagnosis not present

## 2019-01-21 DIAGNOSIS — H4311 Vitreous hemorrhage, right eye: Secondary | ICD-10-CM

## 2019-01-21 DIAGNOSIS — E11311 Type 2 diabetes mellitus with unspecified diabetic retinopathy with macular edema: Secondary | ICD-10-CM

## 2019-01-21 DIAGNOSIS — H35033 Hypertensive retinopathy, bilateral: Secondary | ICD-10-CM

## 2019-01-21 DIAGNOSIS — E113592 Type 2 diabetes mellitus with proliferative diabetic retinopathy without macular edema, left eye: Secondary | ICD-10-CM | POA: Diagnosis not present

## 2019-01-25 NOTE — H&P (Signed)
Diana Coleman is an 68 y.o. female.   Chief Complaint: loss of vision right eye for 4 months HPI: Vision loss for 4 months right eye  Past Medical History:  Diagnosis Date  . Cellulitis of foot, left 10/24/2014   hx/notes 10/24/2014  . Charcot foot due to diabetes mellitus ([place])   . Osteomyelitis of right foot ([place])    hx/notes 10/24/2014  . Type II diabetes mellitus ([place])    Since 1998    Past Surgical History:  Procedure Laterality Date  . ABOVE KNEE LEG AMPUTATION Right 2004  . AMPUTATION Left 10/29/2014   Procedure: AMPUTATION BELOW KNEE - LEFT;  Surgeon: Newt Minion, MD;  Location: [facility];  Service: Orthopedics;  Laterality: Left;  . FOOT SURGERY Left 1980's   "ulcer removed"  . I&D EXTREMITY Right 10/04/2016   Procedure: IRRIGATION AND DEBRIDEMENT MIDDLE FINGER AND AMPUTATION;  Surgeon: Iran Planas, MD;  Location: [facility];  Service: Orthopedics;  Laterality: Right;  . REDUCTION MAMMAPLASTY Bilateral 1987  . TOE AMPUTATION Left ~ 2011   "top of my 3rd toe"  . TUBAL LIGATION  1973    Family History  Problem Relation Age of Onset  . Uterine cancer Mother   . Gout Father   . Thyroid disease Sister    Social History:  reports that she has quit smoking. Her smoking use included cigarettes. She has a 15.00 pack-year smoking history. She has never used smokeless tobacco. She reports that she does not drink alcohol or use drugs.  Allergies:  Allergies  Allergen Reactions  . Clindamycin/Lincomycin Itching  . Codeine Itching    No medications prior to admission.    Review of systems otherwise negative  There were no vitals taken for this visit.  Physical exam: Mental status: oriented x3. Eyes: See eye exam associated with this date of surgery in media tab.  Scanned in by scanning center Ears, Nose, Throat: within normal limits Neck: Within Normal limits General: within normal limits Chest: Within normal limits Breast: deferred Heart: Within normal limits Abdomen:  Within normal limits GU: deferred Extremities: within normal limits Skin: within normal limits  Assessment/Plan Proliferative diabetic retinopathy  Plan: To [facility] for Pars plana vitrectomy, membrane peel, laser, gas injection right eye  Hayden Pedro 01/25/2019, 1:00 PM

## 2019-01-28 ENCOUNTER — Encounter (HOSPITAL_COMMUNITY): Payer: Self-pay | Admitting: *Deleted

## 2019-01-28 ENCOUNTER — Other Ambulatory Visit: Payer: Self-pay

## 2019-01-28 NOTE — Progress Notes (Signed)
Pt denies SOB, chest pain, and being under the care of a cardiologist. Pt denies having an echo and cardiac cath. Pt denies recent labs. Pt made aware to stop taking  Aspirin (unless advised otherwise by surgeon), vitamins, fish oil and herbal medications. Do not take any NSAIDs ie: Ibuprofen, Advil, Naproxen (Aleve), Motrin, BC and Goody Powder. Pt stated that MD instructed her to take half dose of Tresiba insulin on DOS. Pt instructed to check blood glucose prior to taking insulin and if BG is < 70 do not take [place]. Pt made aware to check BG every 2 hours prior to arrival to hospital on DOS. Pt made aware to treat a BG < 70 with 4 glucose tabs, wait 15 minutes after intervention to recheck BG, if BG remains < 70, call Short Stay unit to speak with a nurse. Pt verbalized understanding of all pre-op instructions.

## 2019-02-01 ENCOUNTER — Encounter (HOSPITAL_COMMUNITY)
Admission: RE | Disposition: A | Discharge status: Home / Self Care | Payer: Self-pay | Source: Home / Self Care | Attending: Ophthalmology

## 2019-02-01 ENCOUNTER — Ambulatory Visit (HOSPITAL_COMMUNITY): Payer: Medicare Other | Admitting: Anesthesiology

## 2019-02-01 ENCOUNTER — Ambulatory Visit (HOSPITAL_COMMUNITY)
Admission: RE | Admit: 2019-02-01 | Discharge: 2019-02-02 | Disposition: A | Discharge status: Home / Self Care | Payer: Medicare Other | Attending: Ophthalmology | Admitting: Ophthalmology

## 2019-02-01 ENCOUNTER — Encounter (HOSPITAL_COMMUNITY): Payer: Self-pay | Admitting: *Deleted

## 2019-02-01 ENCOUNTER — Other Ambulatory Visit: Payer: Self-pay

## 2019-02-01 DIAGNOSIS — H4311 Vitreous hemorrhage, right eye: Secondary | ICD-10-CM | POA: Diagnosis not present

## 2019-02-01 DIAGNOSIS — I10 Essential (primary) hypertension: Secondary | ICD-10-CM | POA: Insufficient documentation

## 2019-02-01 DIAGNOSIS — E1139 Type 2 diabetes mellitus with other diabetic ophthalmic complication: Secondary | ICD-10-CM | POA: Diagnosis present

## 2019-02-01 DIAGNOSIS — Z89611 Acquired absence of right leg above knee: Secondary | ICD-10-CM | POA: Insufficient documentation

## 2019-02-01 DIAGNOSIS — Z89512 Acquired absence of left leg below knee: Secondary | ICD-10-CM | POA: Diagnosis not present

## 2019-02-01 DIAGNOSIS — Z794 Long term (current) use of insulin: Secondary | ICD-10-CM | POA: Diagnosis not present

## 2019-02-01 DIAGNOSIS — E113591 Type 2 diabetes mellitus with proliferative diabetic retinopathy without macular edema, right eye: Secondary | ICD-10-CM | POA: Insufficient documentation

## 2019-02-01 DIAGNOSIS — E11311 Type 2 diabetes mellitus with unspecified diabetic retinopathy with macular edema: Secondary | ICD-10-CM | POA: Diagnosis not present

## 2019-02-01 DIAGNOSIS — K219 Gastro-esophageal reflux disease without esophagitis: Secondary | ICD-10-CM | POA: Diagnosis not present

## 2019-02-01 DIAGNOSIS — Z23 Encounter for immunization: Secondary | ICD-10-CM | POA: Diagnosis not present

## 2019-02-01 DIAGNOSIS — Z87891 Personal history of nicotine dependence: Secondary | ICD-10-CM | POA: Insufficient documentation

## 2019-02-01 HISTORY — PX: PARS PLANA VITRECTOMY 27 GAUGE: SHX6738

## 2019-02-01 HISTORY — PX: MEMBRANE PEEL: SHX5967

## 2019-02-01 HISTORY — DX: Inflammatory liver disease, unspecified: K75.9

## 2019-02-01 HISTORY — DX: Type 2 diabetes mellitus with proliferative diabetic retinopathy without macular edema, unspecified eye: E11.3599

## 2019-02-01 LAB — COMPREHENSIVE METABOLIC PANEL
ALK PHOS: 90 U/L (ref 38–126)
ALT: 10 U/L (ref 0–44)
AST: 13 U/L — ABNORMAL LOW (ref 15–41)
Albumin: 3.1 g/dL — ABNORMAL LOW (ref 3.5–5.0)
Anion gap: 10 (ref 5–15)
BUN: 20 mg/dL (ref 8–23)
CALCIUM: 8.7 mg/dL — AB (ref 8.9–10.3)
CO2: 23 mmol/L (ref 22–32)
Chloride: 106 mmol/L (ref 98–111)
Creatinine, Ser: 0.71 mg/dL (ref 0.44–1.00)
GFR calc Af Amer: >60 mL/min (ref >60)
GFR calc non Af Amer: >60 mL/min (ref >60)
GLUCOSE: 203 mg/dL — AB (ref 70–99)
Potassium: 4.4 mmol/L (ref 3.5–5.1)
Sodium: 139 mmol/L (ref 135–145)
Total Bilirubin: 0.6 mg/dL (ref 0.3–1.2)
Total Protein: 7 g/dL (ref 6.5–8.1)

## 2019-02-01 LAB — GLUCOSE, CAPILLARY
GLUCOSE-CAPILLARY: 119 mg/dL — AB (ref 70–99)
GLUCOSE-CAPILLARY: 258 mg/dL — AB (ref 70–99)
GLUCOSE-CAPILLARY: 147 mg/dL — AB (ref 70–99)
Glucose-Capillary: 172 mg/dL — ABNORMAL HIGH (ref 70–99)
Glucose-Capillary: 160 mg/dL — ABNORMAL HIGH (ref 70–99)

## 2019-02-01 LAB — CBC
HCT: 39.5 % (ref 36.0–46.0)
Hemoglobin: 12 g/dL (ref 12.0–15.0)
MCH: 27 pg (ref 26.0–34.0)
MCHC: 30.4 g/dL (ref 30.0–36.0)
MCV: 89 fL (ref 80.0–100.0)
Platelets: 351 10*3/uL (ref 150–400)
RBC: 4.44 MIL/uL (ref 3.87–5.11)
RDW: 12.4 % (ref 11.5–15.5)
WBC: 7.6 10*3/uL (ref 4.0–10.5)
nRBC: 0 % (ref 0.0–0.2)

## 2019-02-01 SURGERY — PARS PLANA VITRECTOMY 27 GAUGE
Anesthesia: General | Site: Eye | Laterality: Right

## 2019-02-01 MED ORDER — PHENYLEPHRINE 40 MCG/ML (10ML) SYRINGE FOR IV PUSH (FOR BLOOD PRESSURE SUPPORT)
PREFILLED_SYRINGE | INTRAVENOUS | Status: DC | PRN
  Administered 2019-02-01 (×2): 40 ug via INTRAVENOUS
  Administered 2019-02-01: 80 ug via INTRAVENOUS
  Administered 2019-02-01 (×2): 40 ug via INTRAVENOUS

## 2019-02-01 MED ORDER — LATANOPROST 0.005 % OP SOLN
1.0000 [drp] | Freq: Every day | OPHTHALMIC | Status: DC
  Filled 2019-02-01: qty 2.5

## 2019-02-01 MED ORDER — INFLUENZA VAC SPLIT HIGH-DOSE 0.5 ML IM SUSY
0.5000 mL | PREFILLED_SYRINGE | INTRAMUSCULAR | Status: AC
  Administered 2019-02-02: 0.5 mL via INTRAMUSCULAR
  Filled 2019-02-01: qty 0.5

## 2019-02-01 MED ORDER — DEXAMETHASONE SODIUM PHOSPHATE 10 MG/ML IJ SOLN
INTRAMUSCULAR | Status: DC | PRN
  Administered 2019-02-01: 10 mg

## 2019-02-01 MED ORDER — AMLODIPINE BESYLATE 10 MG PO TABS
10.0000 mg | ORAL_TABLET | Freq: Every day | ORAL | Status: DC
  Administered 2019-02-01 – 2019-02-02 (×2): 10 mg via ORAL
  Filled 2019-02-01 (×2): qty 1

## 2019-02-01 MED ORDER — CEFTAZIDIME 1 G IJ SOLR
INTRAMUSCULAR | Status: AC
  Filled 2019-02-01: qty 1

## 2019-02-01 MED ORDER — STERILE WATER FOR INJECTION IJ SOLN
INTRAMUSCULAR | Status: DC | PRN
  Administered 2019-02-01: 20 mL

## 2019-02-01 MED ORDER — POLYMYXIN B SULFATE 500000 UNITS IJ SOLR
INTRAMUSCULAR | Status: AC
  Filled 2019-02-01: qty 500000

## 2019-02-01 MED ORDER — TRIAMCINOLONE ACETONIDE 40 MG/ML IJ SUSP
INTRAMUSCULAR | Status: AC
  Filled 2019-02-01: qty 5

## 2019-02-01 MED ORDER — SODIUM HYALURONATE 10 MG/ML IO SOLN
INTRAOCULAR | Status: DC | PRN
  Administered 2019-02-01: 0.85 mL via INTRAOCULAR

## 2019-02-01 MED ORDER — GATIFLOXACIN 0.5 % OP SOLN
OPHTHALMIC | Status: AC
  Administered 2019-02-01: 1 [drp] via OPHTHALMIC
  Filled 2019-02-01: qty 2.5

## 2019-02-01 MED ORDER — DULAGLUTIDE 1.5 MG/0.5ML ~~LOC~~ SOAJ: 1.5000 mg | SUBCUTANEOUS | Status: DC

## 2019-02-01 MED ORDER — LISINOPRIL 10 MG PO TABS
10.0000 mg | ORAL_TABLET | Freq: Every day | ORAL | Status: DC
  Administered 2019-02-01 – 2019-02-02 (×2): 10 mg via ORAL
  Filled 2019-02-01 (×2): qty 1

## 2019-02-01 MED ORDER — MORPHINE SULFATE (PF) 2 MG/ML IV SOLN: 1.0000 mg | INTRAVENOUS | Status: DC | PRN

## 2019-02-01 MED ORDER — INSULIN DEGLUDEC 100 UNIT/ML ~~LOC~~ SOPN: 32.0000 [IU] | PEN_INJECTOR | Freq: Every day | SUBCUTANEOUS | Status: DC

## 2019-02-01 MED ORDER — PHENYLEPHRINE HCL 2.5 % OP SOLN
1.0000 [drp] | OPHTHALMIC | Status: AC | PRN
  Administered 2019-02-01 (×3): 1 [drp] via OPHTHALMIC

## 2019-02-01 MED ORDER — BUPIVACAINE HCL (PF) 0.75 % IJ SOLN
INTRAMUSCULAR | Status: AC
  Filled 2019-02-01: qty 10

## 2019-02-01 MED ORDER — EPINEPHRINE PF 1 MG/ML IJ SOLN
INTRAOCULAR | Status: DC | PRN
  Administered 2019-02-01: 500.3 mL

## 2019-02-01 MED ORDER — FENTANYL CITRATE (PF) 100 MCG/2ML IJ SOLN: 25.0000 ug | INTRAMUSCULAR | Status: DC | PRN

## 2019-02-01 MED ORDER — LIDOCAINE 2% (20 MG/ML) 5 ML SYRINGE
INTRAMUSCULAR | Status: DC | PRN
  Administered 2019-02-01: 60 mg via INTRAVENOUS

## 2019-02-01 MED ORDER — CYCLOPENTOLATE HCL 1 % OP SOLN: 1.0000 [drp] | OPHTHALMIC | Status: DC | PRN

## 2019-02-01 MED ORDER — METFORMIN HCL 500 MG PO TABS
1000.0000 mg | ORAL_TABLET | Freq: Two times a day (BID) | ORAL | Status: DC
  Administered 2019-02-01 – 2019-02-02 (×2): 1000 mg via ORAL
  Filled 2019-02-01 (×2): qty 2

## 2019-02-01 MED ORDER — CALCIUM CARBONATE 1250 (500 CA) MG PO TABS
500.0000 mg | ORAL_TABLET | Freq: Every day | ORAL | Status: DC
  Administered 2019-02-02: 500 mg via ORAL
  Filled 2019-02-01: qty 1

## 2019-02-01 MED ORDER — PHENYLEPHRINE HCL 2.5 % OP SOLN
OPHTHALMIC | Status: AC
  Administered 2019-02-01: 1 [drp] via OPHTHALMIC
  Filled 2019-02-01: qty 2

## 2019-02-01 MED ORDER — INSULIN ASPART 100 UNIT/ML ~~LOC~~ SOLN: 0.0000 [IU] | SUBCUTANEOUS | Status: DC

## 2019-02-01 MED ORDER — BSS IO SOLN
INTRAOCULAR | Status: AC
  Filled 2019-02-01: qty 15

## 2019-02-01 MED ORDER — DORZOLAMIDE HCL 2 % OP SOLN
1.0000 [drp] | Freq: Three times a day (TID) | OPHTHALMIC | Status: DC
  Filled 2019-02-01: qty 10

## 2019-02-01 MED ORDER — ACETAMINOPHEN 160 MG/5ML PO SOLN: 325.0000 mg | ORAL | Status: DC | PRN

## 2019-02-01 MED ORDER — SODIUM HYALURONATE 10 MG/ML IO SOLN
INTRAOCULAR | Status: AC
  Filled 2019-02-01: qty 0.85

## 2019-02-01 MED ORDER — DORZOLAMIDE HCL-TIMOLOL MAL 2-0.5 % OP SOLN
1.0000 [drp] | Freq: Two times a day (BID) | OPHTHALMIC | Status: DC
  Administered 2019-02-01: 1 [drp] via OPHTHALMIC
  Filled 2019-02-01: qty 10

## 2019-02-01 MED ORDER — ROCURONIUM BROMIDE 50 MG/5ML IV SOSY
PREFILLED_SYRINGE | INTRAVENOUS | Status: DC | PRN
  Administered 2019-02-01: 50 mg via INTRAVENOUS

## 2019-02-01 MED ORDER — CEFAZOLIN SODIUM-DEXTROSE 2-4 GM/100ML-% IV SOLN
2.0000 g | INTRAVENOUS | Status: AC
  Administered 2019-02-01: 2 g via INTRAVENOUS

## 2019-02-01 MED ORDER — ATORVASTATIN CALCIUM 10 MG PO TABS
10.0000 mg | ORAL_TABLET | Freq: Every day | ORAL | Status: DC
  Administered 2019-02-01 – 2019-02-02 (×2): 10 mg via ORAL
  Filled 2019-02-01 (×2): qty 1

## 2019-02-01 MED ORDER — MEPERIDINE HCL 50 MG/ML IJ SOLN: 6.2500 mg | INTRAMUSCULAR | Status: DC | PRN

## 2019-02-01 MED ORDER — STERILE WATER FOR INJECTION IJ SOLN
INTRAMUSCULAR | Status: AC
  Filled 2019-02-01: qty 20

## 2019-02-01 MED ORDER — TROPICAMIDE 1 % OP SOLN
1.0000 [drp] | OPHTHALMIC | Status: AC | PRN
  Administered 2019-02-01 (×3): 1 [drp] via OPHTHALMIC

## 2019-02-01 MED ORDER — ATROPINE SULFATE 1 % OP SOLN
OPHTHALMIC | Status: AC
  Filled 2019-02-01: qty 5

## 2019-02-01 MED ORDER — DEXAMETHASONE SODIUM PHOSPHATE 10 MG/ML IJ SOLN
INTRAMUSCULAR | Status: AC
  Filled 2019-02-01: qty 1

## 2019-02-01 MED ORDER — CYCLOPENTOLATE HCL 1 % OP SOLN
OPHTHALMIC | Status: AC
  Administered 2019-02-01: 1 [drp] via OPHTHALMIC
  Filled 2019-02-01: qty 2

## 2019-02-01 MED ORDER — BSS PLUS IO SOLN
INTRAOCULAR | Status: AC
  Filled 2019-02-01: qty 500

## 2019-02-01 MED ORDER — EPINEPHRINE PF 1 MG/ML IJ SOLN
INTRAMUSCULAR | Status: AC
  Filled 2019-02-01: qty 1

## 2019-02-01 MED ORDER — CEFAZOLIN SODIUM-DEXTROSE 2-4 GM/100ML-% IV SOLN
INTRAVENOUS | Status: AC
  Filled 2019-02-01: qty 100

## 2019-02-01 MED ORDER — OXYCODONE HCL 5 MG/5ML PO SOLN: 5.0000 mg | Freq: Once | ORAL | Status: DC | PRN

## 2019-02-01 MED ORDER — GATIFLOXACIN 0.5 % OP SOLN: 1.0000 [drp] | OPHTHALMIC | Status: DC | PRN

## 2019-02-01 MED ORDER — TROPICAMIDE 1 % OP SOLN: 1.0000 [drp] | OPHTHALMIC | Status: DC | PRN

## 2019-02-01 MED ORDER — BEVACIZUMAB CHEMO INJECTION 1.25MG/0.05ML SYRINGE FOR KALEIDOSCOPE
1.2500 mg | Freq: Once | INTRAVITREAL | Status: DC
  Filled 2019-02-01 (×3): qty 0.1

## 2019-02-01 MED ORDER — TEMAZEPAM 15 MG PO CAPS: 15.0000 mg | ORAL_CAPSULE | Freq: Every evening | ORAL | Status: DC | PRN

## 2019-02-01 MED ORDER — SODIUM CHLORIDE 0.45 % IV SOLN
INTRAVENOUS | Status: DC
  Administered 2019-02-01: 15:00:00 via INTRAVENOUS

## 2019-02-01 MED ORDER — LIDOCAINE 2% (20 MG/ML) 5 ML SYRINGE
INTRAMUSCULAR | Status: AC
  Filled 2019-02-01: qty 5

## 2019-02-01 MED ORDER — BRIMONIDINE TARTRATE 0.2 % OP SOLN
1.0000 [drp] | Freq: Two times a day (BID) | OPHTHALMIC | Status: DC
  Filled 2019-02-01: qty 5

## 2019-02-01 MED ORDER — TETRACAINE HCL 0.5 % OP SOLN
2.0000 [drp] | Freq: Once | OPHTHALMIC | Status: DC
  Filled 2019-02-01: qty 4

## 2019-02-01 MED ORDER — ACETAMINOPHEN 325 MG PO TABS: 325.0000 mg | ORAL_TABLET | ORAL | Status: DC | PRN

## 2019-02-01 MED ORDER — SUGAMMADEX SODIUM 200 MG/2ML IV SOLN
INTRAVENOUS | Status: DC | PRN
  Administered 2019-02-01: 200 mg via INTRAVENOUS

## 2019-02-01 MED ORDER — TROPICAMIDE 1 % OP SOLN
OPHTHALMIC | Status: AC
  Administered 2019-02-01: 1 [drp] via OPHTHALMIC
  Filled 2019-02-01: qty 15

## 2019-02-01 MED ORDER — HYDROCODONE-ACETAMINOPHEN 5-325 MG PO TABS: 1.0000 | ORAL_TABLET | ORAL | Status: DC | PRN

## 2019-02-01 MED ORDER — 0.9 % SODIUM CHLORIDE (POUR BTL) OPTIME
TOPICAL | Status: DC | PRN
  Administered 2019-02-01: 1000 mL

## 2019-02-01 MED ORDER — GATIFLOXACIN 0.5 % OP SOLN
1.0000 [drp] | OPHTHALMIC | Status: AC | PRN
  Administered 2019-02-01 (×3): 1 [drp] via OPHTHALMIC

## 2019-02-01 MED ORDER — ONDANSETRON HCL 4 MG/2ML IJ SOLN: 4.0000 mg | Freq: Four times a day (QID) | INTRAMUSCULAR | Status: DC | PRN

## 2019-02-01 MED ORDER — PROPOFOL 10 MG/ML IV BOLUS
INTRAVENOUS | Status: DC | PRN
  Administered 2019-02-01: 150 mg via INTRAVENOUS

## 2019-02-01 MED ORDER — PREDNISOLONE ACETATE 1 % OP SUSP
1.0000 [drp] | Freq: Four times a day (QID) | OPHTHALMIC | Status: DC
  Filled 2019-02-01: qty 5

## 2019-02-01 MED ORDER — HYALURONIDASE HUMAN 150 UNIT/ML IJ SOLN
INTRAMUSCULAR | Status: AC
  Filled 2019-02-01: qty 1

## 2019-02-01 MED ORDER — MAGNESIUM HYDROXIDE 400 MG/5ML PO SUSP: 15.0000 mL | Freq: Four times a day (QID) | ORAL | Status: DC | PRN

## 2019-02-01 MED ORDER — MIDAZOLAM HCL 2 MG/2ML IJ SOLN
INTRAMUSCULAR | Status: AC
  Filled 2019-02-01: qty 2

## 2019-02-01 MED ORDER — LACTATED RINGERS IV SOLN
INTRAVENOUS | Status: DC | PRN
Start: 2019-02-01 — End: 2019-02-01

## 2019-02-01 MED ORDER — ONDANSETRON HCL 4 MG/2ML IJ SOLN: 4.0000 mg | Freq: Once | INTRAMUSCULAR | Status: DC | PRN

## 2019-02-01 MED ORDER — SODIUM CHLORIDE (PF) 0.9 % IJ SOLN
INTRAMUSCULAR | Status: AC
  Filled 2019-02-01: qty 10

## 2019-02-01 MED ORDER — PHENYLEPHRINE HCL 2.5 % OP SOLN: 1.0000 [drp] | OPHTHALMIC | Status: DC | PRN

## 2019-02-01 MED ORDER — ROCURONIUM BROMIDE 50 MG/5ML IV SOSY
PREFILLED_SYRINGE | INTRAVENOUS | Status: AC
  Filled 2019-02-01: qty 5

## 2019-02-01 MED ORDER — PROPOFOL 10 MG/ML IV BOLUS
INTRAVENOUS | Status: AC
  Filled 2019-02-01: qty 20

## 2019-02-01 MED ORDER — ATROPINE SULFATE 1 % OP SOLN
OPHTHALMIC | Status: DC | PRN
  Administered 2019-02-01: 1 [drp] via OPHTHALMIC

## 2019-02-01 MED ORDER — SUGAMMADEX SODIUM 500 MG/5ML IV SOLN
INTRAVENOUS | Status: AC
  Filled 2019-02-01: qty 5

## 2019-02-01 MED ORDER — INSULIN GLARGINE 100 UNIT/ML ~~LOC~~ SOLN
32.0000 [IU] | Freq: Every day | SUBCUTANEOUS | Status: DC
  Administered 2019-02-02: 32 [IU] via SUBCUTANEOUS
  Filled 2019-02-01: qty 0.32

## 2019-02-01 MED ORDER — BACITRACIN-POLYMYXIN B 500-10000 UNIT/GM OP OINT
TOPICAL_OINTMENT | OPHTHALMIC | Status: DC | PRN
  Administered 2019-02-01: 1 via OPHTHALMIC

## 2019-02-01 MED ORDER — FENTANYL CITRATE (PF) 250 MCG/5ML IJ SOLN
INTRAMUSCULAR | Status: AC
  Filled 2019-02-01: qty 5

## 2019-02-01 MED ORDER — MIDAZOLAM HCL 5 MG/5ML IJ SOLN
INTRAMUSCULAR | Status: DC | PRN
  Administered 2019-02-01: 1 mg via INTRAVENOUS

## 2019-02-01 MED ORDER — ACETAZOLAMIDE SODIUM 500 MG IJ SOLR
INTRAMUSCULAR | Status: AC
  Filled 2019-02-01: qty 500

## 2019-02-01 MED ORDER — ACETAZOLAMIDE SODIUM 500 MG IJ SOLR
500.0000 mg | Freq: Once | INTRAMUSCULAR | Status: AC
  Administered 2019-02-02: 500 mg via INTRAVENOUS
  Filled 2019-02-01: qty 500

## 2019-02-01 MED ORDER — ONDANSETRON HCL 4 MG/2ML IJ SOLN
INTRAMUSCULAR | Status: DC | PRN
  Administered 2019-02-01: 4 mg via INTRAVENOUS

## 2019-02-01 MED ORDER — BACITRACIN-POLYMYXIN B 500-10000 UNIT/GM OP OINT
1.0000 "application " | TOPICAL_OINTMENT | Freq: Three times a day (TID) | OPHTHALMIC | Status: DC
  Filled 2019-02-01: qty 3.5

## 2019-02-01 MED ORDER — CYCLOPENTOLATE HCL 1 % OP SOLN
1.0000 [drp] | OPHTHALMIC | Status: AC | PRN
  Administered 2019-02-01 (×3): 1 [drp] via OPHTHALMIC

## 2019-02-01 MED ORDER — OXYCODONE HCL 5 MG PO TABS: 5.0000 mg | ORAL_TABLET | Freq: Once | ORAL | Status: DC | PRN

## 2019-02-01 MED ORDER — GATIFLOXACIN 0.5 % OP SOLN
1.0000 [drp] | Freq: Four times a day (QID) | OPHTHALMIC | Status: DC
  Filled 2019-02-01: qty 2.5

## 2019-02-01 MED ORDER — LIDOCAINE HCL 2 % IJ SOLN
INTRAMUSCULAR | Status: AC
  Filled 2019-02-01: qty 20

## 2019-02-01 MED ORDER — FENTANYL CITRATE (PF) 100 MCG/2ML IJ SOLN
INTRAMUSCULAR | Status: DC | PRN
  Administered 2019-02-01: 50 ug via INTRAVENOUS

## 2019-02-01 MED ORDER — SODIUM CHLORIDE 0.9 % IV SOLN
INTRAVENOUS | Status: DC
  Administered 2019-02-01 (×2): via INTRAVENOUS

## 2019-02-01 MED ORDER — INSULIN ASPART 100 UNIT/ML ~~LOC~~ SOLN
0.0000 [IU] | Freq: Three times a day (TID) | SUBCUTANEOUS | Status: DC
  Administered 2019-02-01: 2 [IU] via SUBCUTANEOUS
  Administered 2019-02-01 – 2019-02-02 (×2): 8 [IU] via SUBCUTANEOUS

## 2019-02-01 MED ORDER — BUPIVACAINE HCL (PF) 0.75 % IJ SOLN
INTRAMUSCULAR | Status: DC | PRN
  Administered 2019-02-01: 10 mL

## 2019-02-01 SURGICAL SUPPLY — 77 items
APPLICATOR DR MATTHEWS STRL (MISCELLANEOUS) IMPLANT
BLADE EYE CATARACT 19 1.4 BEAV (BLADE) IMPLANT
BLADE MVR KNIFE 19G (BLADE) IMPLANT
BLADE MVR KNIFE 20G (BLADE) IMPLANT
CABLE BIPOLOR RESECTION CORD (MISCELLANEOUS) IMPLANT
CANNULA ANTERIOR CHAMBER 27GA (MISCELLANEOUS) IMPLANT
CANNULA DUAL BORE 23G (CANNULA) IMPLANT
CANNULA TROCAR 25G 6 VLV (OPHTHALMIC) IMPLANT
CANNULA TROCAR 25GA VLV (OPHTHALMIC) IMPLANT
CANNULA VLV SOFT TIP 27G (OPHTHALMIC) ×1 IMPLANT
CANNULA VLV SOFT TIP 27GA (OPHTHALMIC) ×2 IMPLANT
COTTONBALL LRG STERILE PKG (GAUZE/BANDAGES/DRESSINGS) ×6 IMPLANT
COVER MAYO STAND STRL (DRAPES) IMPLANT
COVER WAND RF STERILE (DRAPES) ×2 IMPLANT
DRAPE INCISE 51X51 W/FILM STRL (DRAPES) IMPLANT
DRAPE OPHTHALMIC 77X100 STRL (CUSTOM PROCEDURE TRAY) ×2 IMPLANT
FILTER BLUE MILLIPORE (MISCELLANEOUS) IMPLANT
FILTER STRAW FLUID ASPIR (MISCELLANEOUS) IMPLANT
FORCEPS ECKARDT ILM 25G SERR (OPHTHALMIC RELATED) IMPLANT
FORCEPS GRIESHABER ILM 27G (INSTRUMENTS) ×2 IMPLANT
GLOVE SS BIOGEL STRL SZ 6.5 (GLOVE) ×1 IMPLANT
GLOVE SS BIOGEL STRL SZ 7 (GLOVE) ×1 IMPLANT
GLOVE SUPERSENSE BIOGEL SZ 6.5 (GLOVE) ×1
GLOVE SUPERSENSE BIOGEL SZ 7 (GLOVE) ×1
GLOVE SURG 8.5 LATEX PF (GLOVE) ×2 IMPLANT
GOWN STRL REUS W/ TWL LRG LVL3 (GOWN DISPOSABLE) ×3 IMPLANT
GOWN STRL REUS W/TWL LRG LVL3 (GOWN DISPOSABLE) ×3
HANDLE PNEUMATIC FOR CONSTEL (OPHTHALMIC) IMPLANT
KIT BASIN OR (CUSTOM PROCEDURE TRAY) ×2 IMPLANT
KNIFE CRESCENT 2.5 55 ANG (BLADE) IMPLANT
MICROPICK 25G (MISCELLANEOUS)
NDL 18GX1X1/2 (RX/OR ONLY) (NEEDLE) ×1 IMPLANT
NDL 25GX 5/8IN NON SAFETY (NEEDLE) IMPLANT
NDL FILTER BLUNT 18X1 1/2 (NEEDLE) ×1 IMPLANT
NDL HYPO 30X.5 LL (NEEDLE) IMPLANT
NDL PRECISIONGLIDE 27X1.5 (NEEDLE) ×1 IMPLANT
NEEDLE 18GX1X1/2 (RX/OR ONLY) (NEEDLE) ×2 IMPLANT
NEEDLE 25GX 5/8IN NON SAFETY (NEEDLE) IMPLANT
NEEDLE FILTER BLUNT 18X 1/2SAF (NEEDLE) ×1
NEEDLE FILTER BLUNT 18X1 1/2 (NEEDLE) ×1 IMPLANT
NEEDLE HYPO 30X.5 LL (NEEDLE) IMPLANT
NEEDLE PRECISIONGLIDE 27X1.5 (NEEDLE) ×2 IMPLANT
NS IRRIG 1000ML POUR BTL (IV SOLUTION) ×2 IMPLANT
PACK VITRECTOMY CUSTOM (CUSTOM PROCEDURE TRAY) ×2 IMPLANT
PAD ARMBOARD 7.5X6 YLW CONV (MISCELLANEOUS) ×4 IMPLANT
PAK VITRECTOMY PIK  27GA (OPHTHALMIC) ×1
PAK VITRECTOMY PIK 27GA (OPHTHALMIC) ×1 IMPLANT
PENCIL BIPOLAR 25GA STR DISP (OPHTHALMIC RELATED) IMPLANT
PIC ILLUMINATED 25G (OPHTHALMIC) ×2
PICK MICROPICK 25G (MISCELLANEOUS) IMPLANT
PIK ILLUMINATED 25G (OPHTHALMIC) ×1 IMPLANT
PROBE DIATHERMY DSP 27GA (MISCELLANEOUS) ×2 IMPLANT
PROBE LASER ILLUM FLEX 27GA (OPHTHALMIC) ×2 IMPLANT
PROBE LASER ILLUM FLEX CVD 25G (OPHTHALMIC) IMPLANT
REPL STRA BRUSH NDL (NEEDLE) IMPLANT
REPL STRA BRUSH NEEDLE (NEEDLE) IMPLANT
RESERVOIR BACK FLUSH (MISCELLANEOUS) IMPLANT
ROLLS DENTAL (MISCELLANEOUS) ×4 IMPLANT
SCISSORS TIP ADVANCED DSP 25GA (INSTRUMENTS) IMPLANT
SCRAPER DIAMOND 25GA (OPHTHALMIC RELATED) IMPLANT
SCRAPER DIAMOND DUST MEMBRANE (MISCELLANEOUS) IMPLANT
SPONGE SURGIFOAM ABS GEL 12-7 (HEMOSTASIS) ×2 IMPLANT
STOPCOCK 4 WAY LG BORE MALE ST (IV SETS) IMPLANT
SUT CHROMIC 7 0 TG140 8 (SUTURE) IMPLANT
SUT ETHILON 10 0 CS140 6 (SUTURE) IMPLANT
SUT ETHILON 9 0 TG140 8 (SUTURE) IMPLANT
SUT POLY NON ABSORB 10-0 8 STR (SUTURE) IMPLANT
SUT SILK 4 0 RB 1 (SUTURE) IMPLANT
SYR 10ML LL (SYRINGE) IMPLANT
SYR 20CC LL (SYRINGE) ×2 IMPLANT
SYR 5ML LL (SYRINGE) IMPLANT
SYR BULB 3OZ (MISCELLANEOUS) ×2 IMPLANT
SYR TB 1ML LUER SLIP (SYRINGE) ×2 IMPLANT
TOWEL NATURAL 6PK STERILE (DISPOSABLE) ×2 IMPLANT
TUBING HIGH PRESS EXTEN 6IN (TUBING) IMPLANT
WATER STERILE IRR 1000ML POUR (IV SOLUTION) ×2 IMPLANT
WIPE INSTRUMENT VISIWIPE 73X73 (MISCELLANEOUS) IMPLANT

## 2019-02-01 NOTE — Transfer of Care (Signed)
Immediate Anesthesia Transfer of Care Note  Patient: Diana Coleman  Procedure(s) Performed: PARS PLANA VITRECTOMY 27 GAUGE, MEMBRANE PEEL, ENDOLASER, GAS INJECTION (Right Eye) MEBRANE PEEL (Right Eye)  Patient Location: PACU  Anesthesia Type:General  Level of Consciousness: awake, alert , oriented and sedated  Airway & Oxygen Therapy: Patient Spontanous Breathing and Patient connected to nasal cannula oxygen  Post-op Assessment: Report given to RN, Post -op Vital signs reviewed and stable and Patient moving all extremities  Post vital signs: Reviewed and stable  Last Vitals:  Vitals Value Taken Time  BP 132/74 02/01/2019  1:26 PM  Temp    Pulse 72 02/01/2019  1:28 PM  Resp 14 02/01/2019  1:28 PM  SpO2 95 % 02/01/2019  1:28 PM  Vitals shown include unvalidated device data.  Last Pain:  Vitals:   02/01/19 0925  TempSrc:   PainSc: 0-No pain      Patients Stated Pain Goal: 3 (81/15/72 6203)  Complications: No apparent anesthesia complications

## 2019-02-01 NOTE — H&P (Signed)
I examined the patient today and there is no change in the medical status 

## 2019-02-01 NOTE — Anesthesia Procedure Notes (Signed)
Procedure Name: Intubation Date/Time: 02/01/2019 12:16 PM Performed by: Eulas Post, Gaige Sebo W, CRNA Pre-anesthesia Checklist: Patient identified, Emergency Drugs available, Suction available and Patient being monitored Patient Re-evaluated:Patient Re-evaluated prior to induction Oxygen Delivery Method: Circle system utilized Preoxygenation: Pre-oxygenation with 100% oxygen Induction Type: IV induction Ventilation: Mask ventilation without difficulty Laryngoscope Size: Miller and 2 Grade View: Grade I Tube type: Oral Tube size: 7.0 mm Number of attempts: 1 Airway Equipment and Method: Stylet and Oral airway Placement Confirmation: ETT inserted through vocal cords under direct vision,  positive ETCO2 and breath sounds checked- equal and bilateral Secured at: 23 cm Tube secured with: Tape Dental Injury: Teeth and Oropharynx as per pre-operative assessment

## 2019-02-01 NOTE — Op Note (Signed)
NAME: Diana Coleman, WITMAN MEDICAL RECORD [id number] ACCOUNT [id number] DATE OF BIRTH:March 02, 1951 FACILITY: [facility] LOCATION: [place], [place]  OPERATIVE REPORT  DATE OF PROCEDURE:  02/01/2019  ADMISSION DIAGNOSIS:  Proliferative diabetic retinopathy with vitreous hemorrhage, right eye.  PROCEDURES:  Pars plana vitrectomy with endolaser and gas fluid exchange in the right eye.  SURGEON:  Tempie Hoist, MD  ASSISTANT:  Deatra Ina, SA.  ANESTHESIA:  General.  DESCRIPTION OF PROCEDURE:  Usual prep and drape, 27 gauge trocars were placed at 8, 10 and 2 o'clock.  Provisc placed on the corneal surface.  The BIOM viewing system was moved into place with the moderate wide viewing.  Pars plana vitrectomy was begun  just behind the crystalline lens.  The vitreous cavity was filled with dark red blood.  Vitrectomy was carried posteriorly and vitreous hemorrhage was carefully removed under low suction and rapid cutting.  A central core  vitrectomy was performed to begin with.  Then, the vitrectomy was carried into the mid periphery where surface proliferation was removed with the vitreous cutter.  The vitrectomy was carried into the far periphery with the super wide viewing system on  the BIOM device.  There was dark red blood at 6 o'clock and scleral depression was used to bring the blood and the wall of the eye into the view, so as not to damage the lens.  The vitreous hemorrhage was removed from the surface of the retina from 5-7  O'clock with the high speed vitreous cutter.  Once all blood was removed from the vitreous, the silicone tipped suction line was placed into the eye and surface hemorrhage was removed with the silicone tip suction.  The endolaser was positioned in the eye, 620 burns were placed around the  retinal periphery.  The power was 350 milliwatts, 1000 microns each and 0.1 seconds each.  A 40% gas fluid exchange was then carried out with the 27-gauge tip.  The  instruments were removed from the eye and the trocars were removed from the eye.  All wounds were tested and found to be secure.  Avastin 1.25 mg was injected into the vitreous cavity through the 12 o'clock pars plana.  Closing pressure was 10 with a [place] tonometer.  Polymyxin and gentamicin were rinsed around the globe for antibiotic coverage.  Decadron 10 mg was injected into the  lower subconjunctival space.  Atropine solution was applied.  Marcaine was injected around the globe for postoperative pain.  Polysporin ophthalmic ointment, a patch and a shield were placed.  The patient was awakened and taken to recovery in  satisfactory condition.  TN/NUANCE  D:02/01/2019 T:02/01/2019 JOB:[id number]

## 2019-02-01 NOTE — Anesthesia Postprocedure Evaluation (Signed)
Anesthesia Post Note  Patient: Diana Coleman  Procedure(s) Performed: PARS PLANA VITRECTOMY 27 GAUGE, MEMBRANE PEEL, ENDOLASER, GAS INJECTION (Right Eye) MEBRANE PEEL (Right Eye)     Patient location during evaluation: PACU Anesthesia Type: General Level of consciousness: awake and alert Pain management: pain level controlled Vital Signs Assessment: post-procedure vital signs reviewed and stable Respiratory status: spontaneous breathing, nonlabored ventilation, respiratory function stable and patient connected to nasal cannula oxygen Cardiovascular status: blood pressure returned to baseline and stable Postop Assessment: no apparent nausea or vomiting Anesthetic complications: no    Last Vitals:  Vitals:   02/01/19 1345 02/01/19 1400  BP: 139/81   Pulse: 71 67  Resp: 14 15  Temp:    SpO2: 99% 100%    Last Pain:  Vitals:   02/01/19 1345  TempSrc:   PainSc: Asleep                 Derek Laughter

## 2019-02-01 NOTE — Anesthesia Preprocedure Evaluation (Signed)
Anesthesia Evaluation  Patient identified by MRN, date of birth, ID band Patient awake    Reviewed: Allergy & Precautions, NPO status , Patient's Chart, lab work & pertinent test results  Airway Mallampati: I  TM Distance: >3 FB Neck ROM: Full    Dental  (+) Dental Advisory Given, Edentulous Upper, Partial Lower,    Pulmonary former smoker,    Pulmonary exam normal breath sounds clear to auscultation       Cardiovascular hypertension, Pt. on medications (-) angina(-) Past MI and (-) CHF Normal cardiovascular exam Rhythm:Regular Rate:Normal     Neuro/Psych negative neurological ROS  negative psych ROS   GI/Hepatic Neg liver ROS, GERD  Medicated and Controlled,  Endo/Other  diabetes, Type 2, Insulin Dependent, Oral Hypoglycemic Agents  Renal/GU negative Renal ROS     Musculoskeletal  (+) Arthritis , Osteoarthritis,    Abdominal   Peds  Hematology  (+) Blood dyscrasia, anemia ,   Anesthesia Other Findings Day of surgery medications reviewed with the patient.  Reproductive/Obstetrics                             Anesthesia Physical  Anesthesia Plan  ASA: III  Anesthesia Plan: General   Post-op Pain Management:    Induction: Intravenous, Rapid sequence and Cricoid pressure planned  PONV Risk Score and Plan: 2 and Treatment may vary due to age or medical condition  Airway Management Planned: Oral ETT  Additional Equipment:   Intra-op Plan:   Post-operative Plan: Extubation in OR  Informed Consent: I have reviewed the patients History and Physical, chart, labs and discussed the procedure including the risks, benefits and alternatives for the proposed anesthesia with the patient or authorized representative who has indicated his/her understanding and acceptance.     Dental advisory given  Plan Discussed with: CRNA  Anesthesia Plan Comments: (Risks/benefits of general anesthesia  discussed with patient including risk of damage to teeth, lips, gum, and tongue, nausea/vomiting, allergic reactions to medications, and the possibility of heart attack, stroke and death.  All patient questions answered.  Patient wishes to proceed.  )        Anesthesia Quick Evaluation

## 2019-02-01 NOTE — Brief Op Note (Signed)
02/01/2019  1:18 PM  PATIENT:  Diana Coleman  68 y.o. female  PRE-OPERATIVE DIAGNOSIS:  vitreous hemorrhage right eye  POST-OPERATIVE DIAGNOSIS:  vitreous hemorrhage right eye  PROCEDURE:  Procedure(s): PARS PLANA VITRECTOMY 27 GAUGE, MEMBRANE PEEL, ENDOLASER, GAS INJECTION (Right) MEBRANE PEEL (Right)  SURGEON:  Surgeon(s) and Role:    Hayden Pedro, MD - Primary  Brief Operative note   Preoperative diagnosis:  vitreous hemorrhage right eye Postoperative diagnosis  * No Diagnosis Codes entered *  Procedures: @ORPROCAL @  Surgeon:  Hayden Pedro, MD...  Assistant:  Deatra Ina SA    Anesthesia: General  Specimen: none  Estimated blood loss:  1cc  Complications: none  Patient sent to PACU in good condition  Composed by Hayden Pedro MD  Dictation number: [phone]

## 2019-02-02 ENCOUNTER — Encounter (HOSPITAL_COMMUNITY): Payer: Self-pay | Admitting: Ophthalmology

## 2019-02-02 DIAGNOSIS — Z89512 Acquired absence of left leg below knee: Secondary | ICD-10-CM | POA: Diagnosis not present

## 2019-02-02 DIAGNOSIS — K219 Gastro-esophageal reflux disease without esophagitis: Secondary | ICD-10-CM | POA: Diagnosis not present

## 2019-02-02 DIAGNOSIS — I10 Essential (primary) hypertension: Secondary | ICD-10-CM | POA: Diagnosis not present

## 2019-02-02 DIAGNOSIS — Z794 Long term (current) use of insulin: Secondary | ICD-10-CM | POA: Diagnosis not present

## 2019-02-02 DIAGNOSIS — Z87891 Personal history of nicotine dependence: Secondary | ICD-10-CM | POA: Diagnosis not present

## 2019-02-02 DIAGNOSIS — E113591 Type 2 diabetes mellitus with proliferative diabetic retinopathy without macular edema, right eye: Secondary | ICD-10-CM | POA: Diagnosis not present

## 2019-02-02 DIAGNOSIS — Z23 Encounter for immunization: Secondary | ICD-10-CM | POA: Diagnosis not present

## 2019-02-02 DIAGNOSIS — H4311 Vitreous hemorrhage, right eye: Secondary | ICD-10-CM | POA: Diagnosis not present

## 2019-02-02 DIAGNOSIS — Z89611 Acquired absence of right leg above knee: Secondary | ICD-10-CM | POA: Diagnosis not present

## 2019-02-02 LAB — GLUCOSE, CAPILLARY: Glucose-Capillary: 295 mg/dL — ABNORMAL HIGH (ref 70–99)

## 2019-02-02 MED ORDER — BACITRACIN-POLYMYXIN B 500-10000 UNIT/GM OP OINT: 1.0000 "application " | TOPICAL_OINTMENT | Freq: Three times a day (TID) | OPHTHALMIC | 0 refills | Status: DC

## 2019-02-02 MED ORDER — GATIFLOXACIN 0.5 % OP SOLN: 1.0000 [drp] | Freq: Four times a day (QID) | OPHTHALMIC | Status: DC

## 2019-02-02 MED ORDER — PREDNISOLONE ACETATE 1 % OP SUSP: 1.0000 [drp] | Freq: Four times a day (QID) | OPHTHALMIC | 0 refills | Status: DC

## 2019-02-02 NOTE — Plan of Care (Signed)
  Problem: Education: Goal: Knowledge of post-operative educational information will improve Outcome: Progressing   Problem: Safety: Goal: Ability to remain free from injury will improve Outcome: Progressing   Problem: Activity: Goal: Risk for activity intolerance will decrease Outcome: Progressing   

## 2019-02-02 NOTE — Progress Notes (Signed)
02/02/2019, 6:24 AM  Mental Status:  Awake, Alert, Oriented  Anterior segment: Cornea  Clear    Anterior Chamber Clear    Lens:   Cataract  Intra Ocular Pressure 14 mmHg with [place]open  Vitreous: Clear 50%gas bubble   Retina:  Attached Good laser reaction   Impression: Excellent result Retina attached   Final Diagnosis: Active Problems:   Vitreous hemorrhage of right eye due to diabetes mellitus ([name])   Plan: start post operative eye drops.  Discharge to home.  Give post operative instructions  Diana Coleman 02/02/2019, 6:24 AM

## 2019-02-02 NOTE — Discharge Summary (Signed)
Discharge summary not needed on [facility] patients per medical records. 

## 2019-02-02 NOTE — Progress Notes (Signed)
Patient discharged to home with instructions. 

## 2019-02-07 ENCOUNTER — Encounter (INDEPENDENT_AMBULATORY_CARE_PROVIDER_SITE_OTHER): Payer: Medicare Other | Admitting: Ophthalmology

## 2019-02-07 DIAGNOSIS — E113591 Type 2 diabetes mellitus with proliferative diabetic retinopathy without macular edema, right eye: Secondary | ICD-10-CM

## 2019-02-07 DIAGNOSIS — E11319 Type 2 diabetes mellitus with unspecified diabetic retinopathy without macular edema: Secondary | ICD-10-CM

## 2019-02-23 ENCOUNTER — Encounter (HOSPITAL_COMMUNITY): Payer: Self-pay | Admitting: Ophthalmology

## 2019-02-23 NOTE — Addendum Note (Signed)
Addendum  created 02/23/19 1212 by Janeece Riggers, MD   Intraprocedure Event edited, Intraprocedure Staff edited

## 2019-02-28 ENCOUNTER — Encounter (INDEPENDENT_AMBULATORY_CARE_PROVIDER_SITE_OTHER): Payer: Medicare Other | Admitting: Ophthalmology

## 2019-02-28 DIAGNOSIS — E11319 Type 2 diabetes mellitus with unspecified diabetic retinopathy without macular edema: Secondary | ICD-10-CM

## 2019-02-28 DIAGNOSIS — E113591 Type 2 diabetes mellitus with proliferative diabetic retinopathy without macular edema, right eye: Secondary | ICD-10-CM

## 2019-04-26 DIAGNOSIS — E1165 Type 2 diabetes mellitus with hyperglycemia: Secondary | ICD-10-CM | POA: Diagnosis not present

## 2019-04-26 DIAGNOSIS — Z79899 Other long term (current) drug therapy: Secondary | ICD-10-CM | POA: Diagnosis not present

## 2019-04-26 DIAGNOSIS — I10 Essential (primary) hypertension: Secondary | ICD-10-CM | POA: Diagnosis not present

## 2019-04-28 DIAGNOSIS — Z794 Long term (current) use of insulin: Secondary | ICD-10-CM | POA: Diagnosis not present

## 2019-04-28 DIAGNOSIS — I10 Essential (primary) hypertension: Secondary | ICD-10-CM | POA: Diagnosis not present

## 2019-04-28 DIAGNOSIS — E1165 Type 2 diabetes mellitus with hyperglycemia: Secondary | ICD-10-CM | POA: Diagnosis not present

## 2019-04-28 DIAGNOSIS — Z0000 Encounter for general adult medical examination without abnormal findings: Secondary | ICD-10-CM | POA: Diagnosis not present

## 2019-04-28 DIAGNOSIS — Z899 Acquired absence of limb, unspecified: Secondary | ICD-10-CM | POA: Diagnosis not present

## 2019-05-09 ENCOUNTER — Encounter (INDEPENDENT_AMBULATORY_CARE_PROVIDER_SITE_OTHER): Payer: Medicare Other | Admitting: Ophthalmology

## 2019-05-09 ENCOUNTER — Other Ambulatory Visit: Payer: Self-pay

## 2019-05-09 DIAGNOSIS — H43812 Vitreous degeneration, left eye: Secondary | ICD-10-CM

## 2019-05-09 DIAGNOSIS — E113593 Type 2 diabetes mellitus with proliferative diabetic retinopathy without macular edema, bilateral: Secondary | ICD-10-CM | POA: Diagnosis not present

## 2019-05-09 DIAGNOSIS — I10 Essential (primary) hypertension: Secondary | ICD-10-CM

## 2019-05-09 DIAGNOSIS — H35033 Hypertensive retinopathy, bilateral: Secondary | ICD-10-CM | POA: Diagnosis not present

## 2019-05-09 DIAGNOSIS — E11319 Type 2 diabetes mellitus with unspecified diabetic retinopathy without macular edema: Secondary | ICD-10-CM

## 2019-05-25 DIAGNOSIS — E1140 Type 2 diabetes mellitus with diabetic neuropathy, unspecified: Secondary | ICD-10-CM | POA: Diagnosis not present

## 2019-05-25 DIAGNOSIS — E1165 Type 2 diabetes mellitus with hyperglycemia: Secondary | ICD-10-CM | POA: Diagnosis not present

## 2019-05-25 DIAGNOSIS — Z1231 Encounter for screening mammogram for malignant neoplasm of breast: Secondary | ICD-10-CM | POA: Diagnosis not present

## 2019-05-25 DIAGNOSIS — Z0000 Encounter for general adult medical examination without abnormal findings: Secondary | ICD-10-CM | POA: Diagnosis not present

## 2019-05-25 DIAGNOSIS — I10 Essential (primary) hypertension: Secondary | ICD-10-CM | POA: Diagnosis not present

## 2019-06-09 DIAGNOSIS — E1169 Type 2 diabetes mellitus with other specified complication: Secondary | ICD-10-CM | POA: Diagnosis not present

## 2019-06-09 DIAGNOSIS — I10 Essential (primary) hypertension: Secondary | ICD-10-CM | POA: Diagnosis not present

## 2019-09-14 ENCOUNTER — Other Ambulatory Visit: Payer: Self-pay

## 2019-09-14 NOTE — Patient Outreach (Signed)
Diana Coleman) Care Management  09/14/2019  Diana Coleman 28-Aug-1951 [id number]  Medication Adherence call to Diana Coleman HIPPA Compliant Voice message left with a call back number. Diana Coleman is showing past due on Lisinopril 5 mg under [place].   [place] Management Direct Dial [phone]  Fax [phone] Diana [email or web address]

## 2019-09-29 ENCOUNTER — Other Ambulatory Visit: Payer: Self-pay

## 2019-09-29 NOTE — Patient Outreach (Signed)
[facility]) Care Management  09/29/2019  JANELIZ RUNKLE [birth year] [id number]   Medication Adherence call to Mrs. Shamikia Juhas HIPPA Compliant Voice message left with a call back number. Mrs. Schorsch is showing past due on Lisinopril 5 mg under [place].   [place] Management Direct Dial [phone]  Fax [phone] [email or web address]

## 2019-11-10 ENCOUNTER — Encounter (INDEPENDENT_AMBULATORY_CARE_PROVIDER_SITE_OTHER): Payer: Medicare Other | Admitting: Ophthalmology

## 2019-11-21 ENCOUNTER — Encounter (INDEPENDENT_AMBULATORY_CARE_PROVIDER_SITE_OTHER): Payer: Medicare Other | Admitting: Ophthalmology

## 2020-05-28 ENCOUNTER — Other Ambulatory Visit: Payer: Self-pay | Admitting: General Practice

## 2020-05-28 DIAGNOSIS — E2839 Other primary ovarian failure: Secondary | ICD-10-CM

## 2020-05-28 DIAGNOSIS — Z1231 Encounter for screening mammogram for malignant neoplasm of breast: Secondary | ICD-10-CM

## 2020-07-16 ENCOUNTER — Other Ambulatory Visit: Payer: Self-pay

## 2020-07-16 ENCOUNTER — Encounter: Payer: Self-pay | Admitting: Emergency Medicine

## 2020-07-16 ENCOUNTER — Ambulatory Visit
Admission: EM | Admit: 2020-07-16 | Discharge: 2020-07-16 | Disposition: A | Discharge status: Home / Self Care | Payer: Medicare Other | Attending: Family Medicine | Admitting: Family Medicine

## 2020-07-16 DIAGNOSIS — L03213 Periorbital cellulitis: Secondary | ICD-10-CM

## 2020-07-16 HISTORY — DX: Essential (primary) hypertension: I10

## 2020-07-16 MED ORDER — AMOXICILLIN-POT CLAVULANATE 875-125 MG PO TABS: 1.0000 | ORAL_TABLET | Freq: Two times a day (BID) | ORAL | 0 refills | Status: DC

## 2020-07-16 MED ORDER — AMOXICILLIN-POT CLAVULANATE 875-125 MG PO TABS: 1.0000 | ORAL_TABLET | Freq: Two times a day (BID) | ORAL | 0 refills | Status: AC

## 2020-07-16 NOTE — ED Triage Notes (Signed)
Pt presents to [facility] for assessment of left eye itchig x 1 week, with worsening redness, swelling and pain starting 2-3 days ago.

## 2020-07-16 NOTE — ED Provider Notes (Signed)
[name]    CSN: [id number] Arrival date & time: 07/16/20  1039      History   Chief Complaint Chief Complaint  Patient presents with   Eye Pain    HPI Diana Coleman is a 69 y.o. female.   Patient presents with left eyelid swelling, redness, and pain x 1 week. Symptoms progressively worsening. Mildly alleviated by warm compresses.  No significant pain, fevers, chills.  No trouble with vision. No drainage.   ROS per HPI      Past Medical History:  Diagnosis Date   Cellulitis of foot, left 10/24/2014   hx/notes 10/24/2014   Charcot foot due to diabetes mellitus ([place])    Hepatitis    Hep B    Hypertension    Osteomyelitis of right foot ([place])    hx/notes 10/24/2014   Proliferative diabetic retinopathy ([place])    right eye and vitreous hemorrhage   Type II diabetes mellitus ([place])    Since 1998    Patient Active Problem List   Diagnosis Date Noted   Vitreous hemorrhage of right eye due to diabetes mellitus ([place]) 02/01/2019   Preop cardiovascular exam 10/22/2018   Chest pain 10/22/2018   Vitreous hemorrhage, right eye ([place]) 09/21/2018   Proliferative diabetic retinopathy with macular edema ([place]) 09/21/2018   Non-pressure chronic ulcer of left calf, limited to breakdown of skin ([place]) 11/12/2016   Status post bilateral below knee amputation ([place]) 11/12/2016   Normocytic anemia 10/04/2016   Osteomyelitis ([place]) 10/04/2016   Finger infection 10/04/2016   Osteomyelitis of finger ([place]) 10/04/2016   Gastroparesis 11/02/2014   Diabetes mellitus due to underlying condition with other specified complication ([place])    Diabetic foot (HCC)    Nausea & vomiting    Essential hypertension 10/25/2014   GERD (gastroesophageal reflux disease) 10/25/2014   Cellulitis 10/24/2014   Diabetes ([place]) 10/24/2014   Insulin dependent diabetes mellitus ([address]) 10/24/2014    Past Surgical History:  Procedure Laterality Date   ABOVE KNEE LEG  AMPUTATION Right 2004   AMPUTATION Left 10/29/2014   Procedure: AMPUTATION BELOW KNEE - LEFT;  Surgeon: Newt Minion, MD;  Location: [place];  Service: Orthopedics;  Laterality: Left;   CATARACT EXTRACTION W/ INTRAOCULAR LENS  IMPLANT, BILATERAL     FOOT SURGERY Left 1980's   "ulcer removed"   I & D EXTREMITY Right 10/04/2016   Procedure: IRRIGATION AND DEBRIDEMENT MIDDLE FINGER AND AMPUTATION;  Surgeon: Iran Planas, MD;  Location: [place];  Service: Orthopedics;  Laterality: Right;   MEMBRANE PEEL Right 02/01/2019   Procedure: Carvel Getting;  Surgeon: Hayden Pedro, MD;  Location: [place];  Service: Ophthalmology;  Laterality: Right;   PARS PLANA VITRECTOMY 27 GAUGE Right 02/01/2019   PARS PLANA VITRECTOMY 27 GAUGE Right 02/01/2019   Procedure: PARS PLANA VITRECTOMY 27 GAUGE, MEMBRANE PEEL, ENDOLASER, GAS INJECTION;  Surgeon: Hayden Pedro, MD;  Location: [place];  Service: Ophthalmology;  Laterality: Right;   REDUCTION MAMMAPLASTY Bilateral 1987   TOE AMPUTATION Left ~ 2011   "top of my 3rd toe"   [place]    OB History   No obstetric history on file.      Home Medications    Prior to Admission medications   Medication Sig Start Date End Date Taking? Authorizing Provider  amLODipine (NORVASC) 10 MG tablet Take 10 mg by mouth daily.    [provider]  amoxicillin-clavulanate (AUGMENTIN) 875-125 MG tablet Take 1 tablet by mouth every  12 (twelve) hours for 10 days. 07/16/20 07/26/20  Loura Halt A, NP  atorvastatin (LIPITOR) 10 MG tablet Take 10 mg by mouth daily.  10/21/18   [provider]  Calcium Carbonate (CALCIUM 600 PO) Take 1 tablet by mouth daily.    [provider]  Dulaglutide (TRULICITY) 1.5 BO/1.7PZ SOPN Inject 1.5 mg into the skin every Thursday.     [provider]  Insulin Degludec (TRESIBA FLEXTOUCH) 100 UNIT/ML SOPN Inject 32 Units into the skin daily.     [provider]  lisinopril (PRINIVIL,ZESTRIL) 10  MG tablet Take 1 tablet (10 mg total) by mouth daily. 11/02/14   Kelvin Cellar, MD  metFORMIN (GLUCOPHAGE) 1000 MG tablet Take 1,000 mg by mouth 2 (two) times daily with a meal.    [provider]    Family History Family History  Problem Relation Age of Onset   Uterine cancer Mother    Gout Father    Thyroid disease Sister     Social History Social History   Tobacco Use   Smoking status: Former Smoker    Packs/day: 0.50    Years: 30.00    Pack years: 15.00    Types: Cigarettes   Smokeless tobacco: Never Used   Tobacco comment: "quit smoking ~ 2000"  Vaping Use   Vaping Use: Never used  Substance Use Topics   Alcohol use: No    Alcohol/week: 0.0 standard drinks   Drug use: No     Allergies   Clindamycin/lincomycin and Codeine   Review of Systems Review of Systems  Constitutional: Negative.   HENT: Negative.   Eyes: Positive for pain, redness and itching.     Physical Exam Triage Vital Signs ED Triage Vitals  Enc Vitals Group     BP 07/16/20 1122 (!) 189/100     Pulse Rate 07/16/20 1122 84     Resp 07/16/20 1122 18     Temp 07/16/20 1122 98.7 F (37.1 C)     Temp Source 07/16/20 1122 Oral     SpO2 07/16/20 1122 97 %     Weight --      Height --      Head Circumference --      Peak Flow --      Pain Score 07/16/20 1123 1     Pain Loc --      Pain Edu? --      Excl. in [place]? --    No data found.  Updated Vital Signs BP (!) 189/100 (BP Location: Left Arm)    Pulse 84    Temp 98.7 F (37.1 C) (Oral)    Resp 18    SpO2 97%   Visual Acuity Right Eye Distance:   Left Eye Distance:   Bilateral Distance:    Right Eye Near: R Near: 20/20 Left Eye Near:  L Near: 20/200 Bilateral Near:  20/25  Physical Exam Constitutional:      General: She is not in acute distress.    Appearance: Normal appearance.  HENT:     Head: Normocephalic.  Eyes:     General:        Left eye: Hordeolum present.    Extraocular Movements: Extraocular  movements intact.     Conjunctiva/sclera: Conjunctivae normal.     Pupils: Pupils are equal, round, and reactive to light. Pupils are equal.      Comments: Moderate periorbital swelling No scleral injection. No drainage Nontender to orbital area No pain with EOM  Neurological:  Mental Status: She is alert.  Psychiatric:        Mood and Affect: Mood normal.        Behavior: Behavior normal.      UC Treatments / Results  Labs (all labs ordered are listed, but only abnormal results are displayed) Labs Reviewed - No data to display  EKG   Radiology No results found.  Procedures Procedures (including critical care time)  Medications Ordered in UC Medications - No data to display  Initial Impression / Assessment and Plan / UC Course  I have reviewed the triage vital signs and the nursing notes.  Pertinent labs & imaging results that were available during my care of the patient were reviewed by me and considered in my medical decision making (see chart for details).     Periorbital cellulitis No concern for orbital cellulitis at this time.  Will treat with Augmentin.  Warm compresses. Return and ER precautions given Final Clinical Impressions(s) / UC Diagnoses   Final diagnoses:  Periorbital cellulitis of left eye     Discharge Instructions     Take the antibiotics as prescribed.  Make sure you finish the full course. Warm compresses to the eye Please follow-up with your ophthalmologist or go to the ER for worsening symptoms    ED Prescriptions    Medication Sig Dispense Auth. Provider   amoxicillin-clavulanate (AUGMENTIN) 875-125 MG tablet  (Status: Discontinued) Take 1 tablet by mouth every 12 (twelve) hours. 14 tablet Adrion Menz A, NP   amoxicillin-clavulanate (AUGMENTIN) 875-125 MG tablet Take 1 tablet by mouth every 12 (twelve) hours for 10 days. 20 tablet Loura Halt A, NP     PDMP not reviewed this encounter.   Orvan July, NP 07/16/20  1148

## 2020-07-16 NOTE — Discharge Instructions (Signed)
Take the antibiotics as prescribed.  Make sure you finish the full course. Warm compresses to the eye Please follow-up with your ophthalmologist or go to the ER for worsening symptoms

## 2020-09-20 ENCOUNTER — Telehealth: Payer: Self-pay

## 2020-09-20 NOTE — Telephone Encounter (Signed)
NOTES ON FILE FROM [facility] [phone], SENT REFRRAL TO [facility]

## 2020-10-18 DIAGNOSIS — E785 Hyperlipidemia, unspecified: Secondary | ICD-10-CM | POA: Insufficient documentation

## 2020-10-18 NOTE — Progress Notes (Signed)
Cardiology Office Note   Date:  10/19/2020   ID:  Diana Coleman, DOB 1951-06-01, MRN [id number]  PCP:  Andree Moro, DO  Cardiologist:   Minus Breeding, MD   Chief Complaint  Patient presents with  . PVCs      History of Present Illness: Diana Coleman is a 69 y.o. female who I saw previously for abnormal EKG.  There is a question of old anteroseptal infarct.  However, perfusion study suggested no evidence of ischemia or infarct.  She returns for follow-up.    She was referred back because of premature ectopic complexes.  She really has not noticed these.  She gets around with a cane with her bilateral amputations.  She takes care of now 12-year-old great grandson on some days.  She stays active despite her amputations.  She has had some chest discomfort that is a point tenderness in the right upper sternum that is sharp and fleeting and it comes on some days and goes away spontaneously.  She is not describing any substernal chest discomfort.  She has no neck or arm discomfort.  She has no shortness of breath, PND or orthopnea.  She has had no new symptoms since her stress perfusion study in 2019.  At that time she had a well-preserved ejection fraction.  Past Medical History:  Diagnosis Date  . Cellulitis of foot, left 10/24/2014   hx/notes 10/24/2014  . Charcot foot due to diabetes mellitus ([place])   . Hepatitis    Hep B   . Hypertension   . Osteomyelitis of right foot ([place])    hx/notes 10/24/2014  . Proliferative diabetic retinopathy ([place])    right eye and vitreous hemorrhage  . Type II diabetes mellitus ([place])    Since 1998    Past Surgical History:  Procedure Laterality Date  . ABOVE KNEE LEG AMPUTATION Right 2004  . AMPUTATION Left 10/29/2014   Procedure: AMPUTATION BELOW KNEE - LEFT;  Surgeon: Newt Minion, MD;  Location: [place];  Service: Orthopedics;  Laterality: Left;  . CATARACT EXTRACTION W/ INTRAOCULAR LENS  IMPLANT, BILATERAL    . FOOT SURGERY Left 1980's    "ulcer removed"  . I & D EXTREMITY Right 10/04/2016   Procedure: IRRIGATION AND DEBRIDEMENT MIDDLE FINGER AND AMPUTATION;  Surgeon: Iran Planas, MD;  Location: [place];  Service: Orthopedics;  Laterality: Right;  . MEMBRANE PEEL Right 02/01/2019   Procedure: Carvel Getting;  Surgeon: Hayden Pedro, MD;  Location: [place];  Service: Ophthalmology;  Laterality: Right;  . PARS PLANA VITRECTOMY 27 GAUGE Right 02/01/2019  . PARS PLANA VITRECTOMY 27 GAUGE Right 02/01/2019   Procedure: PARS PLANA VITRECTOMY 27 GAUGE, MEMBRANE PEEL, ENDOLASER, GAS INJECTION;  Surgeon: Hayden Pedro, MD;  Location: [place];  Service: Ophthalmology;  Laterality: Right;  . REDUCTION MAMMAPLASTY Bilateral 1987  . TOE AMPUTATION Left ~ 2011   "top of my 3rd toe"  . TUBAL LIGATION  1973     Current Outpatient Medications  Medication Sig Dispense Refill  . amLODipine (NORVASC) 10 MG tablet Take 10 mg by mouth daily.    Marland Kitchen atorvastatin (LIPITOR) 10 MG tablet Take 10 mg by mouth daily.     . Dulaglutide (TRULICITY) 1.5 JI/9.6VE SOPN Inject 1.5 mg into the skin every Thursday.     . Insulin Degludec (TRESIBA FLEXTOUCH) 100 UNIT/ML SOPN Inject 32 Units into the skin daily.     Marland Kitchen lisinopril (PRINIVIL,ZESTRIL) 10 MG tablet Take 1 tablet (10 mg  total) by mouth daily. 30 tablet 1  . metFORMIN (GLUCOPHAGE) 1000 MG tablet Take 1,000 mg by mouth 2 (two) times daily with a meal.     No current facility-administered medications for this visit.    Allergies:   Clindamycin/lincomycin and Codeine    ROS:  Please see the history of present illness.   Otherwise, review of systems are positive for none.   All other systems are reviewed and negative.    PHYSICAL EXAM: VS:  BP (!) 142/68   Pulse 75   Ht 5\' 7"  (1.702 m)   Wt 213 lb 9.6 oz (96.9 kg)   SpO2 98%   BMI 33.45 kg/m  , BMI Body mass index is 33.45 kg/m. GENERAL:  Well appearing NECK:  No jugular venous distention, waveform within normal limits, carotid upstroke brisk and  symmetric, no bruits, no thyromegaly LUNGS:  Clear to auscultation bilaterally CHEST:  Unremarkable HEART:  PMI not displaced or sustained,S1 and S2 within normal limits, no S3, no S4, no clicks, no rubs, no murmurs ABD:  Flat, positive bowel sounds normal in frequency in pitch, no bruits, no rebound, no guarding, no midline pulsatile mass, no hepatomegaly, no splenomegaly EXT:  2 plus pulses throughout, no edema, no cyanosis no clubbing, bilateral BKA   EKG:  EKG is  ordered today. The ekg ordered today demonstrates sinus rhythm, rate 75, axis within normal limits, intervals within normal limits, poor anterior R wave progression, possible old anteroseptal infarct.  Frequent premature ventricular contractions.  09/22/18   Recent Labs: No results found for requested labs within last 8760 hours.    Lipid Panel No results found for: CHOL, TRIG, HDL, CHOLHDL, VLDL, LDLCALC, LDLDIRECT    Wt Readings from Last 3 Encounters:  10/19/20 213 lb 9.6 oz (96.9 kg)  02/01/19 172 lb 9.9 oz (78.3 kg)  10/29/18 193 lb (87.5 kg)      Other studies Reviewed: Additional studies/ records that were reviewed today include: Labs Review of the above records demonstrates:  Please see elsewhere in the note.     ASSESSMENT AND PLAN:   DM:   A1c was in the sevens which is much better than it had been previously.  I do not have these results but she remembers tst yehem.  No change in therapy.    DYSLIPIDEMIA: LDL was 43 last year.  No change in therapy.   CHEST PAIN:   She had a negative stress test in 2019.   Her chest pain is very atypical and reproducible with palpation.  No further cardiac work-up.   PVCs: She is not having any symptoms related to these.  She had no ischemia on a stress test previously.  At this point no change in therapy or further work-up is suggested other than checking routine electrolytes such as TSH.  I do not have these labs I will defer to her primary provider to check a TSH,  basic metabolic profile and magnesium but those have not been done recently.  Current medicines are reviewed at length with the patient today.  The patient does not have concerns regarding medicines.  The following changes have been made:  None  Labs/ tests ordered today include: None  No orders of the defined types were placed in this encounter.    Disposition:   FU with me in two years so she did not answer these yet   Signed, Minus Breeding, MD  10/19/2020 8:24 AM    [place]

## 2020-10-19 ENCOUNTER — Ambulatory Visit (INDEPENDENT_AMBULATORY_CARE_PROVIDER_SITE_OTHER): Payer: Medicare Other | Admitting: Cardiology

## 2020-10-19 ENCOUNTER — Encounter: Payer: Self-pay | Admitting: Cardiology

## 2020-10-19 ENCOUNTER — Other Ambulatory Visit: Payer: Self-pay

## 2020-10-19 VITALS — BP 142/68 | HR 75 | Ht 67.0 in | Wt 213.6 lb

## 2020-10-19 DIAGNOSIS — E785 Hyperlipidemia, unspecified: Secondary | ICD-10-CM

## 2020-10-19 DIAGNOSIS — R079 Chest pain, unspecified: Secondary | ICD-10-CM

## 2020-10-19 DIAGNOSIS — E118 Type 2 diabetes mellitus with unspecified complications: Secondary | ICD-10-CM | POA: Diagnosis not present

## 2020-10-19 NOTE — Addendum Note (Signed)
Addended by: Leotis Pain, [place] on: 10/19/2020 08:36 AM   Modules accepted: Orders

## 2020-10-19 NOTE — Patient Instructions (Signed)
Medication Instructions:  No changes *If you need a refill on your cardiac medications before your next appointment, please call your pharmacy*   Lab Work: None ordered If you have labs (blood work) drawn today and your tests are completely normal, you will receive your results only by: Marland Kitchen MyChart Message (if you have MyChart) OR . A paper copy in the mail If you have any lab test that is abnormal or we need to change your treatment, we will call you to review the results.   Testing/Procedures: None ordered   Follow-Up: At [facility], you and your health needs are our priority.  As part of our continuing mission to provide you with exceptional heart care, we have created designated Provider Care Teams.  These Care Teams include your primary Cardiologist (physician) and Advanced Practice Providers (APPs -  Physician Assistants and Nurse Practitioners) who all work together to provide you with the care you need, when you need it.  We recommend signing up for the patient portal called "MyChart".  Sign up information is provided on this After Visit Summary.  MyChart is used to connect with patients for Virtual Visits (Telemedicine).  Patients are able to view lab/test results, encounter notes, upcoming appointments, etc.  Non-urgent messages can be sent to your provider as well.   To learn more about what you can do with MyChart, go to [email or web address].    Your next appointment:   2 year(s)  The format for your next appointment:   In Person  Provider:   Minus Breeding, MD   Other Instructions None

## 2021-08-22 ENCOUNTER — Ambulatory Visit
Admission: RE | Admit: 2021-08-22 | Discharge: 2021-08-22 | Disposition: A | Discharge status: Home / Self Care | Payer: Medicare Other | Source: Ambulatory Visit | Attending: Family Medicine | Admitting: Family Medicine

## 2021-08-22 ENCOUNTER — Other Ambulatory Visit: Payer: Self-pay

## 2021-08-22 ENCOUNTER — Other Ambulatory Visit: Payer: Self-pay | Admitting: Family Medicine

## 2021-08-22 DIAGNOSIS — M79602 Pain in left arm: Secondary | ICD-10-CM

## 2021-09-25 ENCOUNTER — Other Ambulatory Visit: Payer: Self-pay | Admitting: Family Medicine

## 2021-09-25 DIAGNOSIS — E2839 Other primary ovarian failure: Secondary | ICD-10-CM

## 2021-10-17 ENCOUNTER — Ambulatory Visit: Payer: Medicare Other | Attending: Family Medicine

## 2021-10-17 ENCOUNTER — Other Ambulatory Visit: Payer: Self-pay

## 2021-10-17 DIAGNOSIS — M6281 Muscle weakness (generalized): Secondary | ICD-10-CM | POA: Insufficient documentation

## 2021-10-17 DIAGNOSIS — R2689 Other abnormalities of gait and mobility: Secondary | ICD-10-CM | POA: Diagnosis not present

## 2021-10-17 DIAGNOSIS — R2681 Unsteadiness on feet: Secondary | ICD-10-CM | POA: Diagnosis present

## 2021-10-17 DIAGNOSIS — R293 Abnormal posture: Secondary | ICD-10-CM | POA: Insufficient documentation

## 2021-10-17 NOTE — Therapy (Signed)
[facility] [address] [place], [place], [address] Phone: [phone]   Fax:  [phone]  Physical Therapy Evaluation  Patient Details  Name: Diana Coleman MRN: [id number] Date of Birth: 1951/10/18 Referring Provider (PT): Keith Rake   Encounter Date: 10/17/2021   PT End of Session - 10/17/21 0930     Visit Number 1    Number of Visits 17    Date for PT Re-Evaluation 12/13/21    Authorization Type [employer] medicare so 10th visit progress note    PT Start Time 0842    PT Stop Time 0930    PT Time Calculation (min) 48 min    Equipment Utilized During Treatment Gait belt    Activity Tolerance Patient tolerated treatment well    Behavior During Therapy [facility] for tasks assessed/performed             Past Medical History:  Diagnosis Date   Cellulitis of foot, left 10/24/2014   hx/notes 10/24/2014   Charcot foot due to diabetes mellitus ([place])    Hepatitis    Hep B    Hypertension    Osteomyelitis of right foot ([place])    hx/notes 10/24/2014   Proliferative diabetic retinopathy ([place])    right eye and vitreous hemorrhage   Type II diabetes mellitus ([place])    Since 1998    Past Surgical History:  Procedure Laterality Date   ABOVE KNEE LEG AMPUTATION Right 2004   AMPUTATION Left 10/29/2014   Procedure: AMPUTATION BELOW KNEE - LEFT;  Surgeon: Newt Minion, MD;  Location: [facility];  Service: Orthopedics;  Laterality: Left;   CATARACT EXTRACTION W/ INTRAOCULAR LENS  IMPLANT, BILATERAL     FOOT SURGERY Left 1980's   "ulcer removed"   I & D EXTREMITY Right 10/04/2016   Procedure: IRRIGATION AND DEBRIDEMENT MIDDLE FINGER AND AMPUTATION;  Surgeon: Iran Planas, MD;  Location: [place];  Service: Orthopedics;  Laterality: Right;   MEMBRANE PEEL Right 02/01/2019   Procedure: [place] Getting;  Surgeon: Hayden Pedro, MD;  Location: [facility];  Service: Ophthalmology;  Laterality: Right;   PARS PLANA VITRECTOMY 27 GAUGE Right 02/01/2019    PARS PLANA VITRECTOMY 27 GAUGE Right 02/01/2019   Procedure: PARS PLANA VITRECTOMY 27 GAUGE, MEMBRANE PEEL, ENDOLASER, GAS INJECTION;  Surgeon: Hayden Pedro, MD;  Location: [facility];  Service: Ophthalmology;  Laterality: Right;   REDUCTION MAMMAPLASTY Bilateral 1987   TOE AMPUTATION Left ~ 2011   "top of my 3rd toe"   TUBAL LIGATION  1973    There were no vitals filed for this visit.    Subjective Assessment - 10/17/21 0845     Subjective Pt presents for therapy with referal for bilateral BKA. Right was in 2005 and left was 2017. Pt reports that she has history of neuropathy and feels like her legs are getting weaker. She reports that she also gets some discomfort in right hip area. Pt reports that she did see her prosthetist Mortimer Fries at [employer] conservator/restorer). She also feels less steady. Pt walks with SPC with quad tip most of time but also has a walker that she uses for longer distances or that she is unfamiliar with.    Pertinent History Type 2 DM, osteomyelitis, Charcot foot, abnormal EKG.    Patient Stated Goals Pt would like to strengthen her legs and be able to get up from chair easier.    Currently in Pain? No/denies  [facility] PT Assessment - 10/17/21 0850       Assessment   Medical Diagnosis bilateral BKA with weakness    Referring Provider (PT) Keith Rake    Onset Date/Surgical Date 09/10/21    Hand Dominance Right    Prior Therapy Pt has had outpatient PT in the past      Precautions   Precautions Fall      Balance Screen   Has the patient fallen in the past 6 months No    Has the patient had a decrease in activity level because of a fear of falling?  No    Is the patient reluctant to leave their home because of a fear of falling?  No      Home Environment   Living Environment Private residence    Living Arrangements Alone    Type of [employer] Access Level entry    [place] - single point;Electric  scooter;Grab bars - tub/shower;Walker - 4 wheels      Prior Function   Level of Independence Independent with basic ADLs;Independent with community mobility with device    Vocation Retired    Ambulance person, Teaching laboratory technician Impaired by gross assessment    Additional Comments Pt has decreased sensation in legs and fingers. Does feel light touch in fingers but not normal      ROM / Strength   AROM / PROM / Strength Strength;AROM      AROM   Overall AROM Comments Limited to about 0 degrees hip extension on right      Strength   Strength Assessment Site Shoulder;Hip;Knee;Ankle;Elbow    Right/Left Shoulder Right;Left    Right Shoulder Flexion 5/5    Left Shoulder Flexion 4/5    Right/Left Elbow Right;Left    Right Elbow Flexion 5/5    Right Elbow Extension 5/5    Left Elbow Flexion 5/5    Left Elbow Extension 5/5    Right/Left Hip Right;Left    Right Hip Flexion 4+/5    Right Hip Extension 3+/5    Right Hip ABduction 4/5   some pain   Left Hip Flexion 4+/5    Left Hip Extension 4/5    Left Hip ABduction 4+/5    Right/Left Knee Right;Left    Right Knee Flexion 4+/5    Right Knee Extension 4+/5    Left Knee Flexion 4+/5    Left Knee Extension 4+/5      Transfers   Transfers Sit to Stand;Stand to Sit    Sit to Stand 5: Supervision;With upper extremity assist    Five time sit to stand comments  12.47 sec with hands from chair    Stand to Sit 5: Supervision;With upper extremity assist      Ambulation/Gait   Ambulation/Gait Yes    Ambulation/Gait Assistance 5: Supervision    Ambulation/Gait Assistance Details Pt had some circumduction on left with wide BOS.    Ambulation Distance (Feet) 75 Feet    Assistive device Straight cane;Prosthesis   with quad tip   Gait Pattern Step-through pattern;Wide base of support    Ambulation Surface Level;Indoor    Gait velocity 10.39 sec=0.96 m/s      Standardized Balance Assessment   Standardized Balance  Assessment Timed Up and Go Test;Berg Balance Test      Berg Balance Test   Sit to Stand Able to stand  independently using hands    Standing Unsupported Able to stand safely 2 minutes    Sitting with Back Unsupported but Feet Supported on Floor or Stool Able to sit safely and securely 2 minutes    Stand to Sit Sits safely with minimal use of hands    Transfers Able to transfer with verbal cueing and /or supervision    Standing Unsupported with Eyes Closed Able to stand 10 seconds with supervision    Standing Unsupported with Feet Together Needs help to attain position but able to stand for 30 seconds with feet together    From Standing, Reach Forward with Outstretched Arm Can reach forward >12 cm safely (5")    From Standing Position, Pick up Object from Floor Able to pick up shoe safely and easily    From Standing Position, Turn to Look Behind Over each Shoulder Turn sideways only but maintains balance    Turn 360 Degrees Able to turn 360 degrees safely but slowly    Standing Unsupported, Alternately Place Feet on Step/Stool Needs assistance to keep from falling or unable to try    Standing Unsupported, One Foot in Front Needs help to step but can hold 15 seconds    Standing on One Leg Tries to lift leg/unable to hold 3 seconds but remains standing independently    Total Score 34      Timed Up and Go Test   TUG Normal TUG    Normal TUG (seconds) 12.26    TUG Comments with cane             Prosthetics Assessment - 10/17/21 0901       Prosthetics   Prosthetic Care Independent with Skin check;Proper wear schedule/adjustment    Prosthetic Care Comments  Pt is currently wearing 2 ply bilatera. PT did educate on removing prosthesis if feels she is sweating during the day to pat skin and liner dry.    Donning prosthesis  Independent    Doffing prosthesis  Independent    Current prosthetic wear tolerance (days/week)  daily    Current prosthetic wear tolerance (#hours/day)  all awake  hours    Residual limb condition  skin intact.    Prosthesis Description pin and dual suspension                       Objective measurements completed on examination: See above findings.                PT Education - 10/17/21 0947     Education Details PT plan of care    Person(s) Educated Patient    Methods Explanation    Comprehension Verbalized understanding              PT Short Term Goals - 10/17/21 0959       PT SHORT TERM GOAL #1   Title Pt will be independent with HEP for strengthening and balance to continue gains at home.    Baseline no current HEP    Time 4    Period Weeks    Status New    Target Date 11/14/21      PT SHORT TERM GOAL #2   Title Pt will decrease 5 x sit to stand from 12.47 sec to < 11 sec for improved balance and functional mobility.    Baseline 10/17/21 12.47 sec with hands from chair.    Time 4    Period Weeks    Status New  Target Date 11/14/21      PT SHORT TERM GOAL #3   Title Pt will ambulate >1000' on varied level surfaces with cane for improved community mobility.    Baseline 30' with cane supervision    Time 4    Period Weeks    Status New    Target Date 11/14/21               PT Long Term Goals - 10/17/21 1112       PT LONG TERM GOAL #1   Title Pt will increase Berg score from 34 to >40/56 for improved balance and functional mobility.    Baseline 10/17/21 34/56    Time 8    Period Weeks    Status New    Target Date 12/13/21      PT LONG TERM GOAL #2   Title Pt will increase gait speed to >1.06 m/s for improved community ambulator speed.    Baseline 10/17/21 0.61m/s    Time 8    Period Weeks    Status New    Target Date 12/13/21      PT LONG TERM GOAL #3   Title Pt will be able to perform sit to stand x 3 from chair without hands for improved functional strength.    Baseline 10/17/21 needs hands to rise    Time 8    Period Weeks    Status New    Target Date 12/13/21       PT LONG TERM GOAL #4   Title Pt will ambulate up/down 4 steps with 1 rail and cane, up/down ramp with cane and up/down curb with cane mod I for improved community access.    Baseline steps not assessed at eval    Time 8    Period Weeks    Status New    Target Date 12/13/21                    Plan - 10/17/21 0948     Clinical Impression Statement Pt is 70 y/o female with history of bilateral BKA form 2005 and 2017. Returns to therapy with reports of decreased strength and balance. Pt currently ambulating with SPC for short distances with speed of 0.24m/s indicating safe community ambulator but decreased from norms. Pt has very wide BOS. She uses walker for longer distances. Pt is high fall risk based on Berg score of 34/56 and TUG of 12.26 sec. She also required increased time with 5 x sit to stand of 12.47 sec but needed her hands to rise. Pt will benefit from skilled PT to address her strength, balance and functional mobility deficits.    Personal Factors and Comorbidities Comorbidity 3+    Comorbidities Type 2 DM, osteomyelitis, Charcot foot, abnormal EKG.    Examination-Activity Limitations Locomotion Level;Transfers;Stairs;Squat;Stand    Examination-Participation Restrictions Nurse, adult;Yard Work    Merchant navy officer Evolving/Moderate complexity    Clinical Decision Making Moderate    Rehab Potential Good    PT Frequency 2x / week   plus eval   PT Duration 8 weeks    PT Treatment/Interventions ADLs/Self Care Home Management;DME Instruction;Gait training;Stair training;Functional mobility training;Therapeutic activities;Therapeutic exercise;Balance training;Neuromuscular re-education;Manual techniques;Prosthetic Training;Passive range of motion;Patient/family education;Vestibular    PT Next Visit Plan Next visit begin functional strengthening HEP with sit to stand, perhaps step-ups. Assess stairs. Balance training. Gait training with cane  and without device.    Consulted and Agree with Plan of Care Patient  Patient will benefit from skilled therapeutic intervention in order to improve the following deficits and impairments:  Abnormal gait, Decreased mobility, Decreased strength, Decreased balance, Decreased knowledge of use of DME, Decreased range of motion, Impaired sensation, Prosthetic Dependency  Visit Diagnosis: Other abnormalities of gait and mobility  Muscle weakness (generalized)  Abnormal posture  Unsteadiness on feet     Problem List Patient Active Problem List   Diagnosis Date Noted   Dyslipidemia 10/18/2020   Vitreous hemorrhage of right eye due to diabetes mellitus ([place]) 02/01/2019   Preop cardiovascular exam 10/22/2018   Chest pain 10/22/2018   Vitreous hemorrhage, right eye ([place]) 09/21/2018   Proliferative diabetic retinopathy with macular edema ([place]) 09/21/2018   Non-pressure chronic ulcer of left calf, limited to breakdown of skin ([place]) 11/12/2016   Status post bilateral below knee amputation ([place]) 11/12/2016   Normocytic anemia 10/04/2016   Osteomyelitis ([place]) 10/04/2016   Finger infection 10/04/2016   Osteomyelitis of finger ([place]) 10/04/2016   Gastroparesis 11/02/2014   Diabetes mellitus due to underlying condition with other specified complication ([place])    Diabetic foot ([facility])    Nausea & vomiting    Essential hypertension 10/25/2014   GERD (gastroesophageal reflux disease) 10/25/2014   Cellulitis 10/24/2014   Diabetes ([place]) 10/24/2014   Insulin dependent diabetes mellitus ([place]) 10/24/2014    Electa Sniff, PT, DPT, NCS 10/17/2021, 11:16 AM  [facility] [address] [place], [place], [address] Phone: [phone]   Fax:  [phone]  Name: Diana Coleman MRN: [id number] Date of Birth: 12/17/1951

## 2021-10-24 ENCOUNTER — Encounter: Payer: Self-pay | Admitting: Physical Therapy

## 2021-10-24 ENCOUNTER — Ambulatory Visit: Payer: Medicare Other | Attending: Family Medicine | Admitting: Physical Therapy

## 2021-10-24 ENCOUNTER — Other Ambulatory Visit: Payer: Self-pay

## 2021-10-24 DIAGNOSIS — R2681 Unsteadiness on feet: Secondary | ICD-10-CM | POA: Insufficient documentation

## 2021-10-24 DIAGNOSIS — R293 Abnormal posture: Secondary | ICD-10-CM | POA: Diagnosis present

## 2021-10-24 DIAGNOSIS — R2689 Other abnormalities of gait and mobility: Secondary | ICD-10-CM | POA: Diagnosis present

## 2021-10-24 DIAGNOSIS — M6281 Muscle weakness (generalized): Secondary | ICD-10-CM | POA: Insufficient documentation

## 2021-10-24 NOTE — Patient Instructions (Signed)
Instructed/provided pt with the following HEP: Access Code: [email or web address] URL: [email or web address] Date: 10/24/2021 Prepared by: Willow Ora  Exercises Sit to Stand with Counter Support - 1 x daily - 5 x weekly - 1 sets - 10 reps Standing Hip Abduction with Counter Support - 1 x daily - 5 x weekly - 1 sets - 10 reps Standing Hip Extension with Counter Support - 1 x daily - 5 x weekly - 1 sets - 10 reps Standing March with Counter Support - 1 x daily - 5 x weekly - 1 sets - 10 reps Standing Balance in Corner with Eyes Closed - 1 x daily - 5 x weekly - 1 sets - 3 reps - 30 hold Standing in corner with eyes open - 1 x daily - 5 x weekly - 3 sets - 10-15 reps Seated Hamstring Stretch - 1 x daily - 5 x weekly - 1 sets - 3 reps - 30 hold Seated Piriformis Stretch with Trunk Bend - 1 x daily - 5 x weekly - 1 sets - 3 reps - 30 hold

## 2021-10-24 NOTE — Therapy (Signed)
[facility] [address] [place], [place], [address] Phone: [phone]   Fax:  [phone]  Physical Therapy Treatment  Patient Details  Name: Diana Coleman MRN: [id number] Date of Birth: [birth year] Referring Provider (PT): Keith Rake   Encounter Date: 10/24/2021   PT End of Session - 10/24/21 0826     Visit Number 2    Number of Visits 17    Date for PT Re-Evaluation 12/13/21    Authorization Type U[employer] medicare so 10th visit progress note    PT Start Time 0720    PT Stop Time 0803    PT Time Calculation (min) 43 min    Equipment Utilized During Treatment Gait belt    Activity Tolerance Patient tolerated treatment well    Behavior During Therapy [facility] for tasks assessed/performed             Past Medical History:  Diagnosis Date   Cellulitis of foot, left 10/24/2014   hx/notes 10/24/2014   Charcot foot due to diabetes mellitus ([place])    Hepatitis    Hep B    Hypertension    Osteomyelitis of right foot ([place])    hx/notes 10/24/2014   Proliferative diabetic retinopathy ([place])    right eye and vitreous hemorrhage   Type II diabetes mellitus ([place])    Since 1998    Past Surgical History:  Procedure Laterality Date   ABOVE KNEE LEG AMPUTATION Right 2004   AMPUTATION Left 10/29/2014   Procedure: AMPUTATION BELOW KNEE - LEFT;  Surgeon: Newt Minion, MD;  Location: [place] [facility];  Service: Orthopedics;  Laterality: Left;   CATARACT EXTRACTION W/ INTRAOCULAR LENS  IMPLANT, BILATERAL     FOOT SURGERY Left 1980's   "ulcer removed"   I & D EXTREMITY Right 10/04/2016   Procedure: IRRIGATION AND DEBRIDEMENT MIDDLE FINGER AND AMPUTATION;  Surgeon: Iran Planas, MD;  Location: [facility];  Service: Orthopedics;  Laterality: Right;   MEMBRANE PEEL Right 02/01/2019   Procedure: Carvel Getting;  Surgeon: Hayden Pedro, MD;  Location: [place] [facility];  Service: Ophthalmology;  Laterality: Right;   PARS PLANA VITRECTOMY 27 GAUGE Right 02/01/2019    PARS PLANA VITRECTOMY 27 GAUGE Right 02/01/2019   Procedure: PARS PLANA VITRECTOMY 27 GAUGE, MEMBRANE PEEL, ENDOLASER, GAS INJECTION;  Surgeon: Hayden Pedro, MD;  Location: [facility];  Service: Ophthalmology;  Laterality: Right;   REDUCTION MAMMAPLASTY Bilateral 1987   TOE AMPUTATION Left ~ 2011   "top of my 3rd toe"   TUBAL LIGATION  1973    There were no vitals filed for this visit.   Subjective Assessment - 10/24/21 0722     Subjective Pt has no falls, pain, or any complaints this session. Pt is ready for session.    Pertinent History Type 2 DM, osteomyelitis, Charcot foot, abnormal EKG.    Patient Stated Goals Pt would like to strengthen her legs and be able to get up from chair easier.    Currently in Pain? No/denies                      [facility] Adult PT Treatment/Exercise - 10/24/21 0834       Transfers   Transfers Sit to Stand;Stand to Sit    Sit to Stand 4: Min guard;4: Min assist;Without upper extremity assist    Sit to Stand Details Tactile cues for posture;Tactile cues for weight shifting;Tactile cues for placement;Verbal cues for technique    Sit to Stand  Details (indicate cue type and reason) Pt performed sit<>stands from mat table with no UE support, different stances, and a chair in front for safety. The pt demonstrated excessive flexion at hips to regain balance in standing, and decreased instability when standing with staggered stance with R foot back.    Stand to Sit 5: Supervision;4: Min guard;Without upper extremity assist      Ambulation/Gait   Ambulation/Gait Yes    Ambulation/Gait Assistance 5: Supervision;4: Min guard    Ambulation Distance (Feet) 100 Feet    Assistive device Straight cane;Prostheses   Pt abulated into the clinic with straight cane with quad tip, and with no AD during session.   Gait Pattern Step-through pattern;Lateral trunk lean to right;Lateral trunk lean to left;Wide base of support    Ambulation Surface Level;Indoor                  Balance Exercises - 10/24/21 0843       Balance Exercises: Standing   Standing Eyes Closed Narrow base of support (BOS);Solid surface;Other reps (comment);Limitations    Standing Eyes Closed Limitations Attempted performing standing in corner on a solid surface with narrow BOS, no UE support and small head movements with EC. The pt demonstrated postural sways and hip flexion and BUE support to regain balance. Balance with EC and head movements were deferred for the remainder in session.            Issued to HEP today with no issues noted with performance in session today. Min guard assist for safety with cues on form/technique.  Instructed pt in the following HEP exercises:  Access Code: [email or web address] URL: [email or web address] Date: 10/24/2021 Prepared by: Willow Ora   Exercises Sit to Stand with Counter Support - 1 x daily - 5 x weekly - 1 sets - 10 reps Standing Hip Abduction with Counter Support - 1 x daily - 5 x weekly - 1 sets - 10 reps Standing Hip Extension with Counter Support - 1 x daily - 5 x weekly - 1 sets - 10 reps Standing March with Counter Support - 1 x daily - 5 x weekly - 1 sets - 10 reps Standing Balance in Corner with Eyes Closed - 1 x daily - 5 x weekly - 1 sets - 3 reps - 30 hold Standing in corner with eyes open and left/right & up/down head movements- 1 x daily - 5 x weekly - 3 sets - 10-15 reps Seated Hamstring Stretch - 1 x daily - 5 x weekly - 1 sets - 3 reps - 30 hold Seated Piriformis Stretch with Trunk Bend - 1 x daily - 5 x weekly - 1 sets - 3 reps - 30 hold    PT Education - 10/24/21 1423     Education Details HEP for strengthening and balance    Person(s) Educated Patient    Methods Explanation;Demonstration;Verbal cues;Handout    Comprehension Verbalized understanding;Returned demonstration;Verbal cues required;Need further instruction                 PT Short Term Goals - 10/17/21 0959       PT SHORT TERM  GOAL #1   Title Pt will be independent with HEP for strengthening and balance to continue gains at home.    Baseline no current HEP    Time 4    Period Weeks    Status New    Target Date 11/14/21      PT SHORT TERM GOAL #2   Title  Pt will decrease 5 x sit to stand from 12.47 sec to < 11 sec for improved balance and functional mobility.    Baseline 10/17/21 12.47 sec with hands from chair.    Time 4    Period Weeks    Status New    Target Date 11/14/21      PT SHORT TERM GOAL #3   Title Pt will ambulate >1000' on varied level surfaces with cane for improved community mobility.    Baseline 41' with cane supervision    Time 4    Period Weeks    Status New    Target Date 11/14/21               PT Long Term Goals - 10/17/21 1112       PT LONG TERM GOAL #1   Title Pt will increase Berg score from 34 to >40/56 for improved balance and functional mobility.    Baseline 10/17/21 34/56    Time 8    Period Weeks    Status New    Target Date 12/13/21      PT LONG TERM GOAL #2   Title Pt will increase gait speed to >1.06 m/s for improved community ambulator speed.    Baseline 10/17/21 0.31m/s    Time 8    Period Weeks    Status New    Target Date 12/13/21      PT LONG TERM GOAL #3   Title Pt will be able to perform sit to stand x 3 from chair without hands for improved functional strength.    Baseline 10/17/21 needs hands to rise    Time 8    Period Weeks    Status New    Target Date 12/13/21      PT LONG TERM GOAL #4   Title Pt will ambulate up/down 4 steps with 1 rail and cane, up/down ramp with cane and up/down curb with cane mod I for improved community access.    Baseline steps not assessed at eval    Time 8    Period Weeks    Status New    Target Date 12/13/21                   Plan - 10/24/21 0827     Clinical Impression Statement Today's skilled session was focussed on providing pt with and practicing an HEP. Pt in good spirits, and should  benefit from further skilled PT in order to improve strength and balence defecits.    Personal Factors and Comorbidities Comorbidity 3+    Comorbidities Type 2 DM, osteomyelitis, Charcot foot, abnormal EKG.    Examination-Activity Limitations Locomotion Level;Transfers;Stairs;Squat;Stand    Examination-Participation Restrictions Nurse, adult;Yard Work    Merchant navy officer Evolving/Moderate complexity    Rehab Potential Good    PT Frequency 2x / week    PT Duration 8 weeks    PT Treatment/Interventions ADLs/Self Care Home Management;DME Instruction;Gait training;Stair training;Functional mobility training;Therapeutic activities;Therapeutic exercise;Balance training;Neuromuscular re-education;Manual techniques;Prosthetic Training;Passive range of motion;Patient/family education;Vestibular    PT Next Visit Plan Next visit , f/u on HEP. Sit to stand, perhaps step-ups. Assess stairs. Balance training. Gait training with cane and without device.    PT Home Exercise Plan [email or web address]    Consulted and Agree with Plan of Care Patient             Patient will benefit from skilled therapeutic intervention in order to improve the following deficits and impairments:  Abnormal  gait, Decreased mobility, Decreased strength, Decreased balance, Decreased knowledge of use of DME, Decreased range of motion, Impaired sensation, Prosthetic Dependency  Visit Diagnosis: Other abnormalities of gait and mobility  Muscle weakness (generalized)  Abnormal posture  Unsteadiness on feet     Problem List Patient Active Problem List   Diagnosis Date Noted   Dyslipidemia 10/18/2020   Vitreous hemorrhage of right eye due to diabetes mellitus ([place]) 02/01/2019   Preop cardiovascular exam 10/22/2018   Chest pain 10/22/2018   Vitreous hemorrhage, right eye ([place]) 09/21/2018   Proliferative diabetic retinopathy with macular edema ([place]) 09/21/2018   Non-pressure chronic ulcer of  left calf, limited to breakdown of skin ([place]) 11/12/2016   Status post bilateral below knee amputation ([place]) 11/12/2016   Normocytic anemia 10/04/2016   Osteomyelitis ([place]) 10/04/2016   Finger infection 10/04/2016   Osteomyelitis of finger ([place]) 10/04/2016   Gastroparesis 11/02/2014   Diabetes mellitus due to underlying condition with other specified complication ([place])    Diabetic foot ([facility])    Nausea & vomiting    Essential hypertension 10/25/2014   GERD (gastroesophageal reflux disease) 10/25/2014   Cellulitis 10/24/2014   Diabetes ([place]) 10/24/2014   Insulin dependent diabetes mellitus ([place]) 10/24/2014    Rondel Baton, SPTA 10/24/2021, 9:22 AM  [facility] [address] [place], [place], [address] Phone: [phone]   Fax:  [phone]  Name: Diana Coleman MRN: [id number] Date of Birth: [birth year]  This note has been reviewed and edited by supervising CI.   Willow Ora, PTA, [facility] [address] [place], [place] [address] [phone] 10/24/21, 2:27 PM

## 2021-10-29 ENCOUNTER — Ambulatory Visit: Payer: Medicare Other | Admitting: Rehabilitation

## 2021-10-29 ENCOUNTER — Encounter: Payer: Self-pay | Admitting: Rehabilitation

## 2021-10-29 ENCOUNTER — Other Ambulatory Visit: Payer: Self-pay

## 2021-10-29 DIAGNOSIS — R2689 Other abnormalities of gait and mobility: Secondary | ICD-10-CM

## 2021-10-29 DIAGNOSIS — R293 Abnormal posture: Secondary | ICD-10-CM

## 2021-10-29 DIAGNOSIS — M6281 Muscle weakness (generalized): Secondary | ICD-10-CM

## 2021-10-29 DIAGNOSIS — R2681 Unsteadiness on feet: Secondary | ICD-10-CM

## 2021-10-29 NOTE — Therapy (Signed)
[facility] [address] [place], [place], [address] Phone: [phone]   Fax:  [phone]  Physical Therapy Treatment  Patient Details  Name: Diana Coleman MRN: [id number] Date of Birth: [birth year] Referring Provider (PT): Keith Rake   Encounter Date: 10/29/2021   PT End of Session - 10/29/21 0959     Visit Number 3    Number of Visits 17    Date for PT Re-Evaluation 12/13/21    Authorization Type UHC medicare so 10th visit progress note    PT Start Time 0847    PT Stop Time 0928    PT Time Calculation (min) 41 min    Equipment Utilized During Treatment Gait belt    Activity Tolerance Patient tolerated treatment well    Behavior During Therapy [facility] for tasks assessed/performed             Past Medical History:  Diagnosis Date   Cellulitis of foot, left 10/24/2014   hx/notes 10/24/2014   Charcot foot due to diabetes mellitus ([place])    Hepatitis    Hep B    Hypertension    Osteomyelitis of right foot ([place])    hx/notes 10/24/2014   Proliferative diabetic retinopathy ([place])    right eye and vitreous hemorrhage   Type II diabetes mellitus ([place])    Since 1998    Past Surgical History:  Procedure Laterality Date   ABOVE KNEE LEG AMPUTATION Right 2004   AMPUTATION Left 10/29/2014   Procedure: AMPUTATION BELOW KNEE - LEFT;  Surgeon: Newt Minion, MD;  Location: [place] [facility];  Service: Orthopedics;  Laterality: Left;   CATARACT EXTRACTION W/ INTRAOCULAR LENS  IMPLANT, BILATERAL     FOOT SURGERY Left 1980's   "ulcer removed"   I & D EXTREMITY Right 10/04/2016   Procedure: IRRIGATION AND DEBRIDEMENT MIDDLE FINGER AND AMPUTATION;  Surgeon: Iran Planas, MD;  Location: [facility];  Service: Orthopedics;  Laterality: Right;   MEMBRANE PEEL Right 02/01/2019   Procedure: Carvel Getting;  Surgeon: Hayden Pedro, MD;  Location: [facility];  Service: Ophthalmology;  Laterality: Right;   PARS PLANA VITRECTOMY 27 GAUGE Right 02/01/2019    PARS PLANA VITRECTOMY 27 GAUGE Right 02/01/2019   Procedure: PARS PLANA VITRECTOMY 27 GAUGE, MEMBRANE PEEL, ENDOLASER, GAS INJECTION;  Surgeon: Hayden Pedro, MD;  Location: [facility];  Service: Ophthalmology;  Laterality: Right;   REDUCTION MAMMAPLASTY Bilateral 1987   TOE AMPUTATION Left ~ 2011   "top of my 3rd toe"   TUBAL LIGATION  1973    There were no vitals filed for this visit.   Subjective Assessment - 10/29/21 0849     Subjective Pt has no falls, pain, or any complaints this session. Pt reports that she hasn't done any of the HEP yet.    Pertinent History Type 2 DM, osteomyelitis, Charcot foot, abnormal EKG.    Patient Stated Goals Pt would like to strengthen her legs and be able to get up from chair easier.    Currently in Pain? No/denies                               [facility] Adult PT Treatment/Exercise - 10/29/21 0850       Transfers   Sit to Stand 5: Supervision;With upper extremity assist    Stand to Sit 5: Supervision;With upper extremity assist      Ambulation/Gait   Ambulation/Gait Yes  Ambulation/Gait Assistance 5: Supervision;4: Min guard    Ambulation/Gait Assistance Details Pt required supervision to ambulate with straight cane with quad tip. Pt demonstrated increased gait speed and lateral trunk flexion to R and L. The pt required min guard to ambulate 1 lap around the track with no AD, demostrating decreased stance time on RLE, exagerated arm swing on R, and lateral hip instability on R. Also noted during ambulation with and without AD: during stance phase on R, pt's prosthesis was observed to shift medially. When discussed with pt, she reported that she has been working with prosthetist to correct issue.    Ambulation Distance (Feet) 300 Feet   2 laps around track and in/out of session.   Assistive device Straight cane;Prostheses;None   pt ambulated in/out of session and 1 lap around track with straight cane with quad tip. Pt ambulated 1 lap  around track with no device.   Gait Pattern Step-through pattern;Lateral trunk lean to right;Lateral trunk lean to left;Wide base of support;Decreased stance time - left    Ambulation Surface Level;Indoor      Exercises   Exercises Knee/Hip      Knee/Hip Exercises: Aerobic   Other Aerobic SciFit Level 3 for 8 min for LE strengthening and to increase activity tolerance.      Knee/Hip Exercises: Standing   Lateral Step Up Both;3 sets;10 reps;Hand Hold: 2;Hand Hold: 1;Step Height: 4";Limitations    Lateral Step Up Limitations The pt performed 1 set of 10 reps stepping up with each LE with B UE support, and 1 set of 5 reps on the R with 1 UE support. The pt began demonstrating decreased foot clearence due to fatigue, and required cues to engage hip extensors to remain tall.    Forward Step Up Both;4 sets;10 reps;Hand Hold: 2;Hand Hold: 1;Step Height: 4";Limitations    Forward Step Up Limitations Pt placed in // bars with 4" step. Pt performed 1 set of step ups x 10 each side with B UE support. Pt performed 1 set of step ups x 10 each side with R UE support. Pt demonstrated increased circumduction when stepping up with each LE, requiring cues to increase knee flexion instead of circumduction. Pt required min gard/assist to perform with B UE support and single UE support. The pt demonstrated increased trunk flexion when performing with single UE support and required cues to stand tall.                       PT Short Term Goals - 10/17/21 0959       PT SHORT TERM GOAL #1   Title Pt will be independent with HEP for strengthening and balance to continue gains at home.    Baseline no current HEP    Time 4    Period Weeks    Status New    Target Date 11/14/21      PT SHORT TERM GOAL #2   Title Pt will decrease 5 x sit to stand from 12.47 sec to < 11 sec for improved balance and functional mobility.    Baseline 10/17/21 12.47 sec with hands from chair.    Time 4    Period Weeks     Status New    Target Date 11/14/21      PT SHORT TERM GOAL #3   Title Pt will ambulate >1000' on varied level surfaces with cane for improved community mobility.    Baseline 59' with cane supervision  Time 4    Period Weeks    Status New    Target Date 11/14/21               PT Long Term Goals - 10/17/21 1112       PT LONG TERM GOAL #1   Title Pt will increase Berg score from 34 to >40/56 for improved balance and functional mobility.    Baseline 10/17/21 34/56    Time 8    Period Weeks    Status New    Target Date 12/13/21      PT LONG TERM GOAL #2   Title Pt will increase gait speed to >1.06 m/s for improved community ambulator speed.    Baseline 10/17/21 0.4m/s    Time 8    Period Weeks    Status New    Target Date 12/13/21      PT LONG TERM GOAL #3   Title Pt will be able to perform sit to stand x 3 from chair without hands for improved functional strength.    Baseline 10/17/21 needs hands to rise    Time 8    Period Weeks    Status New    Target Date 12/13/21      PT LONG TERM GOAL #4   Title Pt will ambulate up/down 4 steps with 1 rail and cane, up/down ramp with cane and up/down curb with cane mod I for improved community access.    Baseline steps not assessed at eval    Time 8    Period Weeks    Status New    Target Date 12/13/21                   Plan - 10/29/21 0959     Clinical Impression Statement Today's skilled session was focussed on exercises to strengthen B LEs and to improve activity tolerance, as well as gait with and without AD. Pt showed some progress towards goals, and should continue to benefit from skilled PT to address functional limitations.    Personal Factors and Comorbidities Comorbidity 3+    Comorbidities Type 2 DM, osteomyelitis, Charcot foot, abnormal EKG.    Examination-Activity Limitations Locomotion Level;Transfers;Stairs;Squat;Stand    Examination-Participation Restrictions Production assistant, radio;Yard Work    Merchant navy officer Evolving/Moderate complexity    Rehab Potential Good    PT Frequency 2x / week    PT Duration 8 weeks    PT Treatment/Interventions ADLs/Self Care Home Management;DME Instruction;Gait training;Stair training;Functional mobility training;Therapeutic activities;Therapeutic exercise;Balance training;Neuromuscular re-education;Manual techniques;Prosthetic Training;Passive range of motion;Patient/family education;Vestibular    PT Next Visit Plan Next visit f/u on HEP. Sit to stands, LE, and core strengthening. Assess stairs. Balance training. Gait training with cane and without device.    PT Home Exercise Plan [email or web address]    Consulted and Agree with Plan of Care Patient             Patient will benefit from skilled therapeutic intervention in order to improve the following deficits and impairments:  Abnormal gait, Decreased mobility, Decreased strength, Decreased balance, Decreased knowledge of use of DME, Decreased range of motion, Impaired sensation, Prosthetic Dependency  Visit Diagnosis: Other abnormalities of gait and mobility  Muscle weakness (generalized)  Abnormal posture  Unsteadiness on feet     Problem List Patient Active Problem List   Diagnosis Date Noted   Dyslipidemia 10/18/2020   Vitreous hemorrhage of right eye due to diabetes mellitus ([place]) 02/01/2019   Preop cardiovascular exam 10/22/2018  Chest pain 10/22/2018   Vitreous hemorrhage, right eye ([place]) 09/21/2018   Proliferative diabetic retinopathy with macular edema ([place]) 09/21/2018   Non-pressure chronic ulcer of left calf, limited to breakdown of skin ([place]) 11/12/2016   Status post bilateral below knee amputation ([place]) 11/12/2016   Normocytic anemia 10/04/2016   Osteomyelitis ([place]) 10/04/2016   Finger infection 10/04/2016   Osteomyelitis of finger ([place]) 10/04/2016   Gastroparesis 11/02/2014   Diabetes mellitus due to underlying condition with other  specified complication ([place])    Diabetic foot ([facility])    Nausea & vomiting    Essential hypertension 10/25/2014   GERD (gastroesophageal reflux disease) 10/25/2014   Cellulitis 10/24/2014   Diabetes ([place]) 10/24/2014   Insulin dependent diabetes mellitus ([place]) 10/24/2014    Rondel Baton, SPTA 10/29/2021, 12:36 PM  [name] [address] [place], [place], [address] Phone: [phone]   Fax:  [phone]  Name: Diana Coleman MRN: [id number] Date of Birth: 12/08/1951

## 2021-10-31 ENCOUNTER — Other Ambulatory Visit: Payer: Self-pay

## 2021-10-31 ENCOUNTER — Ambulatory Visit: Payer: Medicare Other | Admitting: Rehabilitation

## 2021-10-31 ENCOUNTER — Encounter: Payer: Self-pay | Admitting: Rehabilitation

## 2021-10-31 DIAGNOSIS — M6281 Muscle weakness (generalized): Secondary | ICD-10-CM

## 2021-10-31 DIAGNOSIS — R2689 Other abnormalities of gait and mobility: Secondary | ICD-10-CM | POA: Diagnosis not present

## 2021-10-31 DIAGNOSIS — R293 Abnormal posture: Secondary | ICD-10-CM

## 2021-10-31 DIAGNOSIS — R2681 Unsteadiness on feet: Secondary | ICD-10-CM

## 2021-10-31 NOTE — Therapy (Signed)
[facility] [address] [place], [place], [address] Phone: [phone]   Fax:  [phone]  Physical Therapy Treatment  Patient Details  Name: Diana Coleman MRN: [id number] Date of Birth: 18-Sep-1951 Referring Provider (PT): Keith Rake   Encounter Date: 10/31/2021   PT End of Session - 10/31/21 1301     Visit Number 4    Number of Visits 17    Date for PT Re-Evaluation 12/13/21    Authorization Type [employer] medicare so 10th visit progress note    PT Start Time 0802    PT Stop Time 0845    PT Time Calculation (min) 43 min    Equipment Utilized During Treatment Gait belt    Activity Tolerance Patient tolerated treatment well    Behavior During Therapy [facility] for tasks assessed/performed             Past Medical History:  Diagnosis Date   Cellulitis of foot, left 10/24/2014   hx/notes 10/24/2014   Charcot foot due to diabetes mellitus ([employer])    Hepatitis    Hep B    Hypertension    Osteomyelitis of right foot ([place])    hx/notes 10/24/2014   Proliferative diabetic retinopathy ([place])    right eye and vitreous hemorrhage   Type II diabetes mellitus ([place])    Since 1998    Past Surgical History:  Procedure Laterality Date   ABOVE KNEE LEG AMPUTATION Right 2004   AMPUTATION Left 10/29/2014   Procedure: AMPUTATION BELOW KNEE - LEFT;  Surgeon: Newt Minion, MD;  Location: [facility];  Service: Orthopedics;  Laterality: Left;   CATARACT EXTRACTION W/ INTRAOCULAR LENS  IMPLANT, BILATERAL     FOOT SURGERY Left 1980's   "ulcer removed"   I & D EXTREMITY Right 10/04/2016   Procedure: IRRIGATION AND DEBRIDEMENT MIDDLE FINGER AND AMPUTATION;  Surgeon: Iran Planas, MD;  Location: [facility];  Service: Orthopedics;  Laterality: Right;   MEMBRANE PEEL Right 02/01/2019   Procedure: Carvel Getting;  Surgeon: Hayden Pedro, MD;  Location: [facility];  Service: Ophthalmology;  Laterality: Right;   PARS PLANA VITRECTOMY 27 GAUGE Right 02/01/2019    PARS PLANA VITRECTOMY 27 GAUGE Right 02/01/2019   Procedure: PARS PLANA VITRECTOMY 27 GAUGE, MEMBRANE PEEL, ENDOLASER, GAS INJECTION;  Surgeon: Hayden Pedro, MD;  Location: [facility];  Service: Ophthalmology;  Laterality: Right;   REDUCTION MAMMAPLASTY Bilateral 1987   TOE AMPUTATION Left ~ 2011   "top of my 3rd toe"   TUBAL LIGATION  1973    There were no vitals filed for this visit.   Subjective Assessment - 10/31/21 0807     Subjective Pt reports no changes since last session. She did exercises yesterday.    Pertinent History Type 2 DM, osteomyelitis, Charcot foot, abnormal EKG.    Patient Stated Goals Pt would like to strengthen her legs and be able to get up from chair easier.    Currently in Pain? No/denies                               [facility] Adult PT Treatment/Exercise - 10/31/21 0808       Transfers   Transfers Sit to Stand;Stand to Sit    Sit to Stand 5: Supervision    Stand to Sit 5: Supervision;With upper extremity assist      Ambulation/Gait   Ambulation/Gait Yes    Ambulation/Gait Assistance 5: Supervision  Ambulation/Gait Assistance Details Pt ambulatory without device during session.  Continues to need cues for more narrowed BOS, esp without device.  Mild instability but no overt LOB.    Ambulation Distance (Feet) 200 Feet    Assistive device None    Gait Pattern Step-through pattern;Lateral trunk lean to right;Lateral trunk lean to left;Wide base of support;Decreased stance time - left    Ambulation Surface Level;Indoor      Therapeutic Activites    Therapeutic Activities Other Therapeutic Activities    Other Therapeutic Activities Practiced lifting/lowering crate as she is going to a friends house.  First did crate alone with demo on how to put feet on either corner so that crate may be as close as possible to body when lifting and lowering.  Performed x 4 reps with min cues throughout.  Then added 5lb to crate and lifted/lowered x 4 more  reps (also performing short distance gait to simulate carrying items across friends living room).  Pt able to perform at S level.  She also reports that she will have to move her couch as well and that she will do it with her hip therefore had her stand and "hip bump" chair to the R as she will do at friends house.  Pt able to do at mod I level with support on back of chair.      Exercises   Exercises Other Exercises    Other Exercises  Side stepping with red theraband around thighs x 4 laps with intermittent UE support as needed.  Marching in place with red band x 20 reps with single UE support with cues for improved post pelvic tilt and upright posture, hip activation for less lateral trunk movememt.  Sit<>stand without UE support from slightly elevated mat surface x 10 reps (chair in front for target to "reach over").  Also cues and demo for slow controlled descent.      Knee/Hip Exercises: Aerobic   Stepper Scifit stepper x 8 mins at level 3 with BUEs/LEs maintaining rpms in 90-100's.  Tolerated well.                       PT Short Term Goals - 10/17/21 0959       PT SHORT TERM GOAL #1   Title Pt will be independent with HEP for strengthening and balance to continue gains at home.    Baseline no current HEP    Time 4    Period Weeks    Status New    Target Date 11/14/21      PT SHORT TERM GOAL #2   Title Pt will decrease 5 x sit to stand from 12.47 sec to < 11 sec for improved balance and functional mobility.    Baseline 10/17/21 12.47 sec with hands from chair.    Time 4    Period Weeks    Status New    Target Date 11/14/21      PT SHORT TERM GOAL #3   Title Pt will ambulate >1000' on varied level surfaces with cane for improved community mobility.    Baseline 104' with cane supervision    Time 4    Period Weeks    Status New    Target Date 11/14/21               PT Long Term Goals - 10/17/21 1112       PT LONG TERM GOAL #1   Title Pt will increase  Berg score from 34 to >40/56 for improved balance and functional mobility.    Baseline 10/17/21 34/56    Time 8    Period Weeks    Status New    Target Date 12/13/21      PT LONG TERM GOAL #2   Title Pt will increase gait speed to >1.06 m/s for improved community ambulator speed.    Baseline 10/17/21 0.52m/s    Time 8    Period Weeks    Status New    Target Date 12/13/21      PT LONG TERM GOAL #3   Title Pt will be able to perform sit to stand x 3 from chair without hands for improved functional strength.    Baseline 10/17/21 needs hands to rise    Time 8    Period Weeks    Status New    Target Date 12/13/21      PT LONG TERM GOAL #4   Title Pt will ambulate up/down 4 steps with 1 rail and cane, up/down ramp with cane and up/down curb with cane mod I for improved community access.    Baseline steps not assessed at eval    Time 8    Period Weeks    Status New    Target Date 12/13/21                   Plan - 10/31/21 1301     Clinical Impression Statement Skilled session focused on BLE strengthening and functional lifting/lowering as she will be doing this weekend at friends home to put Christmas decorations up.    Personal Factors and Comorbidities Comorbidity 3+    Comorbidities Type 2 DM, osteomyelitis, Charcot foot, abnormal EKG.    Examination-Activity Limitations Locomotion Level;Transfers;Stairs;Squat;Stand    Examination-Participation Restrictions Nurse, adult;Yard Work    [employer]chant [employer] officer Evolving/Moderate complexity    Rehab Potential Good    PT Frequency 2x / week    PT Duration 8 weeks    PT Treatment/Interventions ADLs/Self Care Home Management;DME Instruction;Gait training;Stair training;Functional mobility training;Therapeutic activities;Therapeutic exercise;Balance training;Neuromuscular re-education;Manual techniques;Prosthetic Training;Passive range of motion;Patient/family education;Vestibular    PT Next  Visit Plan Still doing HEP?Marland Kitchen Sit to stands, LE, and core strengthening. Assess stairs. Balance training. Gait training with cane and without device.    PT Home Exercise Plan [id number]    Consulted and Agree with Plan of Care Patient             Patient will benefit from skilled therapeutic intervention in order to improve the following deficits and impairments:  Abnormal gait, Decreased mobility, Decreased strength, Decreased balance, Decreased knowledge of use of DME, Decreased range of motion, Impaired sensation, Prosthetic Dependency  Visit Diagnosis: Other abnormalities of gait and mobility  Muscle weakness (generalized)  Abnormal posture  Unsteadiness on feet     Problem List Patient Active Problem List   Diagnosis Date Noted   Dyslipidemia 10/18/2020   Vitreous hemorrhage of right eye due to diabetes mellitus ([place]) 02/01/2019   Preop cardiovascular exam 10/22/2018   Chest pain 10/22/2018   Vitreous hemorrhage, right eye ([place]) 09/21/2018   Proliferative diabetic retinopathy with macular edema ([place]) 09/21/2018   Non-pressure chronic ulcer of left calf, limited to breakdown of skin ([place]) 11/12/2016   Status post bilateral below knee amputation ([place]) 11/12/2016   Normocytic anemia 10/04/2016   Osteomyelitis ([place]) 10/04/2016   Finger infection 10/04/2016   Osteomyelitis of finger ([place]) 10/04/2016   Gastroparesis 11/02/2014   Diabetes  mellitus due to underlying condition with other specified complication ([place])    Diabetic foot ([place])    Nausea & vomiting    Essential hypertension 10/25/2014   GERD (gastroesophageal reflux disease) 10/25/2014   Cellulitis 10/24/2014   Diabetes ([place]) 10/24/2014   Insulin dependent diabetes mellitus ([place]) 10/24/2014    Cameron Sprang, PT, MPT [facility] [address] [place], [place], [address] Phone: [phone]   Fax:  [phone] 10/31/21, 1:05 PM   Name: SEERAT PEADEN MRN:  [id number] Date of Birth: 02-04-1951

## 2021-11-05 ENCOUNTER — Encounter: Payer: Self-pay | Admitting: Physical Therapy

## 2021-11-05 ENCOUNTER — Other Ambulatory Visit: Payer: Self-pay

## 2021-11-05 ENCOUNTER — Ambulatory Visit: Payer: Medicare Other | Admitting: Physical Therapy

## 2021-11-05 DIAGNOSIS — R2689 Other abnormalities of gait and mobility: Secondary | ICD-10-CM

## 2021-11-05 DIAGNOSIS — R2681 Unsteadiness on feet: Secondary | ICD-10-CM

## 2021-11-05 DIAGNOSIS — R293 Abnormal posture: Secondary | ICD-10-CM

## 2021-11-05 DIAGNOSIS — M6281 Muscle weakness (generalized): Secondary | ICD-10-CM

## 2021-11-05 NOTE — Therapy (Signed)
[facility] [address] [place], [place], [address] Phone: [phone]   Fax:  [phone]  Physical Therapy Treatment  Patient Details  Name: Diana Coleman MRN: [id number] Date of Birth: 1951-11-05 Referring Provider (PT): Keith Rake   Encounter Date: 11/05/2021   PT End of Session - 11/05/21 0724     Visit Number 5    Number of Visits 17    Date for PT Re-Evaluation 12/13/21    Authorization Type UHC medicare so 10th visit progress note    PT Start Time 0719    PT Stop Time 0800    PT Time Calculation (min) 41 min    Equipment Utilized During Treatment Gait belt    Activity Tolerance Patient tolerated treatment well    Behavior During Therapy [facility] for tasks assessed/performed             Past Medical History:  Diagnosis Date   Cellulitis of foot, left 10/24/2014   hx/notes 10/24/2014   Charcot foot due to diabetes mellitus ([place])    Hepatitis    Hep B    Hypertension    Osteomyelitis of right foot ([place])    hx/notes 10/24/2014   Proliferative diabetic retinopathy ([place])    right eye and vitreous hemorrhage   Type II diabetes mellitus ([place])    Since 1998    Past Surgical History:  Procedure Laterality Date   ABOVE KNEE LEG AMPUTATION Right 2004   AMPUTATION Left 10/29/2014   Procedure: AMPUTATION BELOW KNEE - LEFT;  Surgeon: Newt Minion, MD;  Location: [facility];  Service: Orthopedics;  Laterality: Left;   CATARACT EXTRACTION W/ INTRAOCULAR LENS  IMPLANT, BILATERAL     FOOT SURGERY Left 1980's   "ulcer removed"   I & D EXTREMITY Right 10/04/2016   Procedure: IRRIGATION AND DEBRIDEMENT MIDDLE FINGER AND AMPUTATION;  Surgeon: Iran Planas, MD;  Location: [facility];  Service: Orthopedics;  Laterality: Right;   MEMBRANE PEEL Right 02/01/2019   Procedure: Carvel Getting;  Surgeon: Hayden Pedro, MD;  Location: [facility];  Service: Ophthalmology;  Laterality: Right;   PARS PLANA VITRECTOMY 27 GAUGE Right 02/01/2019    PARS PLANA VITRECTOMY 27 GAUGE Right 02/01/2019   Procedure: PARS PLANA VITRECTOMY 27 GAUGE, MEMBRANE PEEL, ENDOLASER, GAS INJECTION;  Surgeon: Hayden Pedro, MD;  Location: [facility];  Service: Ophthalmology;  Laterality: Right;   REDUCTION MAMMAPLASTY Bilateral 1987   TOE AMPUTATION Left ~ 2011   "top of my 3rd toe"   TUBAL LIGATION  1973    There were no vitals filed for this visit.   Subjective Assessment - 11/05/21 0722     Subjective No new complaitns. No falls. Continues to have discomfort at times in back. Pt feels its due to right prosthesis rotation out at times which she reports the prosthetist has not been able to fix it.    Pertinent History Type 2 DM, osteomyelitis, Charcot foot, abnormal EKG.    Patient Stated Goals Pt would like to strengthen her legs and be able to get up from chair easier.    Currently in Pain? No/denies                    [facility] Adult PT Treatment/Exercise - 11/05/21 0725       Transfers   Transfers Sit to Stand;Stand to Sit    Sit to Stand 5: Supervision;With upper extremity assist;From bed;From chair/3-in-1    Stand to Sit 5: Supervision;With upper  extremity assist;To bed;To chair/3-in-1      Ambulation/Gait   Ambulation/Gait Yes    Ambulation/Gait Assistance 5: Supervision    Ambulation Distance (Feet) --   around clinic with session   Assistive device None    Gait Pattern Step-through pattern;Lateral trunk lean to right;Lateral trunk lean to left;Wide base of support;Decreased stance time - left    Ambulation Surface Level;Indoor      High Level Balance   High Level Balance Activities Side stepping;Marching forwards;Backward walking    High Level Balance Comments in parallel bars: light to no UE support for 3 laps each with cues on posture, form and weight shifting.      Neuro Re-ed    Neuro Re-ed Details  for balance/coordination/muscle re-ed: gait around track working on scanning all directions randomly with min guard assist  for safety.      Knee/Hip Exercises: Aerobic   Other Aerobic SciFit Level 3.5 for 8 minutes with UE/LE's with goal >/= 50 steps per minute for strengthening and activity tolerance.                 Balance Exercises - 11/05/21 0756       Balance Exercises: Standing   Standing Eyes Closed Narrow base of support (BOS);Foam/compliant surface;3 reps;30 secs;Limitations    Standing Eyes Closed Limitations on airex with occasional touch to bars for balance with cues on posture/weight shifting to assist with balance recovery.    Partial Tandem Stance Eyes closed;Foam/compliant surface;Intermittent upper extremity support;2 reps;30 secs;Limitations    Partial Tandem Stance Limitations on airex with occasional touch to bars for 2 reps each foot forward. cues on posture/weight shifting to assist with balance.                  PT Short Term Goals - 10/17/21 0959       PT SHORT TERM GOAL #1   Title Pt will be independent with HEP for strengthening and balance to continue gains at home.    Baseline no current HEP    Time 4    Period Weeks    Status New    Target Date 11/14/21      PT SHORT TERM GOAL #2   Title Pt will decrease 5 x sit to stand from 12.47 sec to < 11 sec for improved balance and functional mobility.    Baseline 10/17/21 12.47 sec with hands from chair.    Time 4    Period Weeks    Status New    Target Date 11/14/21      PT SHORT TERM GOAL #3   Title Pt will ambulate >1000' on varied level surfaces with cane for improved community mobility.    Baseline 80' with cane supervision    Time 4    Period Weeks    Status New    Target Date 11/14/21               PT Long Term Goals - 10/17/21 1112       PT LONG TERM GOAL #1   Title Pt will increase Berg score from 34 to >40/56 for improved balance and functional mobility.    Baseline 10/17/21 34/56    Time 8    Period Weeks    Status New    Target Date 12/13/21      PT LONG TERM GOAL #2   Title  Pt will increase gait speed to >1.06 m/s for improved community ambulator speed.    Baseline 10/17/21  0.83m/s    Time 8    Period Weeks    Status New    Target Date 12/13/21      PT LONG TERM GOAL #3   Title Pt will be able to perform sit to stand x 3 from chair without hands for improved functional strength.    Baseline 10/17/21 needs hands to rise    Time 8    Period Weeks    Status New    Target Date 12/13/21      PT LONG TERM GOAL #4   Title Pt will ambulate up/down 4 steps with 1 rail and cane, up/down ramp with cane and up/down curb with cane mod I for improved community access.    Baseline steps not assessed at eval    Time 8    Period Weeks    Status New    Target Date 12/13/21                   Plan - 11/05/21 0724     Clinical Impression Statement Today's skilled session continued to focus on strengthening, gait with prostheses only and balance training with rest breaks taken as needed due to low back/right hip discomfort. No other issues noted or reported in session. The pt is making progress toward goals and should benefit from continued PT to progress toward unmet goals.    Personal Factors and Comorbidities Comorbidity 3+    Comorbidities Type 2 DM, osteomyelitis, Charcot foot, abnormal EKG.    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 Assess stairs. Balance training. Gait training with cane and without device.    PT Home Exercise Plan [id number]    Consulted and Agree with Plan of Care Patient             Patient will benefit from skilled therapeutic intervention in order to improve the following deficits and impairments:  Abnormal gait, Decreased mobility, Decreased strength, Decreased balance, Decreased knowledge of use of DME, Decreased range of motion, Impaired sensation, Prosthetic Dependency  Visit Diagnosis: Other abnormalities of gait and mobility  Muscle weakness (generalized)  Abnormal posture  Unsteadiness on feet     Problem List Patient Active Problem List   Diagnosis Date Noted   Dyslipidemia 10/18/2020   Vitreous hemorrhage of right eye due to diabetes mellitus ([place]) 02/01/2019   Preop cardiovascular exam 10/22/2018   Chest pain 10/22/2018   Vitreous hemorrhage, right eye ([place]) 09/21/2018   Proliferative diabetic retinopathy with macular edema ([place]) 09/21/2018   Non-pressure chronic ulcer of left calf, limited to breakdown of skin ([place]) 11/12/2016   Status post bilateral below knee amputation ([place]) 11/12/2016   Normocytic anemia 10/04/2016   Osteomyelitis ([place]) 10/04/2016   Finger infection 10/04/2016   Osteomyelitis of finger ([place]) 10/04/2016   Gastroparesis 11/02/2014   Diabetes mellitus due to underlying condition with other specified complication ([place])    Diabetic foot ([facility])    Nausea & vomiting    Essential hypertension 10/25/2014   GERD (gastroesophageal reflux disease) 10/25/2014   Cellulitis 10/24/2014   Diabetes ([place]) 10/24/2014  Insulin dependent diabetes mellitus ([place]) 10/24/2014   Willow Ora, PTA, [facility] [address] [place], [place] [address] [phone] 11/05/21, 8:25 AM   Name: REFUGIA LANEVE MRN: [id number] Date of Birth: Dec 31, 1950

## 2021-11-07 ENCOUNTER — Other Ambulatory Visit: Payer: Self-pay

## 2021-11-07 ENCOUNTER — Encounter: Payer: Self-pay | Admitting: Physical Therapy

## 2021-11-07 ENCOUNTER — Ambulatory Visit: Payer: Medicare Other | Admitting: Physical Therapy

## 2021-11-07 DIAGNOSIS — R293 Abnormal posture: Secondary | ICD-10-CM

## 2021-11-07 DIAGNOSIS — M6281 Muscle weakness (generalized): Secondary | ICD-10-CM

## 2021-11-07 DIAGNOSIS — R2681 Unsteadiness on feet: Secondary | ICD-10-CM

## 2021-11-07 DIAGNOSIS — R2689 Other abnormalities of gait and mobility: Secondary | ICD-10-CM | POA: Diagnosis not present

## 2021-11-07 NOTE — Therapy (Signed)
[facility] [address] [place], [place], [address] Phone: [phone]   Fax:  [phone]  Physical Therapy Treatment  Patient Details  Name: Diana Coleman MRN: [id number] Date of Birth: 04-21-1951 Referring Provider (PT): Keith Rake   Encounter Date: 11/07/2021   PT End of Session - 11/07/21 0812     Visit Number 6    Number of Visits 17    Date for PT Re-Evaluation 12/13/21    Authorization Type UHC medicare so 10th visit progress note    PT Start Time 0718    PT Stop Time 0756    PT Time Calculation (min) 38 min    Equipment Utilized During Treatment Gait belt    Activity Tolerance Patient tolerated treatment well    Behavior During Therapy [facility] for tasks assessed/performed             Past Medical History:  Diagnosis Date   Cellulitis of foot, left 10/24/2014   hx/notes 10/24/2014   Charcot foot due to diabetes mellitus ([place])    Hepatitis    Hep B    Hypertension    Osteomyelitis of right foot ([place])    hx/notes 10/24/2014   Proliferative diabetic retinopathy ([place])    right eye and vitreous hemorrhage   Type II diabetes mellitus ([place])    Since 1998    Past Surgical History:  Procedure Laterality Date   ABOVE KNEE LEG AMPUTATION Right 2004   AMPUTATION Left 10/29/2014   Procedure: AMPUTATION BELOW KNEE - LEFT;  Surgeon: Newt Minion, MD;  Location: [facility];  Service: Orthopedics;  Laterality: Left;   CATARACT EXTRACTION W/ INTRAOCULAR LENS  IMPLANT, BILATERAL     FOOT SURGERY Left 1980's   "ulcer removed"   I & D EXTREMITY Right 10/04/2016   Procedure: IRRIGATION AND DEBRIDEMENT MIDDLE FINGER AND AMPUTATION;  Surgeon: Iran Planas, MD;  Location: [facility];  Service: Orthopedics;  Laterality: Right;   MEMBRANE PEEL Right 02/01/2019   Procedure: Carvel Getting;  Surgeon: Hayden Pedro, MD;  Location: [facility];  Service: Ophthalmology;  Laterality: Right;   PARS PLANA VITRECTOMY 27 GAUGE Right 02/01/2019    PARS PLANA VITRECTOMY 27 GAUGE Right 02/01/2019   Procedure: PARS PLANA VITRECTOMY 27 GAUGE, MEMBRANE PEEL, ENDOLASER, GAS INJECTION;  Surgeon: Hayden Pedro, MD;  Location: [facility];  Service: Ophthalmology;  Laterality: Right;   REDUCTION MAMMAPLASTY Bilateral 1987   TOE AMPUTATION Left ~ 2011   "top of my 3rd toe"   TUBAL LIGATION  1973    There were no vitals filed for this visit.   Subjective Assessment - 11/07/21 0720     Subjective No new complaints, falls, or pain today. "I feel like it's been easier to get up lately. I'm definitly seeing some improvement there."    Pertinent History Type 2 DM, osteomyelitis, Charcot foot, abnormal EKG.    Patient Stated Goals Pt would like to strengthen her legs and be able to get up from chair easier.    Currently in Pain? No/denies                    [facility] Adult PT Treatment/Exercise - 11/07/21 0722       Transfers   Transfers Sit to Stand;Stand to Sit    Sit to Stand 5: Supervision;With upper extremity assist;From bed;From chair/3-in-1    Sit to Stand Details (indicate cue type and reason) Sit<>stand x 10 each in staggered stance with each foot  forward. Pt required min guard, occaisional cues to stand tall during standing, and intermittent light couch on back of chair to maintain balance.    Five time sit to stand comments  9.94 sec with hands from chair    Stand to Sit 5: Supervision;With upper extremity assist;To bed;To chair/3-in-1      Ambulation/Gait   Ambulation/Gait Yes    Ambulation/Gait Assistance 5: Supervision    Ambulation Distance (Feet) --   in/out of session with straight tip cane with quad tip. Throughout session with no AD.   Assistive device Straight cane;None;Prostheses   straight can with quad tip   Gait Pattern Step-through pattern;Lateral trunk lean to right;Lateral trunk lean to left;Wide base of support;Decreased stance time - left    Ambulation Surface Level;Indoor      Exercises   Other Exercises   alternating sidestepping and hip ext with red theraband at counter with UE support. Pt performed 10 each side, requiring min guard and cues to stand tall during hip ext.      Knee/Hip Exercises: Stretches   Hip Flexor Stretch Both;2 reps;30 seconds;Limitations    Hip Flexor Stretch Limitations modified Thomas stretch on edge of mat 2 x 30 sec each side.   verbally added to HEP     Knee/Hip Exercises: Aerobic   Other Aerobic SciFit Level 3.5 for 8 minutes with UE/LE's with goal >/= 80 steps per minute for strengthening and activity tolerance.               PT Education - 11/07/21 0811     Education Details Verbally added modified Thomas stretch to [employer].    Person(s) Educated Patient    Methods Explanation;Demonstration;Verbal cues    Comprehension Verbalized understanding;Returned demonstration              PT Short Term Goals - 11/07/21 0730       PT SHORT TERM GOAL #1   Title Pt will be independent with HEP for strengthening and balance to continue gains at home.    Baseline HEP given on 10/24/2021    Time --    Period --    Status Achieved    Target Date --      PT SHORT TERM GOAL #2   Title Pt will decrease 5 x sit to stand from 12.47 sec to < 11 sec for improved balance and functional mobility.    Baseline 11/07/2021 9.94 sec with UE assist from standard chair    Time --    Period --    Status Achieved    Target Date --      PT SHORT TERM GOAL #3   Title Pt will ambulate >1000' on varied level surfaces with cane for improved community mobility.    Baseline 40' with cane supervision on eval; 11/07/2021 defered due to weather    Time --    Period --    Status Deferred    Target Date --               PT Long Term Goals - 10/17/21 1112       PT LONG TERM GOAL #1   Title Pt will increase Berg score from 34 to >40/56 for improved balance and functional mobility.    Baseline 10/17/21 34/56    Time 8    Period Weeks    Status New    Target Date  12/13/21      PT LONG TERM GOAL #2   Title Pt will  increase gait speed to >1.06 m/s for improved community ambulator speed.    Baseline 10/17/21 0.89ms    Time 8    Period Weeks    Status New    Target Date 12/13/21      PT LONG TERM GOAL #3   Title Pt will be able to perform sit to stand x 3 from chair without hands for improved functional strength.    Baseline 10/17/21 needs hands to rise    Time 8    Period Weeks    Status New    Target Date 12/13/21      PT LONG TERM GOAL #4   Title Pt will ambulate up/down 4 steps with 1 rail and cane, up/down ramp with cane and up/down curb with cane mod I for improved community access.    Baseline steps not assessed at eval    Time 8    Period Weeks    Status New    Target Date 12/13/21                   Plan - 11/07/21 07062    Clinical Impression Statement Today's skilled session focused on checking STGs, as well continued LE strengthening/stretching. The pt has met 2/3 STGs, and should continue to benefit from further skilled PT to address functional deficits.    Personal Factors and Comorbidities Comorbidity 3+    Comorbidities Type 2 DM, osteomyelitis, Charcot foot, abnormal EKG.    Examination-Activity Limitations Locomotion Level;Transfers;Stairs;Squat;Stand    Examination-Participation Restrictions CNurse, adultYard Work    SMerchant navy officerEvolving/Moderate complexity    Rehab Potential Good    PT Frequency 2x / week    PT Duration 8 weeks    PT Treatment/Interventions ADLs/Self Care Home Management;DME Instruction;Gait training;Stair training;Functional mobility training;Therapeutic activities;Therapeutic exercise;Balance training;Neuromuscular re-education;Manual techniques;Prosthetic Training;Passive range of motion;Patient/family education;Vestibular    PT Next Visit Plan Assess stairs. LE and core strengthening.Balance training. Gait training with cane and without device.     PT Home Exercise Plan [email or web address]   Consulted and Agree with Plan of Care Patient             Patient will benefit from skilled therapeutic intervention in order to improve the following deficits and impairments:  Abnormal gait, Decreased mobility, Decreased strength, Decreased balance, Decreased knowledge of use of DME, Decreased range of motion, Impaired sensation, Prosthetic Dependency  Visit Diagnosis: Other abnormalities of gait and mobility  Muscle weakness (generalized)  Abnormal posture  Unsteadiness on feet     Problem List Patient Active Problem List   Diagnosis Date Noted   Dyslipidemia 10/18/2020   Vitreous hemorrhage of right eye due to diabetes mellitus ([place] 02/01/2019   Preop cardiovascular exam 10/22/2018   Chest pain 10/22/2018   Vitreous hemorrhage, right eye ([place] 09/21/2018   Proliferative diabetic retinopathy with macular edema ([place] 09/21/2018   Non-pressure chronic ulcer of left calf, limited to breakdown of skin ([place] 11/12/2016   Status post bilateral below knee amputation ([place] 11/12/2016   Normocytic anemia 10/04/2016   Osteomyelitis ([place] 10/04/2016   Finger infection 10/04/2016   Osteomyelitis of finger ([place] 10/04/2016   Gastroparesis 11/02/2014   Diabetes mellitus due to underlying condition with other specified complication ([place]    Diabetic foot ([facility])    Nausea & vomiting    Essential hypertension 10/25/2014   GERD (gastroesophageal reflux disease) 10/25/2014   Cellulitis 10/24/2014   Diabetes ([place] 10/24/2014   Insulin dependent diabetes mellitus ([place] 10/24/2014  Rondel Baton, SPTA 11/07/2021, 8:18 AM  [facility] [address] [place], [place], [address] Phone: [phone]   Fax:  [phone]  Name: Diana Coleman MRN: [id number] Date of Birth: 1951/11/21

## 2021-11-12 ENCOUNTER — Encounter: Payer: Medicare Other | Admitting: Physical Therapy

## 2021-11-19 ENCOUNTER — Ambulatory Visit: Payer: Medicare Other | Admitting: Physical Therapy

## 2021-11-21 ENCOUNTER — Ambulatory Visit: Payer: Medicare Other | Admitting: Rehabilitation

## 2021-11-21 ENCOUNTER — Other Ambulatory Visit: Payer: Self-pay

## 2021-11-21 ENCOUNTER — Ambulatory Visit: Payer: Medicare Other | Attending: General Practice | Admitting: Physical Therapy

## 2021-11-21 ENCOUNTER — Encounter: Payer: Self-pay | Admitting: Physical Therapy

## 2021-11-21 DIAGNOSIS — R293 Abnormal posture: Secondary | ICD-10-CM | POA: Diagnosis present

## 2021-11-21 DIAGNOSIS — R2689 Other abnormalities of gait and mobility: Secondary | ICD-10-CM | POA: Insufficient documentation

## 2021-11-21 DIAGNOSIS — R2681 Unsteadiness on feet: Secondary | ICD-10-CM | POA: Diagnosis present

## 2021-11-21 DIAGNOSIS — M6281 Muscle weakness (generalized): Secondary | ICD-10-CM | POA: Diagnosis present

## 2021-11-21 NOTE — Therapy (Signed)
[facility] [address] [place], [place], [address] Phone: [phone]   Fax:  [phone]  Physical Therapy Treatment  Patient Details  Name: Diana Coleman MRN: [id number] Date of Birth: 11-06-1951 Referring Provider (PT): Keith Rake   Encounter Date: 11/21/2021   PT End of Session - 11/21/21 1152     Visit Number 7    Number of Visits 17    Date for PT Re-Evaluation 12/13/21    Authorization Type UHC medicare so 10th visit progress note    Progress Note Due on Visit 10    PT Start Time 0800    PT Stop Time 0841    PT Time Calculation (min) 41 min    Equipment Utilized During Treatment Gait belt    Activity Tolerance Patient tolerated treatment well    Behavior During Therapy [facility] for tasks assessed/performed             Past Medical History:  Diagnosis Date   Cellulitis of foot, left 10/24/2014   hx/notes 10/24/2014   Charcot foot due to diabetes mellitus ([place])    Hepatitis    Hep B    Hypertension    Osteomyelitis of right foot ([place])    hx/notes 10/24/2014   Proliferative diabetic retinopathy ([place])    right eye and vitreous hemorrhage   Type II diabetes mellitus ([place])    Since 1998    Past Surgical History:  Procedure Laterality Date   ABOVE KNEE LEG AMPUTATION Right 2004   AMPUTATION Left 10/29/2014   Procedure: AMPUTATION BELOW KNEE - LEFT;  Surgeon: Newt Minion, MD;  Location: [facility];  Service: Orthopedics;  Laterality: Left;   CATARACT EXTRACTION W/ INTRAOCULAR LENS  IMPLANT, BILATERAL     FOOT SURGERY Left 1980's   "ulcer removed"   I & D EXTREMITY Right 10/04/2016   Procedure: IRRIGATION AND DEBRIDEMENT MIDDLE FINGER AND AMPUTATION;  Surgeon: Iran Planas, MD;  Location: [facility];  Service: Orthopedics;  Laterality: Right;   MEMBRANE PEEL Right 02/01/2019   Procedure: Carvel Getting;  Surgeon: Hayden Pedro, MD;  Location: [facility];  Service: Ophthalmology;  Laterality: Right;   PARS PLANA  VITRECTOMY 27 GAUGE Right 02/01/2019   PARS PLANA VITRECTOMY 27 GAUGE Right 02/01/2019   Procedure: PARS PLANA VITRECTOMY 27 GAUGE, MEMBRANE PEEL, ENDOLASER, GAS INJECTION;  Surgeon: Hayden Pedro, MD;  Location: [facility];  Service: Ophthalmology;  Laterality: Right;   REDUCTION MAMMAPLASTY Bilateral 1987   TOE AMPUTATION Left ~ 2011   "top of my 3rd toe"   TUBAL LIGATION  1973    There were no vitals filed for this visit.   Subjective Assessment - 11/21/21 0801     Subjective No new complaints, falls, or pain today. "my trip out of town went well. But I haven't down any exercises since I was here."    Pertinent History Type 2 DM, osteomyelitis, Charcot foot, abnormal EKG.    Patient Stated Goals Pt would like to strengthen her legs and be able to get up from chair easier.    Currently in Pain? No/denies                 [facility] Adult PT Treatment/Exercise - 11/21/21 0802       Transfers   Transfers Sit to Stand;Stand to Sit    Sit to Stand 5: Supervision;With upper extremity assist;From bed;From chair/3-in-1    Stand to Sit 5: Supervision;With upper extremity assist;To bed;To chair/3-in-1  Ambulation/Gait   Ambulation/Gait Yes    Ambulation/Gait Assistance 5: Supervision    Ambulation/Gait Assistance Details Pt ambulated in/out of session with straight cane/quad tip, and throughout session with no device.    Ambulation Distance (Feet) --   Throughout session   Assistive device Straight cane;None;Prostheses    Gait Pattern Step-through pattern;Lateral trunk lean to right;Lateral trunk lean to left;Wide base of support;Decreased stance time - left    Ambulation Surface Level;Indoor    Ramp 5: Supervision    Ramp Details (indicate cue type and reason) The pt ambulated up/down ramp x 2 with straight cane/quad tip. The pt demonstrated intermittend increased gait speed when ascending/descending.    Curb 5: Supervision    Curb Details (indicate cue type and reason) The pt  oerformed stepping up/down curb x2. The pt demonstrated stepping down the R LE while piviting on the L LE to descend the curb.      Neuro Re-ed    Neuro Re-ed Details  for strengthening/muscle re-ed in // bars: step downs and lateral step ups from 4" step. Pt performed 10 x each side for step downs and lateral step ups. The pt required min guard, B UE assist, and verbal/tactile cues to use quads to control ascent/descent.      Knee/Hip Exercises: Aerobic   Other Aerobic SciFit Level 3.5 for 8 minutes with UE/LE's with goal >/= 80 steps per minute for strengthening and activity tolerance. After ~3 min, the pt c/o pain soreness in L shoulder. Pt completed remaining 5 min with LE's only.      Knee/Hip Exercises: Supine   Writer;Both    Bridges Limitations 10 x with 5 sec hold with red theraball under LE's. The pt required min assist to maintain LE's on ball.    Other Supine Knee/Hip Exercises 10 x B hamstring curls with red theraball under LE's. The pt required min assist maintaining LE's on ball.      Knee/Hip Exercises: Sidelying   Clams clams 10 x each side. The pt required verbal/tactile cues for technique                PT Short Term Goals - 11/07/21 0730       PT SHORT TERM GOAL #1   Title Pt will be independent with HEP for strengthening and balance to continue gains at home.    Baseline HEP given on 10/24/2021    Time --    Period --    Status Achieved    Target Date --      PT SHORT TERM GOAL #2   Title Pt will decrease 5 x sit to stand from 12.47 sec to < 11 sec for improved balance and functional mobility.    Baseline 11/07/2021 9.94 sec with UE assist from standard chair    Time --    Period --    Status Achieved    Target Date --      PT SHORT TERM GOAL #3   Title Pt will ambulate >1000' on varied level surfaces with cane for improved community mobility.    Baseline 31' with cane supervision on eval; 11/07/2021 defered due to weather    Time --     Period --    Status Deferred    Target Date --               PT Long Term Goals - 10/17/21 1112       PT LONG TERM GOAL #1   Title  Pt will increase Berg score from 34 to >40/56 for improved balance and functional mobility.    Baseline 10/17/21 34/56    Time 8    Period Weeks    Status New    Target Date 12/13/21      PT LONG TERM GOAL #2   Title Pt will increase gait speed to >1.06 m/s for improved community ambulator speed.    Baseline 10/17/21 0.70m/s    Time 8    Period Weeks    Status New    Target Date 12/13/21      PT LONG TERM GOAL #3   Title Pt will be able to perform sit to stand x 3 from chair without hands for improved functional strength.    Baseline 10/17/21 needs hands to rise    Time 8    Period Weeks    Status New    Target Date 12/13/21      PT LONG TERM GOAL #4   Title Pt will ambulate up/down 4 steps with 1 rail and cane, up/down ramp with cane and up/down curb with cane mod I for improved community access.    Baseline steps not assessed at eval    Time 8    Period Weeks    Status New    Target Date 12/13/21               Plan - 11/21/21 1153     Clinical Impression Statement Today's skilled session was focused on performing exercises for LE strengthening and activity tolerance. The pt performed all interventions with no noted issues and rest breaks as needed. The pt should continue to benefit from further skilled PT to address functional deficits.    Personal Factors and Comorbidities Comorbidity 3+    Comorbidities Type 2 DM, osteomyelitis, Charcot foot, abnormal EKG.    Examination-Activity Limitations Locomotion Level;Transfers;Stairs;Squat;Stand    Examination-Participation Restrictions Nurse, adult;Yard Work    Merchant navy officer Evolving/Moderate complexity    Rehab Potential Good    PT Frequency 2x / week    PT Duration 8 weeks    PT Treatment/Interventions ADLs/Self Care Home  Management;DME Instruction;Gait training;Stair training;Functional mobility training;Therapeutic activities;Therapeutic exercise;Balance training;Neuromuscular re-education;Manual techniques;Prosthetic Training;Passive range of motion;Patient/family education;Vestibular    PT Next Visit Plan Assess stairs. LE and core strengthening.Balance training. Gait training with cane and without device. Improving gait distance/endurance.    PT Home Exercise Plan [email or web address]    Consulted and Agree with Plan of Care Patient             Patient will benefit from skilled therapeutic intervention in order to improve the following deficits and impairments:  Abnormal gait, Decreased mobility, Decreased strength, Decreased balance, Decreased knowledge of use of DME, Decreased range of motion, Impaired sensation, Prosthetic Dependency  Visit Diagnosis: Other abnormalities of gait and mobility  Muscle weakness (generalized)  Abnormal posture  Unsteadiness on feet     Problem List Patient Active Problem List   Diagnosis Date Noted   Dyslipidemia 10/18/2020   Vitreous hemorrhage of right eye due to diabetes mellitus ([place]) 02/01/2019   Preop cardiovascular exam 10/22/2018   Chest pain 10/22/2018   Vitreous hemorrhage, right eye ([place]) 09/21/2018   Proliferative diabetic retinopathy with macular edema ([place]) 09/21/2018   Non-pressure chronic ulcer of left calf, limited to breakdown of skin ([place]) 11/12/2016   Status post bilateral below knee amputation ([place]) 11/12/2016   Normocytic anemia 10/04/2016   Osteomyelitis ([place]) 10/04/2016   Finger infection 10/04/2016  Osteomyelitis of finger ([place]) 10/04/2016   Gastroparesis 11/02/2014   Diabetes mellitus due to underlying condition with other specified complication ([place])    Diabetic foot ([place])    Nausea & vomiting    Essential hypertension 10/25/2014   GERD (gastroesophageal reflux disease) 10/25/2014   Cellulitis 10/24/2014   Diabetes ([place]) 10/24/2014    Insulin dependent diabetes mellitus ([place]) 10/24/2014    Rondel Baton, SPTA 11/21/2021, 11:58 AM  [facility] [address] [place], [place], [address] Phone: [phone]   Fax:  [phone]  Name: Diana Coleman MRN: [id number] Date of Birth: [birth year]

## 2021-11-26 ENCOUNTER — Ambulatory Visit: Payer: Medicare Other | Admitting: Physical Therapy

## 2021-11-28 ENCOUNTER — Ambulatory Visit: Payer: Medicare Other | Admitting: Physical Therapy

## 2021-12-03 ENCOUNTER — Encounter: Payer: Self-pay | Admitting: Physical Therapy

## 2021-12-03 ENCOUNTER — Other Ambulatory Visit: Payer: Self-pay

## 2021-12-03 ENCOUNTER — Ambulatory Visit: Payer: Medicare Other | Admitting: Physical Therapy

## 2021-12-03 DIAGNOSIS — M6281 Muscle weakness (generalized): Secondary | ICD-10-CM

## 2021-12-03 DIAGNOSIS — R293 Abnormal posture: Secondary | ICD-10-CM

## 2021-12-03 DIAGNOSIS — R2689 Other abnormalities of gait and mobility: Secondary | ICD-10-CM | POA: Diagnosis not present

## 2021-12-03 DIAGNOSIS — R2681 Unsteadiness on feet: Secondary | ICD-10-CM

## 2021-12-03 NOTE — Therapy (Signed)
[facility] [address] [place], [place], [address] Phone: [phone]   Fax:  [phone]  Physical Therapy Treatment  Patient Details  Name: Diana Coleman MRN: [id number] Date of Birth: 06/21/1951 Referring Provider (PT): Keith Rake   Encounter Date: 12/03/2021   PT End of Session - 12/03/21 0725     Visit Number 8    Number of Visits 17    Date for PT Re-Evaluation 12/13/21    Authorization Type UHC medicare so 10th visit progress note    Progress Note Due on Visit 10    PT Start Time 0720    PT Stop Time 0759    PT Time Calculation (min) 39 min    Equipment Utilized During Treatment Gait belt    Activity Tolerance Patient tolerated treatment well    Behavior During Therapy [facility] for tasks assessed/performed             Past Medical History:  Diagnosis Date   Cellulitis of foot, left 10/24/2014   hx/notes 10/24/2014   Charcot foot due to diabetes mellitus ([place])    Hepatitis    Hep B    Hypertension    Osteomyelitis of right foot ([place])    hx/notes 10/24/2014   Proliferative diabetic retinopathy ([place])    right eye and vitreous hemorrhage   Type II diabetes mellitus ([place])    Since 1998    Past Surgical History:  Procedure Laterality Date   ABOVE KNEE LEG AMPUTATION Right 2004   AMPUTATION Left 10/29/2014   Procedure: AMPUTATION BELOW KNEE - LEFT;  Surgeon: Newt Minion, MD;  Location: [facility];  Service: Orthopedics;  Laterality: Left;   CATARACT EXTRACTION W/ INTRAOCULAR LENS  IMPLANT, BILATERAL     FOOT SURGERY Left 1980's   "ulcer removed"   I & D EXTREMITY Right 10/04/2016   Procedure: IRRIGATION AND DEBRIDEMENT MIDDLE FINGER AND AMPUTATION;  Surgeon: Iran Planas, MD;  Location: [facility];  Service: Orthopedics;  Laterality: Right;   MEMBRANE PEEL Right 02/01/2019   Procedure: Carvel Getting;  Surgeon: Hayden Pedro, MD;  Location: [facility];  Service: Ophthalmology;  Laterality: Right;   PARS PLANA  VITRECTOMY 27 GAUGE Right 02/01/2019   PARS PLANA VITRECTOMY 27 GAUGE Right 02/01/2019   Procedure: PARS PLANA VITRECTOMY 27 GAUGE, MEMBRANE PEEL, ENDOLASER, GAS INJECTION;  Surgeon: Hayden Pedro, MD;  Location: [facility];  Service: Ophthalmology;  Laterality: Right;   REDUCTION MAMMAPLASTY Bilateral 1987   TOE AMPUTATION Left ~ 2011   "top of my 3rd toe"   TUBAL LIGATION  1973    There were no vitals filed for this visit.   Subjective Assessment - 12/03/21 0724     Subjective Reports having a place on her left limb. No falls.    Pertinent History Type 2 DM, osteomyelitis, Charcot foot, abnormal EKG.    Patient Stated Goals Pt would like to strengthen her legs and be able to get up from chair easier.    Currently in Pain? No/denies                     [facility] Adult PT Treatment/Exercise - 12/03/21 0726       Transfers   Transfers Sit to Stand;Stand to Sit    Sit to Stand 5: Supervision;With upper extremity assist;From bed;From chair/3-in-1    Stand to Sit 5: Supervision;With upper extremity assist;To bed;To chair/3-in-1      Ambulation/Gait   Ambulation/Gait Yes  Ambulation/Gait Assistance 5: Supervision    Ambulation Distance (Feet) --   around clinic with session   Assistive device Straight cane;None;Prostheses    Gait Pattern Step-through pattern;Lateral trunk lean to right;Lateral trunk lean to left;Wide base of support;Decreased stance time - left    Ambulation Surface Level;Indoor      Self-Care   Self-Care Other Self-Care Comments    Other Self-Care Comments  pt reporting feeling her blood sugar is low. provided with coke/soda to sip on while looking an wound on residual limb with pt reporting feeling better.      Neuro Re-ed    Neuro Re-ed Details  for strengthening/muscle re-ed: seated at edge of mat table in staggered stance- sit<>stand x 10 reps each foot forward with UE support on mat and use of chair for balance assist in stance; in standing next to mat  table with chair in front- wide staggered stance for EC 30 sec's x 2 reps each foot forward min guard to min assist for balance. cues on posture to assist with balance.      Knee/Hip Exercises: Aerobic   Other Aerobic SciFit Level 3.5 for 8 minutes with LE's with goal >/= 80 steps per minute for strengthening and activity tolerance.      Prosthetics   Prosthetic Care Comments  pt reports having sweat in liner/prostheses when she takes them off. Discussed sweat management and signs of sweating. Discussed keeping small hand towel/wash cloth on her to be able to dry limb/liner as needed through out day to prevent futher blisters from occuring.    Current prosthetic wear tolerance (days/week)  daily    Current prosthetic wear tolerance (#hours/day)  all awake hours    Residual limb condition  bruising on left knee from crawling. new open blister on distal end of left limb.    Education Provided Residual limb care;Other (comment)   see comments above   Person(s) Educated Patient    Education Method Explanation;Demonstration;Verbal cues    Education Method Verbalized understanding;Returned demonstration;Verbal cues required;Needs further instruction                       PT Short Term Goals - 11/07/21 0730       PT SHORT TERM GOAL #1   Title Pt will be independent with HEP for strengthening and balance to continue gains at home.    Baseline HEP given on 10/24/2021    Time --    Period --    Status Achieved    Target Date --      PT SHORT TERM GOAL #2   Title Pt will decrease 5 x sit to stand from 12.47 sec to < 11 sec for improved balance and functional mobility.    Baseline 11/07/2021 9.94 sec with UE assist from standard chair    Time --    Period --    Status Achieved    Target Date --      PT SHORT TERM GOAL #3   Title Pt will ambulate >1000' on varied level surfaces with cane for improved community mobility.    Baseline 18' with cane supervision on eval; 11/07/2021  defered due to weather    Time --    Period --    Status Deferred    Target Date --               PT Long Term Goals - 10/17/21 1112       PT LONG TERM GOAL #  1   Title Pt will increase Berg score from 34 to >40/56 for improved balance and functional mobility.    Baseline 10/17/21 34/56    Time 8    Period Weeks    Status New    Target Date 12/13/21      PT LONG TERM GOAL #2   Title Pt will increase gait speed to >1.06 m/s for improved community ambulator speed.    Baseline 10/17/21 0.33m/s    Time 8    Period Weeks    Status New    Target Date 12/13/21      PT LONG TERM GOAL #3   Title Pt will be able to perform sit to stand x 3 from chair without hands for improved functional strength.    Baseline 10/17/21 needs hands to rise    Time 8    Period Weeks    Status New    Target Date 12/13/21      PT LONG TERM GOAL #4   Title Pt will ambulate up/down 4 steps with 1 rail and cane, up/down ramp with cane and up/down curb with cane mod I for improved community access.    Baseline steps not assessed at eval    Time 8    Period Weeks    Status New    Target Date 12/13/21                   Plan - 12/03/21 0725     Clinical Impression Statement Today's skilled session initially focused on wound care management due to new open blister on distal end of left residual limb. Remainder of session continued to focus on strengthening and balance with limited standing/gait due to increased tenderness of left limb from blister. No issues noted or reported in session other than the tenderness of limb and feeling of low blood sugar at start of session which was remedied with soda. The pt is making progress toward goals and should benefit from continued PT to progress toward unmet goals.    Personal Factors and Comorbidities Comorbidity 3+    Comorbidities Type 2 DM, osteomyelitis, Charcot foot, abnormal EKG.    Examination-Activity Limitations Locomotion  Level;Transfers;Stairs;Squat;Stand    Examination-Participation Restrictions Nurse, adult;Yard Work    [employer]chant [employer] officer Evolving/Moderate complexity    Rehab Potential Good    PT Frequency 2x / week    PT Duration 8 weeks    PT Treatment/Interventions ADLs/Self Care Home Management;DME Instruction;Gait training;Stair training;Functional mobility training;Therapeutic activities;Therapeutic exercise;Balance training;Neuromuscular re-education;Manual techniques;Prosthetic Training;Passive range of motion;Patient/family education;Vestibular    PT Next Visit Plan how is wound healing? continue to work on LE strengthening, balance with decreased UE support and gait/dynamic gait with prostheses only    PT Home Exercise Plan [email or web address]    Consulted and Agree with Plan of Care Patient             Patient will benefit from skilled therapeutic intervention in order to improve the following deficits and impairments:  Abnormal gait, Decreased mobility, Decreased strength, Decreased balance, Decreased knowledge of use of DME, Decreased range of motion, Impaired sensation, Prosthetic Dependency  Visit Diagnosis: Other abnormalities of gait and mobility  Muscle weakness (generalized)  Abnormal posture  Unsteadiness on feet     Problem List Patient Active Problem List   Diagnosis Date Noted   Dyslipidemia 10/18/2020   Vitreous hemorrhage of right eye due to diabetes mellitus ([place]) 02/01/2019   Preop cardiovascular exam 10/22/2018   Chest pain 10/22/2018  Vitreous hemorrhage, right eye ([place]) 09/21/2018   Proliferative diabetic retinopathy with macular edema ([place]) 09/21/2018   Non-pressure chronic ulcer of left calf, limited to breakdown of skin ([place]) 11/12/2016   Status post bilateral below knee amputation ([place]) 11/12/2016   Normocytic anemia 10/04/2016   Osteomyelitis ([place]) 10/04/2016   Finger infection 10/04/2016   Osteomyelitis of finger ([place])  10/04/2016   Gastroparesis 11/02/2014   Diabetes mellitus due to underlying condition with other specified complication ([place])    Diabetic foot ([facility])    Nausea & vomiting    Essential hypertension 10/25/2014   GERD (gastroesophageal reflux disease) 10/25/2014   Cellulitis 10/24/2014   Diabetes ([place]) 10/24/2014   Insulin dependent diabetes mellitus ([place]) 10/24/2014    Willow Ora, PTA, [facility] Neuro [facility] [address] [place], [place] [address] [phone] 12/03/21, 8:58 AM   Name: Diana Coleman MRN: [id number] Date of Birth: [birth year]

## 2021-12-05 ENCOUNTER — Other Ambulatory Visit: Payer: Self-pay

## 2021-12-05 ENCOUNTER — Encounter: Payer: Self-pay | Admitting: Physical Therapy

## 2021-12-05 ENCOUNTER — Ambulatory Visit: Payer: Medicare Other | Admitting: Physical Therapy

## 2021-12-05 DIAGNOSIS — R2681 Unsteadiness on feet: Secondary | ICD-10-CM

## 2021-12-05 DIAGNOSIS — M6281 Muscle weakness (generalized): Secondary | ICD-10-CM

## 2021-12-05 DIAGNOSIS — R2689 Other abnormalities of gait and mobility: Secondary | ICD-10-CM | POA: Diagnosis not present

## 2021-12-05 DIAGNOSIS — R293 Abnormal posture: Secondary | ICD-10-CM

## 2021-12-05 NOTE — Therapy (Signed)
[facility] [address] [place], [place], [address] Phone: [phone]   Fax:  [phone]  Physical Therapy Treatment  Patient Details  Name: Diana Coleman MRN: [id number] Date of Birth: December 21, 1951 Referring Provider (PT): Keith Rake   Encounter Date: 12/05/2021   PT End of Session - 12/05/21 0722     Visit Number 9    Number of Visits 17    Date for PT Re-Evaluation 12/13/21    Authorization Type UHC medicare so 10th visit progress note    Progress Note Due on Visit 10    PT Start Time 0718    PT Stop Time 0800    PT Time Calculation (min) 42 min    Equipment Utilized During Treatment Gait belt    Activity Tolerance Patient tolerated treatment well    Behavior During Therapy [facility] for tasks assessed/performed             Past Medical History:  Diagnosis Date   Cellulitis of foot, left 10/24/2014   hx/notes 10/24/2014   Charcot foot due to diabetes mellitus ([place])    Hepatitis    Hep B    Hypertension    Osteomyelitis of right foot ([place])    hx/notes 10/24/2014   Proliferative diabetic retinopathy ([place])    right eye and vitreous hemorrhage   Type II diabetes mellitus ([place])    Since 1998    Past Surgical History:  Procedure Laterality Date   ABOVE KNEE LEG AMPUTATION Right 2004   AMPUTATION Left 10/29/2014   Procedure: AMPUTATION BELOW KNEE - LEFT;  Surgeon: Newt Minion, MD;  Location: [facility];  Service: Orthopedics;  Laterality: Left;   CATARACT EXTRACTION W/ INTRAOCULAR LENS  IMPLANT, BILATERAL     FOOT SURGERY Left 1980's   "ulcer removed"   I & D EXTREMITY Right 10/04/2016   Procedure: IRRIGATION AND DEBRIDEMENT MIDDLE FINGER AND AMPUTATION;  Surgeon: Iran Planas, MD;  Location: [facility];  Service: Orthopedics;  Laterality: Right;   MEMBRANE PEEL Right 02/01/2019   Procedure: Carvel Getting;  Surgeon: Hayden Pedro, MD;  Location: [facility];  Service: Ophthalmology;  Laterality: Right;   PARS PLANA  VITRECTOMY 27 GAUGE Right 02/01/2019   PARS PLANA VITRECTOMY 27 GAUGE Right 02/01/2019   Procedure: PARS PLANA VITRECTOMY 27 GAUGE, MEMBRANE PEEL, ENDOLASER, GAS INJECTION;  Surgeon: Hayden Pedro, MD;  Location: [facility];  Service: Ophthalmology;  Laterality: Right;   REDUCTION MAMMAPLASTY Bilateral 1987   TOE AMPUTATION Left ~ 2011   "top of my 3rd toe"   TUBAL LIGATION  1973    There were no vitals filed for this visit.   Subjective Assessment - 12/05/21 0721     Subjective No new complaints.No falls. Blister on left limb is getting better, not as tender to touch.    Pertinent History Type 2 DM, osteomyelitis, Charcot foot, abnormal EKG.    Patient Stated Goals Pt would like to strengthen her legs and be able to get up from chair easier.    Currently in Pain? No/denies                      [facility] Adult PT Treatment/Exercise - 12/05/21 0723       Transfers   Transfers Sit to Stand;Stand to Sit    Sit to Stand 5: Supervision;With upper extremity assist;From bed;From chair/3-in-1    Stand to Sit 5: Supervision;With upper extremity assist;To bed;To chair/3-in-1  Ambulation/Gait   Ambulation/Gait Yes    Ambulation/Gait Assistance 5: Supervision    Assistive device Straight cane;None;Prostheses    Gait Pattern Step-through pattern;Lateral trunk lean to right;Lateral trunk lean to left;Wide base of support;Decreased stance time - left    Ambulation Surface Level;Indoor    Stairs Yes    Stairs Assistance 4: Min guard;4: Min assist    Stairs Assistance Details (indicate cue type and reason) 1st rep: step to pattern to ascend and descend with cues on weight shifitng. 2cd rep: reciprocal pattern to acscend with step to pattern to descend. 3rd rep- after PTA demo had pt work on reciprocal pattern to both ascend and descend with cues on weight shifting and prosthetic foot placement with descending (take foot closer to edge for toes to be off edge). min guard assist for safety  with stair negotiation.    Stair Management Technique Two rails;Step to pattern;Alternating pattern;Forwards    Number of Stairs 4   x3 reps   Height of Stairs 6      High Level Balance   High Level Balance Activities Side stepping;Marching forwards;Marching backwards    High Level Balance Comments blue mat in parallel bars: with light support on bars for 3 laps each with min guard assist for safety. cues on posture and ex form/technique.      Neuro Re-ed    Neuro Re-ed Details  for strenthening/muscle re-ed: gait around track with prostheses only working on scanning all directions randomly with min guard to min assist, minor veering at times.      Knee/Hip Exercises: Aerobic   Other Aerobic SciFit Level 3.5 for 8 minutes with LE's with goal >/= 80 steps per minute for strengthening and activity tolerance.      Prosthetics   Current prosthetic wear tolerance (days/week)  daily    Current prosthetic wear tolerance (#hours/day)  all awake hours    Residual limb condition  pt reports blister looks better, not as tender to touch. Has Tegaderm over it currently, therefore did not remove liner/prosthesis.                 Balance Exercises - 12/05/21 0755       Balance Exercises: Standing   Standing Eyes Closed Wide (BOA);Head turns;Foam/compliant surface;Other reps (comment);30 secs;Limitations    Standing Eyes Closed Limitations on airex: no UE support for EC 30 sec's x 3 reps, progressing to light UE support for EC head movements left<>right, up<>down for 9-10 reps each. min guard assist for balance.                  PT Short Term Goals - 11/07/21 0730       PT SHORT TERM GOAL #1   Title Pt will be independent with HEP for strengthening and balance to continue gains at home.    Baseline HEP given on 10/24/2021    Time --    Period --    Status Achieved    Target Date --      PT SHORT TERM GOAL #2   Title Pt will decrease 5 x sit to stand from 12.47 sec to < 11  sec for improved balance and functional mobility.    Baseline 11/07/2021 9.94 sec with UE assist from standard chair    Time --    Period --    Status Achieved    Target Date --      PT SHORT TERM GOAL #3   Title Pt will ambulate >1000' on  varied level surfaces with cane for improved community mobility.    Baseline 29' with cane supervision on eval; 11/07/2021 defered due to weather    Time --    Period --    Status Deferred    Target Date --               PT Long Term Goals - 10/17/21 1112       PT LONG TERM GOAL #1   Title Pt will increase Berg score from 34 to >40/56 for improved balance and functional mobility.    Baseline 10/17/21 34/56    Time 8    Period Weeks    Status New    Target Date 12/13/21      PT LONG TERM GOAL #2   Title Pt will increase gait speed to >1.06 m/s for improved community ambulator speed.    Baseline 10/17/21 0.86m/s    Time 8    Period Weeks    Status New    Target Date 12/13/21      PT LONG TERM GOAL #3   Title Pt will be able to perform sit to stand x 3 from chair without hands for improved functional strength.    Baseline 10/17/21 needs hands to rise    Time 8    Period Weeks    Status New    Target Date 12/13/21      PT LONG TERM GOAL #4   Title Pt will ambulate up/down 4 steps with 1 rail and cane, up/down ramp with cane and up/down curb with cane mod I for improved community access.    Baseline steps not assessed at eval    Time 8    Period Weeks    Status New    Target Date 12/13/21                   Plan - 12/05/21 4481     Clinical Impression Statement Today's skilled session continued to focus on strengthening, dyanamic gait and balance training with rest breaks taken as needed. Also began stair training this session with cues needed, min guard assist. The pt is making steady progress toward goals and should benefit from continued PT to progress toward unmet goals.    Personal Factors and Comorbidities  Comorbidity 3+    Comorbidities Type 2 DM, osteomyelitis, Charcot foot, abnormal EKG.    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 how is wound healing? continue to work on LE strengthening, balance with decreased UE support and gait/dynamic gait with prostheses only. continue with stair training.    PT Home Exercise Plan [id number]    Consulted and Agree with Plan of Care Patient             Patient will benefit from skilled therapeutic intervention in order to improve the following deficits and impairments:  Abnormal gait, Decreased mobility, Decreased strength, Decreased balance, Decreased knowledge of use of DME, Decreased range of motion, Impaired sensation, Prosthetic Dependency  Visit Diagnosis: Other abnormalities of gait and mobility  Muscle weakness (generalized)  Abnormal posture  Unsteadiness on feet     Problem List Patient Active Problem List   Diagnosis Date Noted   Dyslipidemia 10/18/2020   Vitreous hemorrhage of right eye due to diabetes mellitus ([place]) 02/01/2019   Preop cardiovascular exam 10/22/2018   Chest pain 10/22/2018   Vitreous hemorrhage, right eye ([place]) 09/21/2018   Proliferative diabetic retinopathy with macular edema ([place]) 09/21/2018   Non-pressure chronic ulcer of left calf, limited to breakdown of skin ([place]) 11/12/2016    Status post bilateral below knee amputation ([place]) 11/12/2016   Normocytic anemia 10/04/2016   Osteomyelitis ([place]) 10/04/2016   Finger infection 10/04/2016   Osteomyelitis of finger ([place]) 10/04/2016   Gastroparesis 11/02/2014   Diabetes mellitus due to underlying condition with other specified complication ([place])    Diabetic foot (H[facility])    Nausea & vomiting    Essential hypertension 10/25/2014   GERD (gastroesophageal reflux disease) 10/25/2014   Cellulitis 10/24/2014   Diabetes ([place]) 10/24/2014   Insulin dependent diabetes mellitus ([place]) 10/24/2014    Willow Ora, PTA, [facility] Outpatient Neuro [facility] [address] [place], [place] [address] [phone] 12/05/21, 8:21 AM   Name: Diana Coleman MRN: [id number] Date of Birth: 14-Aug-1951

## 2021-12-09 ENCOUNTER — Ambulatory Visit: Payer: Medicare Other

## 2021-12-12 ENCOUNTER — Other Ambulatory Visit: Payer: Self-pay

## 2021-12-12 ENCOUNTER — Ambulatory Visit: Payer: Medicare Other

## 2021-12-12 DIAGNOSIS — M6281 Muscle weakness (generalized): Secondary | ICD-10-CM

## 2021-12-12 DIAGNOSIS — R2689 Other abnormalities of gait and mobility: Secondary | ICD-10-CM | POA: Diagnosis not present

## 2021-12-12 DIAGNOSIS — R293 Abnormal posture: Secondary | ICD-10-CM

## 2021-12-12 DIAGNOSIS — R2681 Unsteadiness on feet: Secondary | ICD-10-CM

## 2021-12-12 NOTE — Therapy (Signed)
[facility] [address] [place], [place], [address] Phone: [phone]   Fax:  [phone]  Physical Therapy Treatment/ Recert/ progress note  Patient Details  Name: Diana Coleman MRN: [id number] Date of Birth: 14-Feb-1951 Referring Provider (PT): Keith Rake   Progress Note  Reporting period 10/17/21 to 12/12/21  See Note below for Objective Data and Assessment of Progress/Goals   Encounter Date: 12/12/2021   PT End of Session - 12/12/21 0845     Visit Number 10    Number of Visits 26    Date for PT Re-Evaluation 02/07/22    Authorization Type [employer] medicare so 10th visit progress note    Progress Note Due on Visit 10    PT Start Time 0844    PT Stop Time 0930    PT Time Calculation (min) 46 min    Equipment Utilized During Treatment Gait belt    Activity Tolerance Patient tolerated treatment well    Behavior During Therapy [facility] for tasks assessed/performed             Past Medical History:  Diagnosis Date   Cellulitis of foot, left 10/24/2014   hx/notes 10/24/2014   Charcot foot due to diabetes mellitus ([place])    Hepatitis    Hep B    Hypertension    Osteomyelitis of right foot ([place])    hx/notes 10/24/2014   Proliferative diabetic retinopathy ([place])    right eye and vitreous hemorrhage   Type II diabetes mellitus ([place])    Since 1998    Past Surgical History:  Procedure Laterality Date   ABOVE KNEE LEG AMPUTATION Right 2004   AMPUTATION Left 10/29/2014   Procedure: AMPUTATION BELOW KNEE - LEFT;  Surgeon: Newt Minion, MD;  Location: [facility];  Service: Orthopedics;  Laterality: Left;   CATARACT EXTRACTION W/ INTRAOCULAR LENS  IMPLANT, BILATERAL     FOOT SURGERY Left 1980's   "ulcer removed"   I & D EXTREMITY Right 10/04/2016   Procedure: IRRIGATION AND DEBRIDEMENT MIDDLE FINGER AND AMPUTATION;  Surgeon: Iran Planas, MD;  Location: [place];  Service: Orthopedics;  Laterality: Right;   MEMBRANE PEEL Right  02/01/2019   Procedure: Carvel Getting;  Surgeon: Hayden Pedro, MD;  Location: [facility];  Service: Ophthalmology;  Laterality: Right;   PARS PLANA VITRECTOMY 27 GAUGE Right 02/01/2019   PARS PLANA VITRECTOMY 27 GAUGE Right 02/01/2019   Procedure: PARS PLANA VITRECTOMY 27 GAUGE, MEMBRANE PEEL, ENDOLASER, GAS INJECTION;  Surgeon: Hayden Pedro, MD;  Location: [facility];  Service: Ophthalmology;  Laterality: Right;   REDUCTION MAMMAPLASTY Bilateral 1987   TOE AMPUTATION Left ~ 2011   "top of my 3rd toe"   TUBAL LIGATION  1973    There were no vitals filed for this visit.   Subjective Assessment - 12/12/21 0847     Subjective Pt reports doing well. She does still have the small place on residual limb and needs some more tegaderm. She sees Control and instrumentation engineer. She feels she is still sliding down too deep. She has appointment with Dr. Manuella Ghazi on 1/5.    Pertinent History Type 2 DM, osteomyelitis, Charcot foot, abnormal EKG.    Patient Stated Goals Pt would like to strengthen her legs and be able to get up from chair easier.    Currently in Pain? No/denies                [facility] PT Assessment - 12/12/21 0902  Assessment   Medical Diagnosis bilateral BKA with weakness    Referring Provider (PT) Keith Rake    Onset Date/Surgical Date 09/10/21                           [facility] Adult PT Treatment/Exercise - 12/12/21 0902       Transfers   Transfers Sit to Stand;Stand to Sit    Sit to Stand 5: Supervision    Sit to Stand Details (indicate cue type and reason) with wide BOS and hands for stability    Stand to Sit 5: Supervision    Comments Sit to stand x 3 from edge of mat without hands but braces legs some on mat and wide BOS      Ambulation/Gait   Ambulation/Gait Yes    Ambulation/Gait Assistance 5: Supervision    Ambulation/Gait Assistance Details around in clinc between activities    Assistive device Straight cane;Prosthesis    Gait Pattern Step-through  pattern;Wide base of support    Ambulation Surface Level;Indoor    Gait velocity 11.03 sec=0.31ms    Stairs Yes    Stairs Assistance 5: Supervision    Stair Management Technique One rail Left;With cane;Step to pattern    Number of Stairs 4    Height of Stairs 6    Ramp 5: Supervision    Ramp Details (indicate cue type and reason) with cane    Curb 5: Supervision    Curb Details (indicate cue type and reason) with cane      Standardized Balance Assessment   Standardized Balance Assessment Berg Balance Test;Dynamic Gait Index      Berg Balance Test   Sit to Stand Able to stand  independently using hands    Standing Unsupported Able to stand 2 minutes with supervision    Sitting with Back Unsupported but Feet Supported on Floor or Stool Able to sit safely and securely 2 minutes    Stand to Sit Sits safely with minimal use of hands    Transfers Able to transfer safely, definite need of hands    Standing Unsupported with Eyes Closed Able to stand 10 seconds with supervision    Standing Ubsupported with Feet Together Able to place feet together independently and stand for 1 minute with supervision    From Standing, Reach Forward with Outstretched Arm Can reach forward >12 cm safely (5")    From Standing Position, Pick up Object from Floor Able to pick up shoe, needs supervision    From Standing Position, Turn to Look Behind Over each Shoulder Needs supervision when turning    Turn 360 Degrees Able to turn 360 degrees safely but slowly    Standing Unsupported, Alternately Place Feet on Step/Stool Needs assistance to keep from falling or unable to try    Standing Unsupported, One Foot in Front Needs help to step but can hold 15 seconds    Standing on One Leg Tries to lift leg/unable to hold 3 seconds but remains standing independently    Total Score 34      Dynamic Gait Index   Level Surface Mild Impairment    Change in Gait Speed Mild Impairment    Gait with Horizontal Head Turns Normal     Gait with Vertical Head Turns Normal    Gait and Pivot Turn Mild Impairment    Step Over Obstacle Moderate Impairment    Step Around Obstacles Mild Impairment    Steps Moderate Impairment  Total Score 16      Prosthetics   Prosthetic Care Comments  PT again emphasized importance of removing leg every couple hours to pat skin dry along with liner to prevent further breakdown. Discussed to have tegaderm over wound when prosthesis on or wear vivewear shrinker. Will need to change out if gets moist as well.    Current prosthetic wear tolerance (days/week)  daily    Current prosthetic wear tolerance (#hours/day)  all awake hours    Residual limb condition  Pt has dime sized open wound on left distal residual limb. Pink tissue in middle and white macerated edges. PT applied tegaderm and gave pt some extra. Also fit for vivewear shrinker to use when tegaderm runs out. Issued 2 size large.    Education Provided Residual limb care    Person(s) Educated Patient    Education Method Explanation;Demonstration    Education Method Verbalized understanding                     PT Education - 12/12/21 1923     Education Details residual limb care. Results of testing and recert plan.    Person(s) Educated Patient    Methods Explanation    Comprehension Verbalized understanding              PT Short Term Goals - 11/07/21 0730       PT SHORT TERM GOAL #1   Title Pt will be independent with HEP for strengthening and balance to continue gains at home.    Baseline HEP given on 10/24/2021    Time --    Period --    Status Achieved    Target Date --      PT SHORT TERM GOAL #2   Title Pt will decrease 5 x sit to stand from 12.47 sec to < 11 sec for improved balance and functional mobility.    Baseline 11/07/2021 9.94 sec with UE assist from standard chair    Time --    Period --    Status Achieved    Target Date --      PT SHORT TERM GOAL #3   Title Pt will ambulate >1000'  on varied level surfaces with cane for improved community mobility.    Baseline 24' with cane supervision on eval; 11/07/2021 defered due to weather    Time --    Period --    Status Deferred    Target Date --               PT Long Term Goals - 12/12/21 1925       PT LONG TERM GOAL #1   Title Pt will increase Berg score from 34 to >40/56 for improved balance and functional mobility.    Baseline 10/17/21 34/56. 12/12/21 34/56    Time 8    Period Weeks    Status Not Met    Target Date 12/13/21      PT LONG TERM GOAL #2   Title Pt will increase gait speed to >1.06 m/s for improved community ambulator speed.    Baseline 10/17/21 0.8ms. 12/12/21 0.935m    Time 8    Period Weeks    Status Not Met    Target Date 12/13/21      PT LONG TERM GOAL #3   Title Pt will be able to perform sit to stand x 3 from chair without hands for improved functional strength.    Baseline 10/17/21 needs hands to  rise. 12/12/21 able to rise 3 times from mat without hands but does brace back of leg on mat at times.    Time 8    Period Weeks    Status Partially Met    Target Date 12/13/21      PT LONG TERM GOAL #4   Title Pt will ambulate up/down 4 steps with 1 rail and cane, up/down ramp with cane and up/down curb with cane mod I for improved community access.    Baseline 12/12/21 4 steps with 1 rail and cane in step-to pattern supervision, ramp and curb with cane supervision    Time 8    Period Weeks    Status Partially Met    Target Date 12/13/21            Updated goals:  PT Short Term Goals - 12/12/21 1940       PT SHORT TERM GOAL #1   Title Pt will be independent with progressive HEP for strengthening and balance to continue gains on own.    Baseline PT continues to add to HEP    Time 4    Period Weeks    Status New    Target Date 01/09/22      PT SHORT TERM GOAL #2   Title Pt's will be able to tolerate wearing prosthesis all awake hours with no skin issues.    Baseline  currently has dime sized sore on left residual limb    Time 4    Period Weeks    Status New    Target Date 01/09/22      PT SHORT TERM GOAL #3   Title Pt will increase Berg form 34 to >38/56 for improved balance.    Baseline 12/12/21 34/56    Time 4    Period Weeks    Status New    Target Date 01/09/22      PT SHORT TERM GOAL #4   Title Pt will be able to perform sit to stand x 5 from chair without hands for improved functional strength.    Baseline 12/12/21 3 reps from mat but bracing legs on mat at times.    Time 4    Period Weeks    Status New    Target Date 01/09/22             PT Long Term Goals - 12/12/21 1945       PT LONG TERM GOAL #1   Title Pt will increase DGI from 16 to >19/24 for improved balance and functional mobility. (LTGs due 02/07/22)    Baseline 12/12/21 16/24    Time 8    Period Weeks    Status New    Target Date 02/07/22      PT LONG TERM GOAL #2   Title Pt will ambulate >500' on varied level surfaces with cane versus no device for improved community mobility.    Time 8    Period Weeks    Status New    Target Date 02/07/22      PT LONG TERM GOAL #3   Title Pt will ambulate up/down 4 steps with 1 rail and cane, up/down ramp with cane and up/down curb with cane mod I for improved community access.    Baseline 12/12/21 4 steps with 1 rail and cane in step-to pattern supervision, ramp and curb with cane supervision    Time 8    Period Weeks    Status On-going    Target Date  02/07/22                   Plan - 12/12/21 1927     Clinical Impression Statement PT assessed LTGs at visit today. Pt was not able to increase Berg score today maintaining at 34/56 indicating high fall risk. Assessed DGI today which also indicates high fall risk with score of 16/24. Pt's gait speed did not increase. Ambulating at 0.20ms indicating safe community ambulator speed, however, still below norm. Pt has been able to demonstrate improvements in functional  strength as is able to stand x 3 reps without hands from mat. She does brace legs against mat at times. Pt is at supervision level with performance of steps, ramp and curb with cane. Pt has developed sore on left lateral residual limb from blister. PT continues to educate on importance of drying leg every couple hours to help with sweat management. Issued tegaderm as well as 2 vivewear shrinkers. Pt will be seeing prosthetist tomorrow and vascular doctor 1/5 to assess. Pt will benefit from continued PT to continue to work on improving balance and functional mobility as well as prosthetic education.    Personal Factors and Comorbidities Comorbidity 3+    Comorbidities Type 2 DM, osteomyelitis, Charcot foot, abnormal EKG.    Examination-Activity Limitations Locomotion Level;Transfers;Stairs;Squat;Stand    Examination-Participation Restrictions CNurse, adultYard Work    SMerchant navy officerEvolving/Moderate complexity    Rehab Potential Good    PT Frequency 2x / week    PT Duration 8 weeks    PT Treatment/Interventions ADLs/Self Care Home Management;DME Instruction;Gait training;Stair training;Functional mobility training;Therapeutic activities;Therapeutic exercise;Balance training;Neuromuscular re-education;Manual techniques;Prosthetic Training;Passive range of motion;Patient/family education;Vestibular    PT Next Visit Plan I completed recert already [place] Continue to monitor wound. continue to work on LE strengthening, balance with decreased UE support and gait/dynamic gait with prostheses only. continue with stair training.    PT Home Exercise Plan [id number]   Consulted and Agree with Plan of Care Patient             Patient will benefit from skilled therapeutic intervention in order to improve the following deficits and impairments:  Abnormal gait, Decreased mobility, Decreased strength, Decreased balance, Decreased knowledge of use of DME, Decreased  range of motion, Impaired sensation, Prosthetic Dependency  Visit Diagnosis: Other abnormalities of gait and mobility  Muscle weakness (generalized)  Unsteadiness on feet  Abnormal posture     Problem List Patient Active Problem List   Diagnosis Date Noted   Dyslipidemia 10/18/2020   Vitreous hemorrhage of right eye due to diabetes mellitus ([facility] 02/01/2019   Preop cardiovascular exam 10/22/2018   Chest pain 10/22/2018   Vitreous hemorrhage, right eye ([facility] 09/21/2018   Proliferative diabetic retinopathy with macular edema ([facility] 09/21/2018   Non-pressure chronic ulcer of left calf, limited to breakdown of skin ([facility] 11/12/2016   Status post bilateral below knee amputation ([facility] 11/12/2016   Normocytic anemia 10/04/2016   Osteomyelitis ([facility] 10/04/2016   Finger infection 10/04/2016   Osteomyelitis of finger ([facility] 10/04/2016   Gastroparesis 11/02/2014   Diabetes mellitus due to underlying condition with other specified complication ([facility]    Diabetic foot ([facility])    Nausea & vomiting    Essential hypertension 10/25/2014   GERD (gastroesophageal reflux disease) 10/25/2014   Cellulitis 10/24/2014   Diabetes ([facility] 10/24/2014   Insulin dependent diabetes mellitus ([facility] 10/24/2014    EElecta Sniff PT, DPT, NCS 12/12/2021, 7:40 PM  [facility] [address] [place], [place], [address] Phone: [phone]   Fax:  [phone]  Name: Diana Coleman MRN: [id number] Date of Birth: [birth year]

## 2021-12-12 NOTE — Therapy (Deleted)
12/12/21 Date 04/Feb/2022 @date @

## 2021-12-13 ENCOUNTER — Ambulatory Visit: Payer: Medicare Other

## 2021-12-13 DIAGNOSIS — R2689 Other abnormalities of gait and mobility: Secondary | ICD-10-CM

## 2021-12-13 DIAGNOSIS — M6281 Muscle weakness (generalized): Secondary | ICD-10-CM

## 2021-12-13 DIAGNOSIS — R2681 Unsteadiness on feet: Secondary | ICD-10-CM

## 2021-12-13 DIAGNOSIS — R293 Abnormal posture: Secondary | ICD-10-CM

## 2021-12-13 NOTE — Therapy (Signed)
[facility] [address] [place], [place], [address] Phone: [phone]   Fax:  [phone]  Physical Therapy Treatment  Patient Details  Name: Diana Coleman MRN: [id number] Date of Birth: 05/21/1951 Referring Provider (Diana Coleman): Keith Rake   Encounter Date: 12/13/2021   Diana Coleman End of Session - 12/13/21 0857     Visit Number 11    Number of Visits 26    Date for Diana Coleman Re-Evaluation 02/07/22    Authorization Type UHC medicare so 10th visit progress note    Progress Note Due on Visit 10    Diana Coleman Start Time 0850    Diana Coleman Stop Time 0935    Diana Coleman Time Calculation (min) 45 min    Equipment Utilized During Treatment Gait belt    Activity Tolerance Patient tolerated treatment well    Behavior During Therapy [facility] for tasks assessed/performed             Past Medical History:  Diagnosis Date   Cellulitis of foot, left 10/24/2014   hx/notes 10/24/2014   Charcot foot due to diabetes mellitus ([place])    Hepatitis    Hep B    Hypertension    Osteomyelitis of right foot ([place])    hx/notes 10/24/2014   Proliferative diabetic retinopathy ([place])    right eye and vitreous hemorrhage   Type II diabetes mellitus ([place])    Since 1998    Past Surgical History:  Procedure Laterality Date   ABOVE KNEE LEG AMPUTATION Right 2004   AMPUTATION Left 10/29/2014   Procedure: AMPUTATION BELOW KNEE - LEFT;  Surgeon: Newt Minion, MD;  Location: [facility];  Service: Orthopedics;  Laterality: Left;   CATARACT EXTRACTION W/ INTRAOCULAR LENS  IMPLANT, BILATERAL     FOOT SURGERY Left 1980's   "ulcer removed"   I & D EXTREMITY Right 10/04/2016   Procedure: IRRIGATION AND DEBRIDEMENT MIDDLE FINGER AND AMPUTATION;  Surgeon: Iran Planas, MD;  Location: [facility];  Service: Orthopedics;  Laterality: Right;   MEMBRANE PEEL Right 02/01/2019   Procedure: Carvel Getting;  Surgeon: Hayden Pedro, MD;  Location: [facility];  Service: Ophthalmology;  Laterality: Right;   PARS PLANA  VITRECTOMY 27 GAUGE Right 02/01/2019   PARS PLANA VITRECTOMY 27 GAUGE Right 02/01/2019   Procedure: PARS PLANA VITRECTOMY 27 GAUGE, MEMBRANE PEEL, ENDOLASER, GAS INJECTION;  Surgeon: Hayden Pedro, MD;  Location: [facility];  Service: Ophthalmology;  Laterality: Right;   REDUCTION MAMMAPLASTY Bilateral 1987   TOE AMPUTATION Left ~ 2011   "top of my 3rd toe"   TUBAL LIGATION  1973    There were no vitals filed for this visit.   Subjective Assessment - 12/13/21 0905     Subjective Diana Coleman reports she is doing okay. Blister on leg is doing okay. She is going to see Dr. Manuella Ghazi today. She reports of no pain or phantom pain at night.    Pertinent History Type 2 DM, osteomyelitis, Charcot foot, abnormal EKG.    Patient Stated Goals Diana Coleman would like to strengthen her legs and be able to get up from chair easier.    Currently in Pain? No/denies                 Prosthetic education: Sweat management: re educated to pat dry residual leg and liner to keep it dry and help with wound healing. Discussed hand washing vive wear socks and letting them dry and clean. Discussed potentially use of sweat block wipes (info given to  patient). Patient will discuss potentially prescription strength deodrant that may be covered by her insurance, she is to discuss with her doctor.  Prosthetic wear: Diana Coleman educated on listening for number of clicks and her goal to be between 6-9 clicks for proper suspension of residual leg in the socket to prevent it from bottoming out in the socket. Diana Coleman didn't have any extra socks but advised her to add either 3 ply or 2 ply on top of her 5 ply socks when she goes home.  Added 1/4 inch lift inside her L foot to improve her potential leg discrepancy. Diana Coleman demo less waddle with heel lift in there.  Standing balance assessment: Diana Coleman tends to WB more on R LE when standing compared to L LE. Patient also demonstrated less shifting on L LE with trunk twist and functional reach to L . Diana Coleman educated on being  mindful of shifting weight to L LE to improve pain in R hip.  Gait training: with cane in L UE to off load some weight from R LE (to reduce R hip pain) and to improve WB on L LE 1 x 345'  Stair training: 1x 4 steps with bil hand rail and going up with L LE and down with recriprocal patten with cues to have ball of foot at edge of step for pivoting 1 x 4 steps with Up with R leg: Diana Coleman has a limp due to pain in R hip. Down with L LE. With bil hand rail 1 x 8 steps with rail on L going up and cane in R hand:"good (L LE) goes to heaven, bad (R LE) goes to hell> Diana Coleman educated to try this at her daughter's house with her daughter in front of her when coming down and behind her when going up where she can hold the wall on her left when goin up and cane in opp. Arm                          Diana Coleman Short Term Goals - 12/12/21 1940       Diana Coleman SHORT TERM GOAL #1   Title Diana Coleman will be independent with progressive HEP for strengthening and balance to continue gains on own.    Baseline Diana Coleman continues to add to HEP    Time 4    Period Weeks    Status New    Target Date 01/09/22      Diana Coleman SHORT TERM GOAL #2   Title Diana Coleman's will be able to tolerate wearing prosthesis all awake hours with no skin issues.    Baseline currently has dime sized sore on left residual limb    Time 4    Period Weeks    Status New    Target Date 01/09/22      Diana Coleman SHORT TERM GOAL #3   Title Diana Coleman will increase Berg form 34 to >38/56 for improved balance.    Baseline 12/12/21 34/56    Time 4    Period Weeks    Status New    Target Date 01/09/22      Diana Coleman SHORT TERM GOAL #4   Title Diana Coleman will be able to perform sit to stand x 5 from chair without hands for improved functional strength.    Baseline 12/12/21 3 reps from mat but bracing legs on mat at times.    Time 4    Period Weeks    Status New    Target Date 01/09/22  Diana Coleman Long Term Goals - 12/12/21 1945       Diana Coleman LONG TERM GOAL #1   Title Diana Coleman will  increase DGI from 16 to >19/24 for improved balance and functional mobility. (LTGs due 02/07/22)    Baseline 12/12/21 16/24    Time 8    Period Weeks    Status New    Target Date 02/07/22      Diana Coleman LONG TERM GOAL #2   Title Diana Coleman will ambulate >500' on varied level surfaces with cane versus no device for improved community mobility.    Time 8    Period Weeks    Status New    Target Date 02/07/22      Diana Coleman LONG TERM GOAL #3   Title Diana Coleman will ambulate up/down 4 steps with 1 rail and cane, up/down ramp with cane and up/down curb with cane mod I for improved community access.    Baseline 12/12/21 4 steps with 1 rail and cane in step-to pattern supervision, ramp and curb with cane supervision    Time 8    Period Weeks    Status On-going    Target Date 02/07/22                   Plan - 12/13/21 0940     Clinical Impression Statement Today's sesssion was focused on continued education for sweat management and sock management. Heel lift was put in L foot to improve leg length discrepancy which also improved with lateral trunk lean during L stance phase. Diana Coleman educated on working on functional reaching ([place], lateral and to floor) without UE support and we worked on Engineer, petroleum.    Personal Factors and Comorbidities Comorbidity 3+    Comorbidities Type 2 DM, osteomyelitis, Charcot foot, abnormal EKG.    Examination-Activity Limitations Locomotion Level;Transfers;Stairs;Squat;Stand    Examination-Participation Restrictions Nurse, adult;Yard Work    Merchant navy officer Evolving/Moderate complexity    Rehab Potential Good    Diana Coleman Frequency 2x / week    Diana Coleman Duration 8 weeks    Diana Coleman Treatment/Interventions ADLs/Self Care Home Management;DME Instruction;Gait training;Stair training;Functional mobility training;Therapeutic activities;Therapeutic exercise;Balance training;Neuromuscular re-education;Manual techniques;Prosthetic Training;Passive range of  motion;Patient/family education;Vestibular    Diana Coleman Next Visit Plan I completed recert already [place]. Continue to monitor wound. continue to work on LE strengthening, balance with decreased UE support and gait/dynamic gait with prostheses only. continue with stair training.    Diana Coleman Home Exercise Plan [id number]    Consulted and Agree with Plan of Care Patient             Patient will benefit from skilled therapeutic intervention in order to improve the following deficits and impairments:  Abnormal gait, Decreased mobility, Decreased strength, Decreased balance, Decreased knowledge of use of DME, Decreased range of motion, Impaired sensation, Prosthetic Dependency  Visit Diagnosis: Other abnormalities of gait and mobility  Muscle weakness (generalized)  Unsteadiness on feet  Abnormal posture     Problem List Patient Active Problem List   Diagnosis Date Noted   Dyslipidemia 10/18/2020   Vitreous hemorrhage of right eye due to diabetes mellitus ([place]) 02/01/2019   Preop cardiovascular exam 10/22/2018   Chest pain 10/22/2018   Vitreous hemorrhage, right eye ([place]) 09/21/2018   Proliferative diabetic retinopathy with macular edema ([place]) 09/21/2018   Non-pressure chronic ulcer of left calf, limited to breakdown of skin ([place]) 11/12/2016   Status post bilateral below knee amputation ([place]) 11/12/2016   Normocytic anemia 10/04/2016   Osteomyelitis ([place]) 10/04/2016  Finger infection 10/04/2016   Osteomyelitis of finger ([place]) 10/04/2016   Gastroparesis 11/02/2014   Diabetes mellitus due to underlying condition with other specified complication ([place])    Diabetic foot ([place])    Nausea & vomiting    Essential hypertension 10/25/2014   GERD (gastroesophageal reflux disease) 10/25/2014   Cellulitis 10/24/2014   Diabetes ([place]) 10/24/2014   Insulin dependent diabetes mellitus ([place]) 10/24/2014    Diana Coleman, Diana Coleman 12/13/2021, 9:44 AM  [facility] [address] [place], [place], [address] Phone: [phone]   Fax:  [phone]  Name: Diana Coleman MRN: [id number] Date of Birth: 12/13/1951

## 2021-12-17 ENCOUNTER — Ambulatory Visit: Payer: Medicare Other | Admitting: Physical Therapy

## 2021-12-19 ENCOUNTER — Encounter: Payer: Self-pay | Admitting: Physical Therapy

## 2021-12-19 ENCOUNTER — Other Ambulatory Visit: Payer: Self-pay

## 2021-12-19 ENCOUNTER — Ambulatory Visit: Payer: Medicare Other | Admitting: Physical Therapy

## 2021-12-19 DIAGNOSIS — R2681 Unsteadiness on feet: Secondary | ICD-10-CM

## 2021-12-19 DIAGNOSIS — R2689 Other abnormalities of gait and mobility: Secondary | ICD-10-CM

## 2021-12-19 DIAGNOSIS — M6281 Muscle weakness (generalized): Secondary | ICD-10-CM

## 2021-12-19 DIAGNOSIS — R293 Abnormal posture: Secondary | ICD-10-CM

## 2021-12-19 NOTE — Therapy (Signed)
[facility] [address] [place], [place], [address] Phone: [phone]   Fax:  [phone]  Physical Therapy Treatment  Patient Details  Name: Diana Coleman MRN: [id number] Date of Birth: [birth year] Referring Provider (PT): Keith Rake   Encounter Date: 12/19/2021   PT End of Session - 12/19/21 0723     Visit Number 12    Number of Visits 26    Date for PT Re-Evaluation 02/07/22    Authorization Type UHC medicare so 10th visit progress note    Progress Note Due on Visit 20    PT Start Time 0718    PT Stop Time 0758    PT Time Calculation (min) 40 min    Equipment Utilized During Treatment Gait belt    Activity Tolerance Patient tolerated treatment well    Behavior During Therapy [facility] for tasks assessed/performed             Past Medical History:  Diagnosis Date   Cellulitis of foot, left 10/24/2014   hx/notes 10/24/2014   Charcot foot due to diabetes mellitus ([place])    Hepatitis    Hep B    Hypertension    Osteomyelitis of right foot ([place])    hx/notes 10/24/2014   Proliferative diabetic retinopathy ([place])    right eye and vitreous hemorrhage   Type II diabetes mellitus ([place])    Since 1998    Past Surgical History:  Procedure Laterality Date   ABOVE KNEE LEG AMPUTATION Right 2004   AMPUTATION Left 10/29/2014   Procedure: AMPUTATION BELOW KNEE - LEFT;  Surgeon: Newt Minion, MD;  Location: [facility];  Service: Orthopedics;  Laterality: Left;   CATARACT EXTRACTION W/ INTRAOCULAR LENS  IMPLANT, BILATERAL     FOOT SURGERY Left 1980's   "ulcer removed"   I & D EXTREMITY Right 10/04/2016   Procedure: IRRIGATION AND DEBRIDEMENT MIDDLE FINGER AND AMPUTATION;  Surgeon: Iran Planas, MD;  Location: [facility];  Service: Orthopedics;  Laterality: Right;   MEMBRANE PEEL Right 02/01/2019   Procedure: Carvel Getting;  Surgeon: Hayden Pedro, MD;  Location: [facility];  Service: Ophthalmology;  Laterality: Right;   PARS PLANA  VITRECTOMY 27 GAUGE Right 02/01/2019   PARS PLANA VITRECTOMY 27 GAUGE Right 02/01/2019   Procedure: PARS PLANA VITRECTOMY 27 GAUGE, MEMBRANE PEEL, ENDOLASER, GAS INJECTION;  Surgeon: Hayden Pedro, MD;  Location: [facility];  Service: Ophthalmology;  Laterality: Right;   REDUCTION MAMMAPLASTY Bilateral 1987   TOE AMPUTATION Left ~ 2011   "top of my 3rd toe"   TUBAL LIGATION  1973    There were no vitals filed for this visit.   Subjective Assessment - 12/19/21 0722     Subjective No new complaints. No falls or pain to report.    Pertinent History Type 2 DM, osteomyelitis, Charcot foot, abnormal EKG.    Patient Stated Goals Pt would like to strengthen her legs and be able to get up from chair easier.    Currently in Pain? No/denies                    [facility] Adult PT Treatment/Exercise - 12/19/21 0725       Transfers   Transfers Sit to Stand;Stand to Sit    Sit to Stand 5: Supervision    Stand to Sit 5: Supervision      Ambulation/Gait   Ambulation/Gait Yes    Ambulation/Gait Assistance 5: Supervision    Ambulation/Gait Assistance Details pt  instructed last session to keep cane on left side, however pt with increased unsteadiness with cane on this side, mostly carrying it at times and not using it. had pt change back to holding the cane on the right side with improved balance noted with cane on floor and good sequencing noted.    Ambulation Distance (Feet) 230 Feet   x1, plus around clinic with session   Assistive device Straight cane;Prostheses    Gait Pattern Step-through pattern;Wide base of support      High Level Balance   High Level Balance Activities Side stepping;Marching forwards;Marching backwards    High Level Balance Comments blue mat in parallel bars: with light support on bars for 3 laps each with min guard assist for safety. cues on posture and ex form/technique.      Knee/Hip Exercises: Aerobic   Other Aerobic SciFit Level 3.5 for 8 minutes with LE's with  goal >/= 80 steps per minute for strengthening and activity tolerance.      Prosthetics   Prosthetic Care Comments  continued to emphasize keeping limb dry to promote skin integrity and wound healing.    Current prosthetic wear tolerance (days/week)  daily    Current prosthetic wear tolerance (#hours/day)  all awake hours    Residual limb condition  Pt has dime sized open wound on left distal residual limb. Pink tissue in middle and white macerated edges. pt wearing vivewear shrinker under liner over wound.                 Balance Exercises - 12/19/21 0752       Balance Exercises: Standing   Standing Eyes Closed Wide (BOA);Foam/compliant surface;3 reps;30 secs;Limitations    Standing Eyes Closed Limitations on airex with feet hip width apart, no UE support for EC 30 sec's x 3 reps. cues on posture/weight shifting for balance assistance.    Partial Tandem Stance Eyes closed;Foam/compliant surface;Intermittent upper extremity support;30 secs;Limitations;3 reps    Partial Tandem Stance Limitations on airex with finger tip support/touch to bars for 3 reps each foot forward. cues on posture/weight shifting to assist with balance.                  PT Short Term Goals - 12/12/21 1940       PT SHORT TERM GOAL #1   Title Pt will be independent with progressive HEP for strengthening and balance to continue gains on own.    Baseline PT continues to add to HEP    Time 4    Period Weeks    Status New    Target Date 01/09/22      PT SHORT TERM GOAL #2   Title Pt's will be able to tolerate wearing prosthesis all awake hours with no skin issues.    Baseline currently has dime sized sore on left residual limb    Time 4    Period Weeks    Status New    Target Date 01/09/22      PT SHORT TERM GOAL #3   Title Pt will increase Berg form 34 to >38/56 for improved balance.    Baseline 12/12/21 34/56    Time 4    Period Weeks    Status New    Target Date 01/09/22      PT SHORT  TERM GOAL #4   Title Pt will be able to perform sit to stand x 5 from chair without hands for improved functional strength.    Baseline 12/12/21 3  reps from mat but bracing legs on mat at times.    Time 4    Period Weeks    Status New    Target Date 01/09/22               PT Long Term Goals - 12/12/21 1945       PT LONG TERM GOAL #1   Title Pt will increase DGI from 16 to >19/24 for improved balance and functional mobility. (LTGs due 02/07/22)    Baseline 12/12/21 16/24    Time 8    Period Weeks    Status New    Target Date 02/07/22      PT LONG TERM GOAL #2   Title Pt will ambulate >500' on varied level surfaces with cane versus no device for improved community mobility.    Time 8    Period Weeks    Status New    Target Date 02/07/22      PT LONG TERM GOAL #3   Title Pt will ambulate up/down 4 steps with 1 rail and cane, up/down ramp with cane and up/down curb with cane mod I for improved community access.    Baseline 12/12/21 4 steps with 1 rail and cane in step-to pattern supervision, ramp and curb with cane supervision    Time 8    Period Weeks    Status On-going    Target Date 02/07/22                   Plan - 12/19/21 0725     Clinical Impression Statement Today's skilled session continued to address gait with cane/prostheses, limb care and balance training. Did not work on gait with just prosthses so to limit pressure on limb until wound heals more. No issues noted or reported in session. The pt is making progress and should benefit from continued PT to progress toward unmet goals.    Personal Factors and Comorbidities Comorbidity 3+    Comorbidities Type 2 DM, osteomyelitis, Charcot foot, abnormal EKG.    Examination-Activity Limitations Locomotion Level;Transfers;Stairs;Squat;Stand    Examination-Participation Restrictions Nurse, adult;Yard Work    Merchant navy officer Evolving/Moderate complexity    Rehab  Potential Good    PT Frequency 2x / week    PT Duration 8 weeks    PT Treatment/Interventions ADLs/Self Care Home Management;DME Instruction;Gait training;Stair training;Functional mobility training;Therapeutic activities;Therapeutic exercise;Balance training;Neuromuscular re-education;Manual techniques;Prosthetic Training;Passive range of motion;Patient/family education;Vestibular    PT Next Visit Plan did pt call and reschedule with Mortimer F[facility]? See's MD next week as well and plans to have wound looked at.  Continue to monitor wound. continue to work on LE strengthening, balance with decreased UE support and gait/dynamic gait with prostheses only. continue with stair training.    PT Home Exercise Plan [id number]    Consulted and Agree with Plan of Care Patient             Patient will benefit from skilled therapeutic intervention in order to improve the following deficits and impairments:  Abnormal gait, Decreased mobility, Decreased strength, Decreased balance, Decreased knowledge of use of DME, Decreased range of motion, Impaired sensation, Prosthetic Dependency  Visit Diagnosis: Other abnormalities of gait and mobility  Muscle weakness (generalized)  Unsteadiness on feet  Abnormal posture     Problem List Patient Active Problem List   Diagnosis Date Noted   Dyslipidemia 10/18/2020   Vitreous hemorrhage of right eye due to diabetes mellitus ([place]) 02/01/2019   Preop cardiovascular exam  10/22/2018   Chest pain 10/22/2018   Vitreous hemorrhage, right eye ([place]) 09/21/2018   Proliferative diabetic retinopathy with macular edema ([place]) 09/21/2018   Non-pressure chronic ulcer of left calf, limited to breakdown of skin ([place]) 11/12/2016   Status post bilateral below knee amputation ([place]) 11/12/2016   Normocytic anemia 10/04/2016   Osteomyelitis ([place]) 10/04/2016   Finger infection 10/04/2016   Osteomyelitis of finger ([place]) 10/04/2016   Gastroparesis 11/02/2014   Diabetes  mellitus due to underlying condition with other specified complication ([place])    Diabetic foot ([facility])    Nausea & vomiting    Essential hypertension 10/25/2014   GERD (gastroesophageal reflux disease) 10/25/2014   Cellulitis 10/24/2014   Diabetes ([place]) 10/24/2014   Insulin dependent diabetes mellitus ([place]) 10/24/2014   Willow Ora, PTA, [facility] Outpatient Neuro [facility] [address], [place], [place] [address] [phone] 12/19/21, 8:21 AM   Name: Diana Coleman MRN: [id number] Date of Birth: 03-Feb-1951

## 2021-12-24 ENCOUNTER — Encounter: Payer: Self-pay | Admitting: Physical Therapy

## 2021-12-24 ENCOUNTER — Other Ambulatory Visit: Payer: Self-pay

## 2021-12-24 ENCOUNTER — Ambulatory Visit: Payer: Medicare Other | Attending: General Practice | Admitting: Physical Therapy

## 2021-12-24 DIAGNOSIS — R2689 Other abnormalities of gait and mobility: Secondary | ICD-10-CM | POA: Diagnosis not present

## 2021-12-24 DIAGNOSIS — M6281 Muscle weakness (generalized): Secondary | ICD-10-CM | POA: Diagnosis present

## 2021-12-24 DIAGNOSIS — R293 Abnormal posture: Secondary | ICD-10-CM | POA: Diagnosis present

## 2021-12-24 DIAGNOSIS — R2681 Unsteadiness on feet: Secondary | ICD-10-CM | POA: Insufficient documentation

## 2021-12-24 NOTE — Therapy (Signed)
[facility] [address] [place], [place], [address] Phone: [phone]   Fax:  [phone]  Physical Therapy Treatment  Patient Details  Name: Diana Coleman MRN: [id number] Date of Birth: 1951/07/24 Referring Provider (PT): Keith Rake   Encounter Date: 12/24/2021   PT End of Session - 12/24/21 0852     Visit Number 13    Number of Visits 26    Date for PT Re-Evaluation 02/07/22    Authorization Type UHC medicare so 10th visit progress note    Progress Note Due on Visit 20    PT Start Time 0848    PT Stop Time 0930    PT Time Calculation (min) 42 min    Equipment Utilized During Treatment Gait belt    Activity Tolerance Patient tolerated treatment well    Behavior During Therapy [facility] for tasks assessed/performed             Past Medical History:  Diagnosis Date   Cellulitis of foot, left 10/24/2014   hx/notes 10/24/2014   Charcot foot due to diabetes mellitus ([place])    Hepatitis    Hep B    Hypertension    Osteomyelitis of right foot ([place])    hx/notes 10/24/2014   Proliferative diabetic retinopathy ([place])    right eye and vitreous hemorrhage   Type II diabetes mellitus ([place])    Since 1998    Past Surgical History:  Procedure Laterality Date   ABOVE KNEE LEG AMPUTATION Right 2004   AMPUTATION Left 10/29/2014   Procedure: AMPUTATION BELOW KNEE - LEFT;  Surgeon: Newt Minion, MD;  Location: [facility] [facility];  Service: Orthopedics;  Laterality: Left;   CATARACT EXTRACTION W/ INTRAOCULAR LENS  IMPLANT, BILATERAL     FOOT SURGERY Left 1980's   "ulcer removed"   I & D EXTREMITY Right 10/04/2016   Procedure: IRRIGATION AND DEBRIDEMENT MIDDLE FINGER AND AMPUTATION;  Surgeon: Iran Planas, MD;  Location: [facility];  Service: Orthopedics;  Laterality: Right;   MEMBRANE PEEL Right 02/01/2019   Procedure: Carvel Getting;  Surgeon: Hayden Pedro, MD;  Location: [facility];  Service: Ophthalmology;  Laterality: Right;   PARS PLANA  VITRECTOMY 27 GAUGE Right 02/01/2019   PARS PLANA VITRECTOMY 27 GAUGE Right 02/01/2019   Procedure: PARS PLANA VITRECTOMY 27 GAUGE, MEMBRANE PEEL, ENDOLASER, GAS INJECTION;  Surgeon: Hayden Pedro, MD;  Location: [place];  Service: Ophthalmology;  Laterality: Right;   REDUCTION MAMMAPLASTY Bilateral 1987   TOE AMPUTATION Left ~ 2011   "top of my 3rd toe"   TUBAL LIGATION  1973    There were no vitals filed for this visit.   Subjective Assessment - 12/24/21 0851     Subjective No new complaints. See's Bobby at [facility] on Friday and PCP on Thursday. Will have prosthesis adjusted on Friday and MD look at wound on Thursday. No falls or pain to report.    Pertinent History Type 2 DM, osteomyelitis, Charcot foot, abnormal EKG.    Patient Stated Goals Pt would like to strengthen her legs and be able to get up from chair easier.    Currently in Pain? No/denies                 [facility] Adult PT Treatment/Exercise - 12/24/21 0852       Transfers   Transfers Sit to Stand;Stand to Sit    Sit to Stand 5: Supervision    Stand to Sit 5: Supervision  Ambulation/Gait   Ambulation/Gait Yes    Ambulation/Gait Assistance 5: Supervision    Ambulation Distance (Feet) --   around clinic with prostheses only   Assistive device Straight cane;Prostheses;None    Gait Pattern Step-through pattern;Wide base of support    Ambulation Surface Level;Indoor      High Level Balance   High Level Balance Activities Side stepping;Marching forwards;Backward walking    High Level Balance Comments red/blue mats next to counter top: 3 laps each/each way with min guard to min assist, light touch to counter for balance at times and cues on posture/weight shifting/ex form      Neuro Re-ed    Neuro Re-ed Details  for strenthening/muscle re-ed: gait around track with prostheses only working on scanning all directions randomly with min guard to min assist, minor veering at times.      Knee/Hip Exercises: Aerobic    Other Aerobic SciFit Level 3.5 for 8 minutes with LE's with goal >/= 80 steps per minute for strengthening and activity tolerance.      Prosthetics   Prosthetic Care Comments  continues to use the Vibe wear liners    Current prosthetic wear tolerance (days/week)  daily    Current prosthetic wear tolerance (#hours/day)  all awake hours    Residual limb condition  pt reporting wound is healing well, no loner white along edges. Reports it's closing up.                 Balance Exercises - 12/24/21 0917       Balance Exercises: Standing   Standing Eyes Closed Wide (BOA);Head turns;Foam/compliant surface;Other reps (comment);30 secs;Limitations    Standing Eyes Closed Limitations on airex with feet hip width apart, no UE support for EC 30 sec's x 3 reps. cues on posture/weight shifting for balance assistance. then with fingertip support on sturdy surface for EC head movements left<>right, up<>down and diagonals both ways for ~10 reps each, min guard to min assist for balance.                  PT Short Term Goals - 12/12/21 1940       PT SHORT TERM GOAL #1   Title Pt will be independent with progressive HEP for strengthening and balance to continue gains on own.    Baseline PT continues to add to HEP    Time 4    Period Weeks    Status New    Target Date 01/09/22      PT SHORT TERM GOAL #2   Title Pt's will be able to tolerate wearing prosthesis all awake hours with no skin issues.    Baseline currently has dime sized sore on left residual limb    Time 4    Period Weeks    Status New    Target Date 01/09/22      PT SHORT TERM GOAL #3   Title Pt will increase Berg form 34 to >38/56 for improved balance.    Baseline 12/12/21 34/56    Time 4    Period Weeks    Status New    Target Date 01/09/22      PT SHORT TERM GOAL #4   Title Pt will be able to perform sit to stand x 5 from chair without hands for improved functional strength.    Baseline 12/12/21 3 reps from  mat but bracing legs on mat at times.    Time 4    Period Weeks    Status  New    Target Date 01/09/22               PT Long Term Goals - 12/12/21 1945       PT LONG TERM GOAL #1   Title Pt will increase DGI from 16 to >19/24 for improved balance and functional mobility. (LTGs due 02/07/22)    Baseline 12/12/21 16/24    Time 8    Period Weeks    Status New    Target Date 02/07/22      PT LONG TERM GOAL #2   Title Pt will ambulate >500' on varied level surfaces with cane versus no device for improved community mobility.    Time 8    Period Weeks    Status New    Target Date 02/07/22      PT LONG TERM GOAL #3   Title Pt will ambulate up/down 4 steps with 1 rail and cane, up/down ramp with cane and up/down curb with cane mod I for improved community access.    Baseline 12/12/21 4 steps with 1 rail and cane in step-to pattern supervision, ramp and curb with cane supervision    Time 8    Period Weeks    Status On-going    Target Date 02/07/22                   Plan - 12/24/21 0852     Clinical Impression Statement Today's skilled session continued to focus on LE strengthening and gait/balance with bil prostheses with decreased UE support. No issues noted or reported in session. The pt is making steady progress toward unmet goals and should benefit from continued PT to progress toward unmet goals.    Personal Factors and Comorbidities Comorbidity 3+    Comorbidities Type 2 DM, osteomyelitis, Charcot foot, abnormal EKG.    Examination-Activity Limitations Locomotion Level;Transfers;Stairs;Squat;Stand    Examination-Participation Restrictions Nurse, adult;Yard Work    [employer]chant [employer] officer Evolving/Moderate complexity    Rehab Potential Good    PT Frequency 2x / week    PT Duration 8 weeks    PT Treatment/Interventions ADLs/Self Care Home Management;DME Instruction;Gait training;Stair training;Functional mobility  training;Therapeutic activities;Therapeutic exercise;Balance training;Neuromuscular re-education;Manual techniques;Prosthetic Training;Passive range of motion;Patient/family education;Vestibular    PT Next Visit Plan Continue to monitor wound. continue to work on LE strengthening, balance with decreased UE support and gait/dynamic gait with prostheses only. continue with stair training.    PT Home Exercise Plan [id number]    Consulted and Agree with Plan of Care Patient             Patient will benefit from skilled therapeutic intervention in order to improve the following deficits and impairments:  Abnormal gait, Decreased mobility, Decreased strength, Decreased balance, Decreased knowledge of use of DME, Decreased range of motion, Impaired sensation, Prosthetic Dependency  Visit Diagnosis: Other abnormalities of gait and mobility  Muscle weakness (generalized)  Unsteadiness on feet     Problem List Patient Active Problem List   Diagnosis Date Noted   Dyslipidemia 10/18/2020   Vitreous hemorrhage of right eye due to diabetes mellitus ([place]) 02/01/2019   Preop cardiovascular exam 10/22/2018   Chest pain 10/22/2018   Vitreous hemorrhage, right eye ([place]) 09/21/2018   Proliferative diabetic retinopathy with macular edema ([place]) 09/21/2018   Non-pressure chronic ulcer of left calf, limited to breakdown of skin ([place]) 11/12/2016   Status post bilateral below knee amputation ([place]) 11/12/2016   Normocytic anemia 10/04/2016   Osteomyelitis ([place]) 10/04/2016  Finger infection 10/04/2016   Osteomyelitis of finger ([place]) 10/04/2016   Gastroparesis 11/02/2014   Diabetes mellitus due to underlying condition with other specified complication ([place])    Diabetic foot ([place])    Nausea & vomiting    Essential hypertension 10/25/2014   GERD (gastroesophageal reflux disease) 10/25/2014   Cellulitis 10/24/2014   Diabetes ([place]) 10/24/2014   Insulin dependent diabetes mellitus ([place]) 10/24/2014     Willow Ora, PTA, [facility] [address] [place], [place] [address] [phone] 12/24/21, 1:04 PM   Name: KELLIANNE EK MRN: [id number] Date of Birth: 14-Apr-1951

## 2021-12-26 ENCOUNTER — Other Ambulatory Visit: Payer: Self-pay

## 2021-12-26 ENCOUNTER — Ambulatory Visit: Payer: Medicare Other | Admitting: Physical Therapy

## 2021-12-26 ENCOUNTER — Encounter: Payer: Self-pay | Admitting: Physical Therapy

## 2021-12-26 DIAGNOSIS — R293 Abnormal posture: Secondary | ICD-10-CM

## 2021-12-26 DIAGNOSIS — R2689 Other abnormalities of gait and mobility: Secondary | ICD-10-CM

## 2021-12-26 DIAGNOSIS — M6281 Muscle weakness (generalized): Secondary | ICD-10-CM

## 2021-12-26 DIAGNOSIS — R2681 Unsteadiness on feet: Secondary | ICD-10-CM

## 2021-12-26 NOTE — Therapy (Addendum)
[facility] [address] [place], [place], [address] Phone: [phone]   Fax:  [phone]  Physical Therapy Treatment  Patient Details  Name: Diana Coleman MRN: [id number] Date of Birth: 1951/09/24 Referring Provider (PT): Keith Rake   Encounter Date: 12/26/2021   PT End of Session - 12/26/21 0720     Visit Number 14    Number of Visits 26    Date for PT Re-Evaluation 02/07/22    Authorization Type UHC medicare so 10th visit progress note    Progress Note Due on Visit 20    PT Start Time 0718    PT Stop Time 0800    PT Time Calculation (min) 42 min    Equipment Utilized During Treatment Gait belt    Activity Tolerance Patient tolerated treatment well    Behavior During Therapy [facility] for tasks assessed/performed             Past Medical History:  Diagnosis Date   Cellulitis of foot, left 10/24/2014   hx/notes 10/24/2014   Charcot foot due to diabetes mellitus ([place])    Hepatitis    Hep B    Hypertension    Osteomyelitis of right foot ([place])    hx/notes 10/24/2014   Proliferative diabetic retinopathy ([place])    right eye and vitreous hemorrhage   Type II diabetes mellitus ([place])    Since 1998    Past Surgical History:  Procedure Laterality Date   ABOVE KNEE LEG AMPUTATION Right 2004   AMPUTATION Left 10/29/2014   Procedure: AMPUTATION BELOW KNEE - LEFT;  Surgeon: Newt Minion, MD;  Location: [place] [facility];  Service: Orthopedics;  Laterality: Left;   CATARACT EXTRACTION W/ INTRAOCULAR LENS  IMPLANT, BILATERAL     FOOT SURGERY Left 1980's   "ulcer removed"   I & D EXTREMITY Right 10/04/2016   Procedure: IRRIGATION AND DEBRIDEMENT MIDDLE FINGER AND AMPUTATION;  Surgeon: Iran Planas, MD;  Location: [facility];  Service: Orthopedics;  Laterality: Right;   MEMBRANE PEEL Right 02/01/2019   Procedure: Carvel Getting;  Surgeon: Hayden Pedro, MD;  Location: [place];  Service: Ophthalmology;  Laterality: Right;   PARS PLANA  VITRECTOMY 27 GAUGE Right 02/01/2019   PARS PLANA VITRECTOMY 27 GAUGE Right 02/01/2019   Procedure: PARS PLANA VITRECTOMY 27 GAUGE, MEMBRANE PEEL, ENDOLASER, GAS INJECTION;  Surgeon: Hayden Pedro, MD;  Location: [place];  Service: Ophthalmology;  Laterality: Right;   REDUCTION MAMMAPLASTY Bilateral 1987   TOE AMPUTATION Left ~ 2011   "top of my 3rd toe"   TUBAL LIGATION  1973    There were no vitals filed for this visit.   Subjective Assessment - 12/26/21 0719     Subjective No new complaints. No falls or pain to report. Reports the wound has healed, not wearing the Vivewear liner at this time.    Pertinent History Type 2 DM, osteomyelitis, Charcot foot, abnormal EKG.    Patient Stated Goals Pt would like to strengthen her legs and be able to get up from chair easier.    Currently in Pain? No/denies                               [facility] Adult PT Treatment/Exercise - 12/26/21 0721       Transfers   Transfers Sit to Stand;Stand to Sit    Sit to Stand 5: Supervision    Stand to Sit 5: Supervision  Ambulation/Gait   Ambulation/Gait Yes    Ambulation/Gait Assistance 5: Supervision    Assistive device Straight cane;Prostheses;None    Gait Pattern Step-through pattern;Wide base of support    Ambulation Surface Level;Indoor    Stairs Yes    Stairs Assistance 5: Supervision;4: Min guard    Stairs Assistance Details (indicate cue type and reason) 1st rep: step to pattern with leading with left LE up, right LE down. 2cd rep:    Stair Management Technique Two rails;Alternating pattern;Step to pattern;Forwards    Number of Stairs 4   x3 reps   Height of Stairs 6      Neuro Re-ed    Neuro Re-ed Details  for strenthening/muscle re-ed: gait around track with prostheses only working on scanning all directions randomly with min guard to min assist, veering and decreased gait speed noted with this activity.      Knee/Hip Exercises: Aerobic   Other Aerobic SciFit Level  4.0 for 8 minutes with LE's with goal >/= 80 steps per minute for strengthening and activity tolerance.                 Balance Exercises - 12/26/21 0738       Balance Exercises: Standing   Rockerboard Anterior/posterior;Lateral;EO;30 seconds;Other reps (comment);Limitations;Intermittent UE support    Rockerboard Limitations performed both ways on balance board: alternating UE raises, bil UE raises, no UE support for 30 sec's x 3 reps. Min guard to min assist for balance.                 PT Short Term Goals - 12/12/21 1940       PT SHORT TERM GOAL #1   Title Pt will be independent with progressive HEP for strengthening and balance to continue gains on own.    Baseline PT continues to add to HEP    Time 4    Period Weeks    Status New    Target Date 01/09/22      PT SHORT TERM GOAL #2   Title Pt's will be able to tolerate wearing prosthesis all awake hours with no skin issues.    Baseline currently has dime sized sore on left residual limb    Time 4    Period Weeks    Status New    Target Date 01/09/22      PT SHORT TERM GOAL #3   Title Pt will increase Berg form 34 to >38/56 for improved balance.    Baseline 12/12/21 34/56    Time 4    Period Weeks    Status New    Target Date 01/09/22      PT SHORT TERM GOAL #4   Title Pt will be able to perform sit to stand x 5 from chair without hands for improved functional strength.    Baseline 12/12/21 3 reps from mat but bracing legs on mat at times.    Time 4    Period Weeks    Status New    Target Date 01/09/22               PT Long Term Goals - 12/12/21 1945       PT LONG TERM GOAL #1   Title Pt will increase DGI from 16 to >19/24 for improved balance and functional mobility. (LTGs due 02/07/22)    Baseline 12/12/21 16/24    Time 8    Period Weeks    Status New    Target Date 02/07/22  PT LONG TERM GOAL #2   Title Pt will ambulate >500' on varied level surfaces with cane versus no device  for improved community mobility.    Time 8    Period Weeks    Status New    Target Date 02/07/22      PT LONG TERM GOAL #3   Title Pt will ambulate up/down 4 steps with 1 rail and cane, up/down ramp with cane and up/down curb with cane mod I for improved community access.    Baseline 12/12/21 4 steps with 1 rail and cane in step-to pattern supervision, ramp and curb with cane supervision    Time 8    Period Weeks    Status On-going    Target Date 02/07/22              12/26/21 0720  Plan  Clinical Impression Statement Today's skilled session continued to focus on strengthening, stair training and balance with bil prostheses with rest breaks taken as needed. No other issues noted or reported in session. The pt is making steady progress toward goals and should benefit from continued PT to progress toward unmet goals.  Personal Factors and Comorbidities Comorbidity 3+  Comorbidities Type 2 DM, osteomyelitis, Charcot foot, abnormal EKG.  Examination-Activity Limitations Locomotion Level;Transfers;Stairs;Squat;Stand  Examination-Participation Restrictions Nurse, adult;Yard Work  Pt will benefit from skilled therapeutic intervention in order to improve on the following deficits Abnormal gait;Decreased mobility;Decreased strength;Decreased balance;Decreased knowledge of use of DME;Decreased range of motion;Impaired sensation;Prosthetic Dependency  Stability/Clinical Decision Making Evolving/Moderate complexity  Rehab Potential Good  PT Frequency 2x / week  PT Duration 8 weeks  PT Treatment/Interventions ADLs/Self Care Home Management;DME Instruction;Gait training;Stair training;Functional mobility training;Therapeutic activities;Therapeutic exercise;Balance training;Neuromuscular re-education;Manual techniques;Prosthetic Training;Passive range of motion;Patient/family education;Vestibular  PT Next Visit Plan continue to work on LE strengthening, balance with decreased  UE support and gait/dynamic gait with prostheses only. continue with stair training.  PT Home Exercise Plan [email or web address]  Consulted and Agree with Plan of Care Patient           Patient will benefit from skilled therapeutic intervention in order to improve the following deficits and impairments:  Abnormal gait, Decreased mobility, Decreased strength, Decreased balance, Decreased knowledge of use of DME, Decreased range of motion, Impaired sensation, Prosthetic Dependency  Visit Diagnosis: Other abnormalities of gait and mobility  Muscle weakness (generalized)  Unsteadiness on feet  Abnormal posture     Problem List Patient Active Problem List   Diagnosis Date Noted   Dyslipidemia 10/18/2020   Vitreous hemorrhage of right eye due to diabetes mellitus ([place]) 02/01/2019   Preop cardiovascular exam 10/22/2018   Chest pain 10/22/2018   Vitreous hemorrhage, right eye ([place]) 09/21/2018   Proliferative diabetic retinopathy with macular edema ([place]) 09/21/2018   Non-pressure chronic ulcer of left calf, limited to breakdown of skin ([place]) 11/12/2016   Status post bilateral below knee amputation ([place]) 11/12/2016   Normocytic anemia 10/04/2016   Osteomyelitis ([place]) 10/04/2016   Finger infection 10/04/2016   Osteomyelitis of finger ([place]) 10/04/2016   Gastroparesis 11/02/2014   Diabetes mellitus due to underlying condition with other specified complication ([place])    Diabetic foot ([facility])    Nausea & vomiting    Essential hypertension 10/25/2014   GERD (gastroesophageal reflux disease) 10/25/2014   Cellulitis 10/24/2014   Diabetes ([place]) 10/24/2014   Insulin dependent diabetes mellitus ([place]) 10/24/2014    Willow Ora, PTA 12/26/2021, 8:28 AM  [facility] [address]  [place], [place], [address] Phone: [phone]   Fax:  [phone]  Name: Diana Coleman MRN: [id number] Date of Birth: June 16, 1951

## 2021-12-31 ENCOUNTER — Other Ambulatory Visit: Payer: Self-pay

## 2021-12-31 ENCOUNTER — Ambulatory Visit: Payer: Medicare Other | Admitting: Physical Therapy

## 2021-12-31 ENCOUNTER — Ambulatory Visit: Payer: Medicare Other

## 2021-12-31 DIAGNOSIS — R2681 Unsteadiness on feet: Secondary | ICD-10-CM

## 2021-12-31 DIAGNOSIS — R2689 Other abnormalities of gait and mobility: Secondary | ICD-10-CM

## 2021-12-31 DIAGNOSIS — M6281 Muscle weakness (generalized): Secondary | ICD-10-CM

## 2021-12-31 NOTE — Therapy (Signed)
[facility] [address] [place], [place], [address] Phone: [phone]   Fax:  [phone]  Physical Therapy Treatment  Patient Details  Name: Diana Coleman MRN: [id number] Date of Birth: [birth year] Referring Provider (PT): Keith Rake   Encounter Date: 12/31/2021   PT End of Session - 12/31/21 0850     Visit Number 15    Number of Visits 26    Date for PT Re-Evaluation 02/07/22    Authorization Type UHC medicare so 10th visit progress note    Progress Note Due on Visit 20    PT Start Time 0848    PT Stop Time 0928    PT Time Calculation (min) 40 min    Equipment Utilized During Treatment Gait belt    Activity Tolerance Patient tolerated treatment well    Behavior During Therapy [facility] for tasks assessed/performed             Past Medical History:  Diagnosis Date   Cellulitis of foot, left 10/24/2014   hx/notes 10/24/2014   Charcot foot due to diabetes mellitus ([place])    Hepatitis    Hep B    Hypertension    Osteomyelitis of right foot ([place])    hx/notes 10/24/2014   Proliferative diabetic retinopathy ([place])    right eye and vitreous hemorrhage   Type II diabetes mellitus ([place])    Since 1998    Past Surgical History:  Procedure Laterality Date   ABOVE KNEE LEG AMPUTATION Right 2004   AMPUTATION Left 10/29/2014   Procedure: AMPUTATION BELOW KNEE - LEFT;  Surgeon: Newt Minion, MD;  Location: [facility];  Service: Orthopedics;  Laterality: Left;   CATARACT EXTRACTION W/ INTRAOCULAR LENS  IMPLANT, BILATERAL     FOOT SURGERY Left 1980's   "ulcer removed"   I & D EXTREMITY Right 10/04/2016   Procedure: IRRIGATION AND DEBRIDEMENT MIDDLE FINGER AND AMPUTATION;  Surgeon: Iran Planas, MD;  Location: [facility];  Service: Orthopedics;  Laterality: Right;   MEMBRANE PEEL Right 02/01/2019   Procedure: Carvel Getting;  Surgeon: Hayden Pedro, MD;  Location: [facility];  Service: Ophthalmology;  Laterality: Right;   PARS PLANA  VITRECTOMY 27 GAUGE Right 02/01/2019   PARS PLANA VITRECTOMY 27 GAUGE Right 02/01/2019   Procedure: PARS PLANA VITRECTOMY 27 GAUGE, MEMBRANE PEEL, ENDOLASER, GAS INJECTION;  Surgeon: Hayden Pedro, MD;  Location: [facility];  Service: Ophthalmology;  Laterality: Right;   REDUCTION MAMMAPLASTY Bilateral 1987   TOE AMPUTATION Left ~ 2011   "top of my 3rd toe"   TUBAL LIGATION  1973    There were no vitals filed for this visit.   Subjective Assessment - 12/31/21 0851     Subjective Pt reports that she is doing well. Has not purchased the sweat block due to price. Suggested trying antipersipant. Saw Dr. Manuella Ghazi and he was happy with how she's doing. Bloodwork is improving and weight is coming down. Pt also saw Bobby on Friday and he added some pads to both prosthesis prosthesis. Pt reports it is feeling goot since then. Denies any skin issues now. PT noted that pt's right pointer finger is swollen at tip and dark in color with nail almost coming off. Back side has opened up.    Pertinent History Type 2 DM, osteomyelitis, Charcot foot, abnormal EKG.    Patient Stated Goals Pt would like to strengthen her legs and be able to get up from chair easier.    Currently in Pain?  No/denies                               [facility] Adult PT Treatment/Exercise - 12/31/21 0855       Transfers   Transfers Sit to Stand;Stand to Sit    Sit to Stand 5: Supervision    Sit to Stand Details Verbal cues for technique    Stand to Sit 5: Supervision    Comments Performed x 5 from lowered mat with BUE with chair in front to mimic church working on keeping feet closer together. x 5 with 1 UE support and then  x 5 with mat elevated slightly with no UE support.      Ambulation/Gait   Ambulation/Gait Yes    Ambulation/Gait Assistance 5: Supervision;4: Min guard    Ambulation/Gait Assistance Details Working on trying to decrease BOS some.    Ambulation Distance (Feet) 345 Feet    Assistive device  None;Prostheses    Gait Pattern Step-through pattern;Wide base of support    Ambulation Surface Level;Indoor    Ramp 5: Supervision    Ramp Details (indicate cue type and reason) x 3 bouts with cane. Pt was cued to keep weight anterior with ascent and posterior with descent. Pt used some momentum to help with ascent.    Curb 5: Supervision    Curb Details (indicate cue type and reason) x 2 with cane. Pt was able to demonstrate carryover with being sure toes of lowering leg were just off edge of step before stepping down      Neuro Re-ed    Neuro Re-ed Details  Gait with cane around 4 cones: weaving in and out  x 6 bouts, tapping cone with each foot prior and then stepping over x 4 bouts CGA/min assist. Verbal cues to watch foot placement with weaving and with SLS for tapping to squeeze bottom to help with stability. Pt more challenged with SLS on right. Reported back a little sore after.      Prosthetics   Prosthetic Care Comments  Pt has 1 ply on 1 leg and 3 ply on the other. Pt reports that sweat block was too pricey right now and MD could not right a prescription. Discussed using secret clinical strength which she uses anyways as antiperspirant. Advised to put on at night so can sink in and do not want any residue with liner. To still check skin and dry every 2-3 hours for safety.    Current prosthetic wear tolerance (days/week)  daily    Current prosthetic wear tolerance (#hours/day)  all awake hours    Residual limb condition  Pt reports skin intact    Education Provided Skin check    Person(s) Educated Patient    Education Method Explanation    Education Method Verbalized understanding                     PT Education - 12/31/21 [phone]     Education Details Pt was advised to contact PCP about right index finger and get checked out ASAP    Person(s) Educated Patient    Methods Explanation    Comprehension Verbalized understanding              PT Short Term Goals -  12/12/21 1940       PT SHORT TERM GOAL #1   Title Pt will be independent with progressive HEP for strengthening and balance to continue gains  on own.    Baseline PT continues to add to HEP    Time 4    Period Weeks    Status New    Target Date 01/09/22      PT SHORT TERM GOAL #2   Title Pt's will be able to tolerate wearing prosthesis all awake hours with no skin issues.    Baseline currently has dime sized sore on left residual limb    Time 4    Period Weeks    Status New    Target Date 01/09/22      PT SHORT TERM GOAL #3   Title Pt will increase Berg form 34 to >38/56 for improved balance.    Baseline 12/12/21 34/56    Time 4    Period Weeks    Status New    Target Date 01/09/22      PT SHORT TERM GOAL #4   Title Pt will be able to perform sit to stand x 5 from chair without hands for improved functional strength.    Baseline 12/12/21 3 reps from mat but bracing legs on mat at times.    Time 4    Period Weeks    Status New    Target Date 01/09/22               PT Long Term Goals - 12/12/21 1945       PT LONG TERM GOAL #1   Title Pt will increase DGI from 16 to >19/24 for improved balance and functional mobility. (LTGs due 02/07/22)    Baseline 12/12/21 16/24    Time 8    Period Weeks    Status New    Target Date 02/07/22      PT LONG TERM GOAL #2   Title Pt will ambulate >500' on varied level surfaces with cane versus no device for improved community mobility.    Time 8    Period Weeks    Status New    Target Date 02/07/22      PT LONG TERM GOAL #3   Title Pt will ambulate up/down 4 steps with 1 rail and cane, up/down ramp with cane and up/down curb with cane mod I for improved community access.    Baseline 12/12/21 4 steps with 1 rail and cane in step-to pattern supervision, ramp and curb with cane supervision    Time 8    Period Weeks    Status On-going    Target Date 02/07/22                   Plan - 12/31/21 1027     Clinical  Impression Statement PT continued to focus on strengthening and balance. Worked on decreasing BOS with sit to stands and during gait which pt was able to do after practice with less support.    Personal Factors and Comorbidities Comorbidity 3+    Comorbidities Type 2 DM, osteomyelitis, Charcot foot, abnormal EKG.    Examination-Activity Limitations Locomotion Level;Transfers;Stairs;Squat;Stand    Examination-Participation Restrictions Nurse, adult;Yard Work    Merchant [employer] officer Evolving/Moderate complexity    Rehab Potential Good    PT Frequency 2x / week    PT Duration 8 weeks    PT Treatment/Interventions ADLs/Self Care Home Management;DME Instruction;Gait training;Stair training;Functional mobility training;Therapeutic activities;Therapeutic exercise;Balance training;Neuromuscular re-education;Manual techniques;Prosthetic Training;Passive range of motion;Patient/family education;Vestibular    PT Next Visit Plan Did she see someone about her finger? continue to work on LE strengthening, balance with decreased  UE support and gait/dynamic gait with prostheses only. continue with stair training.    PT Home Exercise Plan [id number]    Consulted and Agree with Plan of Care Patient             Patient will benefit from skilled therapeutic intervention in order to improve the following deficits and impairments:  Abnormal gait, Decreased mobility, Decreased strength, Decreased balance, Decreased knowledge of use of DME, Decreased range of motion, Impaired sensation, Prosthetic Dependency  Visit Diagnosis: Other abnormalities of gait and mobility  Muscle weakness (generalized)  Unsteadiness on feet     Problem List Patient Active Problem List   Diagnosis Date Noted   Dyslipidemia 10/18/2020   Vitreous hemorrhage of right eye due to diabetes mellitus ([place]) 02/01/2019   Preop cardiovascular exam 10/22/2018   Chest pain 10/22/2018   Vitreous  hemorrhage, right eye ([place]) 09/21/2018   Proliferative diabetic retinopathy with macular edema ([place]) 09/21/2018   Non-pressure chronic ulcer of left calf, limited to breakdown of skin ([place]) 11/12/2016   Status post bilateral below knee amputation ([place]) 11/12/2016   Normocytic anemia 10/04/2016   Osteomyelitis ([place]) 10/04/2016   Finger infection 10/04/2016   Osteomyelitis of finger ([place]) 10/04/2016   Gastroparesis 11/02/2014   Diabetes mellitus due to underlying condition with other specified complication ([place])    Diabetic foot ([facility])    Nausea & vomiting    Essential hypertension 10/25/2014   GERD (gastroesophageal reflux disease) 10/25/2014   Cellulitis 10/24/2014   Diabetes ([place]) 10/24/2014   Insulin dependent diabetes mellitus ([place]) 10/24/2014    Electa Sniff, PT, DPT, NCS 12/31/2021, 10:17 AM  [facility] [address] [place], [place], [address] Phone: [phone]   Fax:  [phone]  Name: Diana Coleman MRN: [id number] Date of Birth: [birth year]

## 2022-01-02 ENCOUNTER — Other Ambulatory Visit: Payer: Self-pay

## 2022-01-02 ENCOUNTER — Ambulatory Visit: Payer: Medicare Other

## 2022-01-02 DIAGNOSIS — R2681 Unsteadiness on feet: Secondary | ICD-10-CM

## 2022-01-02 DIAGNOSIS — R2689 Other abnormalities of gait and mobility: Secondary | ICD-10-CM

## 2022-01-02 DIAGNOSIS — M869 Osteomyelitis, unspecified: Secondary | ICD-10-CM | POA: Diagnosis not present

## 2022-01-02 DIAGNOSIS — E1152 Type 2 diabetes mellitus with diabetic peripheral angiopathy with gangrene: Secondary | ICD-10-CM | POA: Diagnosis not present

## 2022-01-02 NOTE — Therapy (Signed)
[facility] [address] [place], [place], [address] Phone: [phone]   Fax:  [phone]  Physical Therapy Treatment  Patient Details  Name: Diana Coleman MRN: [id number] Date of Birth: 06/01/1951 Referring Provider (PT): Keith Rake   Encounter Date: 01/02/2022   PT End of Session - 01/02/22 1106     Visit Number 16    Number of Visits 26    Date for PT Re-Evaluation 02/07/22    Authorization Type UHC medicare so 10th visit progress note    Progress Note Due on Visit 20    PT Start Time 1105    PT Stop Time 1145    PT Time Calculation (min) 40 min    Equipment Utilized During Treatment Gait belt    Activity Tolerance Patient tolerated treatment well    Behavior During Therapy [facility] for tasks assessed/performed             Past Medical History:  Diagnosis Date   Cellulitis of foot, left 10/24/2014   hx/notes 10/24/2014   Charcot foot due to diabetes mellitus ([place])    Hepatitis    Hep B    Hypertension    Osteomyelitis of right foot ([place])    hx/notes 10/24/2014   Proliferative diabetic retinopathy ([place])    right eye and vitreous hemorrhage   Type II diabetes mellitus ([place])    Since 1998    Past Surgical History:  Procedure Laterality Date   ABOVE KNEE LEG AMPUTATION Right 2004   AMPUTATION Left 10/29/2014   Procedure: AMPUTATION BELOW KNEE - LEFT;  Surgeon: Newt Minion, MD;  Location: [facility];  Service: Orthopedics;  Laterality: Left;   CATARACT EXTRACTION W/ INTRAOCULAR LENS  IMPLANT, BILATERAL     FOOT SURGERY Left 1980's   "ulcer removed"   I & D EXTREMITY Right 10/04/2016   Procedure: IRRIGATION AND DEBRIDEMENT MIDDLE FINGER AND AMPUTATION;  Surgeon: Iran Planas, MD;  Location: [facility];  Service: Orthopedics;  Laterality: Right;   MEMBRANE PEEL Right 02/01/2019   Procedure: Carvel Getting;  Surgeon: Hayden Pedro, MD;  Location: [facility];  Service: Ophthalmology;  Laterality: Right;   PARS PLANA  VITRECTOMY 27 GAUGE Right 02/01/2019   PARS PLANA VITRECTOMY 27 GAUGE Right 02/01/2019   Procedure: PARS PLANA VITRECTOMY 27 GAUGE, MEMBRANE PEEL, ENDOLASER, GAS INJECTION;  Surgeon: Hayden Pedro, MD;  Location: [facility];  Service: Ophthalmology;  Laterality: Right;   REDUCTION MAMMAPLASTY Bilateral 1987   TOE AMPUTATION Left ~ 2011   "top of my 3rd toe"   TUBAL LIGATION  1973    There were no vitals filed for this visit.   Subjective Assessment - 01/02/22 1107     Subjective Pt reports that she has not followed up about finger yet. Is still swollen, red, warm with dark fingernail and opened on the back. PT stressed importance of getting looked at ASAP as if infected can spread quickly. Has her rollator as was carrying things and forgot cane.    Pertinent History Type 2 DM, osteomyelitis, Charcot foot, abnormal EKG.    Patient Stated Goals Pt would like to strengthen her legs and be able to get up from chair easier.    Currently in Pain? No/denies                               [facility] Adult PT Treatment/Exercise - 01/02/22 1109  Ambulation/Gait   Ambulation/Gait Yes    Ambulation/Gait Assistance 5: Supervision    Ambulation/Gait Assistance Details Focus on narrowing BOS    Ambulation Distance (Feet) 230 Feet    Assistive device Prostheses;Straight cane   with quad tip   Gait Pattern Step-through pattern;Wide base of support    Ambulation Surface Level;Indoor    Stairs Yes    Stairs Assistance 5: Supervision;4: Min guard    Stairs Assistance Details (indicate cue type and reason) 1 rail up, 2 rails down. Reminder to get front of toe off step to allow prosthesis to rock forward.    Stair Management Technique One rail Right;Two rails;Alternating pattern    Number of Stairs 12    Height of Stairs 6      Neuro Re-ed    Neuro Re-ed Details  In // bars: staggered stance 30 sec x 2 each position. Pt more challenged with RLE posterior. Feet shoulder width apart  raising 2.2# med ball overhead x 10 then side to side to 10. Standing on blue mat: moving ball in diagonals across body x 10 then performed x 10 with feet in staggered stance each position CGA. Marching over blue mat with 1 UE support x 6 bouts, side stepping over blue mat with fingertip support x 4 bouts. Stepping on rockerboard positioned ant/post with 1 UE support x 5 each leg. Sit to stand from chair with UE support with feet hip width apart. Verbal cues to lean foreward and not through back at legs.      Prosthetics   Prosthetic Care Comments  Pt applied the Secret antipersirant at night and really seems to be helping.    Current prosthetic wear tolerance (days/week)  daily    Current prosthetic wear tolerance (#hours/day)  all awake hours                     PT Education - 01/02/22 1919     Education Details Instruction again to call PCP about finger and get looked at ASAP. Discussed risks if there is an infection    Person(s) Educated Patient    Methods Explanation    Comprehension Verbalized understanding              PT Short Term Goals - 12/12/21 1940       PT SHORT TERM GOAL #1   Title Pt will be independent with progressive HEP for strengthening and balance to continue gains on own.    Baseline PT continues to add to HEP    Time 4    Period Weeks    Status New    Target Date 01/09/22      PT SHORT TERM GOAL #2   Title Pt's will be able to tolerate wearing prosthesis all awake hours with no skin issues.    Baseline currently has dime sized sore on left residual limb    Time 4    Period Weeks    Status New    Target Date 01/09/22      PT SHORT TERM GOAL #3   Title Pt will increase Berg form 34 to >38/56 for improved balance.    Baseline 12/12/21 34/56    Time 4    Period Weeks    Status New    Target Date 01/09/22      PT SHORT TERM GOAL #4   Title Pt will be able to perform sit to stand x 5 from chair without hands for improved functional  strength.  Baseline 12/12/21 3 reps from mat but bracing legs on mat at times.    Time 4    Period Weeks    Status New    Target Date 01/09/22               PT Long Term Goals - 12/12/21 1945       PT LONG TERM GOAL #1   Title Pt will increase DGI from 16 to >19/24 for improved balance and functional mobility. (LTGs due 02/07/22)    Baseline 12/12/21 16/24    Time 8    Period Weeks    Status New    Target Date 02/07/22      PT LONG TERM GOAL #2   Title Pt will ambulate >500' on varied level surfaces with cane versus no device for improved community mobility.    Time 8    Period Weeks    Status New    Target Date 02/07/22      PT LONG TERM GOAL #3   Title Pt will ambulate up/down 4 steps with 1 rail and cane, up/down ramp with cane and up/down curb with cane mod I for improved community access.    Baseline 12/12/21 4 steps with 1 rail and cane in step-to pattern supervision, ramp and curb with cane supervision    Time 8    Period Weeks    Status On-going    Target Date 02/07/22                   Plan - 01/02/22 1921     Clinical Impression Statement PT continued to work more on balance with narrow BOS and on compliant surfaces adding in self pertubations. Pt did improved with practice but were challenging.    Personal Factors and Comorbidities Comorbidity 3+    Comorbidities Type 2 DM, osteomyelitis, Charcot foot, abnormal EKG.    Examination-Activity Limitations Locomotion Level;Transfers;Stairs;Squat;Stand    Examination-Participation Restrictions Nurse, adult;Yard Work    Merchant [employer] officer Evolving/Moderate complexity    Rehab Potential Good    PT Frequency 2x / week    PT Duration 8 weeks    PT Treatment/Interventions ADLs/Self Care Home Management;DME Instruction;Gait training;Stair training;Functional mobility training;Therapeutic activities;Therapeutic exercise;Balance training;Neuromuscular  re-education;Manual techniques;Prosthetic Training;Passive range of motion;Patient/family education;Vestibular    PT Next Visit Plan Did she see someone about her finger? continue to work on LE strengthening, balance with decreased UE support and gait/dynamic gait with prostheses only. continue with stair training.    PT Home Exercise Plan [id number]    Consulted and Agree with Plan of Care Patient             Patient will benefit from skilled therapeutic intervention in order to improve the following deficits and impairments:  Abnormal gait, Decreased mobility, Decreased strength, Decreased balance, Decreased knowledge of use of DME, Decreased range of motion, Impaired sensation, Prosthetic Dependency  Visit Diagnosis: Other abnormalities of gait and mobility  Unsteadiness on feet     Problem List Patient Active Problem List   Diagnosis Date Noted   Dyslipidemia 10/18/2020   Vitreous hemorrhage of right eye due to diabetes mellitus ([place]) 02/01/2019   Preop cardiovascular exam 10/22/2018   Chest pain 10/22/2018   Vitreous hemorrhage, right eye ([place]) 09/21/2018   Proliferative diabetic retinopathy with macular edema ([place]) 09/21/2018   Non-pressure chronic ulcer of left calf, limited to breakdown of skin ([place]) 11/12/2016   Status post bilateral below knee amputation ([place]) 11/12/2016   Normocytic anemia  10/04/2016   Osteomyelitis ([place]) 10/04/2016   Finger infection 10/04/2016   Osteomyelitis of finger ([place]) 10/04/2016   Gastroparesis 11/02/2014   Diabetes mellitus due to underlying condition with other specified complication ([place])    Diabetic foot ([place])    Nausea & vomiting    Essential hypertension 10/25/2014   GERD (gastroesophageal reflux disease) 10/25/2014   Cellulitis 10/24/2014   Diabetes ([place]) 10/24/2014   Insulin dependent diabetes mellitus ([place]) 10/24/2014    Electa Sniff, PT, DPT, NCS 01/02/2022, 7:23 PM  [facility] [address] [place], [place], [address] Phone: [phone]   Fax:  [phone]  Name: Diana Coleman MRN: [id number] Date of Birth: [birth year]

## 2022-01-05 ENCOUNTER — Other Ambulatory Visit: Payer: Self-pay

## 2022-01-05 ENCOUNTER — Inpatient Hospital Stay (HOSPITAL_COMMUNITY): Payer: Medicare Other | Admitting: Certified Registered Nurse Anesthetist

## 2022-01-05 ENCOUNTER — Encounter (HOSPITAL_COMMUNITY)
Admission: EM | Disposition: A | Discharge status: Home / Self Care | Payer: Self-pay | Source: Home / Self Care | Attending: Internal Medicine

## 2022-01-05 ENCOUNTER — Inpatient Hospital Stay (HOSPITAL_COMMUNITY)
Admission: EM | Admit: 2022-01-05 | Discharge: 2022-01-08 | DRG: 255 | Disposition: A | Discharge status: Home / Self Care | Payer: Medicare Other | Attending: Internal Medicine | Admitting: Internal Medicine

## 2022-01-05 ENCOUNTER — Emergency Department (HOSPITAL_COMMUNITY): Payer: Medicare Other

## 2022-01-05 ENCOUNTER — Encounter (HOSPITAL_COMMUNITY): Payer: Self-pay | Admitting: *Deleted

## 2022-01-05 DIAGNOSIS — Z881 Allergy status to other antibiotic agents status: Secondary | ICD-10-CM

## 2022-01-05 DIAGNOSIS — Z89511 Acquired absence of right leg below knee: Secondary | ICD-10-CM | POA: Diagnosis not present

## 2022-01-05 DIAGNOSIS — E113599 Type 2 diabetes mellitus with proliferative diabetic retinopathy without macular edema, unspecified eye: Secondary | ICD-10-CM | POA: Diagnosis present

## 2022-01-05 DIAGNOSIS — Z89611 Acquired absence of right leg above knee: Secondary | ICD-10-CM | POA: Diagnosis not present

## 2022-01-05 DIAGNOSIS — E1165 Type 2 diabetes mellitus with hyperglycemia: Secondary | ICD-10-CM | POA: Diagnosis present

## 2022-01-05 DIAGNOSIS — E669 Obesity, unspecified: Secondary | ICD-10-CM | POA: Diagnosis present

## 2022-01-05 DIAGNOSIS — Z89512 Acquired absence of left leg below knee: Secondary | ICD-10-CM | POA: Diagnosis not present

## 2022-01-05 DIAGNOSIS — E11610 Type 2 diabetes mellitus with diabetic neuropathic arthropathy: Secondary | ICD-10-CM | POA: Diagnosis present

## 2022-01-05 DIAGNOSIS — Z602 Problems related to living alone: Secondary | ICD-10-CM | POA: Diagnosis present

## 2022-01-05 DIAGNOSIS — E1152 Type 2 diabetes mellitus with diabetic peripheral angiopathy with gangrene: Secondary | ICD-10-CM | POA: Diagnosis present

## 2022-01-05 DIAGNOSIS — Z89021 Acquired absence of right finger(s): Secondary | ICD-10-CM | POA: Diagnosis not present

## 2022-01-05 DIAGNOSIS — Z885 Allergy status to narcotic agent status: Secondary | ICD-10-CM | POA: Diagnosis not present

## 2022-01-05 DIAGNOSIS — E1169 Type 2 diabetes mellitus with other specified complication: Secondary | ICD-10-CM | POA: Diagnosis present

## 2022-01-05 DIAGNOSIS — R739 Hyperglycemia, unspecified: Secondary | ICD-10-CM | POA: Diagnosis not present

## 2022-01-05 DIAGNOSIS — E785 Hyperlipidemia, unspecified: Secondary | ICD-10-CM | POA: Diagnosis present

## 2022-01-05 DIAGNOSIS — Z20822 Contact with and (suspected) exposure to covid-19: Secondary | ICD-10-CM | POA: Diagnosis present

## 2022-01-05 DIAGNOSIS — I152 Hypertension secondary to endocrine disorders: Secondary | ICD-10-CM | POA: Diagnosis not present

## 2022-01-05 DIAGNOSIS — E1142 Type 2 diabetes mellitus with diabetic polyneuropathy: Secondary | ICD-10-CM | POA: Diagnosis present

## 2022-01-05 DIAGNOSIS — I10 Essential (primary) hypertension: Secondary | ICD-10-CM | POA: Diagnosis not present

## 2022-01-05 DIAGNOSIS — D649 Anemia, unspecified: Secondary | ICD-10-CM | POA: Diagnosis not present

## 2022-01-05 DIAGNOSIS — Z794 Long term (current) use of insulin: Secondary | ICD-10-CM

## 2022-01-05 DIAGNOSIS — M869 Osteomyelitis, unspecified: Secondary | ICD-10-CM | POA: Diagnosis present

## 2022-01-05 DIAGNOSIS — Z87891 Personal history of nicotine dependence: Secondary | ICD-10-CM

## 2022-01-05 DIAGNOSIS — Z79899 Other long term (current) drug therapy: Secondary | ICD-10-CM

## 2022-01-05 DIAGNOSIS — Z8349 Family history of other endocrine, nutritional and metabolic diseases: Secondary | ICD-10-CM

## 2022-01-05 DIAGNOSIS — Z6833 Body mass index (BMI) 33.0-33.9, adult: Secondary | ICD-10-CM

## 2022-01-05 DIAGNOSIS — M86241 Subacute osteomyelitis, right hand: Secondary | ICD-10-CM | POA: Diagnosis present

## 2022-01-05 DIAGNOSIS — M1990 Unspecified osteoarthritis, unspecified site: Secondary | ICD-10-CM | POA: Diagnosis present

## 2022-01-05 DIAGNOSIS — E11649 Type 2 diabetes mellitus with hypoglycemia without coma: Secondary | ICD-10-CM | POA: Diagnosis present

## 2022-01-05 DIAGNOSIS — Z8049 Family history of malignant neoplasm of other genital organs: Secondary | ICD-10-CM | POA: Diagnosis not present

## 2022-01-05 DIAGNOSIS — A480 Gas gangrene: Secondary | ICD-10-CM | POA: Diagnosis present

## 2022-01-05 DIAGNOSIS — E1159 Type 2 diabetes mellitus with other circulatory complications: Secondary | ICD-10-CM | POA: Diagnosis not present

## 2022-01-05 DIAGNOSIS — Z635 Disruption of family by separation and divorce: Secondary | ICD-10-CM

## 2022-01-05 DIAGNOSIS — K219 Gastro-esophageal reflux disease without esophagitis: Secondary | ICD-10-CM | POA: Diagnosis present

## 2022-01-05 HISTORY — PX: I & D EXTREMITY: SHX5045

## 2022-01-05 HISTORY — PX: AMPUTATION: SHX166

## 2022-01-05 LAB — CBC
HCT: 40.4 % (ref 36.0–46.0)
Hemoglobin: 13.1 g/dL (ref 12.0–15.0)
MCH: 28.7 pg (ref 26.0–34.0)
MCHC: 32.4 g/dL (ref 30.0–36.0)
MCV: 88.4 fL (ref 80.0–100.0)
Platelets: 248 10*3/uL (ref 150–400)
RBC: 4.57 MIL/uL (ref 3.87–5.11)
RDW: 12.8 % (ref 11.5–15.5)
WBC: 12.8 10*3/uL — ABNORMAL HIGH (ref 4.0–10.5)
nRBC: 0 % (ref 0.0–0.2)

## 2022-01-05 LAB — COMPREHENSIVE METABOLIC PANEL
ALT: 7 U/L (ref 0–44)
AST: 12 U/L — ABNORMAL LOW (ref 15–41)
Albumin: 3.1 g/dL — ABNORMAL LOW (ref 3.5–5.0)
Alkaline Phosphatase: 84 U/L (ref 38–126)
Anion gap: 8 (ref 5–15)
BUN: 15 mg/dL (ref 8–23)
CO2: 25 mmol/L (ref 22–32)
Calcium: 8.5 mg/dL — ABNORMAL LOW (ref 8.9–10.3)
Chloride: 101 mmol/L (ref 98–111)
Creatinine, Ser: 1.1 mg/dL — ABNORMAL HIGH (ref 0.44–1.00)
GFR, Estimated: 53 mL/min — ABNORMAL LOW (ref >60)
Glucose, Bld: 400 mg/dL — ABNORMAL HIGH (ref 70–99)
Potassium: 4.3 mmol/L (ref 3.5–5.1)
Sodium: 134 mmol/L — ABNORMAL LOW (ref 135–145)
Total Bilirubin: 0.9 mg/dL (ref 0.3–1.2)
Total Protein: 6.6 g/dL (ref 6.5–8.1)

## 2022-01-05 LAB — LACTIC ACID, PLASMA: Lactic Acid, Venous: 1.7 mmol/L (ref 0.5–1.9)

## 2022-01-05 SURGERY — IRRIGATION AND DEBRIDEMENT EXTREMITY
Anesthesia: General | Site: Finger | Laterality: Right

## 2022-01-05 MED ORDER — SODIUM CHLORIDE 0.9 % IV BOLUS
1000.0000 mL | Freq: Once | INTRAVENOUS | Status: AC
  Administered 2022-01-05: 1000 mL via INTRAVENOUS

## 2022-01-05 MED ORDER — INSULIN ASPART 100 UNIT/ML IJ SOLN
0.0000 [IU] | Freq: Every day | INTRAMUSCULAR | Status: DC
  Administered 2022-01-06: 4 [IU] via SUBCUTANEOUS
  Administered 2022-01-07: 3 [IU] via SUBCUTANEOUS

## 2022-01-05 MED ORDER — HYDRALAZINE HCL 25 MG PO TABS: 25.0000 mg | ORAL_TABLET | Freq: Four times a day (QID) | ORAL | Status: DC | PRN

## 2022-01-05 MED ORDER — ONDANSETRON HCL 4 MG PO TABS: 4.0000 mg | ORAL_TABLET | Freq: Four times a day (QID) | ORAL | Status: DC | PRN

## 2022-01-05 MED ORDER — BUPIVACAINE HCL (PF) 0.25 % IJ SOLN
INTRAMUSCULAR | Status: DC | PRN
  Administered 2022-01-05: 10 mL

## 2022-01-05 MED ORDER — ONDANSETRON HCL 4 MG/2ML IJ SOLN
INTRAMUSCULAR | Status: AC
  Filled 2022-01-05: qty 2

## 2022-01-05 MED ORDER — VANCOMYCIN HCL 2000 MG/400ML IV SOLN
2000.0000 mg | Freq: Once | INTRAVENOUS | Status: AC
  Administered 2022-01-05: 2000 mg via INTRAVENOUS
  Filled 2022-01-05: qty 400

## 2022-01-05 MED ORDER — PIPERACILLIN-TAZOBACTAM 3.375 G IVPB 30 MIN
3.3750 g | Freq: Once | INTRAVENOUS | Status: AC
  Administered 2022-01-05: 3.375 g via INTRAVENOUS
  Filled 2022-01-05: qty 50

## 2022-01-05 MED ORDER — FENTANYL CITRATE (PF) 250 MCG/5ML IJ SOLN
INTRAMUSCULAR | Status: AC
  Filled 2022-01-05: qty 5

## 2022-01-05 MED ORDER — INSULIN ASPART 100 UNIT/ML IJ SOLN
12.0000 [IU] | Freq: Once | INTRAMUSCULAR | Status: AC
  Administered 2022-01-05: 12 [IU] via SUBCUTANEOUS

## 2022-01-05 MED ORDER — ONDANSETRON HCL 4 MG/2ML IJ SOLN
4.0000 mg | Freq: Four times a day (QID) | INTRAMUSCULAR | Status: DC | PRN
  Administered 2022-01-05: 19:00:00 4 mg via INTRAVENOUS
  Filled 2022-01-05: qty 2

## 2022-01-05 MED ORDER — PHENYLEPHRINE 40 MCG/ML (10ML) SYRINGE FOR IV PUSH (FOR BLOOD PRESSURE SUPPORT)
PREFILLED_SYRINGE | INTRAVENOUS | Status: DC | PRN
  Administered 2022-01-05 (×5): 80 ug via INTRAVENOUS
  Administered 2022-01-05 (×2): 40 ug via INTRAVENOUS

## 2022-01-05 MED ORDER — SUCCINYLCHOLINE CHLORIDE 200 MG/10ML IV SOSY
PREFILLED_SYRINGE | INTRAVENOUS | Status: DC | PRN
  Administered 2022-01-05: 80 mg via INTRAVENOUS

## 2022-01-05 MED ORDER — METFORMIN HCL 500 MG PO TABS
1000.0000 mg | ORAL_TABLET | Freq: Two times a day (BID) | ORAL | Status: DC
  Administered 2022-01-06: 1000 mg via ORAL
  Filled 2022-01-05: qty 2

## 2022-01-05 MED ORDER — CEFAZOLIN SODIUM-DEXTROSE 2-4 GM/100ML-% IV SOLN: 2.0000 g | INTRAVENOUS | Status: DC

## 2022-01-05 MED ORDER — LIDOCAINE 2% (20 MG/ML) 5 ML SYRINGE
INTRAMUSCULAR | Status: AC
  Filled 2022-01-05: qty 5

## 2022-01-05 MED ORDER — INSULIN GLARGINE-YFGN 100 UNIT/ML ~~LOC~~ SOLN
28.0000 [IU] | Freq: Every day | SUBCUTANEOUS | Status: DC
  Filled 2022-01-05 (×4): qty 0.28

## 2022-01-05 MED ORDER — HYDROMORPHONE HCL 1 MG/ML IJ SOLN
0.5000 mg | INTRAMUSCULAR | Status: DC | PRN
  Administered 2022-01-05: 1 mg via INTRAVENOUS
  Filled 2022-01-05: qty 1

## 2022-01-05 MED ORDER — CHLORHEXIDINE GLUCONATE 4 % EX LIQD
60.0000 mL | Freq: Once | CUTANEOUS | Status: DC
  Filled 2022-01-05: qty 60

## 2022-01-05 MED ORDER — PHENYLEPHRINE HCL (PRESSORS) 10 MG/ML IV SOLN: INTRAVENOUS | Status: DC | PRN

## 2022-01-05 MED ORDER — AMLODIPINE BESYLATE 10 MG PO TABS
10.0000 mg | ORAL_TABLET | Freq: Every day | ORAL | Status: DC
  Administered 2022-01-06 – 2022-01-08 (×3): 10 mg via ORAL
  Filled 2022-01-05 (×3): qty 1

## 2022-01-05 MED ORDER — DEXTROSE 50 % IV SOLN
INTRAVENOUS | Status: DC | PRN
  Administered 2022-01-05: 25 g via INTRAVENOUS

## 2022-01-05 MED ORDER — LIDOCAINE 2% (20 MG/ML) 5 ML SYRINGE
INTRAMUSCULAR | Status: DC | PRN
  Administered 2022-01-05: 80 mg via INTRAVENOUS

## 2022-01-05 MED ORDER — LISINOPRIL 10 MG PO TABS
10.0000 mg | ORAL_TABLET | Freq: Every day | ORAL | Status: DC
  Administered 2022-01-06 – 2022-01-08 (×3): 10 mg via ORAL
  Filled 2022-01-05 (×3): qty 1

## 2022-01-05 MED ORDER — PIPERACILLIN-TAZOBACTAM 3.375 G IVPB
3.3750 g | Freq: Three times a day (TID) | INTRAVENOUS | Status: DC
  Administered 2022-01-06 – 2022-01-07 (×4): 3.375 g via INTRAVENOUS
  Filled 2022-01-05 (×4): qty 50

## 2022-01-05 MED ORDER — ATORVASTATIN CALCIUM 10 MG PO TABS
10.0000 mg | ORAL_TABLET | Freq: Every day | ORAL | Status: DC
  Administered 2022-01-06 – 2022-01-08 (×3): 10 mg via ORAL
  Filled 2022-01-05 (×3): qty 1

## 2022-01-05 MED ORDER — SUCCINYLCHOLINE CHLORIDE 200 MG/10ML IV SOSY
PREFILLED_SYRINGE | INTRAVENOUS | Status: AC
  Filled 2022-01-05: qty 10

## 2022-01-05 MED ORDER — POVIDONE-IODINE 10 % EX SWAB: 2.0000 "application " | Freq: Once | CUTANEOUS | Status: DC

## 2022-01-05 MED ORDER — INSULIN DEGLUDEC 100 UNIT/ML ~~LOC~~ SOPN: 35.0000 [IU] | PEN_INJECTOR | Freq: Every day | SUBCUTANEOUS | Status: DC

## 2022-01-05 MED ORDER — INSULIN ASPART 100 UNIT/ML IJ SOLN
0.0000 [IU] | Freq: Three times a day (TID) | INTRAMUSCULAR | Status: DC
  Administered 2022-01-06: 5 [IU] via SUBCUTANEOUS
  Administered 2022-01-06: 8 [IU] via SUBCUTANEOUS
  Administered 2022-01-06: 3 [IU] via SUBCUTANEOUS
  Administered 2022-01-07: 5 [IU] via SUBCUTANEOUS
  Administered 2022-01-07: 3 [IU] via SUBCUTANEOUS
  Administered 2022-01-07: 5 [IU] via SUBCUTANEOUS
  Administered 2022-01-08: 2 [IU] via SUBCUTANEOUS

## 2022-01-05 MED ORDER — PROPOFOL 10 MG/ML IV BOLUS
INTRAVENOUS | Status: AC
  Filled 2022-01-05: qty 20

## 2022-01-05 MED ORDER — PROPOFOL 10 MG/ML IV BOLUS
INTRAVENOUS | Status: DC | PRN
  Administered 2022-01-05: 110 mg via INTRAVENOUS

## 2022-01-05 MED ORDER — PHENYLEPHRINE 40 MCG/ML (10ML) SYRINGE FOR IV PUSH (FOR BLOOD PRESSURE SUPPORT)
PREFILLED_SYRINGE | INTRAVENOUS | Status: AC
  Filled 2022-01-05: qty 10

## 2022-01-05 MED ORDER — FENTANYL CITRATE (PF) 250 MCG/5ML IJ SOLN
INTRAMUSCULAR | Status: DC | PRN
  Administered 2022-01-05: 25 ug via INTRAVENOUS

## 2022-01-05 MED ORDER — ACETAMINOPHEN 325 MG PO TABS
650.0000 mg | ORAL_TABLET | Freq: Four times a day (QID) | ORAL | Status: DC | PRN
  Administered 2022-01-06: 650 mg via ORAL
  Filled 2022-01-05: qty 2

## 2022-01-05 MED ORDER — ACETAMINOPHEN 650 MG RE SUPP: 650.0000 mg | Freq: Four times a day (QID) | RECTAL | Status: DC | PRN

## 2022-01-05 MED ORDER — SODIUM CHLORIDE 0.9 % IR SOLN
Status: DC | PRN
  Administered 2022-01-05: 1000 mL

## 2022-01-05 MED ORDER — SODIUM CHLORIDE 0.9 % IV SOLN: INTRAVENOUS | Status: DC

## 2022-01-05 MED ORDER — BUPIVACAINE HCL (PF) 0.25 % IJ SOLN
INTRAMUSCULAR | Status: AC
  Filled 2022-01-05: qty 30

## 2022-01-05 MED ORDER — ACETAMINOPHEN 500 MG PO TABS: 1000.0000 mg | ORAL_TABLET | Freq: Once | ORAL | Status: DC

## 2022-01-05 MED ORDER — VANCOMYCIN HCL IN DEXTROSE 1-5 GM/200ML-% IV SOLN
1000.0000 mg | INTRAVENOUS | Status: DC
  Administered 2022-01-06 – 2022-01-07 (×2): 1000 mg via INTRAVENOUS
  Filled 2022-01-05 (×3): qty 200

## 2022-01-05 MED ORDER — ONDANSETRON HCL 4 MG/2ML IJ SOLN
INTRAMUSCULAR | Status: DC | PRN
  Administered 2022-01-05: 4 mg via INTRAVENOUS

## 2022-01-05 MED ORDER — ENOXAPARIN SODIUM 40 MG/0.4ML IJ SOSY
40.0000 mg | PREFILLED_SYRINGE | INTRAMUSCULAR | Status: DC
  Administered 2022-01-06 – 2022-01-07 (×2): 40 mg via SUBCUTANEOUS
  Filled 2022-01-05 (×2): qty 0.4

## 2022-01-05 SURGICAL SUPPLY — 59 items
BAG COUNTER SPONGE SURGICOUNT (BAG) ×2 IMPLANT
BANDAGE ESMARK 6X9 LF (GAUZE/BANDAGES/DRESSINGS) IMPLANT
BNDG COHESIVE 3X5 TAN ST LF (GAUZE/BANDAGES/DRESSINGS) ×1 IMPLANT
BNDG COHESIVE 4X5 TAN STRL (GAUZE/BANDAGES/DRESSINGS) ×2 IMPLANT
BNDG CONFORM 2 STRL LF (GAUZE/BANDAGES/DRESSINGS) ×1 IMPLANT
BNDG CONFORM 3 STRL LF (GAUZE/BANDAGES/DRESSINGS) ×1 IMPLANT
BNDG ELASTIC 4X5.8 VLCR STR LF (GAUZE/BANDAGES/DRESSINGS) ×2 IMPLANT
BNDG ELASTIC 6X5.8 VLCR STR LF (GAUZE/BANDAGES/DRESSINGS) ×2 IMPLANT
BNDG ESMARK 6X9 LF (GAUZE/BANDAGES/DRESSINGS)
BNDG GAUZE ELAST 4 BULKY (GAUZE/BANDAGES/DRESSINGS) ×2 IMPLANT
BOOTCOVER CLEANROOM LRG (PROTECTIVE WEAR) ×4 IMPLANT
COVER SURGICAL LIGHT HANDLE (MISCELLANEOUS) ×2 IMPLANT
CUFF TOURN SGL QUICK 34 (TOURNIQUET CUFF)
CUFF TOURN SGL QUICK 42 (TOURNIQUET CUFF) IMPLANT
CUFF TRNQT CYL 34X4.125X (TOURNIQUET CUFF) IMPLANT
DECANTER SPIKE VIAL GLASS SM (MISCELLANEOUS) IMPLANT
DRAPE U-SHAPE 47X51 STRL (DRAPES) IMPLANT
DRSG PAD ABDOMINAL 8X10 ST (GAUZE/BANDAGES/DRESSINGS) ×2 IMPLANT
DURAPREP 26ML APPLICATOR (WOUND CARE) ×2 IMPLANT
ELECT REM PT RETURN 9FT ADLT (ELECTROSURGICAL) ×2
ELECTRODE REM PT RTRN 9FT ADLT (ELECTROSURGICAL) ×1 IMPLANT
EVACUATOR 1/8 PVC DRAIN (DRAIN) IMPLANT
GAUZE SPONGE 4X4 12PLY STRL (GAUZE/BANDAGES/DRESSINGS) ×2 IMPLANT
GAUZE SPONGE 4X4 12PLY STRL LF (GAUZE/BANDAGES/DRESSINGS) ×1 IMPLANT
GAUZE XEROFORM 1X8 LF (GAUZE/BANDAGES/DRESSINGS) ×4 IMPLANT
GLOVE SRG 8 PF TXTR STRL LF DI (GLOVE) ×1 IMPLANT
GLOVE SURG ENC MOIS LTX SZ7 (GLOVE) ×2 IMPLANT
GLOVE SURG ORTHO LTX SZ7.5 (GLOVE) ×2 IMPLANT
GLOVE SURG UNDER POLY LF SZ7 (GLOVE) ×2 IMPLANT
GLOVE SURG UNDER POLY LF SZ8 (GLOVE) ×1
GOWN STRL REUS W/ TWL LRG LVL3 (GOWN DISPOSABLE) ×2 IMPLANT
GOWN STRL REUS W/TWL 2XL LVL3 (GOWN DISPOSABLE) ×2 IMPLANT
GOWN STRL REUS W/TWL LRG LVL3 (GOWN DISPOSABLE) ×2
HANDPIECE INTERPULSE COAX TIP (DISPOSABLE)
KIT BASIN OR (CUSTOM PROCEDURE TRAY) ×2 IMPLANT
KIT TURNOVER KIT B (KITS) ×2 IMPLANT
MANIFOLD NEPTUNE II (INSTRUMENTS) ×2 IMPLANT
NS IRRIG 1000ML POUR BTL (IV SOLUTION) ×2 IMPLANT
PACK ORTHO EXTREMITY (CUSTOM PROCEDURE TRAY) ×2 IMPLANT
PAD ABD 8X10 STRL (GAUZE/BANDAGES/DRESSINGS) ×1 IMPLANT
PAD ARMBOARD 7.5X6 YLW CONV (MISCELLANEOUS) ×4 IMPLANT
PAD CAST 4YDX4 CTTN HI CHSV (CAST SUPPLIES) ×2 IMPLANT
PADDING CAST COTTON 4X4 STRL (CAST SUPPLIES) ×2
SET HNDPC FAN SPRY TIP SCT (DISPOSABLE) IMPLANT
SPONGE T-LAP 18X18 ~~LOC~~+RFID (SPONGE) ×2 IMPLANT
SPONGE T-LAP 4X18 ~~LOC~~+RFID (SPONGE) ×4 IMPLANT
STOCKINETTE IMPERVIOUS 9X36 MD (GAUZE/BANDAGES/DRESSINGS) ×2 IMPLANT
SUCTION FRAZIER HANDLE 10FR (MISCELLANEOUS) ×1
SUCTION TUBE FRAZIER 10FR DISP (MISCELLANEOUS) ×1 IMPLANT
SUT ETHILON 3 0 PS 1 (SUTURE) IMPLANT
SUT ETHILON 4 0 PS 2 18 (SUTURE) IMPLANT
SWAB CULTURE ESWAB REG 1ML (MISCELLANEOUS) IMPLANT
SYR CONTROL 10ML LL (SYRINGE) IMPLANT
TOWEL GREEN STERILE (TOWEL DISPOSABLE) ×2 IMPLANT
TOWEL GREEN STERILE FF (TOWEL DISPOSABLE) ×2 IMPLANT
TUBE CONNECTING 12X1/4 (SUCTIONS) ×2 IMPLANT
UNDERPAD 30X36 HEAVY ABSORB (UNDERPADS AND DIAPERS) ×2 IMPLANT
WATER STERILE IRR 1000ML POUR (IV SOLUTION) ×2 IMPLANT
YANKAUER SUCT BULB TIP NO VENT (SUCTIONS) ×2 IMPLANT

## 2022-01-05 NOTE — Anesthesia Preprocedure Evaluation (Addendum)
Anesthesia Evaluation  Patient identified by MRN, date of birth, ID band Patient awake    Reviewed: Allergy & Precautions, NPO status , Patient's Chart, lab work & pertinent test results  Airway Mallampati: II  TM Distance: >3 FB Neck ROM: Full    Dental  (+) Edentulous Upper, Dental Advisory Given, Poor Dentition, Missing,    Pulmonary former smoker,    Pulmonary exam normal breath sounds clear to auscultation       Cardiovascular hypertension, Pt. on medications (-) angina(-) Past MI and (-) CHF  Rhythm:Irregular Rate:Normal     Neuro/Psych negative neurological ROS  negative psych ROS   GI/Hepatic Neg liver ROS, GERD  Medicated and Controlled,  Endo/Other  diabetes, Poorly Controlled, Type 2, Insulin Dependent, Oral Hypoglycemic Agents  Renal/GU negative Renal ROS     Musculoskeletal  (+) Arthritis , Osteoarthritis,    Abdominal   Peds  Hematology  (+) Blood dyscrasia, anemia ,   Anesthesia Other Findings Day of surgery medications reviewed with the patient.  Reproductive/Obstetrics                            Anesthesia Physical  Anesthesia Plan  ASA: 3 and emergent  Anesthesia Plan: General   Post-op Pain Management: Minimal or no pain anticipated   Induction: Intravenous, Rapid sequence and Cricoid pressure planned  PONV Risk Score and Plan: 2 and Treatment may vary due to age or medical condition, Ondansetron and Diphenhydramine  Airway Management Planned: Oral ETT  Additional Equipment: None  Intra-op Plan:   Post-operative Plan: Extubation in OR  Informed Consent: I have reviewed the patients History and Physical, chart, labs and discussed the procedure including the risks, benefits and alternatives for the proposed anesthesia with the patient or authorized representative who has indicated his/her understanding and acceptance.     Dental advisory given  Plan  Discussed with: CRNA  Anesthesia Plan Comments: ( )       Anesthesia Quick Evaluation

## 2022-01-05 NOTE — Op Note (Signed)
01/05/2022  11:58 PM  PATIENT:  Diana Coleman    PRE-OPERATIVE DIAGNOSIS: Right index finger osteomyelitis distal phalanx  POST-OPERATIVE DIAGNOSIS:  Same  PROCEDURE: Right index finger amputation through the middle phalanx  SURGEON:  Johnny Bridge, MD  PHYSICIAN ASSISTANT: Merlene Pulling, PA-C, present and scrubbed throughout the case, critical for completion in a timely fashion, and for retraction, instrumentation, and closure.  ANESTHESIA:   General with digital block  PREOPERATIVE INDICATIONS:  [name] is a  71 y.o. female who has had both legs amputated, and is also had a right long finger amputation at the middle phalanx for gangrene who presented with increasing swelling and pain in the index finger with x-rays that demonstrated gas consistent with gangrene.  The risks benefits and alternatives were discussed with the patient preoperatively including but not limited to the risks of infection, bleeding, nerve injury, cardiopulmonary complications, the need for revision surgery, among others, and the patient was willing to proceed.  ESTIMATED BLOOD LOSS: None  OPERATIVE IMPLANTS: None  OPERATIVE FINDINGS: Necrotic tip of the index finger.  OPERATIVE PROCEDURE: The patient was brought to the operating room and placed in supine position.  General anesthesia was administered.  The right upper extremity was prepped and draped in usual sterile fashion.  Timeout performed.  The arm was elevated and exsanguinated and tourniquet was inflated.  [place] incision was made at the tip of the index finger, and dissection carried down.  I left as much of the soft tissue as I felt was viable, there was a fair amount of necrotic soft tissue that was already dead.  The tip of the middle phalanx was exposed completely, and I used a bone cutter to transect the middle phalanx at the appropriate level allowing for soft tissue coverage.  I irrigated the wounds copiously, and then repaired the  skin with nylon sutures.  Sterile dressing was applied.  Digital block also applied.  She was awakened and returned to the PACU in stable and satisfactory condition.  There were no complications and she tolerated the procedure well.  She can likely be discharged home tomorrow as long as her medical comorbidities are under some level of control, her blood sugar was 400 when she came in, and then 45 when she was in the preoperative holding area.  Antibiotics are optional, as I do believe that we performed an amputation through a clean bed.  Johnny Bridge, MD

## 2022-01-05 NOTE — ED Provider Triage Note (Addendum)
Emergency Medicine Provider Triage Evaluation Note  Diana Coleman , a 71 y.o. female  was evaluated in triage. Pt complains of pain and swelling in the right second finger over the last month. No fever. Reports associated chills. Patient is a diabetic.  Review of Systems  Positive:  Negative: See above   Physical Exam  Ht 5\' 7"  (1.702 m)    Wt 96.9 kg    BMI 33.46 kg/m  Gen:   Awake, no distress   Resp:  Normal effort  MSK:   Moves extremities without difficulty  Other:    Medical Decision Making  Medically screening exam initiated at 3:33 PM.  Appropriate orders placed.  Diana Coleman was informed that the remainder of the evaluation will be completed by another provider, this initial triage assessment does not replace that evaluation, and the importance of remaining in the ED until their evaluation is complete.     Hendricks Limes, PA-C 01/05/22 1535    [address] [place], [place] 01/05/22 1536

## 2022-01-05 NOTE — H&P (Signed)
History and Physical    Diana Coleman ION:[id number] DOB: Mar 08, 1951 DOA: 01/05/2022  PCP: Pcp, No (Confirm with patient/family/NH records and if not entered, this has to be entered at [facility] point of entry) Patient coming from: Home  I have personally briefly reviewed patient's old medical records in [place]  Chief Complaint: Right index finger swelling and pain  HPI: Diana Coleman is a 71 y.o. female with medical history significant of IIDM, HTN, HLD, osteomyelitis of bilateral foot status post bilateral BKA and osteomyelitis of right mid finger osteomyelitis status post partial mentation in 2017, came with worsening of right index finger swelling and pain.  Patient remembered the right index finger wound started from a small opening might happen 2 to 3 weeks ago, she does not feel much of the pain for the neuropathy and 2 to 3 days ago she noticed the right index finger become swollen hard to move.  She denies any fever or chills.  She denies any history of Raynaud's syndrome.  ED Course: Febrile, no tachycardia, blood pressure stable.  X-ray shows right index finger osteomyelitis and gas gangrene  WBC 12.8.  Patient was started on vancomycin and Zosyn in the ED, hand surgeon Dr. Mardelle Matte was consulted.  Review of Systems: As per HPI otherwise 14 point review of systems negative.    Past Medical History:  Diagnosis Date   Cellulitis of foot, left 10/24/2014   hx/notes 10/24/2014   Charcot foot due to diabetes mellitus ([place])    Hepatitis    Hep B    Hypertension    Osteomyelitis of right foot ([place])    hx/notes 10/24/2014   Proliferative diabetic retinopathy ([place])    right eye and vitreous hemorrhage   Type II diabetes mellitus ([place])    Since 1998    Past Surgical History:  Procedure Laterality Date   ABOVE KNEE LEG AMPUTATION Right 2004   AMPUTATION Left 10/29/2014   Procedure: AMPUTATION BELOW KNEE - LEFT;  Surgeon: Newt Minion, MD;  Location: [facility];  Service:  Orthopedics;  Laterality: Left;   CATARACT EXTRACTION W/ INTRAOCULAR LENS  IMPLANT, BILATERAL     FOOT SURGERY Left 1980's   "ulcer removed"   I & D EXTREMITY Right 10/04/2016   Procedure: IRRIGATION AND DEBRIDEMENT MIDDLE FINGER AND AMPUTATION;  Surgeon: Iran Planas, MD;  Location: [facility];  Service: Orthopedics;  Laterality: Right;   MEMBRANE PEEL Right 02/01/2019   Procedure: [place] Getting;  Surgeon: Hayden Pedro, MD;  Location: [facility];  Service: Ophthalmology;  Laterality: Right;   PARS PLANA VITRECTOMY 27 GAUGE Right 02/01/2019   PARS PLANA VITRECTOMY 27 GAUGE Right 02/01/2019   Procedure: PARS PLANA VITRECTOMY 27 GAUGE, MEMBRANE PEEL, ENDOLASER, GAS INJECTION;  Surgeon: Hayden Pedro, MD;  Location: [facility];  Service: Ophthalmology;  Laterality: Right;   REDUCTION MAMMAPLASTY Bilateral 1987   TOE AMPUTATION Left ~ 2011   "top of my 3rd toe"   [place]     reports that she has quit smoking. Her smoking use included cigarettes. She has a 15.00 pack-year smoking history. She has never used smokeless tobacco. She reports that she does not drink alcohol and does not use drugs.  Allergies  Allergen Reactions   Clindamycin/Lincomycin Itching   Codeine Itching    Family History  Problem Relation Age of Onset   Uterine cancer Mother    Gout Father    Thyroid disease Sister      Prior to  Admission medications   Medication Sig Start Date End Date Taking? Authorizing Provider  amLODipine (NORVASC) 10 MG tablet Take 10 mg by mouth daily.    [provider]  atorvastatin (LIPITOR) 10 MG tablet Take 10 mg by mouth daily.  10/21/18   [provider]  Dulaglutide 1.5 MG/0.5ML SOPN Inject 4.5 mg into the skin every Thursday.    [provider]  insulin degludec (TRESIBA) 100 UNIT/ML FlexTouch Pen Inject 35 Units into the skin daily.    [provider]  lisinopril (PRINIVIL,ZESTRIL) 10 MG tablet Take 1 tablet (10 mg total) by mouth daily.  11/02/14   Kelvin Cellar, MD  metFORMIN (GLUCOPHAGE) 1000 MG tablet Take 1,000 mg by mouth 2 (two) times daily with a meal.    [provider]    Physical Exam: Vitals:   01/05/22 1515 01/05/22 1558 01/05/22 1720  BP:  (!) 148/76 (!) 153/82  Pulse:  76 (!) 56  Resp:  18 20  Temp:  98.2 F (36.8 C)   SpO2:  97% 93%  Weight: 96.9 kg    Height: 5\' 7"  (1.702 m)      Constitutional: NAD, calm, comfortable Vitals:   01/05/22 1515 01/05/22 1558 01/05/22 1720  BP:  (!) 148/76 (!) 153/82  Pulse:  76 (!) 56  Resp:  18 20  Temp:  98.2 F (36.8 C)   SpO2:  97% 93%  Weight: 96.9 kg    Height: 5\' 7"  (1.702 m)     Eyes: PERRL, lids and conjunctivae normal ENMT: Mucous membranes are moist. Posterior pharynx clear of any exudate or lesions.Normal dentition.  Neck: normal, supple, no masses, no thyromegaly Respiratory: clear to auscultation bilaterally, no wheezing, no crackles. Normal respiratory effort. No accessory muscle use.  Cardiovascular: Regular rate and rhythm, no murmurs / rubs / gallops. No extremity edema. 2+ pedal pulses. No carotid bruits.  Abdomen: no tenderness, no masses palpated. No hepatosplenomegaly. Bowel sounds positive.  Musculoskeletal: Right index finger swollen and tenderness and discoloration, significant reduced ROM PIP and DIP Skin: no rashes, lesions, ulcers. No induration Neurologic: CN 2-12 grossly intact. Sensation intact, DTR normal. Strength 5/5 in all 4.  Psychiatric: Normal judgment and insight. Alert and oriented x 3. Normal mood.     Labs on Admission: I have personally reviewed following labs and imaging studies  CBC: Recent Labs  Lab 01/05/22 1530  WBC 12.8*  HGB 13.1  HCT 40.4  MCV 88.4  PLT 093   Basic Metabolic Panel: Recent Labs  Lab 01/05/22 1530  NA 134*  K 4.3  CL 101  CO2 25  GLUCOSE 400*  BUN 15  CREATININE 1.10*  CALCIUM 8.5*   GFR: Estimated Creatinine Clearance: 55.2 mL/min (A) (by C-G formula based on  SCr of 1.1 mg/dL (H)). Liver Function Tests: Recent Labs  Lab 01/05/22 1530  AST 12*  ALT 7  ALKPHOS 84  BILITOT 0.9  PROT 6.6  ALBUMIN 3.1*   No results for input(s): LIPASE, AMYLASE in the last 168 hours. No results for input(s): AMMONIA in the last 168 hours. Coagulation Profile: No results for input(s): INR, PROTIME in the last 168 hours. Cardiac Enzymes: No results for input(s): CKTOTAL, CKMB, CKMBINDEX, TROPONINI in the last 168 hours. BNP (last 3 results) No results for input(s): PROBNP in the last 8760 hours. HbA1C: No results for input(s): HGBA1C in the last 72 hours. CBG: No results for input(s): GLUCAP in the last 168 hours. Lipid Profile: No results for input(s): CHOL,  HDL, LDLCALC, TRIG, CHOLHDL, LDLDIRECT in the last 72 hours. Thyroid Function Tests: No results for input(s): TSH, T4TOTAL, FREET4, T3FREE, THYROIDAB in the last 72 hours. Anemia Panel: No results for input(s): VITAMINB12, FOLATE, FERRITIN, TIBC, IRON, RETICCTPCT in the last 72 hours. Urine analysis:    Component Value Date/Time   COLORURINE YELLOW 07/31/2015 [place] 07/31/2015 0854   LABSPEC 1.022 07/31/2015 0854   PHURINE 5.0 07/31/2015 0854   GLUCOSEU NEGATIVE 07/31/2015 0854   HGBUR SMALL (A) 07/31/2015 0854   BILIRUBINUR NEGATIVE 07/31/2015 0854   KETONESUR NEGATIVE 07/31/2015 0854   PROTEINUR NEGATIVE 07/31/2015 0854   UROBILINOGEN 1.0 07/31/2015 0854   NITRITE NEGATIVE 07/31/2015 0854   LEUKOCYTESUR SMALL (A) 07/31/2015 0854    Radiological Exams on Admission: DG Finger Index Right  Addendum Date: 01/05/2022   ADDENDUM REPORT: 01/05/2022 16:03 ADDENDUM: These results were called by telephone at the time of interpretation on 01/05/2022 at at 3:58 p.m. to provider Dr. Raul Del, who verbally acknowledged these results. Electronically Signed   By: Fidela Salisbury M.D.   On: 01/05/2022 16:03   Result Date: 01/05/2022 CLINICAL DATA:  Post amputation of the right  middle finger because of abscess. EXAM: RIGHT INDEX FINGER 2+V COMPARISON:  October 03, 2016 FINDINGS: Post amputation of the distal third phalanx chest distal to the D IP joint. March soft tissue swelling and marked subcutaneous emphysema centered at the amputation site and extending along the third digit. IMPRESSION: 1. Post amputation of the distal third phalanx distal to the D IP joint. 2. Marked soft tissue swelling and marked subcutaneous emphysema centered at the amputation site and extending along the third digit. Findings are concerning for gaseous gangrene of the finger. Electronically Signed: By: Fidela Salisbury M.D. On: 01/05/2022 15:55    EKG: Ordered  Assessment/Plan Principal Problem:   Osteomyelitis ([place])  (please populate well all problems here in Problem List. (For example, if patient is on BP meds at home and you resume or decide to hold them, it is a problem that needs to be her. Same for CAD, COPD, HLD and so on)  Right index finger subacute osteomyelitis and gas gangrene and pathological fracture on the tip? -Likely will need at least partial amputation, n.p.o. for now, hand surgeon will see the patient emergently. -Continue vancomycin and Zosyn -Pain control.  IDDM with hyperglycemia -Probably related to the infection -Resume Lantus 35 units, and add sliding scale.  HTN uncontrolled. -Continue amlodipine and lisinopril -Add as needed hydralazine -Continue metformin   DVT prophylaxis: Lovenox Code Status: Full code Family Communication: Granddaughter at bedside Disposition Plan: Expect hand surgery intervention, expect more than 2 midnight hospital stay. Consults called: Hand surgery Admission status: MedSurg admission   Lequita Halt MD [facility] Pager [phone]  01/05/2022, 6:12 PM

## 2022-01-05 NOTE — Progress Notes (Signed)
Pharmacy Antibiotic Note  Diana Coleman is a 71 y.o. female admitted on 01/05/2022 with  wound infection .  Pharmacy has been consulted for vancomycin and zosyn dosing.  Patient with a history of IIDM, HTN, HLD, osteomyelitis of bilateral foot s/p bilateral BKA, osteomyelitis of rt mid finger. Patient presenting with worsening of right index finger swelling and pain.  SCr 1.1 - might be slightly elevated but no recent labs WBC 12.8; LA 1.7; T 98.2 F  Plan: Zosyn 3.375g IV q8h (4 hour infusion) Vancomycin 2000 mg once then 1000 mg q24hr (eAUC 494.8) unless change in renal function Trend WBC, Fever, Renal function, & Clinical course F/u cultures, clinical course, WBC, fever De-escalate when able Levels at steady state  Height: 5\' 7"  (170.2 cm) Weight: 96.9 kg (213 lb 10 oz) IBW/kg (Calculated) : 61.6  Temp (24hrs), Avg:98.2 F (36.8 C), Min:98.2 F (36.8 C), Max:98.2 F (36.8 C)  Recent Labs  Lab 01/05/22 1530  WBC 12.8*  CREATININE 1.10*    Estimated Creatinine Clearance: 55.2 mL/min (A) (by C-G formula based on SCr of 1.1 mg/dL (H)).    Allergies  Allergen Reactions   Clindamycin/Lincomycin Itching   Codeine Itching    Antimicrobials this admission: vancomycin 1/15 >>  Zosyn 1/15 >>  Microbiology results: Pending  Thank you for allowing pharmacy to be a part of this patients care.  Lorelei Pont, PharmD, BCPS 01/05/2022 5:42 PM ED Clinical Pharmacist -  [phone]

## 2022-01-05 NOTE — ED Provider Notes (Signed)
[facility] EMERGENCY DEPARTMENT Provider Note   CSN: [id number] Arrival date & time: 01/05/22  1451     History  Chief Complaint  Patient presents with   Abscess    Diana Coleman is a 71 y.o. female.  Patient presents chief complaint of swelling pain tenderness to the right hand index fingertip.  Symptoms ongoing for a month but acutely worse in the last 3 to 4 days.  No fevers at home reported.  No vomiting or diarrhea reported.  Patient states she had similar episodes occurred to the right hand middle finger about a month ago that required amputation.  She does not recall the physician who performed the surgery.      Home Medications Prior to Admission medications   Medication Sig Start Date End Date Taking? Authorizing Provider  amLODipine (NORVASC) 10 MG tablet Take 10 mg by mouth daily.    [provider]  atorvastatin (LIPITOR) 10 MG tablet Take 10 mg by mouth daily.  10/21/18   [provider]  Dulaglutide 1.5 MG/0.5ML SOPN Inject 4.5 mg into the skin every Thursday.    [provider]  insulin degludec (TRESIBA) 100 UNIT/ML FlexTouch Pen Inject 35 Units into the skin daily.    [provider]  lisinopril (PRINIVIL,ZESTRIL) 10 MG tablet Take 1 tablet (10 mg total) by mouth daily. 11/02/14   Kelvin Cellar, MD  metFORMIN (GLUCOPHAGE) 1000 MG tablet Take 1,000 mg by mouth 2 (two) times daily with a meal.    [provider]      Allergies    Clindamycin/lincomycin and Codeine    Review of Systems   Review of Systems  Constitutional:  Negative for fever.  HENT:  Negative for ear pain.   Eyes:  Negative for pain.  Respiratory:  Negative for cough.   Cardiovascular:  Negative for chest pain.  Gastrointestinal:  Negative for abdominal pain.  Genitourinary:  Negative for flank pain.  Musculoskeletal:  Negative for back pain.  Skin:  Negative for rash.  Neurological:  Negative for headaches.   Physical  Exam Updated Vital Signs BP (!) 153/82    Pulse (!) 56    Temp 98.2 F (36.8 C)    Resp 20    Ht 5\' 7"  (1.702 m)    Wt 96.9 kg    SpO2 93%    BMI 33.46 kg/m  Physical Exam Constitutional:      General: She is not in acute distress.    Appearance: Normal appearance.  HENT:     Head: Normocephalic.     Nose: Nose normal.  Eyes:     Extraocular Movements: Extraocular movements intact.  Cardiovascular:     Rate and Rhythm: Normal rate.  Pulmonary:     Effort: Pulmonary effort is normal.  Musculoskeletal:     Cervical back: Normal range of motion.     Comments: Right hand index finger distal tip appears swollen, indurated, tender to palpation.  No purulent discharge noted.  Right hand middle finger appears well-healed with prior distal tip amputation.  Neurological:     General: No focal deficit present.     Mental Status: She is alert. Mental status is at baseline.    ED Results / Procedures / Treatments   Labs (all labs ordered are listed, but only abnormal results are displayed) Labs Reviewed  COMPREHENSIVE METABOLIC PANEL - Abnormal; Notable for the following components:      Result Value   Sodium 134 (*)  Glucose, Bld 400 (*)    Creatinine, Ser 1.10 (*)    Calcium 8.5 (*)    Albumin 3.1 (*)    AST 12 (*)    GFR, Estimated 53 (*)    All other components within normal limits  CBC - Abnormal; Notable for the following components:   WBC 12.8 (*)    All other components within normal limits  CULTURE, BLOOD (ROUTINE X 2)  CULTURE, BLOOD (ROUTINE X 2)  LACTIC ACID, PLASMA  LACTIC ACID, PLASMA    EKG None  Radiology DG Finger Index Right  Addendum Date: 01/05/2022   ADDENDUM REPORT: 01/05/2022 16:03 ADDENDUM: These results were called by telephone at the time of interpretation on 01/05/2022 at at 3:58 p.m. to provider Dr. Raul Del, who verbally acknowledged these results. Electronically Signed   By: Fidela Salisbury M.D.   On: 01/05/2022 16:03   Result Date:  01/05/2022 CLINICAL DATA:  Post amputation of the right middle finger because of abscess. EXAM: RIGHT INDEX FINGER 2+V COMPARISON:  October 03, 2016 FINDINGS: Post amputation of the distal third phalanx chest distal to the D IP joint. March soft tissue swelling and marked subcutaneous emphysema centered at the amputation site and extending along the third digit. IMPRESSION: 1. Post amputation of the distal third phalanx distal to the D IP joint. 2. Marked soft tissue swelling and marked subcutaneous emphysema centered at the amputation site and extending along the third digit. Findings are concerning for gaseous gangrene of the finger. Electronically Signed: By: Fidela Salisbury M.D. On: 01/05/2022 15:55    Procedures Procedures    Medications Ordered in ED Medications  piperacillin-tazobactam (ZOSYN) IVPB 3.375 g (3.375 g Intravenous New Bag/Given 01/05/22 1749)  vancomycin (VANCOREADY) IVPB 2000 mg/400 mL (has no administration in time range)  insulin aspart (novoLOG) injection 12 Units (12 Units Subcutaneous Given 01/05/22 1754)  sodium chloride 0.9 % bolus 1,000 mL (1,000 mLs Intravenous New Bag/Given 01/05/22 1749)    ED Course/ Medical Decision Making/ A&P                           Medical Decision Making Amount and/or Complexity of Data Reviewed Labs: ordered. Decision-making details documented in ED Course. Radiology: ordered. Decision-making details documented in ED Course. Discussion of management or test interpretation with external provider(s): Hand surgery consultation.  Risk Decision regarding hospitalization.   Labs show mild white count of 12.8.  Chemistry shows glucose of 400, otherwise labs unremarkable anion gap is normal bicarb is normal at 25.  Blood cultures and lactic acid sent and pending.  X-ray of the right hand index finger show marked soft tissue swelling and subcu emphysema concerning for gas gangrene of the finger.  Consultation with hand surgery  requested. D/w Dr. Mardelle Matte.  Requesting medical admission.  Case discussed with medicine who will see the patient.  Patient started on broad-spectrum antibiotics.        Final Clinical Impression(s) / ED Diagnoses Final diagnoses:  Osteomyelitis of right hand, unspecified type ([place])  Hyperglycemia    Rx / DC Orders ED Discharge Orders     None         Luna Fuse, MD 01/05/22 [phone]

## 2022-01-05 NOTE — Anesthesia Procedure Notes (Signed)
Procedure Name: Intubation Date/Time: 01/05/2022 11:19 PM Performed by: Trinna Post., CRNA Pre-anesthesia Checklist: Patient identified, Emergency Drugs available, Suction available, Patient being monitored and Timeout performed Patient Re-evaluated:Patient Re-evaluated prior to induction Oxygen Delivery Method: Circle system utilized Preoxygenation: Pre-oxygenation with 100% oxygen Induction Type: IV induction, Rapid sequence and Cricoid Pressure applied Laryngoscope Size: Mac and 3 Grade View: Grade I Tube type: Oral Tube size: 7.0 mm Number of attempts: 1 Airway Equipment and Method: Stylet Placement Confirmation: ETT inserted through vocal cords under direct vision, positive ETCO2 and breath sounds checked- equal and bilateral Secured at: 22 cm Tube secured with: Tape Dental Injury: Teeth and Oropharynx as per pre-operative assessment

## 2022-01-05 NOTE — Consult Note (Signed)
ORTHOPAEDIC CONSULTATION  REQUESTING PHYSICIAN: Lequita Halt, MD  Chief Complaint: right hand index fingertip swelling   HPI: Diana Coleman is a 71 y.o. female with history of Type II Diabetes, HTN, HLD, osteomyelitis of bilateral foot s/p bilateral BKA presented to the [facility] ED with worsening of right index finger swelling and pain. She has a history of a right index finger partial amputation per x-rays. She feels like wound has increased in size over the past 2-3 weeks and over the last 2-3 days her finger has become more swollen and difficult to move.  She has had a previous right long finger amputation of the distal phalanx for the same condition.     Past Medical History:  Diagnosis Date   Cellulitis of foot, left 10/24/2014   hx/notes 10/24/2014   Charcot foot due to diabetes mellitus ([place])    Hepatitis    Hep B    Hypertension    Osteomyelitis of right foot ([place])    hx/notes 10/24/2014   Proliferative diabetic retinopathy ([place])    right eye and vitreous hemorrhage   Type II diabetes mellitus ([place])    Since 1998   Past Surgical History:  Procedure Laterality Date   ABOVE KNEE LEG AMPUTATION Right 2004   AMPUTATION Left 10/29/2014   Procedure: AMPUTATION BELOW KNEE - LEFT;  Surgeon: Newt Minion, MD;  Location: [place];  Service: Orthopedics;  Laterality: Left;   CATARACT EXTRACTION W/ INTRAOCULAR LENS  IMPLANT, BILATERAL     FOOT SURGERY Left 1980's   "ulcer removed"   I & D EXTREMITY Right 10/04/2016   Procedure: IRRIGATION AND DEBRIDEMENT MIDDLE FINGER AND AMPUTATION;  Surgeon: Iran Planas, MD;  Location: [place];  Service: Orthopedics;  Laterality: Right;   MEMBRANE PEEL Right 02/01/2019   Procedure: Carvel Getting;  Surgeon: Hayden Pedro, MD;  Location: [place];  Service: Ophthalmology;  Laterality: Right;   PARS PLANA VITRECTOMY 27 GAUGE Right 02/01/2019   PARS PLANA VITRECTOMY 27 GAUGE Right 02/01/2019   Procedure: PARS PLANA VITRECTOMY 27 GAUGE, MEMBRANE PEEL,  ENDOLASER, GAS INJECTION;  Surgeon: Hayden Pedro, MD;  Location: [place];  Service: Ophthalmology;  Laterality: Right;   REDUCTION MAMMAPLASTY Bilateral 1987   TOE AMPUTATION Left ~ 2011   "top of my 3rd toe"   TUBAL LIGATION  1973   Social History   Socioeconomic History   Marital status: Legally Separated    Spouse name: Not on file   Number of children: Not on file   Years of education: Not on file   Highest education level: Not on file  Occupational History   Not on file  Tobacco Use   Smoking status: Former    Packs/day: 0.50    Years: 30.00    Pack years: 15.00    Types: Cigarettes   Smokeless tobacco: Never   Tobacco comments:    "quit smoking ~ 2000"  Vaping Use   Vaping Use: Never used  Substance and Sexual Activity   Alcohol use: No    Alcohol/week: 0.0 standard drinks   Drug use: No   Sexual activity: Never  Other Topics Concern   Not on file  Social History Narrative   Divorced, 5 children.  Keeps a great grand 2 days out of the week.  He is [age].     Social Determinants of Health   Financial Resource Strain: Not on file  Food Insecurity: Not on file  Transportation Needs: Not on file  Physical Activity: Not on file  Stress: Not on file  Social Connections: Not on file   Family History  Problem Relation Age of Onset   Uterine cancer Mother    Gout Father    Thyroid disease Sister    Allergies  Allergen Reactions   Clindamycin/Lincomycin Itching   Codeine Itching     Positive ROS: All other systems have been reviewed and were otherwise negative with the exception of those mentioned in the HPI and as above.  Physical Exam: General: Alert, no acute distress Cardiovascular: No pedal edema, she has intact radial pulses and brachial pulses on the right side. Respiratory: No cyanosis, no use of accessory musculature GI: No organomegaly, abdomen is soft and non-tender Skin: She has an abnormal nail on the index finger, with diffuse swelling,  slight erythema. Neurologic: Sensation intact distally although I suspect she has an element of neuropathy Psychiatric: Patient is competent for consent with normal mood and affect Lymphatic: No axillary or cervical lymphadenopathy  MUSCULOSKELETAL: Right index finger has substantial soft tissue swelling with pain, although there is no real drainage, but it does feel slightly fluctuant.  Imaging: X-rays of the right index finger demonstrate advanced bony destruction within the distal phalanx, with subcutaneous emphysema consistent with gangrene.   Assessment: Right index finger osteomyelitis with gas gangrene, and a destructive process which has eaten almost the entirety of the distal phalanx  Plan: Plan for amputation of the distal phalanx through the distal interphalangeal joint of the right index finger.  The risks benefits and alternatives were discussed with the patient including but not limited to the risks of nonoperative treatment, versus surgical intervention including infection, bleeding, nerve injury,  the need for revision surgery, blood clots, cardiopulmonary complications, morbidity, mortality, among others, and they were willing to proceed.       Ventura Bruns, PA-C    01/05/2022 8:04 PM    Patient seen and examined and agree with above.  Marchia Bond, MD 11:14 PM

## 2022-01-05 NOTE — Progress Notes (Signed)
Patient with abscess, right index finger, it looks like she is already had the distal phalanx at least partially amputated.  There is residual gas gangrene at the tip, she is likely going to need revision amputation.  The emergency room physicians were working on optimizing her blood sugars, which was 400 as of a couple of hours ago.  Keep n.p.o., full consult to follow, medical admission, orthopedic consultation, probable surgical debridement with revision amputation later this evening.  Marchia Bond, MD

## 2022-01-05 NOTE — ED Triage Notes (Signed)
The pt has an abscess in her  rt index finger  thatstarted one month ago she is a diabetic  and she had the same problem  in her  rt middle finger that required surgery

## 2022-01-06 DIAGNOSIS — E1169 Type 2 diabetes mellitus with other specified complication: Secondary | ICD-10-CM

## 2022-01-06 DIAGNOSIS — Z89512 Acquired absence of left leg below knee: Secondary | ICD-10-CM

## 2022-01-06 DIAGNOSIS — I152 Hypertension secondary to endocrine disorders: Secondary | ICD-10-CM

## 2022-01-06 DIAGNOSIS — M869 Osteomyelitis, unspecified: Secondary | ICD-10-CM

## 2022-01-06 DIAGNOSIS — E1159 Type 2 diabetes mellitus with other circulatory complications: Secondary | ICD-10-CM

## 2022-01-06 DIAGNOSIS — E785 Hyperlipidemia, unspecified: Secondary | ICD-10-CM

## 2022-01-06 DIAGNOSIS — Z89511 Acquired absence of right leg below knee: Secondary | ICD-10-CM

## 2022-01-06 LAB — BASIC METABOLIC PANEL
Anion gap: 9 (ref 5–15)
BUN: 17 mg/dL (ref 8–23)
CO2: 20 mmol/L — ABNORMAL LOW (ref 22–32)
Calcium: 8.1 mg/dL — ABNORMAL LOW (ref 8.9–10.3)
Chloride: 108 mmol/L (ref 98–111)
Creatinine, Ser: 1.19 mg/dL — ABNORMAL HIGH (ref 0.44–1.00)
GFR, Estimated: 49 mL/min — ABNORMAL LOW (ref >60)
Glucose, Bld: 199 mg/dL — ABNORMAL HIGH (ref 70–99)
Potassium: 4.5 mmol/L (ref 3.5–5.1)
Sodium: 137 mmol/L (ref 135–145)

## 2022-01-06 LAB — HEMOGLOBIN A1C
Hgb A1c MFr Bld: 10.3 % — ABNORMAL HIGH (ref 4.8–5.6)
Mean Plasma Glucose: 248.91 mg/dL

## 2022-01-06 LAB — RESP PANEL BY RT-PCR (FLU A&B, COVID) ARPGX2
Influenza A by PCR: NEGATIVE
Influenza B by PCR: NEGATIVE
SARS Coronavirus 2 by RT PCR: NEGATIVE

## 2022-01-06 LAB — CBC
HCT: 37.7 % (ref 36.0–46.0)
Hemoglobin: 11.7 g/dL — ABNORMAL LOW (ref 12.0–15.0)
MCH: 28.3 pg (ref 26.0–34.0)
MCHC: 31 g/dL (ref 30.0–36.0)
MCV: 91.3 fL (ref 80.0–100.0)
Platelets: 226 10*3/uL (ref 150–400)
RBC: 4.13 MIL/uL (ref 3.87–5.11)
RDW: 13 % (ref 11.5–15.5)
WBC: 12.4 10*3/uL — ABNORMAL HIGH (ref 4.0–10.5)
nRBC: 0 % (ref 0.0–0.2)

## 2022-01-06 LAB — GLUCOSE, CAPILLARY
Glucose-Capillary: 148 mg/dL — ABNORMAL HIGH (ref 70–99)
Glucose-Capillary: 198 mg/dL — ABNORMAL HIGH (ref 70–99)
Glucose-Capillary: 237 mg/dL — ABNORMAL HIGH (ref 70–99)
Glucose-Capillary: 250 mg/dL — ABNORMAL HIGH (ref 70–99)
Glucose-Capillary: 272 mg/dL — ABNORMAL HIGH (ref 70–99)
Glucose-Capillary: 319 mg/dL — ABNORMAL HIGH (ref 70–99)
Glucose-Capillary: 45 mg/dL — ABNORMAL LOW (ref 70–99)

## 2022-01-06 MED ORDER — HYDROCODONE-ACETAMINOPHEN 5-325 MG PO TABS: 1.0000 | ORAL_TABLET | Freq: Four times a day (QID) | ORAL | Status: DC | PRN

## 2022-01-06 MED ORDER — DIPHENHYDRAMINE HCL 12.5 MG/5ML PO ELIX: 25.0000 mg | ORAL_SOLUTION | Freq: Three times a day (TID) | ORAL | Status: DC | PRN

## 2022-01-06 MED ORDER — PROMETHAZINE HCL 25 MG/ML IJ SOLN: 6.2500 mg | INTRAMUSCULAR | Status: DC | PRN

## 2022-01-06 MED ORDER — FENTANYL CITRATE (PF) 100 MCG/2ML IJ SOLN: 25.0000 ug | INTRAMUSCULAR | Status: DC | PRN

## 2022-01-06 MED ORDER — LIVING WELL WITH DIABETES BOOK
Freq: Once | Status: AC
  Filled 2022-01-06: qty 1

## 2022-01-06 NOTE — Hospital Course (Signed)
71 year old female, lives alone, independent with bilateral lower extremity prosthesis, medical history significant for poorly controlled type II DM with peripheral neuropathy and retinopathy, HTN, HLD, osteomyelitis of bilateral feet s/p bilateral BKA, osteomyelitis of right middle finger s/p partial amputation, presented with progressive worsening of right index finger swelling and difficult to move after she developed a wound about 2 to 3 weeks ago.  Orthopedics was consulted and she underwent right index finger amputation through the middle phalanx on 1/15.

## 2022-01-06 NOTE — Assessment & Plan Note (Signed)
Orthopedics consultation and follow-up appreciated. 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 S/p right index finger amputation through the middle phalanx. NWB RUE Continue empirically started IV vancomycin and Zosyn Orthopedics will change dressing tomorrow.  Await their recommendations regarding discharge disposition. Outpatient follow-up with Dr. Mardelle Matte in 1 week.

## 2022-01-06 NOTE — Progress Notes (Signed)
Arrived to 5N15 from PACU, bedside handoff received from [place]. Patient is alert, oriented. Oriented to unit and room. VSS.

## 2022-01-06 NOTE — Progress Notes (Signed)
Patient with IV Vancomycin/Zoyn, left AC PIV had itching and redness noted. Patient stated that the IV in her left [facility] was hurting while vanc/zosyn infusing. Redness and edema noted at site. IV removed, Dr. Algis Liming notified of itching from vancomycin.  IV team consult order placed as patient has limited access due to surgery on right hand and very poor vasculature. Vanc/Zosyn paused at this time.

## 2022-01-06 NOTE — Assessment & Plan Note (Signed)
Follow CBC in AM.

## 2022-01-06 NOTE — Assessment & Plan Note (Signed)
Continue amlodipine, lisinopril and as needed oral hydralazine. Controlled.

## 2022-01-06 NOTE — Progress Notes (Signed)
PROGRESS NOTE   Diana Coleman  DJS:[id number]    DOB: 1951-10-27    DOA: 01/05/2022  PCP: Pcp, No   I have briefly reviewed patients previous medical records in [facility].  Chief Complaint  Patient presents with   Abscess    Hospital Course:  71 year old female, lives alone, independent with bilateral lower extremity prosthesis, medical history significant for poorly controlled type II DM with peripheral neuropathy and retinopathy, HTN, HLD, osteomyelitis of bilateral feet s/p bilateral BKA, osteomyelitis of right middle finger s/p partial amputation, presented with progressive worsening of right index finger swelling and difficult to move after she developed a wound about 2 to 3 weeks ago.  Orthopedics was consulted and she underwent right index finger amputation through the middle phalanx on 1/15.   Assessment & Plan:  Active Problems:   Finger osteomyelitis, right (HCC)   Type 2 diabetes mellitus with hyperlipidemia (HCC)   Hypertension complicating diabetes ([place])   Anemia   S/P BKA (below knee amputation) bilateral ([place])   Finger osteomyelitis, right [facility]) Orthopedics consultation and follow-up appreciated. 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 S/p right index finger amputation through the middle phalanx. NWB RUE Continue empirically started IV vancomycin and Zosyn Orthopedics will change dressing tomorrow.  Await their recommendations regarding discharge disposition. Outpatient follow-up with Dr. Mardelle Matte in 1 week.  Type 2 diabetes mellitus with hyperlipidemia ([place]) Complicated by hypoglycemia, peripheral neuropathy and retinopathy. A1c 10.3.  Poorly controlled. DM coordinator input appreciated.  A1c reportedly was 12% recently and patient has been working on getting it down. Reports occasional hypoglycemia in the morning if she eats late the night before.  CBG of 45 on 1/15  night. Discontinue metformin.  Continue reduced dose Semglee and SSI. Monitor CBGs closely and adjust insulins as needed. Continue statins.   Hypertension complicating diabetes ([place]) Continue amlodipine, lisinopril and as needed oral hydralazine. Controlled.  Anemia Follow CBC in AM.  S/P BKA (below knee amputation) bilateral (HCC) Lives alone.  Ambulates with the help of prosthesis.  Independent.    Body mass index is 33.46 kg/m.     DVT prophylaxis: enoxaparin (LOVENOX) injection 40 mg Start: 01/05/22 1815     Code Status: Full Code:  Family Communication: None at bedside. Disposition:  Status is: Inpatient  Remains inpatient appropriate because: Postsurgery.  IV antibiotics.        Consultants:   Orthopedics  Procedures:   As above  Antimicrobials:   IV Zosyn and vancomycin 1/15 >   Subjective:  Seen this morning.  Sensations returning to right hand after recent surgery.  At baseline has impaired sensations from peripheral neuropathy.  No pain reported.  Denies any other complaints.  Objective:   Vitals:   01/06/22 0650 01/06/22 0720 01/06/22 0748 01/06/22 1058  BP: (!) 100/59 135/79 (!) 141/69 (!) 130/59  Pulse: 90 84 87 79  Resp: (!) 24 16 16    Temp:   98.6 F (37 C)   TempSrc:   Oral   SpO2: 96% 99% 95%   Weight:      Height:        General exam: Elderly female, moderately built and nourished sitting up comfortably in bed without distress. Respiratory system: Clear to auscultation. Respiratory effort normal. Cardiovascular system: S1 & S2 heard, RRR. No JVD, murmurs, rubs, gallops or clicks. No pedal edema. Gastrointestinal system: Abdomen is nondistended, soft and nontender. No organomegaly or  masses felt. Normal bowel sounds heard. Central nervous system: Alert and oriented. No focal neurological deficits. Extremities: Symmetric 5 x 5 power.  Right middle finger distal phalanx s/p old amputation.  Right index finger postop dressing clean  and dry.  Bilateral BKA healed stumps. Skin: No rashes, lesions or ulcers Psychiatry: Judgement and insight appear normal. Mood & affect appropriate.     Data Reviewed:   I have personally reviewed following labs and imaging studies   CBC: Recent Labs  Lab 01/05/22 1530 01/06/22 0320  WBC 12.8* 12.4*  HGB 13.1 11.7*  HCT 40.4 37.7  MCV 88.4 91.3  PLT 248 500    Basic Metabolic Panel: Recent Labs  Lab 01/05/22 1530 01/06/22 0320  NA 134* 137  K 4.3 4.5  CL 101 108  CO2 25 20*  GLUCOSE 400* 199*  BUN 15 17  CREATININE 1.10* 1.19*  CALCIUM 8.5* 8.1*    Liver Function Tests: Recent Labs  Lab 01/05/22 1530  AST 12*  ALT 7  ALKPHOS 84  BILITOT 0.9  PROT 6.6  ALBUMIN 3.1*    CBG: Recent Labs  Lab 01/06/22 0629 01/06/22 0802 01/06/22 1218  GLUCAP 250* 272* 198*    Microbiology Studies:   Recent Results (from the past 240 hour(s))  Culture, blood (routine x 2)     Status: None (Preliminary result)   Collection Time: 01/05/22  5:40 PM   Specimen: BLOOD  Result Value Ref Range Status   Specimen Description BLOOD RIGHT ANTECUBITAL  Final   Special Requests   Final    BOTTLES DRAWN AEROBIC AND ANAEROBIC Blood Culture results may not be optimal due to an inadequate volume of blood received in culture bottles   Culture   Final    NO GROWTH < 24 HOURS Performed at [facility] [address], [place], [place] [address]    Report Status PENDING  Incomplete  Culture, blood (routine x 2)     Status: None (Preliminary result)   Collection Time: 01/05/22  5:45 PM   Specimen: BLOOD  Result Value Ref Range Status   Specimen Description BLOOD LEFT ANTECUBITAL  Final   Special Requests   Final    BOTTLES DRAWN AEROBIC AND ANAEROBIC Blood Culture results may not be optimal due to an inadequate volume of blood received in culture bottles   Culture   Final    NO GROWTH < 24 HOURS Performed at [facility], [place] [address] [place], [place] [address]     Report Status PENDING  Incomplete  Resp Panel by RT-PCR (Flu A&B, Covid) Nasopharyngeal Swab     Status: None   Collection Time: 01/05/22 11:44 PM   Specimen: Nasopharyngeal Swab; Nasopharyngeal(NP) swabs in vial transport medium  Result Value Ref Range Status   SARS Coronavirus 2 by RT PCR NEGATIVE NEGATIVE Final    Comment: (NOTE) SARS-CoV-2 target nucleic acids are NOT DETECTED.  The SARS-CoV-2 RNA is generally detectable in upper respiratory specimens during the acute phase of infection. The lowest concentration of SARS-CoV-2 viral copies this assay can detect is 138 copies/mL. A negative result does not preclude SARS-Cov-2 infection and should not be used as the sole basis for treatment or other patient management decisions. A negative result may occur with  improper specimen collection/handling, submission of specimen other than nasopharyngeal swab, presence of viral mutation(s) within the areas targeted by this assay, and inadequate number of viral copies(<138 copies/mL). A negative result must be combined with clinical observations,  patient history, and epidemiological information. The expected result is Negative.  Fact Sheet for Patients:  EntrepreneurPulse.com.au  Fact Sheet for Healthcare Providers:  IncredibleEmployment.be  This test is no t yet approved or cleared by the [place] FDA and  has been authorized for detection and/or diagnosis of SARS-CoV-2 by FDA under an Emergency Use Authorization (EUA). This EUA will remain  in effect (meaning this test can be used) for the duration of the COVID-19 declaration under Section 564(b)(1) of the Act, 21 U.S.C.section 360bbb-3(b)(1), unless the authorization is terminated  or revoked sooner.       Influenza A by PCR NEGATIVE NEGATIVE Final   Influenza B by PCR NEGATIVE NEGATIVE Final    Comment: (NOTE) The Xpert Xpress SARS-CoV-2/FLU/RSV plus assay is intended as an aid in the  diagnosis of influenza from Nasopharyngeal swab specimens and should not be used as a sole basis for treatment. Nasal washings and aspirates are unacceptable for Xpert Xpress SARS-CoV-2/FLU/RSV testing.  Fact Sheet for Patients: EntrepreneurPulse.com.au  Fact Sheet for Healthcare Providers: IncredibleEmployment.be  This test is not yet approved or cleared by the [place] FDA and has been authorized for detection and/or diagnosis of SARS-CoV-2 by FDA under an Emergency Use Authorization (EUA). This EUA will remain in effect (meaning this test can be used) for the duration of the COVID-19 declaration under Section 564(b)(1) of the Act, 21 U.S.C. section 360bbb-3(b)(1), unless the authorization is terminated or revoked.  Performed at [facility], [place] [address] [place], [place] [address]   Aerobic/Anaerobic Culture w Gram Stain (surgical/deep wound)     Status: None (Preliminary result)   Collection Time: 01/05/22 11:48 PM   Specimen: PATH Digit amputation; Tissue  Result Value Ref Range Status   Specimen Description TISSUE RIGHT FINGER  Final   Special Requests RIGHT INDEX FINGER  Final   Gram Stain   Final    NO SQUAMOUS EPITHELIAL CELLS SEEN FEW WBC SEEN NO ORGANISMS SEEN Performed at [facility], [address], [place], [place] [address]    Culture PENDING  Incomplete   Report Status PENDING  Incomplete    Radiology Studies:  DG Finger Index Right  Addendum Date: 01/05/2022   ADDENDUM REPORT: 01/05/2022 16:03 ADDENDUM: These results were called by telephone at the time of interpretation on 01/05/2022 at at 3:58 p.m. to provider Dr. Raul Del, who verbally acknowledged these results. Electronically Signed   By: Fidela Salisbury M.D.   On: 01/05/2022 16:03   Result Date: 01/05/2022 CLINICAL DATA:  Post amputation of the right middle finger because of abscess. EXAM: RIGHT INDEX FINGER 2+V COMPARISON:  October 03, 2016  FINDINGS: Post amputation of the distal third phalanx chest distal to the D IP joint. March soft tissue swelling and marked subcutaneous emphysema centered at the amputation site and extending along the third digit. IMPRESSION: 1. Post amputation of the distal third phalanx distal to the D IP joint. 2. Marked soft tissue swelling and marked subcutaneous emphysema centered at the amputation site and extending along the third digit. Findings are concerning for gaseous gangrene of the finger. Electronically Signed: By: Fidela Salisbury M.D. On: 01/05/2022 15:55    Scheduled Meds:    amLODipine  10 mg Oral Daily   atorvastatin  10 mg Oral Daily   enoxaparin (LOVENOX) injection  40 mg Subcutaneous Q24H   insulin aspart  0-15 Units Subcutaneous TID WC   insulin aspart  0-5 Units Subcutaneous QHS   insulin glargine-yfgn  28  Units Subcutaneous Daily   lisinopril  10 mg Oral Daily    Continuous Infusions:    piperacillin-tazobactam (ZOSYN)  IV 3.375 g (01/06/22 1104)   vancomycin       LOS: 1 day     Vernell Leep, MD,  FACP, [facility], [facility], [facility] (Care Management Physician Certified) [place]  To contact the attending provider between 7A-7P or the covering provider during after hours 7P-7A, please log into the web site [email or web address] and access using universal [place] password for that web site. If you do not have the password, please call the hospital operator.  01/06/2022, 1:32 PM

## 2022-01-06 NOTE — Assessment & Plan Note (Signed)
Lives alone.  Ambulates with the help of prosthesis.  Independent.

## 2022-01-06 NOTE — Discharge Instructions (Addendum)
Diet: As you were doing prior to hospitalization   Shower:  May shower but keep the wounds dry, use an occlusive plastic wrap, NO SOAKING IN TUB.  If the bandage gets wet, change with a clean dry gauze.  If you have a splint on, leave the splint in place and keep the splint dry with a plastic bag.  Dressing:  You may change your dressing 3-5 days after surgery, unless you have a splint.  If you have a splint, then just leave the splint in place and we will change your bandages during your first follow-up appointment.    If you had hand or foot surgery, we will plan to remove your stitches in about 2 weeks in the office.  For all other surgeries, there are sticky tapes (steri-strips) on your wounds and all the stitches are absorbable.  Leave the steri-strips in place when changing your dressings, they will peel off with time, usually 2-3 weeks.  Activity:  Increase activity slowly as tolerated, but follow the weight bearing instructions below.  The rules on driving is that you can not be taking narcotics while you drive, and you must feel in control of the vehicle.    Weight Bearing:   No bearing weight with right hand  To prevent constipation: you may use a stool softener such as -  Colace (over the counter) 100 mg by mouth twice a day  Drink plenty of fluids (prune juice may be helpful) and high fiber foods Miralax (over the counter) for constipation as needed.    Itching:  If you experience itching with your medications, try taking only a single pain pill, or even half a pain pill at a time.  You may take up to 10 pain pills per day, and you can also use benadryl over the counter for itching or also to help with sleep.   Precautions:  If you experience chest pain or shortness of breath - call 911 immediately for transfer to the hospital emergency department!!  If you develop a fever greater that 101 F, purulent drainage from wound, increased redness or drainage from wound, or calf pain --  Call the office at [phone]                                                Follow- Up Appointment:  Please call for an appointment to be seen in 2 weeks [facility] - [phone]  Additional discharge instructions  Please get your medications reviewed and adjusted by your Primary MD.  Please request your Primary MD to go over all Hospital Tests and Procedure/Radiological results at the follow up, please get all Hospital records sent to your Prim MD by signing hospital release before you go home.  If you had Pneumonia of Lung problems at the Hospital: Please get a 2 view Chest X ray done in 6-8 weeks after hospital discharge or sooner if instructed by your Primary MD.  If you have Congestive Heart Failure: Please call your Cardiologist or Primary MD anytime you have any of the following symptoms:  1) 3 pound weight gain in 24 hours or 5 pounds in 1 week  2) shortness of breath, with or without a dry hacking cough  3) swelling in the hands, feet or stomach  4) if you have to sleep on extra pillows at night in order to breathe  Follow cardiac low salt diet and 1.5 lit/day fluid restriction.  If you have diabetes Accuchecks 4 times/day, Once in AM empty stomach and then before each meal. Log in all results and show them to your primary doctor at your next visit. If any glucose reading is under 80 or above 300 call your primary MD immediately.  If you have Seizure/Convulsions/Epilepsy: Please do not drive, operate heavy machinery, participate in activities at heights or participate in high speed sports until you have seen by Primary MD or a Neurologist and advised to do so again.  If you had Gastrointestinal Bleeding: Please ask your Primary MD to check a complete blood count within one week of discharge or at your next visit. Your endoscopic/colonoscopic biopsies that are pending at the time of discharge, will also need to followed by your Primary MD.  Get Medicines reviewed and  adjusted. Please take all your medications with you for your next visit with your Primary MD  Please request your Primary MD to go over all hospital tests and procedure/radiological results at the follow up, please ask your Primary MD to get all Hospital records sent to his/her office.  If you experience worsening of your admission symptoms, develop shortness of breath, life threatening emergency, suicidal or homicidal thoughts you must seek medical attention immediately by calling 911 or calling your MD immediately  if symptoms less severe.  You must read complete instructions/literature along with all the possible adverse reactions/side effects for all the Medicines you take and that have been prescribed to you. Take any new Medicines after you have completely understood and accpet all the possible adverse reactions/side effects.   Do not drive or operate heavy machinery when taking Pain medications.   Do not take more than prescribed Pain, Sleep and Anxiety Medications  Special Instructions: If you have smoked or chewed Tobacco  in the last 2 yrs please stop smoking, stop any regular Alcohol  and or any Recreational drug use.  Wear Seat belts while driving.  Please note You were cared for by a hospitalist during your hospital stay. If you have any questions about your discharge medications or the care you received while you were in the hospital after you are discharged, you can call the unit and asked to speak with the hospitalist on call if the hospitalist that took care of you is not available. Once you are discharged, your primary care physician will handle any further medical issues. Please note that NO REFILLS for any discharge medications will be authorized once you are discharged, as it is imperative that you return to your primary care physician (or establish a relationship with a primary care physician if you do not have one) for your aftercare needs so that they can reassess your need  for medications and monitor your lab values.  You can reach the hospitalist office at phone [phone] or fax [phone]   If you do not have a primary care physician, you can call [phone] for a physician referral.

## 2022-01-06 NOTE — Transfer of Care (Signed)
Immediate Anesthesia Transfer of Care Note  Patient: [facility]  Procedure(s) Performed: IRRIGATION AND DEBRIDEMENT RIGHT INDEX FINGER (Right: Finger) AMPUTATION TIP OF RIGHT INDEX FINGER (Right: Finger)  Patient Location: PACU  Anesthesia Type:General  Level of Consciousness: awake, alert  and oriented  Airway & Oxygen Therapy: Patient Spontanous Breathing and Patient connected to nasal cannula oxygen  Post-op Assessment: Report given to RN and Post -op Vital signs reviewed and stable  Post vital signs: Reviewed and stable  Last Vitals:  Vitals Value Taken Time  BP    Temp    Pulse    Resp    SpO2      Last Pain:  Vitals:   01/05/22 1855  PainSc: 8          Complications: No notable events documented.

## 2022-01-06 NOTE — Anesthesia Postprocedure Evaluation (Signed)
Anesthesia Post Note  Patient: [facility]  Procedure(s) Performed: IRRIGATION AND DEBRIDEMENT RIGHT INDEX FINGER (Right: Finger) AMPUTATION TIP OF RIGHT INDEX FINGER (Right: Finger)     Patient location during evaluation: PACU Anesthesia Type: General Level of consciousness: sedated and patient cooperative Pain management: pain level controlled Vital Signs Assessment: post-procedure vital signs reviewed and stable Respiratory status: spontaneous breathing Cardiovascular status: stable Anesthetic complications: no   No notable events documented.  Last Vitals:  Vitals:   01/06/22 1058 01/06/22 1928  BP: (!) 130/59 (!) 104/58  Pulse: 79 80  Resp:  20  Temp:  37.7 C  SpO2:  96%    Last Pain:  Vitals:   01/06/22 1928  TempSrc: Oral  PainSc:                  Nolon Nations

## 2022-01-06 NOTE — Assessment & Plan Note (Signed)
Complicated by hypoglycemia, peripheral neuropathy and retinopathy. A1c 10.3.  Poorly controlled. DM coordinator input appreciated.  A1c reportedly was 12% recently and patient has been working on getting it down. Reports occasional hypoglycemia in the morning if she eats late the night before.  CBG of 45 on 1/15 night. Discontinue metformin.  Continue reduced dose Semglee and SSI. Monitor CBGs closely and adjust insulins as needed. Continue statins.

## 2022-01-06 NOTE — Progress Notes (Addendum)
Inpatient Diabetes Program Recommendations  AACE/ADA: New Consensus Statement on Inpatient Glycemic Control (2015)  Target Ranges:  Prepandial:   less than 140 mg/dL      Peak postprandial:   less than 180 mg/dL (1-2 hours)      Critically ill patients:  140 - 180 mg/dL   Lab Results  Component Value Date   GLUCAP 272 (H) 01/06/2022   HGBA1C 10.3 (H) 01/06/2022    Review of Glycemic Control  Latest Reference Range & Units 01/05/22 22:39 01/06/22 00:27 01/06/22 06:29 01/06/22 08:02  Glucose-Capillary 70 - 99 mg/dL 45 (L) 148 (H) 250 (H) 272 (H)  (L): Data is abnormally low (H): Data is abnormally high  Diabetes history: DM2 Outpatient Diabetes medications: Tresiba 35 units QHS, Metformin 0932 mg BID, Trulicity 4.5 weekly Current orders for Inpatient glycemic control: Semglee 28 units QD, Novolog 0-15 units tID and 0-5 units QHS, Metformin 1000 mg BID  Ordered Living Well With Diabetes booklet.  Spoke with patient at bedside.  She is current with her PCP and sees him every 2 months.  Reviewed patient's current A1c of 10.3% (average blood sugar of 249 mg/dL). Explained what a A1c is and what it measures. Also reviewed goal A1c with patient, importance of good glucose control @ home, and blood sugar goals.  She states it was 12% recently and she has been working on getting it down.  She does not drink beverages with sugar and has stopped snacking and is eating healthier.  She has recently started checking her CBGs daily.  Confirms above home medications.  Denies difficulties obtaining medications.    Has occasional low blood sugars in the morning if she has eaten light the night before.  She treats with juice.    Will continue to follow while inpatient.  Thank you, Reche Dixon, MSN, RN Diabetes Coordinator Inpatient Diabetes Program [phone] (team pager from 8a-5p)

## 2022-01-06 NOTE — Progress Notes (Addendum)
Subjective: 1 Day Post-Op s/p Procedure(s): IRRIGATION AND DEBRIDEMENT RIGHT INDEX FINGER AMPUTATION TIP OF RIGHT INDEX FINGER   Patient is alert, oriented, sitting up in bed. Pain is mild at left index finger, but she states she can feel that digital block has worn off. Denies chest pain, SOB, Calf pain. No nausea/vomiting. No other complaints.   Objective:  PE: VITALS:   Vitals:   01/06/22 0650 01/06/22 0720 01/06/22 0748 01/06/22 1058  BP: (!) 100/59 135/79 (!) 141/69 (!) 130/59  Pulse: 90 84 87 79  Resp: (!) 24 16 16    Temp:   98.6 F (37 C)   TempSrc:   Oral   SpO2: 96% 99% 95%   Weight:      Height:       General: sitting up in bed, in no acute distress MSK: Dressing intact without drainage, will leave in place today. Able to flex and extend at MCP joint without pain. Able to flex and extend all other fingers of left hand.  LABS  Results for orders placed or performed during the hospital encounter of 01/05/22 (from the past 24 hour(s))  Comprehensive metabolic panel     Status: Abnormal   Collection Time: 01/05/22  3:30 PM  Result Value Ref Range   Sodium 134 (L) 135 - 145 mmol/L   Potassium 4.3 3.5 - 5.1 mmol/L   Chloride 101 98 - 111 mmol/L   CO2 25 22 - 32 mmol/L   Glucose, Bld 400 (H) 70 - 99 mg/dL   BUN 15 8 - 23 mg/dL   Creatinine, Ser 1.10 (H) 0.44 - 1.00 mg/dL   Calcium 8.5 (L) 8.9 - 10.3 mg/dL   Total Protein 6.6 6.5 - 8.1 g/dL   Albumin 3.1 (L) 3.5 - 5.0 g/dL   AST 12 (L) 15 - 41 U/L   ALT 7 0 - 44 U/L   Alkaline Phosphatase 84 38 - 126 U/L   Total Bilirubin 0.9 0.3 - 1.2 mg/dL   GFR, Estimated 53 (L) >60 mL/min   Anion gap 8 5 - 15  CBC     Status: Abnormal   Collection Time: 01/05/22  3:30 PM  Result Value Ref Range   WBC 12.8 (H) 4.0 - 10.5 K/uL   RBC 4.57 3.87 - 5.11 MIL/uL   Hemoglobin 13.1 12.0 - 15.0 g/dL   HCT 40.4 36.0 - 46.0 %   MCV 88.4 80.0 - 100.0 fL   MCH 28.7 26.0 - 34.0 pg   MCHC 32.4 30.0 - 36.0 g/dL   RDW 12.8 11.5 -  15.5 %   Platelets 248 150 - 400 K/uL   nRBC 0.0 0.0 - 0.2 %  Lactic acid, plasma     Status: None   Collection Time: 01/05/22  4:00 PM  Result Value Ref Range   Lactic Acid, Venous 1.7 0.5 - 1.9 mmol/L  Culture, blood (routine x 2)     Status: None (Preliminary result)   Collection Time: 01/05/22  5:40 PM   Specimen: BLOOD  Result Value Ref Range   Specimen Description BLOOD RIGHT ANTECUBITAL    Special Requests      BOTTLES DRAWN AEROBIC AND ANAEROBIC Blood Culture results may not be optimal due to an inadequate volume of blood received in culture bottles   Culture      NO GROWTH < 24 HOURS Performed at [facility], [address] [place], [place] [address]    Report Status PENDING  Culture, blood (routine x 2)     Status: None (Preliminary result)   Collection Time: 01/05/22  5:45 PM   Specimen: BLOOD  Result Value Ref Range   Specimen Description BLOOD LEFT ANTECUBITAL    Special Requests      BOTTLES DRAWN AEROBIC AND ANAEROBIC Blood Culture results may not be optimal due to an inadequate volume of blood received in culture bottles   Culture      NO GROWTH < 24 HOURS Performed at [facility] [address], [place], [place] [address]    Report Status PENDING   Glucose, capillary     Status: Abnormal   Collection Time: 01/05/22 10:39 PM  Result Value Ref Range   Glucose-Capillary 45 (L) 70 - 99 mg/dL  Resp Panel by RT-PCR (Flu A&B, Covid) Nasopharyngeal Swab     Status: None   Collection Time: 01/05/22 11:44 PM   Specimen: Nasopharyngeal Swab; Nasopharyngeal(NP) swabs in vial transport medium  Result Value Ref Range   SARS Coronavirus 2 by RT PCR NEGATIVE NEGATIVE   Influenza A by PCR NEGATIVE NEGATIVE   Influenza B by PCR NEGATIVE NEGATIVE  Aerobic/Anaerobic Culture w Gram Stain (surgical/deep wound)     Status: None (Preliminary result)   Collection Time: 01/05/22 11:48 PM   Specimen: PATH Digit amputation; Tissue  Result Value Ref Range   Specimen  Description TISSUE RIGHT FINGER    Special Requests RIGHT INDEX FINGER    Gram Stain      NO SQUAMOUS EPITHELIAL CELLS SEEN FEW WBC SEEN NO ORGANISMS SEEN Performed at [facility], [address], [place], [place] [address]    Culture PENDING    Report Status PENDING   Glucose, capillary     Status: Abnormal   Collection Time: 01/06/22 12:27 AM  Result Value Ref Range   Glucose-Capillary 148 (H) 70 - 99 mg/dL  Basic metabolic panel     Status: Abnormal   Collection Time: 01/06/22  3:20 AM  Result Value Ref Range   Sodium 137 135 - 145 mmol/L   Potassium 4.5 3.5 - 5.1 mmol/L   Chloride 108 98 - 111 mmol/L   CO2 20 (L) 22 - 32 mmol/L   Glucose, Bld 199 (H) 70 - 99 mg/dL   BUN 17 8 - 23 mg/dL   Creatinine, Ser 1.19 (H) 0.44 - 1.00 mg/dL   Calcium 8.1 (L) 8.9 - 10.3 mg/dL   GFR, Estimated 49 (L) >60 mL/min   Anion gap 9 5 - 15  CBC     Status: Abnormal   Collection Time: 01/06/22  3:20 AM  Result Value Ref Range   WBC 12.4 (H) 4.0 - 10.5 K/uL   RBC 4.13 3.87 - 5.11 MIL/uL   Hemoglobin 11.7 (L) 12.0 - 15.0 g/dL   HCT 37.7 36.0 - 46.0 %   MCV 91.3 80.0 - 100.0 fL   MCH 28.3 26.0 - 34.0 pg   MCHC 31.0 30.0 - 36.0 g/dL   RDW 13.0 11.5 - 15.5 %   Platelets 226 150 - 400 K/uL   nRBC 0.0 0.0 - 0.2 %  Hemoglobin A1c     Status: Abnormal   Collection Time: 01/06/22  3:20 AM  Result Value Ref Range   Hgb A1c MFr Bld 10.3 (H) 4.8 - 5.6 %   Mean Plasma Glucose 248.91 mg/dL  Glucose, capillary     Status: Abnormal   Collection Time: 01/06/22  6:29 AM  Result Value Ref Range  Glucose-Capillary 250 (H) 70 - 99 mg/dL  Glucose, capillary     Status: Abnormal   Collection Time: 01/06/22  8:02 AM  Result Value Ref Range   Glucose-Capillary 272 (H) 70 - 99 mg/dL    DG Finger Index Right  Addendum Date: 01/05/2022   ADDENDUM REPORT: 01/05/2022 16:03 ADDENDUM: These results were called by telephone at the time of interpretation on 01/05/2022 at at 3:58 p.m. to provider Dr.  Raul Del, who verbally acknowledged these results. Electronically Signed   By: Fidela Salisbury M.D.   On: 01/05/2022 16:03   Result Date: 01/05/2022 CLINICAL DATA:  Post amputation of the right middle finger because of abscess. EXAM: RIGHT INDEX FINGER 2+V COMPARISON:  October 03, 2016 FINDINGS: Post amputation of the distal third phalanx chest distal to the D IP joint. March soft tissue swelling and marked subcutaneous emphysema centered at the amputation site and extending along the third digit. IMPRESSION: 1. Post amputation of the distal third phalanx distal to the D IP joint. 2. Marked soft tissue swelling and marked subcutaneous emphysema centered at the amputation site and extending along the third digit. Findings are concerning for gaseous gangrene of the finger. Electronically Signed: By: Fidela Salisbury M.D. 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 Follow - up plan: with Dr. Mardelle Matte 1 week after discharge  Contact information:   Weekdays 8-5 Merlene Pulling, PA-C [phone] A fter hours and holidays please check [email or web address] for group call information for [facility]  Ventura Bruns 01/06/2022, 12:06 PM

## 2022-01-07 ENCOUNTER — Encounter (HOSPITAL_COMMUNITY): Payer: Self-pay | Admitting: Orthopedic Surgery

## 2022-01-07 ENCOUNTER — Ambulatory Visit: Payer: Medicare Other

## 2022-01-07 LAB — BLOOD CULTURE ID PANEL (REFLEXED) - BCID2

## 2022-01-07 LAB — GLUCOSE, CAPILLARY
Glucose-Capillary: 216 mg/dL — ABNORMAL HIGH (ref 70–99)
Glucose-Capillary: 162 mg/dL — ABNORMAL HIGH (ref 70–99)
Glucose-Capillary: 244 mg/dL — ABNORMAL HIGH (ref 70–99)
Glucose-Capillary: 267 mg/dL — ABNORMAL HIGH (ref 70–99)

## 2022-01-07 LAB — BASIC METABOLIC PANEL
Anion gap: 7 (ref 5–15)
BUN: 19 mg/dL (ref 8–23)
CO2: 21 mmol/L — ABNORMAL LOW (ref 22–32)
Calcium: 8.2 mg/dL — ABNORMAL LOW (ref 8.9–10.3)
Chloride: 106 mmol/L (ref 98–111)
Creatinine, Ser: 1.17 mg/dL — ABNORMAL HIGH (ref 0.44–1.00)
GFR, Estimated: 50 mL/min — ABNORMAL LOW (ref >60)
Glucose, Bld: 284 mg/dL — ABNORMAL HIGH (ref 70–99)
Potassium: 3.9 mmol/L (ref 3.5–5.1)
Sodium: 134 mmol/L — ABNORMAL LOW (ref 135–145)

## 2022-01-07 LAB — CBC
HCT: 33.2 % — ABNORMAL LOW (ref 36.0–46.0)
Hemoglobin: 11 g/dL — ABNORMAL LOW (ref 12.0–15.0)
MCH: 29.7 pg (ref 26.0–34.0)
MCHC: 33.1 g/dL (ref 30.0–36.0)
MCV: 89.7 fL (ref 80.0–100.0)
Platelets: 207 10*3/uL (ref 150–400)
RBC: 3.7 MIL/uL — ABNORMAL LOW (ref 3.87–5.11)
RDW: 12.8 % (ref 11.5–15.5)
WBC: 10.4 10*3/uL (ref 4.0–10.5)
nRBC: 0 % (ref 0.0–0.2)

## 2022-01-07 MED ORDER — INSULIN GLARGINE-YFGN 100 UNIT/ML ~~LOC~~ SOLN
35.0000 [IU] | Freq: Every day | SUBCUTANEOUS | Status: DC
  Administered 2022-01-08: 35 [IU] via SUBCUTANEOUS
  Filled 2022-01-07 (×2): qty 0.35

## 2022-01-07 MED ORDER — ACETAMINOPHEN 325 MG PO TABS: 650.0000 mg | ORAL_TABLET | Freq: Four times a day (QID) | ORAL | Status: DC | PRN

## 2022-01-07 MED ORDER — SODIUM CHLORIDE 0.9 % IV SOLN
2.0000 g | Freq: Two times a day (BID) | INTRAVENOUS | Status: DC
  Administered 2022-01-07 – 2022-01-08 (×3): 2 g via INTRAVENOUS
  Filled 2022-01-07 (×3): qty 2

## 2022-01-07 NOTE — Plan of Care (Signed)

## 2022-01-07 NOTE — Discharge Summary (Signed)
Physician Discharge Summary  Diana Coleman QMG:[id number] DOB: 1951/05/23  PCP: Diana Nova, MD  Admitted from: Home Discharged to: Home  Admit date: 01/05/2022 Discharge date: 01/08/2022  Recommendations for Outpatient Follow-up:    Follow-up Information     Diana Bond, MD. Schedule an appointment as soon as possible for a visit in 1 week(s).   Specialty: Orthopedic Surgery Contact information: [address] [place] [address] [phone]         Diana Nova, MD Follow up in 1 week(s).   Specialty: Family Medicine Why: To be seen with repeat labs (CBC & BMP). Contact information: [place] [address] [phone]                  Home Health: None    Equipment/Devices: None    Discharge Condition: Improved and stable   Code Status: Full Code Diet recommendation:  Discharge Diet Orders (From admission, onward)     Start     Ordered   01/07/22 0000  Diet - low sodium heart healthy        01/07/22 1700   01/07/22 0000  Diet Carb Modified        01/07/22 1700             Discharge Diagnoses:  Active Problems:   Finger osteomyelitis, right (HCC)   Type 2 diabetes mellitus with hyperlipidemia (HCC)   Hypertension complicating diabetes (HCC)   Anemia   S/P BKA (below knee amputation) bilateral (HCC)   Brief Summary: 71 year old female, lives alone, independent with bilateral lower extremity prosthesis, medical history significant for poorly controlled type II DM with peripheral neuropathy and retinopathy, HTN, HLD, osteomyelitis of bilateral feet s/p bilateral BKA, osteomyelitis of right middle finger s/p partial amputation, presented with progressive worsening of right index finger swelling and difficult to move after she developed a wound about 2 to 3 weeks ago.  Orthopedics was consulted and she underwent right index finger amputation through the middle phalanx on 1/15.  .     Assessment &  Plan:   Finger osteomyelitis, right [facility]) Orthopedics consultation and follow-up appreciated. 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 S/p right index finger amputation through the middle phalanx. NWB RUE Continue empirically started IV vancomycin and Zosyn Zosyn switched to cefepime today. Blood cultures x2: Negative to date.  Right index finger Gram stain without organisms, few WBCs seen.  Culture reincubated for better growth. Orthopedics changed dressing this morning and cleared patient for discharge but did not communicate with [facility] MD.  Patient states that it is quite late for her to DC now and wishes to leave in the a.m. Outpatient follow-up with Dr. Mardelle Coleman in 1 week. I discussed with Dr. Mardelle Coleman, recommends no antibiotics at discharge since he feels he had clean margin post op, keep current postop dressing until outpatient follow-up with him in 1 week.   Type 2 diabetes mellitus with hyperlipidemia ([place]) Complicated by hypoglycemia, peripheral neuropathy and retinopathy. A1c 10.3.  Poorly controlled. DM coordinator input appreciated.  A1c reportedly was 12% recently and patient has been working on getting it down. Reports occasional hypoglycemia in the morning if she eats late the night before.  CBG of 45 on 1/15 night. Blood glucose up to 284 this morning.  Agree with DM coordinator input.  Increased Lantus up to patient's home dose of 35 units daily.  Since then CBGs better controlled.  Resume other prior diabetic meds at discharge. Continue statins. Recommended close follow-up with PCP and better DM control to avoid complications    Hypertension complicating diabetes ([place]) Continue amlodipine, lisinopril and as needed oral hydralazine. Controlled.   Anemia Stable   S/P BKA (below knee amputation) bilateral (HCC) Lives alone.  Ambulates with the help of prosthesis.  Independent.   Body  mass index is 33.46 kg/m.       Consultants:   Orthopedics   Procedures:   As above   Discharge Instructions  Discharge Instructions     Call MD for:  difficulty breathing, headache or visual disturbances   Complete by: As directed    Call MD for:  extreme fatigue   Complete by: As directed    Call MD for:  persistant dizziness or light-headedness   Complete by: As directed    Call MD for:  persistant nausea and vomiting   Complete by: As directed    Call MD for:  redness, tenderness, or signs of infection (pain, swelling, redness, odor or green/yellow discharge around incision site)   Complete by: As directed    Call MD for:  severe uncontrolled pain   Complete by: As directed    Call MD for:  temperature >100.4   Complete by: As directed    Diet - low sodium heart healthy   Complete by: As directed    Diet Carb Modified   Complete by: As directed    Discharge wound care:   Complete by: As directed    Postop dressing to finger was changed on 01/07/2022.  Keep the same dressing until outpatient follow-up with Dr. Mardelle Coleman.  Avoid soiling the dressing or getting it wet.   Increase activity slowly   Complete by: As directed         Medication List     TAKE these medications    acetaminophen 325 MG tablet Commonly known as: TYLENOL Take 2 tablets (650 mg total) by mouth every 6 (six) hours as needed for mild pain, moderate pain or fever (or Fever >/= 101).   amLODipine 10 MG tablet Commonly known as: NORVASC Take 10 mg by mouth daily.   atorvastatin 10 MG tablet Commonly known as: LIPITOR Take 10 mg by mouth daily.   Dulaglutide 1.5 MG/0.5ML Sopn Inject 4.5 mg into the skin every Thursday.   insulin degludec 100 UNIT/ML FlexTouch Pen Commonly known as: TRESIBA Inject 35 Units into the skin daily.   lisinopril 10 MG tablet Commonly known as: ZESTRIL Take 1 tablet (10 mg total) by mouth daily.   metFORMIN 1000 MG tablet Commonly known as:  GLUCOPHAGE Take 1,000 mg by mouth 2 (two) times daily with a meal.       Allergies  Allergen Reactions   Clindamycin/Lincomycin Itching   Codeine Itching      Procedures/Studies: DG Finger Index Right  Addendum Date: 01/05/2022   ADDENDUM REPORT: 01/05/2022 16:03 ADDENDUM: These results were called by telephone at the time of interpretation on 01/05/2022 at at 3:58 p.m. to provider Dr. Raul Del, who verbally acknowledged these results. Electronically Signed   By: Fidela Salisbury M.D.   On: 01/05/2022 16:03   Result Date: 01/05/2022 CLINICAL DATA:  Post amputation of the right middle finger because of abscess. EXAM: RIGHT INDEX FINGER 2+V COMPARISON:  October 03, 2016 FINDINGS: Post amputation of the distal third phalanx chest distal to the D IP joint. March soft tissue swelling and marked subcutaneous emphysema  centered at the amputation site and extending along the third digit. IMPRESSION: 1. Post amputation of the distal third phalanx distal to the D IP joint. 2. Marked soft tissue swelling and marked subcutaneous emphysema centered at the amputation site and extending along the third digit. Findings are concerning for gaseous gangrene of the finger. Electronically Signed: By: Fidela Salisbury M.D. On: 01/05/2022 15:55      Subjective: Seen this morning.  Denies complaints.  No pain.  Has bilateral lower extremities prosthesis on and was able to get to the bathroom and back by herself.  Discharge Exam:  Vitals:   01/06/22 1058 01/06/22 1928 01/07/22 0448 01/07/22 0910  BP: (!) 130/59 (!) 104/58 (!) 125/58 115/66  Pulse: 79 80 86 86  Resp:  20 18 18   Temp:  99.9 F (37.7 C) 98.9 F (37.2 C) 98.2 F (36.8 C)  TempSrc:  Oral Oral Oral  SpO2:  96% 96% 97%  Weight:      Height:        General exam: Elderly female, moderately built and nourished sitting up comfortably in bed without distress. Respiratory system: Clear to auscultation. Respiratory effort  normal. Cardiovascular system: S1 & S2 heard, RRR. No JVD, murmurs, rubs, gallops or clicks. No pedal edema. Gastrointestinal system: Abdomen is nondistended, soft and nontender. No organomegaly or masses felt. Normal bowel sounds heard. Central nervous system: Alert and oriented. No focal neurological deficits. Extremities: Symmetric 5 x 5 power.  Right middle finger distal phalanx s/p old amputation.  Right index finger postop dressing clean and dry.  Bilateral BKA now with her prosthesis on. Skin: No rashes, lesions or ulcers Psychiatry: Judgement and insight appear normal. Mood & affect appropriate.     The results of significant diagnostics from this hospitalization (including imaging, microbiology, ancillary and laboratory) are listed below for reference.     Microbiology: Recent Results (from the past 240 hour(s))  Culture, blood (routine x 2)     Status: None (Preliminary result)   Collection Time: 01/05/22  5:40 PM   Specimen: BLOOD  Result Value Ref Range Status   Specimen Description BLOOD RIGHT ANTECUBITAL  Final   Special Requests   Final    BOTTLES DRAWN AEROBIC AND ANAEROBIC Blood Culture results may not be optimal due to an inadequate volume of blood received in culture bottles   Culture  Setup Time   Final    GRAM POSITIVE COCCI ANAEROBIC BOTTLE ONLY CRITICAL RESULT CALLED TO, READ BACK BY AND VERIFIED WITH: PHARM JAMES LEDFORD 01/07/2022 @0112  BY JW    Culture   Final    NO GROWTH 1 DAY Performed at [facility], [place] [address], [place], [place] [address]    Report Status PENDING  Incomplete  Blood Culture ID Panel (Reflexed)     Status: None   Collection Time: 01/05/22  5:40 PM  Result Value Ref Range Status   Enterococcus faecalis NOT DETECTED NOT DETECTED Final   Enterococcus Faecium NOT DETECTED NOT DETECTED Final   Listeria monocytogenes NOT DETECTED NOT DETECTED Final    Comment: RESULTS CALLED TO: PHARMD JAMES LEDFORD 01/07/22 @0112  BY JW    Staphylococcus species NOT DETECTED NOT DETECTED Final   Staphylococcus aureus (BCID) NOT DETECTED NOT DETECTED Final   Staphylococcus epidermidis NOT DETECTED NOT DETECTED Final   Staphylococcus lugdunensis NOT DETECTED NOT DETECTED Final   Streptococcus species NOT DETECTED NOT DETECTED Final   Streptococcus agalactiae NOT DETECTED NOT DETECTED Final   Streptococcus pneumoniae NOT  DETECTED NOT DETECTED Final   Streptococcus pyogenes NOT DETECTED NOT DETECTED Final   A.calcoaceticus-baumannii NOT DETECTED NOT DETECTED Final   Bacteroides fragilis NOT DETECTED NOT DETECTED Final   Enterobacterales NOT DETECTED NOT DETECTED Final   Enterobacter cloacae complex NOT DETECTED NOT DETECTED Final   Escherichia coli NOT DETECTED NOT DETECTED Final   Klebsiella aerogenes NOT DETECTED NOT DETECTED Final   Klebsiella oxytoca NOT DETECTED NOT DETECTED Final   Klebsiella pneumoniae NOT DETECTED NOT DETECTED Final   Proteus species NOT DETECTED NOT DETECTED Final   Salmonella species NOT DETECTED NOT DETECTED Final   Serratia marcescens NOT DETECTED NOT DETECTED Final   Haemophilus influenzae NOT DETECTED NOT DETECTED Final   Neisseria meningitidis NOT DETECTED NOT DETECTED Final   Pseudomonas aeruginosa NOT DETECTED NOT DETECTED Final   Stenotrophomonas maltophilia NOT DETECTED NOT DETECTED Final   Candida albicans NOT DETECTED NOT DETECTED Final   Candida auris NOT DETECTED NOT DETECTED Final   Candida glabrata NOT DETECTED NOT DETECTED Final   Candida krusei NOT DETECTED NOT DETECTED Final   Candida parapsilosis NOT DETECTED NOT DETECTED Final   Candida tropicalis NOT DETECTED NOT DETECTED Final   Cryptococcus neoformans/gattii NOT DETECTED NOT DETECTED Final    Comment: Performed at [facility], [address], [place], [place] [address]  Culture, blood (routine x 2)     Status: None (Preliminary result)   Collection Time: 01/05/22  5:45 PM   Specimen: BLOOD  Result Value Ref Range  Status   Specimen Description BLOOD LEFT ANTECUBITAL  Final   Special Requests   Final    BOTTLES DRAWN AEROBIC AND ANAEROBIC Blood Culture results may not be optimal due to an inadequate volume of blood received in culture bottles   Culture   Final    NO GROWTH 2 DAYS Performed at [facility], [place] [address] [place], [place] [address]    Report Status PENDING  Incomplete  Resp Panel by RT-PCR (Flu A&B, Covid) Nasopharyngeal Swab     Status: None   Collection Time: 01/05/22 11:44 PM   Specimen: Nasopharyngeal Swab; Nasopharyngeal(NP) swabs in vial transport medium  Result Value Ref Range Status   SARS Coronavirus 2 by RT PCR NEGATIVE NEGATIVE Final    Comment: (NOTE) SARS-CoV-2 target nucleic acids are NOT DETECTED.  The SARS-CoV-2 RNA is generally detectable in upper respiratory specimens during the acute phase of infection. The lowest concentration of SARS-CoV-2 viral copies this assay can detect is 138 copies/mL. A negative result does not preclude SARS-Cov-2 infection and should not be used as the sole basis for treatment or other patient management decisions. A negative result may occur with  improper specimen collection/handling, submission of specimen other than nasopharyngeal swab, presence of viral mutation(s) within the areas targeted by this assay, and inadequate number of viral copies(<138 copies/mL). A negative result must be combined with clinical observations, patient history, and epidemiological information. The expected result is Negative.  Fact Sheet for Patients:  EntrepreneurPulse.com.au  Fact Sheet for Healthcare Providers:  IncredibleEmployment.be  This test is no t yet approved or cleared by the [place] FDA and  has been authorized for detection and/or diagnosis of SARS-CoV-2 by FDA under an Emergency Use Authorization (EUA). This EUA will remain  in effect (meaning this test can be used) for the duration  of the COVID-19 declaration under Section 564(b)(1) of the Act, 21 U.S.C.section 360bbb-3(b)(1), unless the authorization is terminated  or revoked sooner.  Influenza A by PCR NEGATIVE NEGATIVE Final   Influenza B by PCR NEGATIVE NEGATIVE Final    Comment: (NOTE) The Xpert Xpress SARS-CoV-2/FLU/RSV plus assay is intended as an aid in the diagnosis of influenza from Nasopharyngeal swab specimens and should not be used as a sole basis for treatment. Nasal washings and aspirates are unacceptable for Xpert Xpress SARS-CoV-2/FLU/RSV testing.  Fact Sheet for Patients: EntrepreneurPulse.com.au  Fact Sheet for Healthcare Providers: IncredibleEmployment.be  This test is not yet approved or cleared by the [place] FDA and has been authorized for detection and/or diagnosis of SARS-CoV-2 by FDA under an Emergency Use Authorization (EUA). This EUA will remain in effect (meaning this test can be used) for the duration of the COVID-19 declaration under Section 564(b)(1) of the Act, 21 U.S.C. section 360bbb-3(b)(1), unless the authorization is terminated or revoked.  Performed at [facility], [place] [address] [place], [place] [address]   Aerobic/Anaerobic Culture w Gram Stain (surgical/deep wound)     Status: None (Preliminary result)   Collection Time: 01/05/22 11:48 PM   Specimen: PATH Digit amputation; Tissue  Result Value Ref Range Status   Specimen Description TISSUE RIGHT FINGER  Final   Special Requests RIGHT INDEX FINGER  Final   Gram Stain   Final    NO SQUAMOUS EPITHELIAL CELLS SEEN FEW WBC SEEN NO ORGANISMS SEEN    Culture   Final    CULTURE REINCUBATED FOR BETTER GROWTH Performed at [facility], [place] [address], [place],  [address]    Report Status PENDING  Incomplete     Labs: CBC: Recent Labs  Lab 01/05/22 1530 01/06/22 0320 01/07/22 0356  WBC 12.8* 12.4* 10.4  HGB 13.1 11.7* 11.0*  HCT 40.4 37.7  33.2*  MCV 88.4 91.3 89.7  PLT 248 226 720    Basic Metabolic Panel: Recent Labs  Lab 01/05/22 1530 01/06/22 0320 01/07/22 0356  NA 134* 137 134*  K 4.3 4.5 3.9  CL 101 108 106  CO2 25 20* 21*  GLUCOSE 400* 199* 284*  BUN 15 17 19   CREATININE 1.10* 1.19* 1.17*  CALCIUM 8.5* 8.1* 8.2*    Liver Function Tests: Recent Labs  Lab 01/05/22 1530  AST 12*  ALT 7  ALKPHOS 84  BILITOT 0.9  PROT 6.6  ALBUMIN 3.1*    CBG: Recent Labs  Lab 01/06/22 1618 01/06/22 2207 01/07/22 0746 01/07/22 1158 01/07/22 1638  GLUCAP 237* 319* 216* 162* 244*    Hgb A1c Recent Labs    01/06/22 0320  HGBA1C 10.3*     Time coordinating discharge: 25 minutes  SIGNED:  Vernell Leep, MD,  FACP, [facility], [facility], [facility] (Care Management Physician Certified). [facility] & Physician Advisor  To contact the attending provider between 7A-7P or the covering provider during after hours 7P-7A, please log into the web site [email or web address] and access using universal [place] password for that web site. If you do not have the password, please call the hospital operator.

## 2022-01-07 NOTE — Progress Notes (Signed)
Mobility Specialist Progress Note   01/07/22 1700  Mobility  Activity Ambulated with assistance in hallway  Level of Assistance Contact guard assist, steadying assist  Assistive Device Front wheel walker  Distance Ambulated (ft) 570 ft  Activity Response Tolerated well  $Mobility charge 1 Mobility   Pt reporting no symptoms in R hand and agreeable to mobility. Independently don's and doff's LE prosthesis well. Also ambulated w/ RW for safety and not necessity, normally uses cane. Returned back to EOB w/ call bell in reach and all needs met.  Holland Falling Mobility Specialist Phone Number [phone]

## 2022-01-07 NOTE — Progress Notes (Addendum)
Subjective: 2 Days Post-Op s/p Procedure(s): IRRIGATION AND DEBRIDEMENT RIGHT INDEX FINGER AMPUTATION TIP OF RIGHT INDEX FINGER   Patient is alert, oriented. Report throbbing at right index finger, but pain has generally been well controlled with tylenol only. Denies chest pain, SOB, Calf pain. No nausea/vomiting. No other complaints.   Objective:  PE: VITALS:   Vitals:   01/06/22 0748 01/06/22 1058 01/06/22 1928 01/07/22 0448  BP: (!) 141/69 (!) 130/59 (!) 104/58 (!) 125/58  Pulse: 87 79 80 86  Resp: 16  20 18   Temp: 98.6 F (37 C)  99.9 F (37.7 C) 98.9 F (37.2 C)  TempSrc: Oral  Oral Oral  SpO2: 95%  96% 96%  Weight:      Height:       General: sitting up in bed, in no acute distress MSK: Dressing removed. Right index finger warm and well perfused. Sensation intact. Surgical incision without drainage. No increase in soft tissue swelling. No ecchymosis, no erythema.   LABS  Results for orders placed or performed during the hospital encounter of 01/05/22 (from the past 24 hour(s))  Glucose, capillary     Status: Abnormal   Collection Time: 01/06/22  8:02 AM  Result Value Ref Range   Glucose-Capillary 272 (H) 70 - 99 mg/dL  Glucose, capillary     Status: Abnormal   Collection Time: 01/06/22 12:18 PM  Result Value Ref Range   Glucose-Capillary 198 (H) 70 - 99 mg/dL  Glucose, capillary     Status: Abnormal   Collection Time: 01/06/22  4:18 PM  Result Value Ref Range   Glucose-Capillary 237 (H) 70 - 99 mg/dL  Glucose, capillary     Status: Abnormal   Collection Time: 01/06/22 10:07 PM  Result Value Ref Range   Glucose-Capillary 319 (H) 70 - 99 mg/dL  CBC     Status: Abnormal   Collection Time: 01/07/22  3:56 AM  Result Value Ref Range   WBC 10.4 4.0 - 10.5 K/uL   RBC 3.70 (L) 3.87 - 5.11 MIL/uL   Hemoglobin 11.0 (L) 12.0 - 15.0 g/dL   HCT 33.2 (L) 36.0 - 46.0 %   MCV 89.7 80.0 - 100.0 fL   MCH 29.7 26.0 - 34.0 pg   MCHC 33.1 30.0 - 36.0 g/dL   RDW 12.8  11.5 - 15.5 %   Platelets 207 150 - 400 K/uL   nRBC 0.0 0.0 - 0.2 %  Basic metabolic panel     Status: Abnormal   Collection Time: 01/07/22  3:56 AM  Result Value Ref Range   Sodium 134 (L) 135 - 145 mmol/L   Potassium 3.9 3.5 - 5.1 mmol/L   Chloride 106 98 - 111 mmol/L   CO2 21 (L) 22 - 32 mmol/L   Glucose, Bld 284 (H) 70 - 99 mg/dL   BUN 19 8 - 23 mg/dL   Creatinine, Ser 1.17 (H) 0.44 - 1.00 mg/dL   Calcium 8.2 (L) 8.9 - 10.3 mg/dL   GFR, Estimated 50 (L) >60 mL/min   Anion gap 7 5 - 15    DG Finger Index Right  Addendum Date: 01/05/2022   ADDENDUM REPORT: 01/05/2022 16:03 ADDENDUM: These results were called by telephone at the time of interpretation on 01/05/2022 at at 3:58 p.m. to provider Dr. Raul Del, who verbally acknowledged these results. Electronically Signed   By: Fidela Salisbury M.D.   On: 01/05/2022 16:03   Result Date: 01/05/2022 CLINICAL DATA:  Post amputation of the right  middle finger because of abscess. EXAM: RIGHT INDEX FINGER 2+V COMPARISON:  October 03, 2016 FINDINGS: Post amputation of the distal third phalanx chest distal to the D IP joint. March soft tissue swelling and marked subcutaneous emphysema centered at the amputation site and extending along the third digit. IMPRESSION: 1. Post amputation of the distal third phalanx distal to the D IP joint. 2. Marked soft tissue swelling and marked subcutaneous emphysema centered at the amputation site and extending along the third digit. Findings are concerning for gaseous gangrene of the finger. Electronically Signed: By: Fidela Salisbury M.D. On: 01/05/2022 15:55    Assessment/Plan: Right index finger osteomyelitis with gas gangrene in patient with poorly controlled diabetes   2 Day Post-Op s/p Procedure(s): IRRIGATION AND DEBRIDEMENT RIGHT INDEX FINGER AMPUTATION TIP OF RIGHT INDEX FINGER - patient afebrile - WBC trending down to 10.4 this morning - no growth yet seen on surgical cultures, blood cultures  negative - ID: vancomycin 1,000 mg q 24 hours, zosyn 3.375 g q 8 hours   Weightbearing: NWB RUE Insicional and dressing care: Reinforce dressings as needed, new dressing changed this morning.  Pain control: continue current regimen Follow - up plan: with Dr. Mardelle Matte 1 week after discharge Dispo: ok to discharge home from ortho standpoint when cleared by medicine  Contact information:   Weekdays 8-5 Merlene Pulling, PA-C [phone] A fter hours and holidays please check [email or web address] for group call information for [facility]  Ventura Bruns 01/07/2022, 7:23 AM

## 2022-01-07 NOTE — Progress Notes (Signed)
PHARMACY - PHYSICIAN COMMUNICATION CRITICAL VALUE ALERT - BLOOD CULTURE IDENTIFICATION (BCID)  1/15 Blood: GPC 1/4-no organism detected on rapid culture  Diana Coleman is an 71 y.o. female who presented to [facility] on 01/05/2022 with a chief complaint of osteomyelitis    Name of physician (or Provider) Contacted: Clarene Essex ([facility])  Current antibiotics: Vancomycin/Zosyn  Changes to prescribed antibiotics recommended:  No changes in setting of osteomyelitis   Results for orders placed or performed during the hospital encounter of 01/05/22  Blood Culture ID Panel (Reflexed) (Collected: 01/05/2022  5:40 PM)  Result Value Ref Range   Enterococcus faecalis NOT DETECTED NOT DETECTED   Enterococcus Faecium NOT DETECTED NOT DETECTED   Listeria monocytogenes NOT DETECTED NOT DETECTED   Staphylococcus species NOT DETECTED NOT DETECTED   Staphylococcus aureus (BCID) NOT DETECTED NOT DETECTED   Staphylococcus epidermidis NOT DETECTED NOT DETECTED   Staphylococcus lugdunensis NOT DETECTED NOT DETECTED   Streptococcus species NOT DETECTED NOT DETECTED   Streptococcus agalactiae NOT DETECTED NOT DETECTED   Streptococcus pneumoniae NOT DETECTED NOT DETECTED   Streptococcus pyogenes NOT DETECTED NOT DETECTED   A.calcoaceticus-baumannii NOT DETECTED NOT DETECTED   Bacteroides fragilis NOT DETECTED NOT DETECTED   Enterobacterales NOT DETECTED NOT DETECTED   Enterobacter cloacae complex NOT DETECTED NOT DETECTED   Escherichia coli NOT DETECTED NOT DETECTED   Klebsiella aerogenes NOT DETECTED NOT DETECTED   Klebsiella oxytoca NOT DETECTED NOT DETECTED   Klebsiella pneumoniae NOT DETECTED NOT DETECTED   Proteus species NOT DETECTED NOT DETECTED   Salmonella species NOT DETECTED NOT DETECTED   Serratia marcescens NOT DETECTED NOT DETECTED   Haemophilus influenzae NOT DETECTED NOT DETECTED   Neisseria meningitidis NOT DETECTED NOT DETECTED   Pseudomonas aeruginosa NOT DETECTED NOT DETECTED    Stenotrophomonas maltophilia NOT DETECTED NOT DETECTED   Candida albicans NOT DETECTED NOT DETECTED   Candida auris NOT DETECTED NOT DETECTED   Candida glabrata NOT DETECTED NOT DETECTED   Candida krusei NOT DETECTED NOT DETECTED   Candida parapsilosis NOT DETECTED NOT DETECTED   Candida tropicalis NOT DETECTED NOT DETECTED   Cryptococcus neoformans/gattii NOT DETECTED NOT DETECTED    Narda Bonds 01/07/2022  1:23 AM

## 2022-01-08 ENCOUNTER — Encounter (HOSPITAL_COMMUNITY): Payer: Self-pay | Admitting: Orthopedic Surgery

## 2022-01-08 LAB — GLUCOSE, CAPILLARY: Glucose-Capillary: 145 mg/dL — ABNORMAL HIGH (ref 70–99)

## 2022-01-08 MED ORDER — DOXYCYCLINE HYCLATE 50 MG PO CAPS
100.0000 mg | ORAL_CAPSULE | Freq: Two times a day (BID) | ORAL | 0 refills | Status: AC
Start: 2022-01-08 — End: 2022-01-18

## 2022-01-08 NOTE — Progress Notes (Signed)
AVS d/c teaching provided to pt at bedside. Pt has verbalized understanding of teaching at this time.

## 2022-01-08 NOTE — Plan of Care (Signed)

## 2022-01-08 NOTE — Progress Notes (Signed)
Patient seen this morning, no issues overnight.  Feels back to baseline.  Appears stable to go home, please refer to DC summary by Dr. Algis Liming from 01/07/2023  Annett Boxwell M. Cruzita Lederer, MD, PhD [facility]  Between 7 am - 7 pm you can contact me via Amion (for emergencies) or [place] (non urgent matters).  I am not available 7 pm - 7 am, please contact night coverage MD/APP via Amion

## 2022-01-08 NOTE — Care Management Important Message (Signed)
Important Message  Patient Details  Name: Diana Coleman MRN: [id number] Date of Birth: 01/02/1951   Medicare Important Message Given:  Yes     Hannah Beat 01/08/2022, 11:55 AM

## 2022-01-08 NOTE — Progress Notes (Signed)
Mobility Specialist Progress Note   01/08/22 1043  Mobility  Activity Off unit (Pt upon entry being d/c'd home)   Holland Falling Mobility Specialist Phone Number [phone]

## 2022-01-08 NOTE — Progress Notes (Signed)
° ° ° °  Subjective: 3 Days Post-Op s/p Procedure(s): IRRIGATION AND DEBRIDEMENT RIGHT INDEX FINGER AMPUTATION TIP OF RIGHT INDEX FINGER   Patient is alert, oriented. Pain well controlled. Denies chest pain, SOB, Calf pain. No nausea/vomiting. No other complaints.   Objective:  PE: VITALS:   Vitals:   01/06/22 1928 01/07/22 0448 01/07/22 0910 01/07/22 2128  BP: (!) 104/58 (!) 125/58 115/66 130/68  Pulse: 80 86 86 71  Resp: 20 18 18 18   Temp: 99.9 F (37.7 C) 98.9 F (37.2 C) 98.2 F (36.8 C)   TempSrc: Oral Oral Oral Oral  SpO2: 96% 96% 97% 96%  Weight:      Height:       General: sitting up in bed, in no acute distress MSK: Dressing removed. Right index finger warm and well perfused. Surgical incision without drainage. No increase in soft tissue swelling. No ecchymosis, no erythema.   LABS  Results for orders placed or performed during the hospital encounter of 01/05/22 (from the past 24 hour(s))  Glucose, capillary     Status: Abnormal   Collection Time: 01/07/22 11:58 AM  Result Value Ref Range   Glucose-Capillary 162 (H) 70 - 99 mg/dL  Glucose, capillary     Status: Abnormal   Collection Time: 01/07/22  4:38 PM  Result Value Ref Range   Glucose-Capillary 244 (H) 70 - 99 mg/dL  Glucose, capillary     Status: Abnormal   Collection Time: 01/07/22  9:30 PM  Result Value Ref Range   Glucose-Capillary 267 (H) 70 - 99 mg/dL  Glucose, capillary     Status: Abnormal   Collection Time: 01/08/22  6:03 AM  Result Value Ref Range   Glucose-Capillary 145 (H) 70 - 99 mg/dL    No results found.  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  Pain control: continue current regimen Follow - up plan: with Dr. Mardelle Matte 1 week after discharge Dispo: ok to discharge home from ortho standpoint when cleared by medicine  Contact information:   Weekdays 8-5 Merlene Pulling, PA-C [phone] A fter hours and holidays please check [email or web address] for group call information for [facility]  Ventura Bruns 01/08/2022, 8:53 AM

## 2022-01-09 ENCOUNTER — Ambulatory Visit: Payer: Medicare Other

## 2022-01-09 LAB — CULTURE, BLOOD (ROUTINE X 2)

## 2022-01-10 ENCOUNTER — Other Ambulatory Visit: Payer: Self-pay | Admitting: Family Medicine

## 2022-01-10 ENCOUNTER — Ambulatory Visit
Admission: RE | Admit: 2022-01-10 | Discharge: 2022-01-10 | Disposition: A | Discharge status: Home / Self Care | Payer: Medicaid Other | Source: Ambulatory Visit | Attending: Family Medicine | Admitting: Family Medicine

## 2022-01-10 DIAGNOSIS — R0789 Other chest pain: Secondary | ICD-10-CM

## 2022-01-10 LAB — CULTURE, BLOOD (ROUTINE X 2): Culture: NO GROWTH

## 2022-01-12 LAB — AEROBIC/ANAEROBIC CULTURE W GRAM STAIN (SURGICAL/DEEP WOUND): Gram Stain: NONE SEEN

## 2022-03-08 IMAGING — CR DG CHEST 2V
2 series · 2 of 2 positions shown · non-contrast
Comparison: Chest two views 07/27/2018

CLINICAL DATA: Left chest wall pain. Woke up with acute pain in
anterior left ribs just under left breast. Difficult to breathe.

EXAM:
CHEST - 2 VIEW; LEFT RIBS - 2 VIEW

[w chest pa]
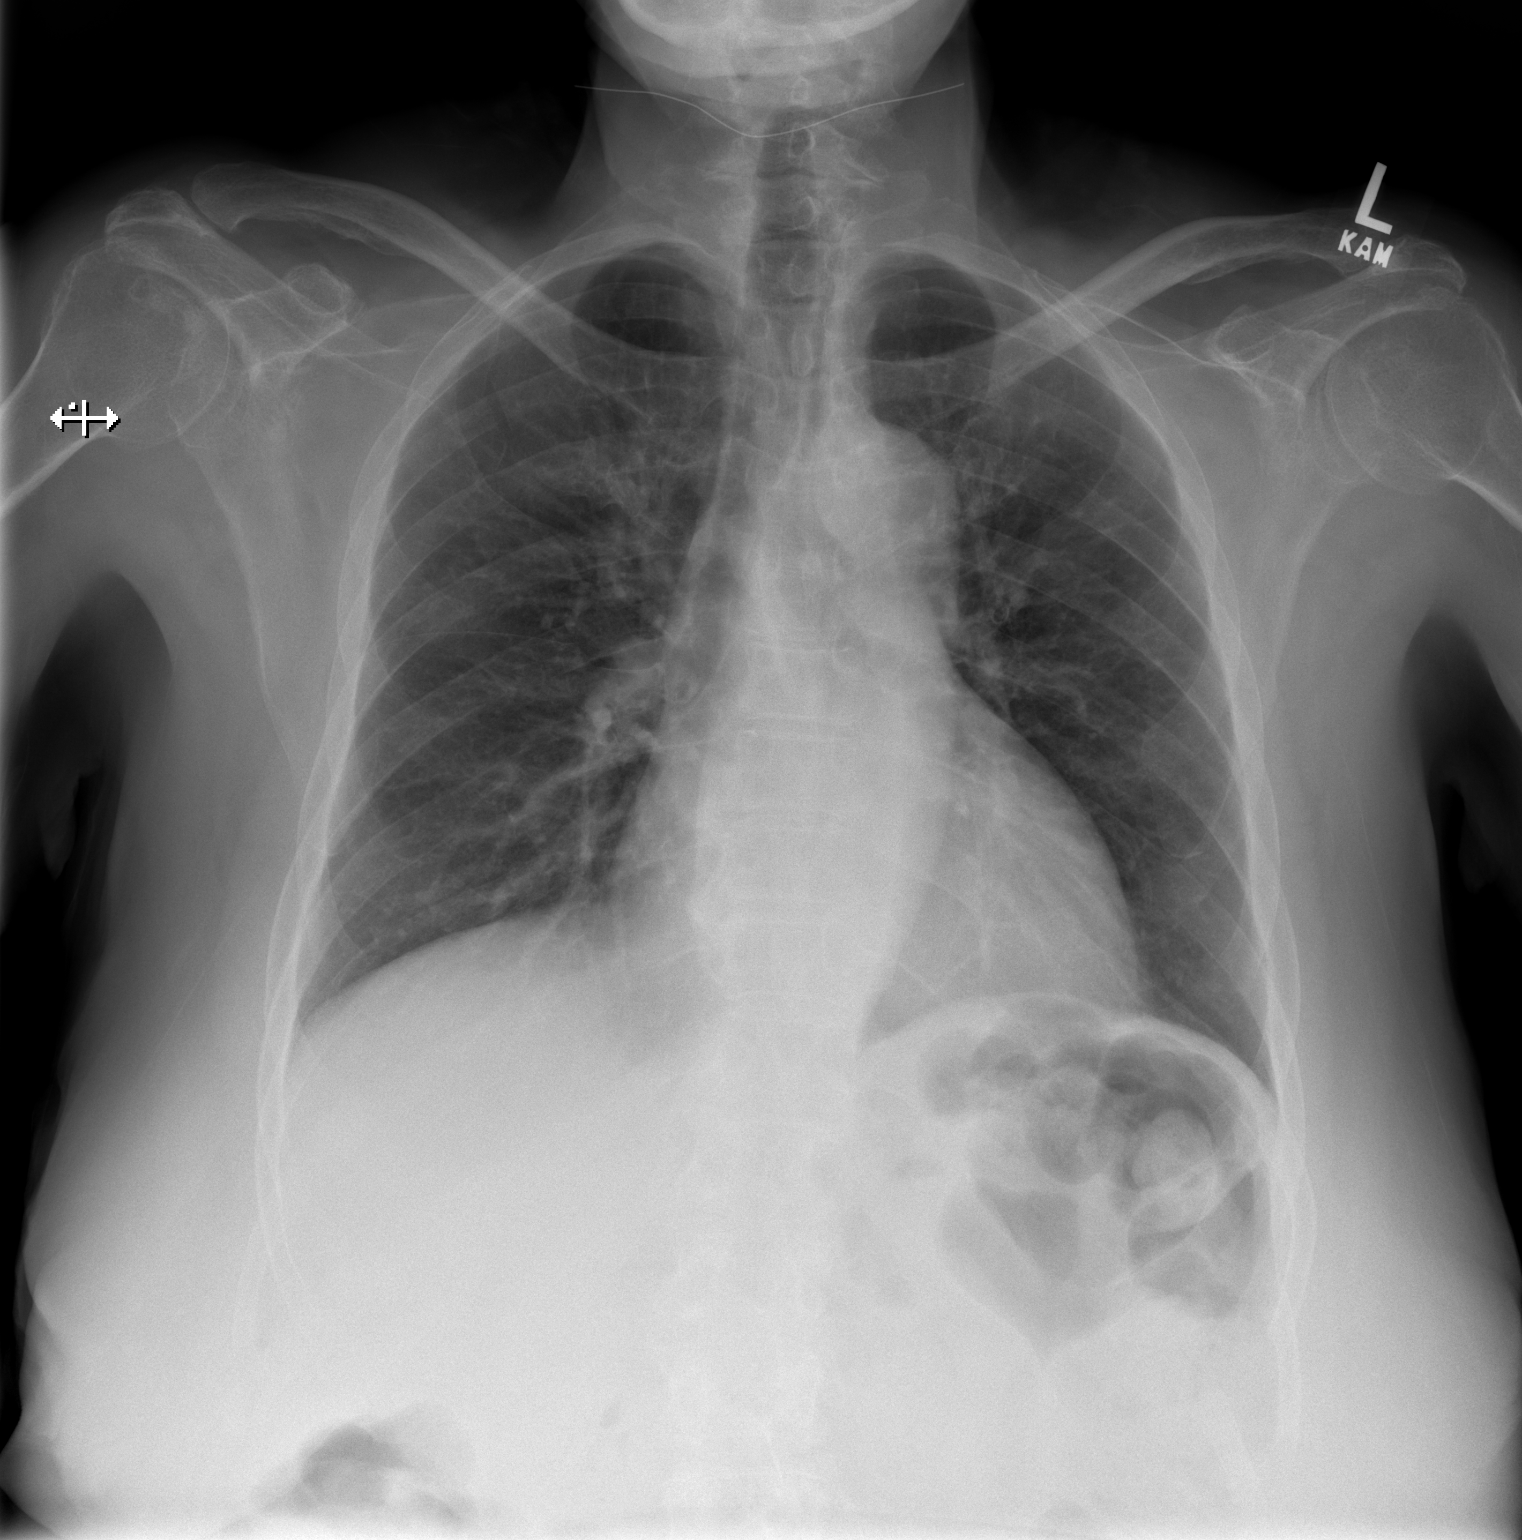

[w chest lat]
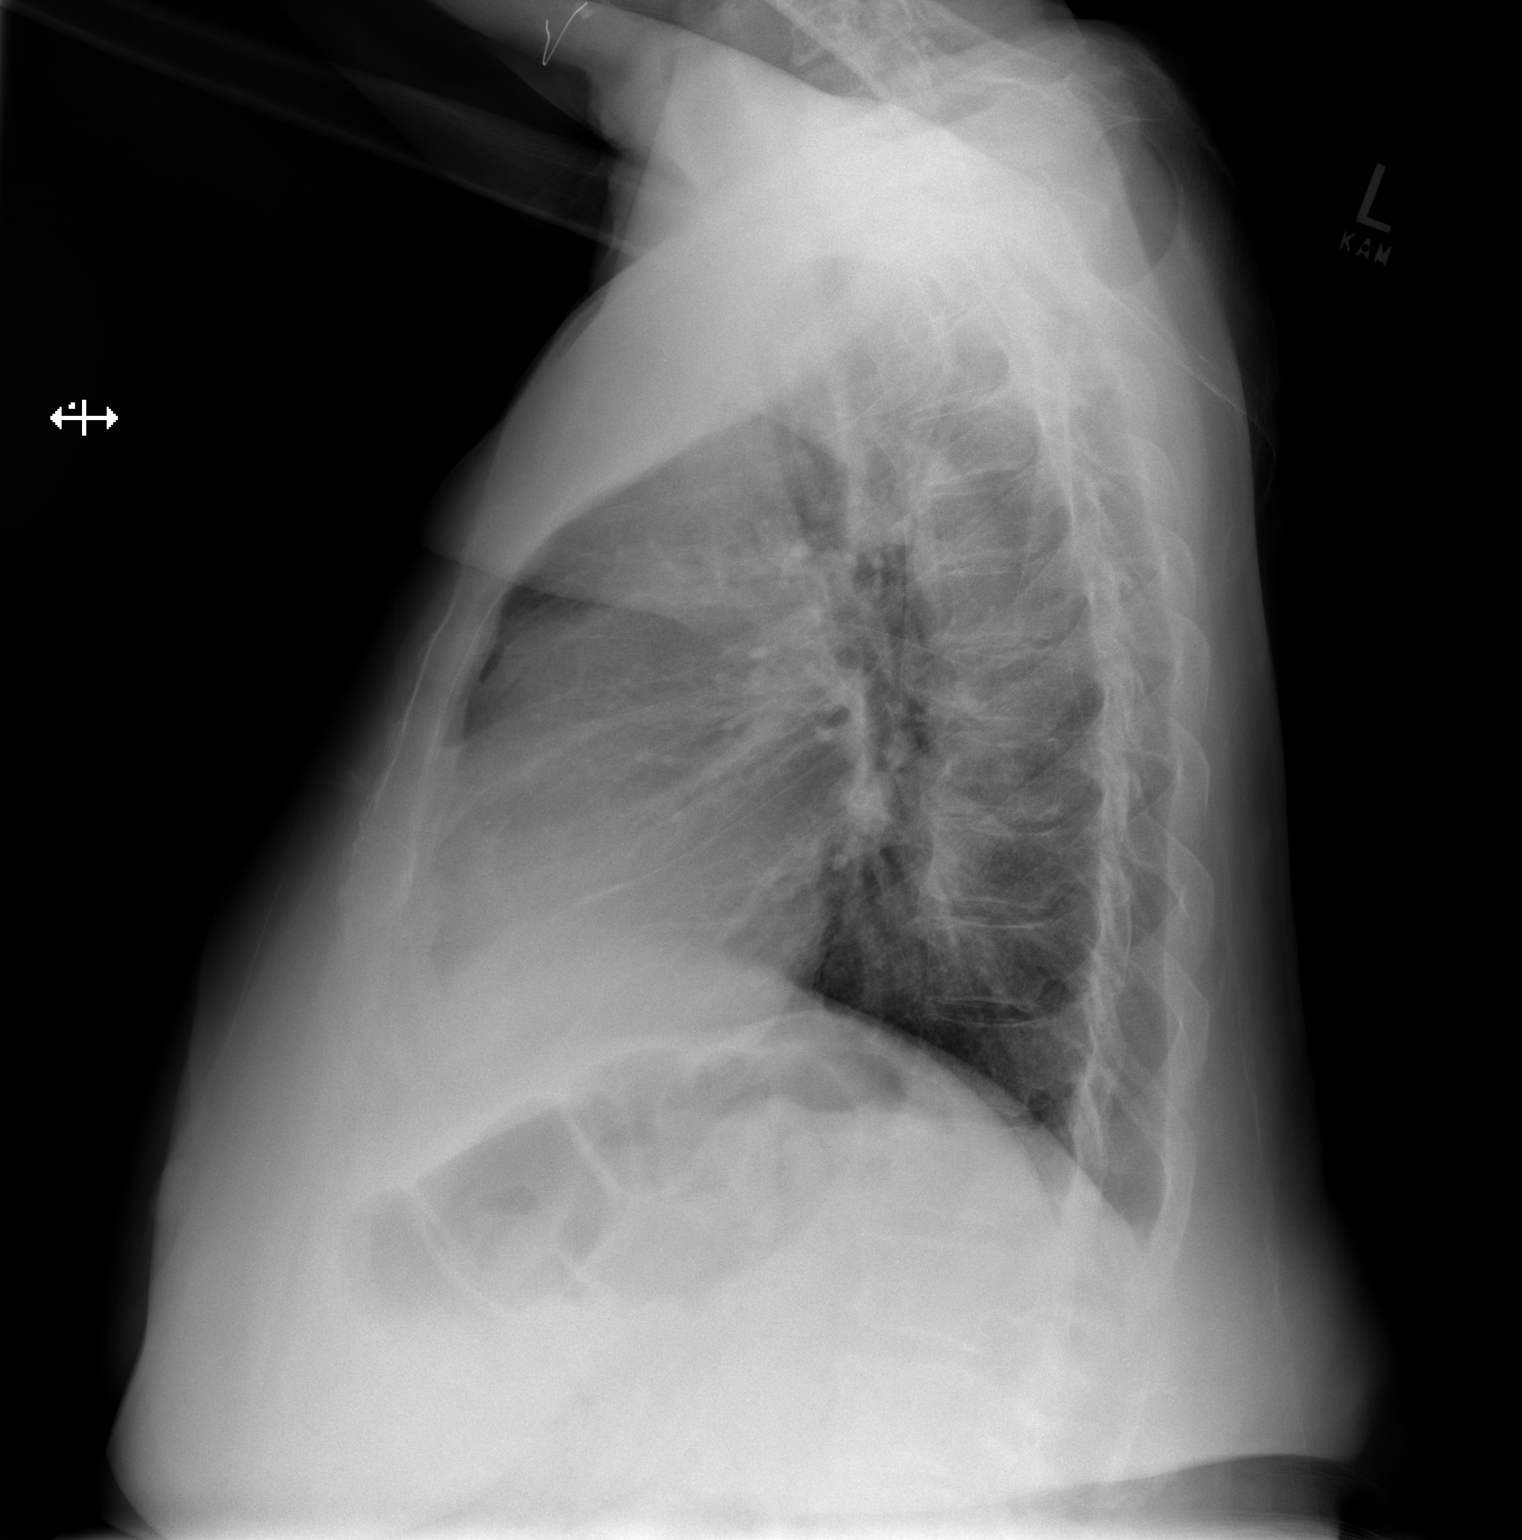

[2 of 2 positions shown; findings below may reference images not displayed]

FINDINGS: Chest:

Cardiac silhouette is at the upper limits of normal size, unchanged.
Mild calcification within aortic arch. Normal mediastinal contours.
The lungs are clear. No pleural effusion or pneumothorax. Mild
multilevel degenerative disc changes of the thoracic spine.

Left ribs:

A BB marker is seen overlying the distal lateral left ribs. No acute
displaced left-sided rib fracture is seen.
IMPRESSION: No active cardiopulmonary disease.

No acute displaced left-sided rib fracture is seen.

## 2022-06-12 ENCOUNTER — Other Ambulatory Visit: Payer: Medicare Other

## 2022-06-19 ENCOUNTER — Ambulatory Visit
Admission: RE | Admit: 2022-06-19 | Discharge: 2022-06-19 | Disposition: A | Discharge status: Home / Self Care | Payer: Medicare Other | Source: Ambulatory Visit | Attending: Family Medicine | Admitting: Family Medicine

## 2022-06-19 DIAGNOSIS — E2839 Other primary ovarian failure: Secondary | ICD-10-CM

## 2022-08-20 ENCOUNTER — Encounter (INDEPENDENT_AMBULATORY_CARE_PROVIDER_SITE_OTHER): Payer: Medicare Other | Admitting: Ophthalmology

## 2022-08-20 DIAGNOSIS — H35033 Hypertensive retinopathy, bilateral: Secondary | ICD-10-CM

## 2022-08-20 DIAGNOSIS — E113593 Type 2 diabetes mellitus with proliferative diabetic retinopathy without macular edema, bilateral: Secondary | ICD-10-CM

## 2022-08-20 DIAGNOSIS — I10 Essential (primary) hypertension: Secondary | ICD-10-CM | POA: Diagnosis not present

## 2022-08-20 DIAGNOSIS — H43812 Vitreous degeneration, left eye: Secondary | ICD-10-CM

## 2022-10-10 ENCOUNTER — Other Ambulatory Visit: Payer: Self-pay

## 2022-10-10 ENCOUNTER — Encounter (HOSPITAL_BASED_OUTPATIENT_CLINIC_OR_DEPARTMENT_OTHER): Payer: Self-pay | Admitting: Emergency Medicine

## 2022-10-10 ENCOUNTER — Emergency Department (HOSPITAL_BASED_OUTPATIENT_CLINIC_OR_DEPARTMENT_OTHER)
Admission: EM | Admit: 2022-10-10 | Discharge: 2022-10-10 | Disposition: A | Discharge status: Home / Self Care | Payer: Medicare Other | Attending: Emergency Medicine | Admitting: Emergency Medicine

## 2022-10-10 DIAGNOSIS — I10 Essential (primary) hypertension: Secondary | ICD-10-CM | POA: Diagnosis not present

## 2022-10-10 DIAGNOSIS — M542 Cervicalgia: Secondary | ICD-10-CM | POA: Diagnosis present

## 2022-10-10 DIAGNOSIS — Z794 Long term (current) use of insulin: Secondary | ICD-10-CM | POA: Insufficient documentation

## 2022-10-10 DIAGNOSIS — Z79899 Other long term (current) drug therapy: Secondary | ICD-10-CM | POA: Insufficient documentation

## 2022-10-10 DIAGNOSIS — M2569 Stiffness of other specified joint, not elsewhere classified: Secondary | ICD-10-CM | POA: Insufficient documentation

## 2022-10-10 DIAGNOSIS — E119 Type 2 diabetes mellitus without complications: Secondary | ICD-10-CM | POA: Diagnosis not present

## 2022-10-10 DIAGNOSIS — Z7984 Long term (current) use of oral hypoglycemic drugs: Secondary | ICD-10-CM | POA: Insufficient documentation

## 2022-10-10 MED ORDER — TIZANIDINE HCL 2 MG PO CAPS: 2.0000 mg | ORAL_CAPSULE | Freq: Three times a day (TID) | ORAL | 0 refills | Status: DC

## 2022-10-10 MED ORDER — IBUPROFEN 400 MG PO TABS
400.0000 mg | ORAL_TABLET | Freq: Four times a day (QID) | ORAL | 0 refills | Status: DC | PRN
Start: 2022-10-10 — End: 2022-10-10

## 2022-10-10 MED ORDER — ACETAMINOPHEN 325 MG PO TABS
650.0000 mg | ORAL_TABLET | Freq: Once | ORAL | Status: AC
  Administered 2022-10-10: 650 mg via ORAL
  Filled 2022-10-10: qty 2

## 2022-10-10 MED ORDER — IBUPROFEN 400 MG PO TABS: 400.0000 mg | ORAL_TABLET | Freq: Four times a day (QID) | ORAL | 0 refills | Status: DC | PRN

## 2022-10-10 MED ORDER — CYCLOBENZAPRINE HCL 10 MG PO TABS
10.0000 mg | ORAL_TABLET | Freq: Two times a day (BID) | ORAL | 0 refills | Status: DC | PRN
Start: 2022-10-10 — End: 2022-10-10

## 2022-10-10 NOTE — ED Triage Notes (Signed)
Pt arrives to ED with c/o neck pain and stiffness over the past two weeks.

## 2022-10-10 NOTE — Discharge Instructions (Addendum)
Please take tylenol/ibuprofen for pain, Flexeril for muscle relaxant. I recommend close follow-up with your PCP for reevaluation.  Please do not hesitate to return to emergency department if worrisome signs symptoms we discussed become apparent.

## 2022-10-10 NOTE — ED Provider Notes (Signed)
[facility] EMERGENCY DEPT Provider Note   CSN: [id number] Arrival date & time: 10/10/22  1146     History {Add pertinent medical, surgical, social history, OB history to HPI:1} Chief Complaint  Patient presents with   Torticollis    Diana Coleman is a 71 y.o. female with a past medical history of type 2 diabetes, hypertension, hepatitis presenting to the emergency department for evaluation of neck pain and stiffness patient.  Patient states she has had left-sided neck pain and stiffness in the last 8 days.  The pain got worse when she turns her head to the left.  Endorses difficulty with swallowing.  Denies difficulty with speaking.  She has not taken any medication for pain.  Denies fever, nausea, vomiting, chest pain, shortness of breath, constipation, diarrhea, urinary symptoms, bowel changes.  Denies facial or neck swelling.  HPI     Home Medications Prior to Admission medications   Medication Sig Start Date End Date Taking? Authorizing Provider  acetaminophen (TYLENOL) 325 MG tablet Take 2 tablets (650 mg total) by mouth every 6 (six) hours as needed for mild pain, moderate pain or fever (or Fever >/= 101). 01/07/22   Hongalgi, Lenis Dickinson, MD  amLODipine (NORVASC) 10 MG tablet Take 10 mg by mouth daily.    [provider]  atorvastatin (LIPITOR) 10 MG tablet Take 10 mg by mouth daily.  10/21/18   [provider]  Dulaglutide 1.5 MG/0.5ML SOPN Inject 4.5 mg into the skin every Thursday.    [provider]  insulin degludec (TRESIBA) 100 UNIT/ML FlexTouch Pen Inject 35 Units into the skin daily.    [provider]  lisinopril (PRINIVIL,ZESTRIL) 10 MG tablet Take 1 tablet (10 mg total) by mouth daily. 11/02/14   Kelvin Cellar, MD  metFORMIN (GLUCOPHAGE) 1000 MG tablet Take 1,000 mg by mouth 2 (two) times daily with a meal.    [provider]      Allergies    Clindamycin/lincomycin and Codeine    Review of Systems    Review of Systems  HENT:  Positive for trouble swallowing.     Physical Exam Updated Vital Signs BP 115/73 (BP Location: Right Arm)   Pulse 91   Temp 98.2 F (36.8 C) (Temporal)   Resp 18   Wt 86.6 kg   SpO2 97%   BMI 29.91 kg/m  Physical Exam Vitals and nursing note reviewed.  Constitutional:      Appearance: Normal appearance.  HENT:     Head: Normocephalic.     Mouth/Throat:     Pharynx: No oropharyngeal exudate or posterior oropharyngeal erythema.  Eyes:     General: No scleral icterus. Neck:     Comments: Tenderness to palpation to left-sided neck.  Patient has difficulty with turning her head to the left.  Range of motion of the neck otherwise normal. Pulmonary:     Effort: Pulmonary effort is normal.  Abdominal:     General: Abdomen is flat.  Musculoskeletal:        General: No deformity.  Neurological:     Mental Status: She is alert.  Psychiatric:        Mood and Affect: Mood normal.     ED Results / Procedures / Treatments   Labs (all labs ordered are listed, but only abnormal results are displayed) Labs Reviewed - No data to display  EKG None  Radiology No results found.  Procedures Procedures  {Document cardiac monitor, telemetry assessment procedure when appropriate:1}  Medications  Ordered in ED Medications - No data to display  ED Course/ Medical Decision Making/ A&P                           Medical Decision Making  This patient presents to the ED for concern of ***, this involves an extensive number of treatment options, and is a complaint that carries with it a high risk of complications and morbidity.  The differential diagnosis includes *** Co morbidities that complicate the patient evaluation  See HPI Additional history obtained:  Additional history obtained from EMR External records from outside source obtained and reviewed including Care Everywhere/External Records and Primary Care Documents Lab Tests:  I Ordered, and  personally interpreted labs.  The pertinent results include:   No leukocytosis noted.  No evidence of anemia.  Platelets within normal range.   No electrolyte abnormalities noted.  Renal function within normal limits.  No transaminitis noted.  Lipase within normal limits.   UA significant for no acute abnormalities. *** Imaging Studies ordered:  I ordered imaging studies including: ***  I independently visualized and interpreted imaging. I agree with the radiologist interpretation Cardiac Monitoring: / EKG:  The patient was maintained on a cardiac monitor.  I personally viewed and interpreted the cardiac monitored which showed an underlying rhythm of: sinus rhythm Consultations Obtained:  I requested consultation with the ***,  and discussed lab and imaging findings as well as pertinent plan - they recommend: *** Problem List / ED Course / Critical interventions / Medication management  *** Vitals signs within normal range and stable throughout visit Laboratory/imaging studies significant for: See above On physical examination, patient is afebrile and appears in no acute distress. Patient's presentations are most concerned for ***. Low suspicion for ***. I ordered medication including ***  Reevaluation of the patient after these medicines showed that the patient {resolved/improved/worsened:23923::"improved"} I have reviewed the patients home medicines and have made adjustments as needed Continued outpatient therapy. Follow-up with *** recommended for reevaluation of symptoms. Treatment plan discussed with patient.  Pt acknowledged understanding was agreeable to the plan. Social Determinants of Health:  N/A Test / Admission / Dispo - Considered:  Worrisome signs and symptoms were discussed with patient, and patient acknowledged understanding to return to the ED if they noticed these signs and symptoms. Patient was stable upon discharge.    {Document critical care time when  appropriate:1} {Document review of labs and clinical decision tools ie heart score, Chads2Vasc2 etc:1}  {Document your independent review of radiology images, and any outside records:1} {Document your discussion with family members, caretakers, and with consultants:1} {Document social determinants of health affecting pt's care:1} {Document your decision making why or why not admission, treatments were needed:1} Final Clinical Impression(s) / ED Diagnoses Final diagnoses:  None    Rx / DC Orders ED Discharge Orders     None

## 2022-10-30 ENCOUNTER — Telehealth: Payer: Self-pay | Admitting: Cardiology

## 2022-10-30 NOTE — Telephone Encounter (Signed)
Called patient to schedule recall. She states she does not feel she needs to be seen and has been doing okay.

## 2022-12-02 ENCOUNTER — Ambulatory Visit (INDEPENDENT_AMBULATORY_CARE_PROVIDER_SITE_OTHER): Payer: Medicare Other | Admitting: Family

## 2022-12-02 DIAGNOSIS — Z89511 Acquired absence of right leg below knee: Secondary | ICD-10-CM

## 2022-12-02 DIAGNOSIS — Z89512 Acquired absence of left leg below knee: Secondary | ICD-10-CM

## 2022-12-03 ENCOUNTER — Encounter: Payer: Self-pay | Admitting: Family

## 2022-12-03 NOTE — Progress Notes (Signed)
Office Visit Note   Patient: Diana Coleman           Date of Birth: January 21, 1951           MRN: [id number] Visit Date: 12/02/2022              Requested by: Roselee Nova, MD [place],  [place] [address] PCP: Roselee Nova, MD  Chief Complaint  Patient presents with   Left Leg - Follow-up    Hx bilateral BKA needs new rx for prosthetic supplies    Right Leg - Follow-up      HPI: The patient is a 71 year old woman who presents today in follow-up she is status post bilateral below-knee amputations which was remote.  Her current liners are completely broken down there greater than 6 inch holes and bilateral liners.  She states that her other liners are in even worse shape.  She is in need of all of her supplies despite this she continues to ambulate in her prostheses.  Patient is an existing bilateral transtibial  amputee.  Patient's current comorbidities are not expected to impact the ability to function with the prescribed prosthesis. Patient verbally communicates a strong desire to use a prosthesis. Patient currently requires mobility aids to ambulate without a prosthesis.  Expects not to use mobility aids with a new prosthesis.  Patient is a K3 level ambulator that spends a lot of time walking around on uneven terrain over obstacles, up and down stairs, and ambulates with a variable cadence.     Assessment & Plan: Visit Diagnoses: No diagnosis found.  Plan: Given an order for prosthesis supplies.  She will follow-up as needed.  Follow-Up Instructions: Return if symptoms worsen or fail to improve.   Ortho Exam  Patient is alert, oriented, no adenopathy, well-dressed, normal affect, normal respiratory effort. On examination bilateral residual limbs these are well consolidated well-healed there is no callus no impending skin breakdown.  Imaging: No results found. No images are attached to the encounter.  Labs: Lab Results  Component Value Date    HGBA1C 10.3 (H) 01/06/2022   HGBA1C 8.0 (H) 10/04/2016   HGBA1C 8.0 (H) 10/24/2014   ESRSEDRATE 62 (H) 10/04/2016   ESRSEDRATE 92 (H) 10/25/2014   CRP 2.0 (H) 10/04/2016   CRP 28.6 (H) 10/25/2014   REPTSTATUS 01/12/2022 FINAL 01/05/2022   GRAMSTAIN  01/05/2022    NO SQUAMOUS EPITHELIAL CELLS SEEN FEW WBC SEEN NO ORGANISMS SEEN Performed at [facility], [place] [address] [place], [place] [address]    CULT  01/05/2022    FEW HAEMOPHILUS PARAINFLUENZAE RARE AGGREGATIBACTER SEGNIS BETA LACTAMASE POSITIVE RARE PEPTOSTREPTOCOCCUS SPECIES      Lab Results  Component Value Date   ALBUMIN 3.1 (L) 01/05/2022   ALBUMIN 3.1 (L) 02/01/2019   ALBUMIN 3.2 (L) 07/31/2015    No results found for: "MG" No results found for: "VD25OH"  No results found for: "PREALBUMIN"    Latest Ref Rng & Units 01/07/2022    3:56 AM 01/06/2022    3:20 AM 01/05/2022    3:30 PM  CBC EXTENDED  WBC 4.0 - 10.5 K/uL 10.4  12.4  12.8   RBC 3.87 - 5.11 MIL/uL 3.70  4.13  4.57   Hemoglobin 12.0 - 15.0 g/dL 11.0  11.7  13.1   HCT 36.0 - 46.0 % 33.2  37.7  40.4   Platelets 150 - 400 K/uL 207  226  248  There is no height or weight on file to calculate BMI.  Orders:  No orders of the defined types were placed in this encounter.  No orders of the defined types were placed in this encounter.    Procedures: No procedures performed  Clinical Data: No additional findings.  ROS:  All other systems negative, except as noted in the HPI. Review of Systems  Objective: Vital Signs: There were no vitals taken for this visit.  Specialty Comments:  No specialty comments available.  PMFS History: Patient Active Problem List   Diagnosis Date Noted   Type 2 diabetes mellitus with hyperlipidemia ([place]) 01/06/2022   Hyperglycemia    Dyslipidemia 10/18/2020   Vitreous hemorrhage of right eye due to diabetes mellitus ([place]) 02/01/2019   Preop cardiovascular exam 10/22/2018   Chest pain 10/22/2018    Vitreous hemorrhage, right eye ([place]) 09/21/2018   Proliferative diabetic retinopathy with macular edema ([place]) 09/21/2018   Non-pressure chronic ulcer of left calf, limited to breakdown of skin ([place]) 11/12/2016   S/P BKA (below knee amputation) bilateral ([place]) 11/12/2016   Anemia 10/04/2016   Finger osteomyelitis, right ([facility]) 10/04/2016   Finger infection 10/04/2016   Osteomyelitis of finger ([place]) 10/04/2016   Gastroparesis 11/02/2014   Diabetes mellitus due to underlying condition with other specified complication ([place])    Diabetic foot ([facility])    Nausea & vomiting    Hypertension complicating diabetes ([place]) 10/25/2014   GERD (gastroesophageal reflux disease) 10/25/2014   Cellulitis 10/24/2014   Diabetes ([place]) 10/24/2014   Insulin dependent diabetes mellitus ([place]) 10/24/2014   Past Medical History:  Diagnosis Date   Cellulitis of foot, left 10/24/2014   hx/notes 10/24/2014   Charcot foot due to diabetes mellitus ([place])    Hepatitis    Hep B    Hypertension    Osteomyelitis of right foot ([place])    hx/notes 10/24/2014   Proliferative diabetic retinopathy ([place])    right eye and vitreous hemorrhage   Type II diabetes mellitus ([place])    Since 1998    Family History  Problem Relation Age of Onset   Uterine cancer Mother    Gout Father    Thyroid disease Sister     Past Surgical History:  Procedure Laterality Date   ABOVE KNEE LEG AMPUTATION Right 2004   AMPUTATION Left 10/29/2014   Procedure: AMPUTATION BELOW KNEE - LEFT;  Surgeon: Newt Minion, MD;  Location: [facility];  Service: Orthopedics;  Laterality: Left;   AMPUTATION Right 01/05/2022   Procedure: AMPUTATION TIP OF RIGHT INDEX FINGER;  Surgeon: Marchia Bond, MD;  Location: [facility];  Service: Orthopedics;  Laterality: Right;   CATARACT EXTRACTION W/ INTRAOCULAR LENS  IMPLANT, BILATERAL     FOOT SURGERY Left 1980's   "ulcer removed"   I & D EXTREMITY Right 10/04/2016   Procedure: IRRIGATION AND DEBRIDEMENT MIDDLE FINGER AND  AMPUTATION;  Surgeon: Iran Planas, MD;  Location: [facility];  Service: Orthopedics;  Laterality: Right;   I & D EXTREMITY Right 01/05/2022   Procedure: IRRIGATION AND DEBRIDEMENT RIGHT INDEX FINGER;  Surgeon: Marchia Bond, MD;  Location: [facility];  Service: Orthopedics;  Laterality: Right;   MEMBRANE PEEL Right 02/01/2019   Procedure: Carvel Getting;  Surgeon: Hayden Pedro, MD;  Location: [facility];  Service: Ophthalmology;  Laterality: Right;   PARS PLANA VITRECTOMY 27 GAUGE Right 02/01/2019   PARS PLANA VITRECTOMY 27 GAUGE Right 02/01/2019   Procedure: PARS PLANA VITRECTOMY 27 GAUGE, MEMBRANE PEEL, ENDOLASER, GAS INJECTION;  Surgeon:  Hayden Pedro, MD;  Location: [facility];  Service: Ophthalmology;  Laterality: Right;   REDUCTION MAMMAPLASTY Bilateral 1987   TOE AMPUTATION Left ~ 2011   "top of my 3rd toe"   [facility]   Social History   Occupational History   Not on file  Tobacco Use   Smoking status: Former    Packs/day: 0.50    Years: 30.00    Total pack years: 15.00    Types: Cigarettes   Smokeless tobacco: Never   Tobacco comments:    "quit smoking ~ 2000"  Vaping Use   Vaping Use: Never used  Substance and Sexual Activity   Alcohol use: No    Alcohol/week: 0.0 standard drinks of alcohol   Drug use: No   Sexual activity: Never

## 2023-02-19 ENCOUNTER — Encounter (INDEPENDENT_AMBULATORY_CARE_PROVIDER_SITE_OTHER): Payer: 59 | Admitting: Ophthalmology

## 2023-02-24 ENCOUNTER — Encounter (INDEPENDENT_AMBULATORY_CARE_PROVIDER_SITE_OTHER): Payer: 59 | Admitting: Ophthalmology

## 2023-02-24 DIAGNOSIS — H35033 Hypertensive retinopathy, bilateral: Secondary | ICD-10-CM

## 2023-02-24 DIAGNOSIS — E113593 Type 2 diabetes mellitus with proliferative diabetic retinopathy without macular edema, bilateral: Secondary | ICD-10-CM | POA: Diagnosis not present

## 2023-02-24 DIAGNOSIS — H43812 Vitreous degeneration, left eye: Secondary | ICD-10-CM | POA: Diagnosis not present

## 2023-02-24 DIAGNOSIS — I10 Essential (primary) hypertension: Secondary | ICD-10-CM

## 2023-08-25 ENCOUNTER — Encounter (INDEPENDENT_AMBULATORY_CARE_PROVIDER_SITE_OTHER): Payer: 59 | Admitting: Ophthalmology

## 2023-09-08 ENCOUNTER — Encounter (INDEPENDENT_AMBULATORY_CARE_PROVIDER_SITE_OTHER): Payer: 59 | Admitting: Ophthalmology

## 2023-09-08 DIAGNOSIS — H35033 Hypertensive retinopathy, bilateral: Secondary | ICD-10-CM | POA: Diagnosis not present

## 2023-09-08 DIAGNOSIS — I10 Essential (primary) hypertension: Secondary | ICD-10-CM

## 2023-09-08 DIAGNOSIS — E113593 Type 2 diabetes mellitus with proliferative diabetic retinopathy without macular edema, bilateral: Secondary | ICD-10-CM

## 2023-09-08 DIAGNOSIS — H43812 Vitreous degeneration, left eye: Secondary | ICD-10-CM

## 2023-09-08 DIAGNOSIS — Z7984 Long term (current) use of oral hypoglycemic drugs: Secondary | ICD-10-CM

## 2023-10-02 ENCOUNTER — Emergency Department (HOSPITAL_COMMUNITY): Payer: 59

## 2023-10-02 ENCOUNTER — Emergency Department (HOSPITAL_COMMUNITY)
Admission: EM | Admit: 2023-10-02 | Discharge: 2023-10-02 | Disposition: A | Discharge status: Home / Self Care | Payer: 59 | Attending: Student | Admitting: Student

## 2023-10-02 ENCOUNTER — Other Ambulatory Visit: Payer: Self-pay

## 2023-10-02 DIAGNOSIS — E119 Type 2 diabetes mellitus without complications: Secondary | ICD-10-CM | POA: Diagnosis not present

## 2023-10-02 DIAGNOSIS — S161XXA Strain of muscle, fascia and tendon at neck level, initial encounter: Secondary | ICD-10-CM

## 2023-10-02 DIAGNOSIS — R079 Chest pain, unspecified: Secondary | ICD-10-CM | POA: Diagnosis not present

## 2023-10-02 DIAGNOSIS — Z7984 Long term (current) use of oral hypoglycemic drugs: Secondary | ICD-10-CM | POA: Insufficient documentation

## 2023-10-02 DIAGNOSIS — I10 Essential (primary) hypertension: Secondary | ICD-10-CM | POA: Insufficient documentation

## 2023-10-02 DIAGNOSIS — Z87891 Personal history of nicotine dependence: Secondary | ICD-10-CM | POA: Insufficient documentation

## 2023-10-02 DIAGNOSIS — M542 Cervicalgia: Secondary | ICD-10-CM | POA: Diagnosis present

## 2023-10-02 DIAGNOSIS — Z79899 Other long term (current) drug therapy: Secondary | ICD-10-CM | POA: Diagnosis not present

## 2023-10-02 DIAGNOSIS — X58XXXA Exposure to other specified factors, initial encounter: Secondary | ICD-10-CM | POA: Diagnosis not present

## 2023-10-02 LAB — CBC
HCT: 39.7 % (ref 36.0–46.0)
Hemoglobin: 12.9 g/dL (ref 12.0–15.0)
MCH: 28.5 pg (ref 26.0–34.0)
MCHC: 32.5 g/dL (ref 30.0–36.0)
MCV: 87.6 fL (ref 80.0–100.0)
Platelets: 308 10*3/uL (ref 150–400)
RBC: 4.53 MIL/uL (ref 3.87–5.11)
RDW: 13 % (ref 11.5–15.5)
WBC: 8.4 10*3/uL (ref 4.0–10.5)
nRBC: 0 % (ref 0.0–0.2)

## 2023-10-02 LAB — BASIC METABOLIC PANEL
Anion gap: 14 (ref 5–15)
BUN: 15 mg/dL (ref 8–23)
CO2: 23 mmol/L (ref 22–32)
Calcium: 9.3 mg/dL (ref 8.9–10.3)
Chloride: 101 mmol/L (ref 98–111)
Creatinine, Ser: 0.86 mg/dL (ref 0.44–1.00)
GFR, Estimated: >60 mL/min (ref >60)
Glucose, Bld: 389 mg/dL — ABNORMAL HIGH (ref 70–99)
Potassium: 4.4 mmol/L (ref 3.5–5.1)
Sodium: 138 mmol/L (ref 135–145)

## 2023-10-02 LAB — TROPONIN I (HIGH SENSITIVITY)
Troponin I (High Sensitivity): 7 ng/L (ref <18)
Troponin I (High Sensitivity): 7 ng/L (ref <18)

## 2023-10-02 MED ORDER — LIDOCAINE 5 % EX PTCH
1.0000 | MEDICATED_PATCH | CUTANEOUS | Status: DC
  Administered 2023-10-02: 1 via TRANSDERMAL
  Filled 2023-10-02: qty 1

## 2023-10-02 MED ORDER — LIDOCAINE 5 % EX PTCH: 1.0000 | MEDICATED_PATCH | CUTANEOUS | 0 refills | Status: DC

## 2023-10-02 MED ORDER — ACETAMINOPHEN 500 MG PO TABS
1000.0000 mg | ORAL_TABLET | Freq: Once | ORAL | Status: AC
  Administered 2023-10-02: 1000 mg via ORAL
  Filled 2023-10-02: qty 2

## 2023-10-02 MED ORDER — ACETAMINOPHEN 500 MG PO TABS: 1000.0000 mg | ORAL_TABLET | Freq: Three times a day (TID) | ORAL | 0 refills | Status: AC

## 2023-10-02 NOTE — ED Provider Notes (Signed)
[place] EMERGENCY DEPARTMENT AT [facility] Provider Note  CSN: [id number] Arrival date & time: 10/02/23 4132  Chief Complaint(s) Chest Pain  HPI Diana Coleman is a 72 y.o. female with PMH T2DM, HTN, HLD, osteomyelitis of bilateral feet status post bilateral BKA, osteomyelitis of the right middle finger status post partial amputation who presents emergency department for evaluation of neck pain.  States that for the last 3 days she has had neck pain along the distribution of the right trapezius.  No associated chest pain, shortness of breath, abdominal pain, nausea, vomiting, numbness, tingling, weakness or other systemic or neurologic complaints.  No known trauma to the neck or heavy lifting.  Of note, triage note states 1 week of chest pain but here in the emergency room on my exam and evaluation patient is not endorsing any chest pain.    Past Medical History Past Medical History:  Diagnosis Date   Cellulitis of foot, left 10/24/2014   hx/notes 10/24/2014   Charcot foot due to diabetes mellitus (HCC)    Hepatitis    Hep B    Hypertension    Osteomyelitis of right foot (HCC)    hx/notes 10/24/2014   Proliferative diabetic retinopathy (HCC)    right eye and vitreous hemorrhage   Type II diabetes mellitus (HCC)    Since 1998   Patient Active Problem List   Diagnosis Date Noted   Type 2 diabetes mellitus with hyperlipidemia (HCC) 01/06/2022   Hyperglycemia    Dyslipidemia 10/18/2020   Vitreous hemorrhage of right eye due to diabetes mellitus (HCC) 02/01/2019   Preop cardiovascular exam 10/22/2018   Chest pain 10/22/2018   Vitreous hemorrhage, right eye (HCC) 09/21/2018   Proliferative diabetic retinopathy with macular edema (HCC) 09/21/2018   Non-pressure chronic ulcer of left calf, limited to breakdown of skin (HCC) 11/12/2016   S/P BKA (below knee amputation) bilateral (HCC) 11/12/2016   Anemia 10/04/2016   Finger osteomyelitis, right (HCC) 10/04/2016   Finger  infection 10/04/2016   Osteomyelitis of finger (HCC) 10/04/2016   Gastroparesis 11/02/2014   Diabetes mellitus due to underlying condition with other specified complication (HCC)    Diabetic foot (HCC)    Nausea & vomiting    Hypertension complicating diabetes (HCC) 10/25/2014   GERD (gastroesophageal reflux disease) 10/25/2014   Cellulitis 10/24/2014   Diabetes (HCC) 10/24/2014   Insulin dependent diabetes mellitus (HCC) 10/24/2014   Home Medication(s) Prior to Admission medications   Medication Sig Start Date End Date Taking? Authorizing Provider  alendronate (FOSAMAX) 70 MG tablet Take 70 mg by mouth once a week. 10/01/23  Yes [provider]  gabapentin (NEURONTIN) 100 MG capsule Take 100 mg by mouth 3 (three) times daily. 08/22/23  Yes [provider]  JARDIANCE 25 MG TABS tablet Take 25 mg by mouth daily. 10/01/23  Yes [provider]  lisinopril-hydrochlorothiazide (ZESTORETIC) 20-12.5 MG tablet Take 1 tablet by mouth daily. 05/12/23  Yes [provider]  pantoprazole (PROTONIX) 40 MG tablet Take 40 mg by mouth daily. 08/03/23  Yes [provider]  acetaminophen (TYLENOL) 325 MG tablet Take 2 tablets (650 mg total) by mouth every 6 (six) hours as needed for mild pain, moderate pain or fever (or Fever >/= 101). 01/07/22   Hongalgi, Maximino Greenland, MD  amLODipine (NORVASC) 10 MG tablet Take 10 mg by mouth daily.    [provider]  atorvastatin (LIPITOR) 10 MG tablet Take 10 mg by mouth daily.  10/21/18   [provider]  Dulaglutide 1.5 MG/0.5ML SOPN Inject 4.5 mg into the skin every Thursday.    [provider]  ibuprofen (ADVIL) 400 MG tablet Take 1 tablet (400 mg total) by mouth every 6 (six) hours as needed. 10/10/22   Jeanelle Malling, PA  insulin degludec (TRESIBA) 100 UNIT/ML FlexTouch Pen Inject 35 Units into the skin daily.    [provider]  lisinopril (PRINIVIL,ZESTRIL) 10 MG tablet Take 1 tablet (10 mg total) by  mouth daily. 11/02/14   Jeralyn Bennett, MD  metFORMIN (GLUCOPHAGE) 1000 MG tablet Take 1,000 mg by mouth 2 (two) times daily with a meal.    [provider]  tizanidine (ZANAFLEX) 2 MG capsule Take 1 capsule (2 mg total) by mouth 3 (three) times daily. 10/10/22   Jeanelle Malling, PA                                                                                                                                    Past Surgical History Past Surgical History:  Procedure Laterality Date   ABOVE KNEE LEG AMPUTATION Right 2004   AMPUTATION Left 10/29/2014   Procedure: AMPUTATION BELOW KNEE - LEFT;  Surgeon: Nadara Mustard, MD;  Location: [facility] OR;  Service: Orthopedics;  Laterality: Left;   AMPUTATION Right 01/05/2022   Procedure: AMPUTATION TIP OF RIGHT INDEX FINGER;  Surgeon: Teryl Lucy, MD;  Location: [facility] OR;  Service: Orthopedics;  Laterality: Right;   CATARACT EXTRACTION W/ INTRAOCULAR LENS  IMPLANT, BILATERAL     FOOT SURGERY Left 1980's   "ulcer removed"   I & D EXTREMITY Right 10/04/2016   Procedure: IRRIGATION AND DEBRIDEMENT MIDDLE FINGER AND AMPUTATION;  Surgeon: Bradly Bienenstock, MD;  Location: [facility] OR;  Service: Orthopedics;  Laterality: Right;   I & D EXTREMITY Right 01/05/2022   Procedure: IRRIGATION AND DEBRIDEMENT RIGHT INDEX FINGER;  Surgeon: Teryl Lucy, MD;  Location: [facility] OR;  Service: Orthopedics;  Laterality: Right;   MEMBRANE PEEL Right 02/01/2019   Procedure: Loran Senters;  Surgeon: Sherrie George, MD;  Location: [facility] OR;  Service: Ophthalmology;  Laterality: Right;   PARS PLANA VITRECTOMY 27 GAUGE Right 02/01/2019   PARS PLANA VITRECTOMY 27 GAUGE Right 02/01/2019   Procedure: PARS PLANA VITRECTOMY 27 GAUGE, MEMBRANE PEEL, ENDOLASER, GAS INJECTION;  Surgeon: Sherrie George, MD;  Location: [facility] OR;  Service: Ophthalmology;  Laterality: Right;   REDUCTION MAMMAPLASTY Bilateral 1987   TOE AMPUTATION Left ~ 2011   "top of my 3rd toe"   TUBAL LIGATION  1973   Family History Family  History  Problem Relation Age of Onset   Uterine cancer Mother    Gout Father    Thyroid disease Sister     Social History Social History   Tobacco Use   Smoking status: Former    Current packs/day: 0.50    Average packs/day: 0.5 packs/day for 30.0 years (15.0 ttl pk-yrs)    Types: Cigarettes  Smokeless tobacco: Never   Tobacco comments:    "quit smoking ~ 2000"  Vaping Use   Vaping status: Never Used  Substance Use Topics   Alcohol use: No    Alcohol/week: 0.0 standard drinks of alcohol   Drug use: No   Allergies Clindamycin/lincomycin and Codeine  Review of Systems Review of Systems  Musculoskeletal:  Positive for neck pain.    Physical Exam Vital Signs  I have reviewed the triage vital signs BP (!) 163/88   Pulse 73   Temp 97.8 F (36.6 C)   Resp 14   Ht 5\' 7"  (1.702 m)   Wt 89.8 kg   SpO2 100%   BMI 31.01 kg/m   Physical Exam Vitals and nursing note reviewed.  Constitutional:      General: She is not in acute distress.    Appearance: She is well-developed.  HENT:     Head: Normocephalic and atraumatic.  Eyes:     Conjunctiva/sclera: Conjunctivae normal.  Cardiovascular:     Rate and Rhythm: Normal rate and regular rhythm.     Heart sounds: No murmur heard. Pulmonary:     Effort: Pulmonary effort is normal. No respiratory distress.     Breath sounds: Normal breath sounds.  Abdominal:     Palpations: Abdomen is soft.     Tenderness: There is no abdominal tenderness.  Musculoskeletal:        General: No swelling.     Cervical back: Neck supple. Tenderness present.  Skin:    General: Skin is warm and dry.     Capillary Refill: Capillary refill takes less than 2 seconds.  Neurological:     Mental Status: She is alert.  Psychiatric:        Mood and Affect: Mood normal.     ED Results and Treatments Labs (all labs ordered are listed, but only abnormal results are displayed) Labs Reviewed  BASIC METABOLIC PANEL - Abnormal; Notable for the  following components:      Result Value   Glucose, Bld 389 (*)    All other components within normal limits  CBC  TROPONIN I (HIGH SENSITIVITY)  TROPONIN I (HIGH SENSITIVITY)                                                                                                                          Radiology DG Chest 2 View  Result Date: 10/02/2023 CLINICAL DATA:  Chest pain EXAM: CHEST - 2 VIEW COMPARISON:  01/10/2022 interval implantable FINDINGS: Normal heart size and mediastinal contours. Cardiac device over the left chest. No acute infiltrate or edema. No effusion or pneumothorax. No acute osseous findings. IMPRESSION: No active cardiopulmonary disease. Electronically Signed   By: Tiburcio Pea M.D.   On: 10/02/2023 07:16    Pertinent labs & imaging results that were available during my care of the patient were reviewed by me and considered in my medical decision making (see MDM for details).  Medications Ordered in ED  Medications  lidocaine (LIDODERM) 5 % 1 patch (1 patch Transdermal Patch Applied 10/02/23 0848)  acetaminophen (TYLENOL) tablet 1,000 mg (1,000 mg Oral Given 10/02/23 0848)                                                                                                                                     Procedures Procedures  (including critical care time)  Medical Decision Making / ED Course   This patient presents to the ED for concern of neck pain, this involves an extensive number of treatment options, and is a complaint that carries with it a high risk of complications and morbidity.  The differential diagnosis includes cervical strain, torticollis, fracture, hematoma, contusion, atypical ACS  MDM: Patient seen in the emergency room for evaluation of neck pain.  Physical exam with reproducible tenderness over the distribution of the trapezius on the right.  Cardiopulmonary exam unremarkable.  Laboratory evaluation unremarkable outside of mild hyperglycemia 389  but bicarb is normal with no anion gap.  ECG nonischemic.  Chest x-ray unremarkable.  Patient given lidocaine patch and Tylenol on reevaluation her symptoms have resolved.  Very low suspicion for ACS at this time given no chest pain or shortness of breath.  Patient presentation consistent with cervical strain and at this time she does not meet inpatient criteria for admission.  Patient then discharged with outpatient follow-up.   Additional history obtained:  -External records from outside source obtained and reviewed including: Chart review including previous notes, labs, imaging, consultation notes   Lab Tests: -I ordered, reviewed, and interpreted labs.   The pertinent results include:   Labs Reviewed  BASIC METABOLIC PANEL - Abnormal; Notable for the following components:      Result Value   Glucose, Bld 389 (*)    All other components within normal limits  CBC  TROPONIN I (HIGH SENSITIVITY)  TROPONIN I (HIGH SENSITIVITY)      EKG   EKG Interpretation Date/Time:  Friday October 02 2023 05:44:08 EDT Ventricular Rate:  92 PR Interval:  186 QRS Duration:  114 QT Interval:  370 QTC Calculation: 457 R Axis:   70  Text Interpretation: Sinus rhythm with frequent and consecutive Premature ventricular complexes When compared with ECG of 05-Jan-2022 18:41, PREVIOUS ECG IS PRESENT Confirmed by Jovon Winterhalter ([id number]) on 10/02/2023 8:24:17 AM         Imaging Studies ordered: I ordered imaging studies including chest x-ray I independently visualized and interpreted imaging. I agree with the radiologist interpretation   Medicines ordered and prescription drug management: Meds ordered this encounter  Medications   lidocaine (LIDODERM) 5 % 1 patch   acetaminophen (TYLENOL) tablet 1,000 mg    -I have reviewed the patients home medicines and have made adjustments as needed  Critical interventions none   Cardiac Monitoring: The patient was maintained on a cardiac monitor.  I  personally viewed and interpreted the cardiac monitored which  showed an underlying rhythm of: NSR with PVCs  Social Determinants of Health:  Factors impacting patients care include: none   Reevaluation: After the interventions noted above, I reevaluated the patient and found that they have :improved  Co morbidities that complicate the patient evaluation  Past Medical History:  Diagnosis Date   Cellulitis of foot, left 10/24/2014   hx/notes 10/24/2014   Charcot foot due to diabetes mellitus (HCC)    Hepatitis    Hep B    Hypertension    Osteomyelitis of right foot (HCC)    hx/notes 10/24/2014   Proliferative diabetic retinopathy (HCC)    right eye and vitreous hemorrhage   Type II diabetes mellitus (HCC)    Since 1998      Dispostion: I considered admission for this patient, but at this time she does not meet inpatient criteria for admission she is safe for discharge with outpatient follow-up and return precautions which she voiced understanding.     Final Clinical Impression(s) / ED Diagnoses Final diagnoses:  None     @PCDICTATION @    Glendora Score, MD 10/02/23 [phone]

## 2023-10-02 NOTE — ED Triage Notes (Signed)
Pt BIB EMS for evaluation of chest pain for a week that has worsened, pt also with BGL 444, pt given aspirin 324mg , Pt reports she was unable to sleep and is also having right side neck pain. Rates pain 4/10, denies SHOB, nausea, vomiting, diaphoresis.

## 2024-03-17 ENCOUNTER — Encounter (INDEPENDENT_AMBULATORY_CARE_PROVIDER_SITE_OTHER): Payer: 59 | Admitting: Ophthalmology

## 2024-03-17 DIAGNOSIS — H35033 Hypertensive retinopathy, bilateral: Secondary | ICD-10-CM | POA: Diagnosis not present

## 2024-03-17 DIAGNOSIS — E113593 Type 2 diabetes mellitus with proliferative diabetic retinopathy without macular edema, bilateral: Secondary | ICD-10-CM

## 2024-03-17 DIAGNOSIS — H43812 Vitreous degeneration, left eye: Secondary | ICD-10-CM

## 2024-03-17 DIAGNOSIS — I10 Essential (primary) hypertension: Secondary | ICD-10-CM | POA: Diagnosis not present

## 2024-03-17 DIAGNOSIS — Z7984 Long term (current) use of oral hypoglycemic drugs: Secondary | ICD-10-CM | POA: Diagnosis not present

## 2024-05-02 ENCOUNTER — Emergency Department (HOSPITAL_COMMUNITY)

## 2024-05-02 ENCOUNTER — Encounter (HOSPITAL_COMMUNITY): Payer: Self-pay

## 2024-05-02 ENCOUNTER — Other Ambulatory Visit: Payer: Self-pay

## 2024-05-02 ENCOUNTER — Emergency Department (HOSPITAL_COMMUNITY)
Admission: EM | Admit: 2024-05-02 | Discharge: 2024-05-03 | Disposition: A | Discharge status: Home / Self Care | Attending: Emergency Medicine | Admitting: Emergency Medicine

## 2024-05-02 DIAGNOSIS — Z794 Long term (current) use of insulin: Secondary | ICD-10-CM | POA: Diagnosis not present

## 2024-05-02 DIAGNOSIS — R140 Abdominal distension (gaseous): Secondary | ICD-10-CM | POA: Insufficient documentation

## 2024-05-02 DIAGNOSIS — M25511 Pain in right shoulder: Secondary | ICD-10-CM | POA: Insufficient documentation

## 2024-05-02 DIAGNOSIS — D259 Leiomyoma of uterus, unspecified: Secondary | ICD-10-CM | POA: Diagnosis not present

## 2024-05-02 DIAGNOSIS — Y92410 Unspecified street and highway as the place of occurrence of the external cause: Secondary | ICD-10-CM | POA: Diagnosis not present

## 2024-05-02 DIAGNOSIS — M25551 Pain in right hip: Secondary | ICD-10-CM | POA: Diagnosis present

## 2024-05-02 DIAGNOSIS — E1165 Type 2 diabetes mellitus with hyperglycemia: Secondary | ICD-10-CM | POA: Insufficient documentation

## 2024-05-02 DIAGNOSIS — Z79899 Other long term (current) drug therapy: Secondary | ICD-10-CM | POA: Insufficient documentation

## 2024-05-02 DIAGNOSIS — I700 Atherosclerosis of aorta: Secondary | ICD-10-CM | POA: Diagnosis not present

## 2024-05-02 DIAGNOSIS — I6782 Cerebral ischemia: Secondary | ICD-10-CM | POA: Diagnosis not present

## 2024-05-02 DIAGNOSIS — I10 Essential (primary) hypertension: Secondary | ICD-10-CM | POA: Diagnosis not present

## 2024-05-02 DIAGNOSIS — Z7984 Long term (current) use of oral hypoglycemic drugs: Secondary | ICD-10-CM | POA: Insufficient documentation

## 2024-05-02 DIAGNOSIS — M25531 Pain in right wrist: Secondary | ICD-10-CM | POA: Diagnosis not present

## 2024-05-02 LAB — CBC WITH DIFFERENTIAL/PLATELET
Abs Immature Granulocytes: 0.03 10*3/uL (ref 0.00–0.07)
Basophils Absolute: 0.1 10*3/uL (ref 0.0–0.1)
Basophils Relative: 1 %
Eosinophils Absolute: 0.1 10*3/uL (ref 0.0–0.5)
Eosinophils Relative: 1 %
HCT: 41.7 % (ref 36.0–46.0)
Hemoglobin: 13.7 g/dL (ref 12.0–15.0)
Immature Granulocytes: 0 %
Lymphocytes Relative: 31 %
Lymphs Abs: 2.4 10*3/uL (ref 0.7–4.0)
MCH: 28.6 pg (ref 26.0–34.0)
MCHC: 32.9 g/dL (ref 30.0–36.0)
MCV: 87.1 fL (ref 80.0–100.0)
Monocytes Absolute: 0.6 10*3/uL (ref 0.1–1.0)
Monocytes Relative: 8 %
Neutro Abs: 4.6 10*3/uL (ref 1.7–7.7)
Neutrophils Relative %: 59 %
Platelets: 319 10*3/uL (ref 150–400)
RBC: 4.79 MIL/uL (ref 3.87–5.11)
RDW: 12.9 % (ref 11.5–15.5)
WBC: 7.7 10*3/uL (ref 4.0–10.5)
nRBC: 0 % (ref 0.0–0.2)

## 2024-05-02 LAB — COMPREHENSIVE METABOLIC PANEL WITH GFR
ALT: 9 U/L (ref 0–44)
AST: 13 U/L — ABNORMAL LOW (ref 15–41)
Albumin: 3.7 g/dL (ref 3.5–5.0)
Alkaline Phosphatase: 90 U/L (ref 38–126)
Anion gap: 9 (ref 5–15)
BUN: 21 mg/dL (ref 8–23)
CO2: 25 mmol/L (ref 22–32)
Calcium: 9.6 mg/dL (ref 8.9–10.3)
Chloride: 101 mmol/L (ref 98–111)
Creatinine, Ser: 1.04 mg/dL — ABNORMAL HIGH (ref 0.44–1.00)
GFR, Estimated: 56 mL/min — ABNORMAL LOW (ref >60)
Glucose, Bld: 376 mg/dL — ABNORMAL HIGH (ref 70–99)
Potassium: 4.2 mmol/L (ref 3.5–5.1)
Sodium: 135 mmol/L (ref 135–145)
Total Bilirubin: 0.7 mg/dL (ref 0.0–1.2)
Total Protein: 7.3 g/dL (ref 6.5–8.1)

## 2024-05-02 LAB — CBG MONITORING, ED: Glucose-Capillary: 359 mg/dL — ABNORMAL HIGH (ref 70–99)

## 2024-05-02 LAB — I-STAT CHEM 8, ED
BUN: 24 mg/dL — ABNORMAL HIGH (ref 8–23)
Calcium, Ion: 1.26 mmol/L (ref 1.15–1.40)
Chloride: 100 mmol/L (ref 98–111)
Creatinine, Ser: 1 mg/dL (ref 0.44–1.00)
Glucose, Bld: 378 mg/dL — ABNORMAL HIGH (ref 70–99)
HCT: 43 % (ref 36.0–46.0)
Hemoglobin: 14.6 g/dL (ref 12.0–15.0)
Potassium: 4.2 mmol/L (ref 3.5–5.1)
Sodium: 138 mmol/L (ref 135–145)
TCO2: 25 mmol/L (ref 22–32)

## 2024-05-02 MED ORDER — SODIUM CHLORIDE 0.9 % IV BOLUS
1000.0000 mL | Freq: Once | INTRAVENOUS | Status: AC
  Administered 2024-05-02: 1000 mL via INTRAVENOUS

## 2024-05-02 MED ORDER — IOHEXOL 350 MG/ML SOLN
75.0000 mL | Freq: Once | INTRAVENOUS | Status: AC | PRN
  Administered 2024-05-02: 75 mL via INTRAVENOUS
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
## 2024-05-03 DIAGNOSIS — M25551 Pain in right hip: Secondary | ICD-10-CM | POA: Diagnosis not present

## 2024-05-03 LAB — SEDIMENTATION RATE: Sed Rate: 9 mm/h (ref 0–22)

## 2024-05-03 LAB — C-REACTIVE PROTEIN: CRP: <0.5 mg/dL (ref <1.0)
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
## 2024-08-08 ENCOUNTER — Encounter: Payer: Self-pay | Admitting: Physician Assistant

## 2024-08-08 ENCOUNTER — Ambulatory Visit (INDEPENDENT_AMBULATORY_CARE_PROVIDER_SITE_OTHER): Admitting: Physician Assistant

## 2024-08-08 ENCOUNTER — Other Ambulatory Visit: Payer: Self-pay

## 2024-08-08 DIAGNOSIS — Z89511 Acquired absence of right leg below knee: Secondary | ICD-10-CM

## 2024-08-08 DIAGNOSIS — M1712 Unilateral primary osteoarthritis, left knee: Secondary | ICD-10-CM | POA: Diagnosis not present

## 2024-08-08 DIAGNOSIS — Z89512 Acquired absence of left leg below knee: Secondary | ICD-10-CM | POA: Diagnosis not present

## 2024-08-08 DIAGNOSIS — M25562 Pain in left knee: Secondary | ICD-10-CM

## 2024-08-08 NOTE — Progress Notes (Signed)
 Office Visit Note   Patient: Diana Coleman           Date of Birth: 1951-08-18           MRN: [id number] Visit Date: 08/08/2024              Requested by: Delores Rojelio Caldron, NP [address] [place],  [place] [address] PCP: Delores Rojelio Caldron, NP  No chief complaint on file.     HPI: 73 y/o female with history of B BKA.  She was last seen by our office on 12/02/22.  She was in need of new prosthetic liners and supplies.  She has an acute complaint of left knee pain that started 24 hours ago.  She is usually ambulating with a cane, but the knee pain has forced her to use a walker for more support.    Assessment & Plan: Visit Diagnoses: No diagnosis found.  Plan: She was offered a Cortizone injection for her left knee osteoarthritis symptoms and she declined.  We will have her return to [employer] for new prosthetic shell, liners and supplies.    Follow-Up Instructions: No follow-ups on file.   Ortho Exam  Patient is alert, oriented, no adenopathy, well-dressed, normal affect, normal respiratory effort. Left knee crepitus with active range of motion and pain at full extension.  No edema or erythema in the left knee.   No stump skin breakdown B LE.  Breakdown of liners.    Patient is an existing bilateral transtibial  amputee.  Patient's current comorbidities are not expected to impact the ability to function with the prescribed prosthesis. Patient verbally communicates a strong desire to use a prosthesis. Patient currently requires mobility aids to ambulate without a prosthesis.  Expects not to use mobility aids with a new prosthesis. Patient is expected to resume or reach their K Level within 6 months. Patient was active before the amputation and independent with stairs, uneven terrain, varying cadence, and a community ambulator.  Patient is a K3 level ambulator that spends a lot of time walking around on uneven terrain over obstacles, up and down stairs, and ambulates  with a variable cadence.     Imaging: No results found. No images are attached to the encounter.  Labs: Lab Results  Component Value Date   HGBA1C 10.3 (H) 01/06/2022   HGBA1C 8.0 (H) 10/04/2016   HGBA1C 8.0 (H) 10/24/2014   ESRSEDRATE 9 05/02/2024   ESRSEDRATE 62 (H) 10/04/2016   ESRSEDRATE 92 (H) 10/25/2014   CRP <0.5 05/02/2024   CRP 2.0 (H) 10/04/2016   CRP 28.6 (H) 10/25/2014   REPTSTATUS 01/12/2022 FINAL 01/05/2022   GRAMSTAIN  01/05/2022    NO SQUAMOUS EPITHELIAL CELLS SEEN FEW WBC SEEN NO ORGANISMS SEEN Performed at [facility], [address], [place], [place] [address]    CULT  01/05/2022    FEW HAEMOPHILUS PARAINFLUENZAE RARE AGGREGATIBACTER SEGNIS BETA LACTAMASE POSITIVE RARE PEPTOSTREPTOCOCCUS SPECIES      Lab Results  Component Value Date   ALBUMIN 3.7 05/02/2024   ALBUMIN 3.1 (L) 01/05/2022   ALBUMIN 3.1 (L) 02/01/2019    No results found for: MG No results found for: VD25OH  No results found for: PREALBUMIN    Latest Ref Rng & Units 05/02/2024   10:23 PM 05/02/2024   10:18 PM 10/02/2023    5:50 AM  CBC EXTENDED  WBC 4.0 - 10.5 K/uL  7.7  8.4   RBC 3.87 - 5.11 MIL/uL  4.79  4.53   Hemoglobin 12.0 - 15.0 g/dL 85.3  86.2  87.0   HCT 36.0 - 46.0 % 43.0  41.7  39.7   Platelets 150 - 400 K/uL  319  308   NEUT# 1.7 - 7.7 K/uL  4.6    Lymph# 0.7 - 4.0 K/uL  2.4       There is no height or weight on file to calculate BMI.  Orders:  No orders of the defined types were placed in this encounter.  No orders of the defined types were placed in this encounter.    Procedures: No procedures performed  Clinical Data: No additional findings.  ROS:  All other systems negative, except as noted in the HPI. Review of Systems  Objective: Vital Signs: There were no vitals taken for this visit.  Specialty Comments:  No specialty comments available.  PMFS History: Patient Active Problem List   Diagnosis Date Noted   Type 2  diabetes mellitus with hyperlipidemia (HCC) 01/06/2022   Hyperglycemia    Dyslipidemia 10/18/2020   Vitreous hemorrhage of right eye due to diabetes mellitus (HCC) 02/01/2019   Preop cardiovascular exam 10/22/2018   Chest pain 10/22/2018   Vitreous hemorrhage, right eye (HCC) 09/21/2018   Proliferative diabetic retinopathy with macular edema (HCC) 09/21/2018   Non-pressure chronic ulcer of left calf, limited to breakdown of skin (HCC) 11/12/2016   S/P BKA (below knee amputation) bilateral (HCC) 11/12/2016   Anemia 10/04/2016   Finger osteomyelitis, right (HCC) 10/04/2016   Finger infection 10/04/2016   Osteomyelitis of finger (HCC) 10/04/2016   Gastroparesis 11/02/2014   Diabetes mellitus due to underlying condition with other specified complication (HCC)    Diabetic foot (HCC)    Nausea & vomiting    Hypertension complicating diabetes (HCC) 10/25/2014   GERD (gastroesophageal reflux disease) 10/25/2014   Cellulitis 10/24/2014   Diabetes (HCC) 10/24/2014   Insulin  dependent diabetes mellitus (HCC) 10/24/2014   Past Medical History:  Diagnosis Date   Cellulitis of foot, left 10/24/2014   hx/notes 10/24/2014   Charcot foot due to diabetes mellitus (HCC)    Hepatitis    Hep B    Hypertension    Osteomyelitis of right foot (HCC)    hx/notes 10/24/2014   Proliferative diabetic retinopathy (HCC)    right eye and vitreous hemorrhage   Type II diabetes mellitus (HCC)    Since 1998    Family History  Problem Relation Age of Onset   Uterine cancer Mother    Gout Father    Thyroid  disease Sister     Past Surgical History:  Procedure Laterality Date   ABOVE KNEE LEG AMPUTATION Right 2004   AMPUTATION Left 10/29/2014   Procedure: AMPUTATION BELOW KNEE - LEFT;  Surgeon: Jerona Harden GAILS, MD;  Location: [facility] OR;  Service: Orthopedics;  Laterality: Left;   AMPUTATION Right 01/05/2022   Procedure: AMPUTATION TIP OF RIGHT INDEX FINGER;  Surgeon: Josefina Chew, MD;  Location: [facility] OR;  Service:  Orthopedics;  Laterality: Right;   CATARACT EXTRACTION W/ INTRAOCULAR LENS  IMPLANT, BILATERAL     FOOT SURGERY Left 1980's   ulcer removed   I & D EXTREMITY Right 10/04/2016   Procedure: IRRIGATION AND DEBRIDEMENT MIDDLE FINGER AND AMPUTATION;  Surgeon: Prentice Pagan, MD;  Location: [facility] OR;  Service: Orthopedics;  Laterality: Right;   I & D EXTREMITY Right 01/05/2022   Procedure: IRRIGATION AND DEBRIDEMENT RIGHT INDEX FINGER;  Surgeon: Josefina Chew, MD;  Location: [facility] OR;  Service: Orthopedics;  Laterality: Right;   MEMBRANE PEEL Right 02/01/2019   Procedure: LIN BOOTY;  Surgeon: Alvia Norleen BIRCH, MD;  Location: [facility] OR;  Service: Ophthalmology;  Laterality: Right;   PARS PLANA VITRECTOMY 27 GAUGE Right 02/01/2019   PARS PLANA VITRECTOMY 27 GAUGE Right 02/01/2019   Procedure: PARS PLANA VITRECTOMY 27 GAUGE, MEMBRANE PEEL, ENDOLASER, GAS INJECTION;  Surgeon: Alvia Norleen BIRCH, MD;  Location: [facility] OR;  Service: Ophthalmology;  Laterality: Right;   REDUCTION MAMMAPLASTY Bilateral 1987   TOE AMPUTATION Left ~ 2011   top of my 3rd toe   TUBAL LIGATION  1973   Social History   Occupational History   Not on file  Tobacco Use   Smoking status: Former    Current packs/day: 0.50    Average packs/day: 0.5 packs/day for 30.0 years (15.0 ttl pk-yrs)    Types: Cigarettes   Smokeless tobacco: Never   Tobacco comments:    quit smoking ~ 2000  Vaping Use   Vaping status: Never Used  Substance and Sexual Activity   Alcohol use: No    Alcohol/week: 0.0 standard drinks of alcohol   Drug use: No   Sexual activity: Never

## 2024-08-30 ENCOUNTER — Other Ambulatory Visit: Payer: Self-pay

## 2024-08-30 ENCOUNTER — Emergency Department (HOSPITAL_COMMUNITY)
Admission: EM | Admit: 2024-08-30 | Discharge: 2024-08-30 | Disposition: A | Discharge status: Home / Self Care | Source: Home / Self Care | Attending: Emergency Medicine | Admitting: Emergency Medicine

## 2024-08-30 ENCOUNTER — Encounter (HOSPITAL_COMMUNITY): Payer: Self-pay

## 2024-08-30 DIAGNOSIS — Z794 Long term (current) use of insulin: Secondary | ICD-10-CM | POA: Insufficient documentation

## 2024-08-30 DIAGNOSIS — E162 Hypoglycemia, unspecified: Secondary | ICD-10-CM

## 2024-08-30 DIAGNOSIS — E11649 Type 2 diabetes mellitus with hypoglycemia without coma: Secondary | ICD-10-CM | POA: Insufficient documentation

## 2024-08-30 DIAGNOSIS — E10649 Type 1 diabetes mellitus with hypoglycemia without coma: Secondary | ICD-10-CM | POA: Diagnosis not present

## 2024-08-30 DIAGNOSIS — Z7984 Long term (current) use of oral hypoglycemic drugs: Secondary | ICD-10-CM | POA: Insufficient documentation

## 2024-08-30 LAB — CBC
HCT: 40.9 % (ref 36.0–46.0)
Hemoglobin: 12.7 g/dL (ref 12.0–15.0)
MCH: 28.2 pg (ref 26.0–34.0)
MCHC: 31.1 g/dL (ref 30.0–36.0)
MCV: 90.7 fL (ref 80.0–100.0)
Platelets: 280 K/uL (ref 150–400)
RBC: 4.51 MIL/uL (ref 3.87–5.11)
RDW: 13.3 % (ref 11.5–15.5)
WBC: 5.8 K/uL (ref 4.0–10.5)
nRBC: 0 % (ref 0.0–0.2)

## 2024-08-30 LAB — BASIC METABOLIC PANEL WITH GFR
Anion gap: 10 (ref 5–15)
BUN: 28 mg/dL — ABNORMAL HIGH (ref 8–23)
CO2: 25 mmol/L (ref 22–32)
Calcium: 8.4 mg/dL — ABNORMAL LOW (ref 8.9–10.3)
Chloride: 105 mmol/L (ref 98–111)
Creatinine, Ser: 0.86 mg/dL (ref 0.44–1.00)
GFR, Estimated: >60 mL/min (ref >60)
Glucose, Bld: 68 mg/dL — ABNORMAL LOW (ref 70–99)
Potassium: 3.6 mmol/L (ref 3.5–5.1)
Sodium: 140 mmol/L (ref 135–145)

## 2024-08-30 LAB — CBG MONITORING, ED
Glucose-Capillary: 88 mg/dL (ref 70–99)
Glucose-Capillary: 83 mg/dL (ref 70–99)
Glucose-Capillary: 144 mg/dL — ABNORMAL HIGH (ref 70–99)

## 2024-08-30 MED ORDER — INSULIN DEGLUDEC 100 UNIT/ML ~~LOC~~ SOPN: 30.0000 [IU] | PEN_INJECTOR | Freq: Every day | SUBCUTANEOUS | 0 refills | Status: AC

## 2024-08-30 NOTE — ED Notes (Signed)
 Patient given orange juice

## 2024-08-30 NOTE — ED Triage Notes (Signed)
 Patient BIB [facility] from home. Family called out this morning due to altered mental status and patient looking sweaty. For the past 3 days patient has had hypoglycemic episodes. Today EMS arrived and sugar was 45. EMS gave 25g D10 and sugar came up to 144. Patient takes 40 units of tresiba  in the mornings and stated she eats with it. Feels lightheaded but not nauseous. Alert and oriented x4 on arrival.

## 2024-08-30 NOTE — Discharge Instructions (Addendum)
 Your medications were reviewed and discussed with pharmacist.  We are decreasing your Tresiba  to 30 units a day from your current 35. Please have family members closely monitor you for changes in mental status and low blood sugar.  Please continue frequent oral intake. Please call your primary care doctor today for recheck in 1 to 2 days and further monitoring. Please record your blood sugar at home every 1 hour until follow-up

## 2024-08-30 NOTE — ED Notes (Signed)
 Patients family is on the way with patients prosthetic legs and clothes.

## 2024-08-30 NOTE — ED Notes (Signed)
 Patient given biscuit and orange juice.

## 2024-08-30 NOTE — ED Provider Notes (Signed)
 [place] EMERGENCY DEPARTMENT AT [facility] Provider Note   CSN: [id number] Arrival date & time: 08/30/24  9266     Patient presents with: Hypoglycemia   Diana Coleman is a 73 y.o. female.   HPI 73 year old female brought in from EMS with report of altered mental status and blood sugar of 45.  Patient reports she did not know what was happening until she woke up with people surrounding her.  She states this has happened in the past 2 days and EMS came out and she was able to get her blood sugar back up and did not transport.  Patient normally lives alone but her sister has been staying with her.  She reports no recent changes in her medications.  She takes her diabetes medications at night.  She reports normal intake.  She does report a 10 pound weight loss over the past couple of months.  She has bilateral lower extremity amputations but is able to care for herself and gets around her house with prosthetics.  She denies any recent changes in her health including no headaches, neck pain, chest pain, abdominal pain, nausea, vomiting, diarrhea, rectal bleeding.  She reports this has not previously happened she is followed by [facility]    Prior to Admission medications   Medication Sig Start Date End Date Taking? Authorizing Provider  alendronate (FOSAMAX) 70 MG tablet Take 70 mg by mouth once a week. Every Thursday 10/01/23  Yes [provider]  atorvastatin  (LIPITOR) 20 MG tablet Take 20 mg by mouth daily. 07/26/24  Yes [provider]  Dulaglutide  (TRULICITY ) 4.5 MG/0.5ML SOAJ Inject 4.5 mg into the skin once a week. Every Thursday   Yes [provider]  JARDIANCE 25 MG TABS tablet Take 25 mg by mouth daily. 10/01/23  Yes [provider]  lisinopril -hydrochlorothiazide  (ZESTORETIC ) 20-12.5 MG tablet Take 1 tablet by mouth daily. 05/12/23  Yes [provider]  pantoprazole  (PROTONIX ) 40 MG tablet Take 40 mg by mouth daily. 08/03/23  Yes  [provider]  insulin  degludec (TRESIBA ) 100 UNIT/ML FlexTouch Pen Inject 30 Units into the skin daily. 08/30/24   Levander Houston, MD    Allergies: Clindamycin/lincomycin and Codeine    Review of Systems  Updated Vital Signs BP (!) 168/84   Pulse 76   Temp (!) 97.4 F (36.3 C) (Oral)   Resp 13   Ht 1.702 m (5' 7)   Wt 81.2 kg   SpO2 100%   BMI 28.04 kg/m   Physical Exam Vitals and nursing note reviewed.  Constitutional:      Appearance: Normal appearance.  HENT:     Head: Normocephalic and atraumatic.     Right Ear: External ear normal.     Left Ear: External ear normal.     Nose: Nose normal.     Mouth/Throat:     Mouth: Mucous membranes are dry.  Eyes:     Pupils: Pupils are equal, round, and reactive to light.  Cardiovascular:     Rate and Rhythm: Normal rate and regular rhythm.     Pulses: Normal pulses.     Heart sounds: Normal heart sounds.  Pulmonary:     Effort: Pulmonary effort is normal.  Abdominal:     General: Abdomen is flat. Bowel sounds are normal.     Palpations: Abdomen is soft.  Musculoskeletal:        General: Normal range of motion.     Cervical back: Normal range of motion.  Comments: Bilateral BKA is well-healed  Skin:    General: Skin is warm and dry.     Capillary Refill: Capillary refill takes less than 2 seconds.  Neurological:     General: No focal deficit present.     Mental Status: She is alert.  Psychiatric:        Mood and Affect: Mood normal.     (all labs ordered are listed, but only abnormal results are displayed) Labs Reviewed  BASIC METABOLIC PANEL WITH GFR - Abnormal; Notable for the following components:      Result Value   Glucose, Bld 68 (*)    BUN 28 (*)    Calcium  8.4 (*)    All other components within normal limits  CBG MONITORING, ED - Abnormal; Notable for the following components:   Glucose-Capillary 144 (*)    All other components within normal limits  CBC  CBG MONITORING, ED  CBG  MONITORING, ED    EKG: None  Radiology: No results found.   .Critical Care  Performed by: Levander Houston, MD Authorized by: Levander Houston, MD   Critical care provider statement:    Critical care time (minutes):  45   Critical care time was exclusive of:  Separately billable procedures and treating other patients and teaching time   Critical care was necessary to treat or prevent imminent or life-threatening deterioration of the following conditions:  Endocrine crisis   Critical care was time spent personally by me on the following activities:  Development of treatment plan with patient or surrogate, discussions with consultants, evaluation of patient's response to treatment, examination of patient, ordering and review of laboratory studies, ordering and review of radiographic studies, ordering and performing treatments and interventions, pulse oximetry, re-evaluation of patient's condition and review of old charts    Medications Ordered in the ED - No data to display  Clinical Course as of 08/30/24 1117  Tue Aug 30, 2024  0850 [place] reviewed interpreted similar for mild hypoglycemia at 68 mild hypocalcemia CBC is reviewed interpreted normal [DR]    Clinical Course User Index [DR] Levander Houston, MD                                 Medical Decision Making Amount and/or Complexity of Data Reviewed Labs: ordered.  72 year old female presents today with reports of altered mental status.  Patient was hypoglycemic prehospital received IV glucose with improvement in her mental status. Differential diagnosis includes but is not limited to hypoglycemia, stroke, other metabolic abnormalities. Patient is awake and alert here.  Blood sugar is 68. Patient is evaluated with basic metabolic panel which shows mild hypoglycemia otherwise electrolytes are normal CBC is normal Hypoglycemia is likely etiology based on history and workup here.  Patient is on multiple medications which could impact  blood sugar.  She does report some recent weight loss. Patient takes dulaglutide  every Thursday Tresiba  35 units at night, Jardiance 25 mg at night Request pharmacist to review and we will make medication and adjustments to prevent future a.m. hypoglycemia Will give p.o. here and reevaluate blood sugar  Blood sugar trended here in the ED 8883 and now 144.  Patient is taking p.o. without difficulty Consulted pharmacist as below  . Her med rec is complete and her CBG lowering meds include dulaglutide , Tresiba , and Jardiance. Insulin  has the highest incidence of hypoglycemia of the three, so I would opt to decrease her Tresiba  at  discharge. Based on chart review, looks like she was on Tresiba  35 units daily in the past. I would probably decrease to 30 units daily (~20% reduction) and her follow-up with her outpatient provider soon.   Patient presents today with symptomatic hypoglycemia.  Patient evaluated here in the ED with labs and trended blood sugars.  Patient taking p.o. without difficulty.  Plan decrease Tresiba  to 30 units, close monitoring at home, and follow-up with PMD in the next 1 to 2 days    Final diagnoses:  Hypoglycemia    ED Discharge Orders          Ordered    insulin  degludec (TRESIBA ) 100 UNIT/ML FlexTouch Pen  Daily        08/30/24 1116               Levander Houston, MD 08/30/24 1117

## 2024-09-01 ENCOUNTER — Inpatient Hospital Stay (HOSPITAL_COMMUNITY)
Admission: EM | Admit: 2024-09-01 | Discharge: 2024-09-04 | DRG: 638 | Disposition: A | Discharge status: Home / Self Care | Attending: Family Medicine | Admitting: Family Medicine

## 2024-09-01 ENCOUNTER — Emergency Department (HOSPITAL_COMMUNITY)

## 2024-09-01 ENCOUNTER — Other Ambulatory Visit: Payer: Self-pay

## 2024-09-01 ENCOUNTER — Encounter (HOSPITAL_COMMUNITY): Payer: Self-pay

## 2024-09-01 DIAGNOSIS — Z7985 Long-term (current) use of injectable non-insulin antidiabetic drugs: Secondary | ICD-10-CM

## 2024-09-01 DIAGNOSIS — Q399 Congenital malformation of esophagus, unspecified: Secondary | ICD-10-CM

## 2024-09-01 DIAGNOSIS — Z881 Allergy status to other antibiotic agents status: Secondary | ICD-10-CM

## 2024-09-01 DIAGNOSIS — K5730 Diverticulosis of large intestine without perforation or abscess without bleeding: Secondary | ICD-10-CM | POA: Diagnosis present

## 2024-09-01 DIAGNOSIS — Z885 Allergy status to narcotic agent status: Secondary | ICD-10-CM

## 2024-09-01 DIAGNOSIS — Z89512 Acquired absence of left leg below knee: Secondary | ICD-10-CM

## 2024-09-01 DIAGNOSIS — E10649 Type 1 diabetes mellitus with hypoglycemia without coma: Principal | ICD-10-CM | POA: Diagnosis present

## 2024-09-01 DIAGNOSIS — Z794 Long term (current) use of insulin: Secondary | ICD-10-CM

## 2024-09-01 DIAGNOSIS — Z961 Presence of intraocular lens: Secondary | ICD-10-CM | POA: Diagnosis present

## 2024-09-01 DIAGNOSIS — E162 Hypoglycemia, unspecified: Secondary | ICD-10-CM | POA: Diagnosis present

## 2024-09-01 DIAGNOSIS — E785 Hyperlipidemia, unspecified: Secondary | ICD-10-CM | POA: Diagnosis present

## 2024-09-01 DIAGNOSIS — Z79899 Other long term (current) drug therapy: Secondary | ICD-10-CM

## 2024-09-01 DIAGNOSIS — K869 Disease of pancreas, unspecified: Secondary | ICD-10-CM | POA: Insufficient documentation

## 2024-09-01 DIAGNOSIS — Z89511 Acquired absence of right leg below knee: Secondary | ICD-10-CM

## 2024-09-01 DIAGNOSIS — I10 Essential (primary) hypertension: Secondary | ICD-10-CM | POA: Diagnosis present

## 2024-09-01 DIAGNOSIS — K219 Gastro-esophageal reflux disease without esophagitis: Secondary | ICD-10-CM | POA: Diagnosis present

## 2024-09-01 DIAGNOSIS — R200 Anesthesia of skin: Secondary | ICD-10-CM | POA: Diagnosis present

## 2024-09-01 DIAGNOSIS — E1169 Type 2 diabetes mellitus with other specified complication: Principal | ICD-10-CM

## 2024-09-01 DIAGNOSIS — K222 Esophageal obstruction: Secondary | ICD-10-CM | POA: Diagnosis present

## 2024-09-01 DIAGNOSIS — K3189 Other diseases of stomach and duodenum: Secondary | ICD-10-CM | POA: Diagnosis present

## 2024-09-01 DIAGNOSIS — I700 Atherosclerosis of aorta: Secondary | ICD-10-CM | POA: Diagnosis present

## 2024-09-01 DIAGNOSIS — Z9841 Cataract extraction status, right eye: Secondary | ICD-10-CM

## 2024-09-01 DIAGNOSIS — Z8349 Family history of other endocrine, nutritional and metabolic diseases: Secondary | ICD-10-CM

## 2024-09-01 DIAGNOSIS — Z8619 Personal history of other infectious and parasitic diseases: Secondary | ICD-10-CM

## 2024-09-01 DIAGNOSIS — Z89021 Acquired absence of right finger(s): Secondary | ICD-10-CM

## 2024-09-01 DIAGNOSIS — Z7984 Long term (current) use of oral hypoglycemic drugs: Secondary | ICD-10-CM

## 2024-09-01 DIAGNOSIS — Z89611 Acquired absence of right leg above knee: Secondary | ICD-10-CM

## 2024-09-01 DIAGNOSIS — F17210 Nicotine dependence, cigarettes, uncomplicated: Secondary | ICD-10-CM | POA: Diagnosis present

## 2024-09-01 DIAGNOSIS — H9311 Tinnitus, right ear: Secondary | ICD-10-CM | POA: Diagnosis present

## 2024-09-01 DIAGNOSIS — K862 Cyst of pancreas: Secondary | ICD-10-CM | POA: Diagnosis present

## 2024-09-01 DIAGNOSIS — Z7983 Long term (current) use of bisphosphonates: Secondary | ICD-10-CM

## 2024-09-01 DIAGNOSIS — R202 Paresthesia of skin: Secondary | ICD-10-CM | POA: Diagnosis present

## 2024-09-01 DIAGNOSIS — Z8049 Family history of malignant neoplasm of other genital organs: Secondary | ICD-10-CM

## 2024-09-01 DIAGNOSIS — Z9842 Cataract extraction status, left eye: Secondary | ICD-10-CM

## 2024-09-01 LAB — CBC WITH DIFFERENTIAL/PLATELET
Abs Immature Granulocytes: 0.03 K/uL (ref 0.00–0.07)
Basophils Absolute: 0 K/uL (ref 0.0–0.1)
Basophils Relative: 1 %
Eosinophils Absolute: 0.1 K/uL (ref 0.0–0.5)
Eosinophils Relative: 2 %
HCT: 40 % (ref 36.0–46.0)
Hemoglobin: 12.3 g/dL (ref 12.0–15.0)
Immature Granulocytes: 0 %
Lymphocytes Relative: 26 %
Lymphs Abs: 1.8 K/uL (ref 0.7–4.0)
MCH: 27.7 pg (ref 26.0–34.0)
MCHC: 30.8 g/dL (ref 30.0–36.0)
MCV: 90.1 fL (ref 80.0–100.0)
Monocytes Absolute: 0.5 K/uL (ref 0.1–1.0)
Monocytes Relative: 7 %
Neutro Abs: 4.4 K/uL (ref 1.7–7.7)
Neutrophils Relative %: 64 %
Platelets: 290 K/uL (ref 150–400)
RBC: 4.44 MIL/uL (ref 3.87–5.11)
RDW: 13.7 % (ref 11.5–15.5)
WBC: 6.9 K/uL (ref 4.0–10.5)
nRBC: 0 % (ref 0.0–0.2)

## 2024-09-01 LAB — COMPREHENSIVE METABOLIC PANEL WITH GFR
ALT: 8 U/L (ref 0–44)
AST: 19 U/L (ref 15–41)
Albumin: 3.5 g/dL (ref 3.5–5.0)
Alkaline Phosphatase: 93 U/L (ref 38–126)
Anion gap: 11 (ref 5–15)
BUN: 24 mg/dL — ABNORMAL HIGH (ref 8–23)
CO2: 24 mmol/L (ref 22–32)
Calcium: 8.9 mg/dL (ref 8.9–10.3)
Chloride: 107 mmol/L (ref 98–111)
Creatinine, Ser: 0.93 mg/dL (ref 0.44–1.00)
GFR, Estimated: >60 mL/min (ref >60)
Glucose, Bld: 174 mg/dL — ABNORMAL HIGH (ref 70–99)
Potassium: 4.4 mmol/L (ref 3.5–5.1)
Sodium: 142 mmol/L (ref 135–145)
Total Bilirubin: 0.4 mg/dL (ref 0.0–1.2)
Total Protein: 6.4 g/dL — ABNORMAL LOW (ref 6.5–8.1)

## 2024-09-01 LAB — CBG MONITORING, ED: Glucose-Capillary: 62 mg/dL — ABNORMAL LOW (ref 70–99)

## 2024-09-01 LAB — GLUCOSE, CAPILLARY
Glucose-Capillary: 205 mg/dL — ABNORMAL HIGH (ref 70–99)
Glucose-Capillary: 244 mg/dL — ABNORMAL HIGH (ref 70–99)
Glucose-Capillary: 297 mg/dL — ABNORMAL HIGH (ref 70–99)

## 2024-09-01 LAB — LIPASE, BLOOD: Lipase: 26 U/L (ref 11–51)

## 2024-09-01 MED ORDER — LISINOPRIL 20 MG PO TABS
20.0000 mg | ORAL_TABLET | Freq: Every day | ORAL | Status: DC
  Administered 2024-09-01 – 2024-09-04 (×4): 20 mg via ORAL
  Filled 2024-09-01: qty 1
  Filled 2024-09-01: qty 2
  Filled 2024-09-01 (×2): qty 1

## 2024-09-01 MED ORDER — ACETAMINOPHEN 650 MG RE SUPP: 650.0000 mg | Freq: Four times a day (QID) | RECTAL | Status: DC | PRN

## 2024-09-01 MED ORDER — SODIUM CHLORIDE 0.9% FLUSH: 3.0000 mL | INTRAVENOUS | Status: DC | PRN

## 2024-09-01 MED ORDER — DEXTROSE-SODIUM CHLORIDE 5-0.9 % IV SOLN: INTRAVENOUS | Status: DC

## 2024-09-01 MED ORDER — SODIUM CHLORIDE 0.9% FLUSH
3.0000 mL | Freq: Two times a day (BID) | INTRAVENOUS | Status: DC
  Administered 2024-09-02 – 2024-09-04 (×4): 3 mL via INTRAVENOUS

## 2024-09-01 MED ORDER — ONDANSETRON HCL 4 MG PO TABS: 4.0000 mg | ORAL_TABLET | Freq: Four times a day (QID) | ORAL | Status: DC | PRN

## 2024-09-01 MED ORDER — LISINOPRIL-HYDROCHLOROTHIAZIDE 20-12.5 MG PO TABS: 1.0000 | ORAL_TABLET | Freq: Every day | ORAL | Status: DC

## 2024-09-01 MED ORDER — ENOXAPARIN SODIUM 40 MG/0.4ML IJ SOSY
40.0000 mg | PREFILLED_SYRINGE | INTRAMUSCULAR | Status: DC
  Administered 2024-09-01 – 2024-09-03 (×3): 40 mg via SUBCUTANEOUS
  Filled 2024-09-01 (×3): qty 0.4

## 2024-09-01 MED ORDER — ATORVASTATIN CALCIUM 20 MG PO TABS
20.0000 mg | ORAL_TABLET | Freq: Every day | ORAL | Status: DC
  Administered 2024-09-01 – 2024-09-04 (×4): 20 mg via ORAL
  Filled 2024-09-01: qty 1
  Filled 2024-09-01: qty 2
  Filled 2024-09-01 (×2): qty 1

## 2024-09-01 MED ORDER — GLUCOSE 4 G PO CHEW
CHEWABLE_TABLET | ORAL | Status: AC
  Administered 2024-09-01: 8 g via ORAL
  Filled 2024-09-01: qty 2

## 2024-09-01 MED ORDER — IOHEXOL 300 MG/ML  SOLN
100.0000 mL | Freq: Once | INTRAMUSCULAR | Status: AC | PRN
Start: 2024-09-01 — End: 2024-09-01
  Administered 2024-09-01: 100 mL via INTRAVENOUS

## 2024-09-01 MED ORDER — ONDANSETRON HCL 4 MG/2ML IJ SOLN: 4.0000 mg | Freq: Four times a day (QID) | INTRAMUSCULAR | Status: DC | PRN

## 2024-09-01 MED ORDER — PANTOPRAZOLE SODIUM 40 MG PO TBEC
40.0000 mg | DELAYED_RELEASE_TABLET | Freq: Two times a day (BID) | ORAL | Status: DC
  Administered 2024-09-01 – 2024-09-04 (×6): 40 mg via ORAL
  Filled 2024-09-01 (×6): qty 1

## 2024-09-01 MED ORDER — ACETAMINOPHEN 325 MG PO TABS: 650.0000 mg | ORAL_TABLET | Freq: Four times a day (QID) | ORAL | Status: DC | PRN

## 2024-09-01 MED ORDER — SODIUM CHLORIDE 0.9 % IV SOLN: 250.0000 mL | INTRAVENOUS | Status: AC | PRN

## 2024-09-01 MED ORDER — HYDROCHLOROTHIAZIDE 12.5 MG PO TABS
12.5000 mg | ORAL_TABLET | Freq: Every day | ORAL | Status: DC
  Administered 2024-09-01 – 2024-09-04 (×4): 12.5 mg via ORAL
  Filled 2024-09-01 (×4): qty 1

## 2024-09-01 MED ORDER — INSULIN ASPART 100 UNIT/ML IJ SOLN
0.0000 [IU] | Freq: Three times a day (TID) | INTRAMUSCULAR | Status: DC
  Administered 2024-09-02 – 2024-09-03 (×2): 1 [IU] via SUBCUTANEOUS
  Administered 2024-09-03: 3 [IU] via SUBCUTANEOUS
  Administered 2024-09-04: 2 [IU] via SUBCUTANEOUS
  Filled 2024-09-01: qty 0.06

## 2024-09-01 NOTE — ED Provider Notes (Signed)
 [place] EMERGENCY DEPARTMENT AT [facility] Provider Note   CSN: [id number] Arrival date & time: 09/01/24  1319     Patient presents with: Oral tingling  and Tinnitus   Diana Coleman is a 73 y.o. female.   HPI Patient presents for recurrent hypoglycemia.  Seen in the ER 2 days ago for same but has had similar symptoms of hypoglycemia.  Had her insulin  decreased from 35-30.  However still had more episodes of sugar dropping.  This morning with confused and had a sugar of 53.  Has had ringing in her right ear and some numbness in her tongue.  That still going somewhat but states severe.  Has lost about 10 pounds without trying.  Has had a decreased appetite.  Lives by herself but did have a friend visiting.    Prior to Admission medications   Medication Sig Start Date End Date Taking? Authorizing Provider  alendronate (FOSAMAX) 70 MG tablet Take 70 mg by mouth once a week. Every Thursday 10/01/23   [provider]  atorvastatin  (LIPITOR) 20 MG tablet Take 20 mg by mouth daily. 07/26/24   [provider]  Dulaglutide  (TRULICITY ) 4.5 MG/0.5ML SOAJ Inject 4.5 mg into the skin once a week. Every Thursday    [provider]  insulin  degludec (TRESIBA ) 100 UNIT/ML FlexTouch Pen Inject 30 Units into the skin daily. 08/30/24   Levander Houston, MD  JARDIANCE 25 MG TABS tablet Take 25 mg by mouth daily. 10/01/23   [provider]  lisinopril -hydrochlorothiazide  (ZESTORETIC ) 20-12.5 MG tablet Take 1 tablet by mouth daily. 05/12/23   [provider]  pantoprazole  (PROTONIX ) 40 MG tablet Take 40 mg by mouth daily. 08/03/23   [provider]    Allergies: Clindamycin/lincomycin and Codeine    Review of Systems  Updated Vital Signs Ht 5' 7 (1.702 m)   Wt 81.2 kg   SpO2 98%   BMI 28.04 kg/m   Physical Exam Vitals reviewed.  HENT:     Head: Normocephalic.  Cardiovascular:     Rate and Rhythm: Regular rhythm.  Pulmonary:      Breath sounds: No wheezing or rhonchi.  Abdominal:     Tenderness: There is no abdominal tenderness.  Musculoskeletal:     Cervical back: Neck supple.     Comments: Previous bilateral below-knee potation's.  Also amputations of some fingertips.  Skin:    General: Skin is warm.  Neurological:     Mental Status: She is alert and oriented to person, place, and time. Mental status is at baseline.     (all labs ordered are listed, but only abnormal results are displayed) Labs Reviewed  COMPREHENSIVE METABOLIC PANEL WITH GFR - Abnormal; Notable for the following components:      Result Value   Glucose, Bld 174 (*)    BUN 24 (*)    Total Protein 6.4 (*)    All other components within normal limits  LIPASE, BLOOD  CBC WITH DIFFERENTIAL/PLATELET  CBG MONITORING, ED    EKG: None  Radiology: CT HEAD WO CONTRAST ( ) Result Date: 09/01/2024 CLINICAL DATA:  Ringing in ears. EXAM: CT HEAD WITHOUT CONTRAST TECHNIQUE: Contiguous axial images were obtained from the base of the skull through the vertex without intravenous contrast. RADIATION DOSE REDUCTION: This exam was performed according to the departmental dose-optimization program which includes automated exposure control, adjustment of the mA and/or kV according to patient size and/or use of iterative reconstruction technique. COMPARISON:  May 02, 2024 FINDINGS:  Brain: There is generalized cerebral atrophy with widening of the extra-axial spaces and ventricular dilatation. There are areas of decreased attenuation within the white matter tracts of the supratentorial brain, consistent with microvascular disease changes. Vascular: No hyperdense vessel or unexpected calcification. Skull: Normal. Negative for fracture or focal lesion. Sinuses/Orbits: Moderate severity sphenoid sinus mucosal thickening is seen. Other: None. IMPRESSION: 1. Generalized cerebral atrophy and microvascular disease changes of the supratentorial brain. 2. Moderate severity  sphenoid sinus disease. Electronically Signed   By: Suzen Dials M.D.   On: 09/01/2024 16:30     Procedures   Medications Ordered in the ED  iohexol  (OMNIPAQUE ) 300 MG/ML solution 100 mL (100 mLs Intravenous Contrast Given 09/01/24 1602)                                    Medical Decision Making Amount and/or Complexity of Data Reviewed Labs: ordered. Radiology: ordered.  Risk Prescription drug management.   Patient with recurrent hypoglycemia.  Recently seen for same and had a  decrease in her insulin  but still having hypoglycemia episodes.  Appears to happen at night.  Reportedly did eat dinner last night.  However also has had unexplained weight loss.  Somewhat decreased appetite.  Reviewed CT scans from 4 months ago and found to have pancreatic lesion and also potentially thickness of the gastroesophageal junction.  Malignancy considered.  Will get CT scan of the chest abdomen pelvis to further evaluate.  Will also get head due to the neurologic symptoms of ringing in her ears and numbness in her tongue at times.  I think patient would benefit from admission to the hospital with recurrent episodes of hypoglycemia to the point she is getting confused.  Has had recent adjustment of her medications and still having episodes.  Care turned over to [facility]    Final diagnoses:  None    ED Discharge Orders     None          Patsey Lot, MD 09/01/24 [phone]

## 2024-09-01 NOTE — ED Provider Notes (Signed)
  Physical Exam  Ht 5' 7 (1.702 m)   Wt 81.2 kg   SpO2 98%   BMI 28.04 kg/m   Physical Exam Vitals and nursing note reviewed.  Constitutional:      General: She is not in acute distress.    Appearance: She is well-developed.  HENT:     Head: Normocephalic and atraumatic.  Eyes:     Conjunctiva/sclera: Conjunctivae normal.  Cardiovascular:     Rate and Rhythm: Normal rate and regular rhythm.     Heart sounds: No murmur heard. Pulmonary:     Effort: Pulmonary effort is normal. No respiratory distress.     Breath sounds: Normal breath sounds.  Abdominal:     Palpations: Abdomen is soft.     Tenderness: There is no abdominal tenderness.  Musculoskeletal:        General: No swelling.     Cervical back: Neck supple.  Skin:    General: Skin is warm and dry.     Capillary Refill: Capillary refill takes less than 2 seconds.  Neurological:     Mental Status: She is alert.  Psychiatric:        Mood and Affect: Mood normal.     Procedures  Procedures  ED Course / MDM    Medical Decision Making Amount and/or Complexity of Data Reviewed Labs: ordered. Radiology: ordered.  Risk Prescription drug management. Decision regarding hospitalization.    Patient presents because episode oral tingling and tinnitus.  Patient was seen in the ED 2 days ago for same and had symptoms similar with hypoglycemia.  Had insulin  decreased from 35-30 at that point time.  Continues to have ongoing drops in her blood sugar.  Wakes up confused.  Repeated blood sugars in the 50s.  Has had recent 10 pound weight loss.  Decreased appetite.  Recent CT scan did show possible pancreatic lesion as well as possible abnormality at the GE junction.  Pending CT imaging but likely needs admission due to ongoing hypoglycemia episodes.  CT head unremarkable.  Further characterization of fluid distended esophagus.   Patient needs observation given ongoing drop in glucose.  Start patient on D5 normal saline  infusion given the glucose of 62.  Able to tolerate p.o. and was given oral replacement of glucose with a glucose tablet as well.  Will continue to monitor.       Simon Lavonia SAILOR, MD 09/01/24 [phone]

## 2024-09-01 NOTE — ED Notes (Signed)
 2 orange juice given with glucose tablets

## 2024-09-01 NOTE — Plan of Care (Signed)
   Problem: Coping: Goal: Ability to adjust to condition or change in health will improve Outcome: Progressing   Problem: Fluid Volume: Goal: Ability to maintain a balanced intake and output will improve Outcome: Progressing

## 2024-09-01 NOTE — Hospital Course (Addendum)
 Diana Coleman is a 73 y.o. year old female with PMH of hyperlipidemia, diabetes on long-term insulin , hypertension presents to the ED with tingling sensation in her tongue, ringing in the ear in the setting of recurrent hypoglycemia despite decreasing her Tresiba  at home.  She is on Jardiance/Trulicity  and Tresiba -last A1c around 10 in June and her insulin  was increased

## 2024-09-01 NOTE — ED Triage Notes (Signed)
 Pt BIB ems from home, pt states having tingling sensation in the tongue and ringing in the ear. Pt states her insulin  was lowered from 40 to 30, but her blood sugar still decreases and she becomes hypoglycemic. A&Ox4, VS stable

## 2024-09-01 NOTE — H&P (Addendum)
 History and Physical    Diana Coleman FMW:[id number] DOB: Sep 12, 1951 DOA: 09/01/2024  PCP: Delores Rojelio Caldron, NP   Patient coming from:Home, lives alone, on baseline on prosthesis for b/l BKA.  Chief Complaint  Patient presents with   Oral tingling    Tinnitus     YEP:Diana Coleman is a 73 y.o. year old female with PMH of hyperlipidemia, diabetes on long-term insulin , hypertension presents to the ED with tingling sensation in her tongue, ringing in the ear and recurrent episodes.  She is on Jardiance/Trulicity  and Tresiba -last A1c around 10 in June and her insulin  was increased. PCP has cut down insulin  from 45 U to 40 u few days ago and now on 30 u since 1 day due to low blodo sugar.This morning she was confused and had a blood sugar of 53 and had ringing in her right ear some numbness in her tongue.Patient endorses losing 10 pounds unintentionally and having decreased appetite Patient denies any nausea vomiting abdominal pain fever chills focal weakness. She denies dysurea.  In the ED: Vitals started with stable oxygen saturation, BP in 160s. Labs-CMP fairly stable although blood sugar around 5 PM was 62 CBC normal CT head> generalized cerebral atrophy and microvascular changes, moderate severity sphenoid sinus disease. CT chest abdomen pelvis with contrast> no acute pathology, has few distended esophagus evaluate endoscopy recommended if not performed, cystic lesion of the pancreas as seen on previous CT, MRI recommended on prior CT, left renal nonobstructing stone, diverticulosis and aortic atherosclerosis. Given her unexplained weight loss, recurrent hypoglycemia, pancreatic lesion admission was requested Dextrose  drip d5ns is being initiated due to recurrent hypoglycemia.  Assessment and plan:  Type 1 diabetes mellitus on long-term insulin  with hypoglycemia: Having recurrent hypoglycemia in the setting of recent poor oral intake with weight loss and being on insulin .  Will hold off on  her insulin /Trulicity /Jardiance, check hemoglobin A1c, and just add sliding scale insulin , currently needing D5 will continue same with serial CBG check.  Recent weight loss unintentional Abnormal CT scan with distended esophagus, cystic lesion of the pancreas: Will increase PPI to BID. She may benefit with GI evaluation-consult G-known to Margarete Munda sent to Dr Saintclair.  Hyperlipidemia: Resume statin.  Hypertension: On lisinopril  HCTZ resume home meds.  B/L BKA-Old,uses Prosthesis.  DVT prophylaxis: enoxaparin  (LOVENOX ) injection 40 mg Start: 09/01/24 2200 Code Status:   Code Status: Full Code  Objective: Vitals last 24 hrs: Vitals:   09/01/24 1336 09/01/24 1342  SpO2: 98% 98%  Weight:  81.2 kg  Height:  5' 7 (1.702 m)   Physical Examination: General exam: alert awake, oriented, older than stated age. HEENT:Oral mucosa moist, Ear/Nose WNL grossly. Respiratory system: Bilaterally clear BS,no use of accessory muscle Cardiovascular system: S1 & S2 +, No JVD. Gastrointestinal system: Abdomen soft,NT,ND, BS+. Nervous System: Alert, awake, moving all extremities,and following commands. Extremities: extremities warm, b/l BKA old. Skin: No rashes,no icterus. MSK: Normal muscle bulk,tone, power.   Medications reviewed:  Scheduled Meds:  enoxaparin  (LOVENOX ) injection  40 mg Subcutaneous Q24H   [START ON 09/02/2024] insulin  aspart  0-6 Units Subcutaneous TID WC   pantoprazole   40 mg Oral BID   sodium chloride  flush  3 mL Intravenous Q12H   Continuous Infusions:  sodium chloride      dextrose  5 % and 0.9 % NaCl 100 mL/hr at 09/01/24 1736   Diet: Diet Order             Diet NPO time specified  Diet effective midnight  Diet regular Room service appropriate? Yes; Fluid consistency: Thin  Diet effective now                     Severity of Illness: The appropriate patient status for this patient is OBSERVATION. Observation status is judged to be reasonable  and necessary in order to provide the required intensity of service to ensure the patient's safety. The patient's presenting symptoms, physical exam findings, and initial radiographic and laboratory data in the context of their medical condition is felt to place them at decreased risk for further clinical deterioration. Furthermore, it is anticipated that the patient will be medically stable for discharge from the hospital within 2 midnights of admission.   Family Communication: Admission, patients condition and plan of care including tests being ordered have been discussed with the patient who indicate understanding and agree with the plan and Code Status.  Consults called:  [facility].  Review of Systems: All systems were reviewed and were negative except as mentioned in HPI above. Negative for fever Negative for chest pain Negative for shortness of breath  Past Medical History:  Diagnosis Date   Cellulitis of foot, left 10/24/2014   hx/notes 10/24/2014   Charcot foot due to diabetes mellitus (HCC)    Hepatitis    Hep B    Hypertension    Osteomyelitis of right foot (HCC)    hx/notes 10/24/2014   Proliferative diabetic retinopathy (HCC)    right eye and vitreous hemorrhage   Type II diabetes mellitus (HCC)    Since 1998    Past Surgical History:  Procedure Laterality Date   ABOVE KNEE LEG AMPUTATION Right 2004   AMPUTATION Left 10/29/2014   Procedure: AMPUTATION BELOW KNEE - LEFT;  Surgeon: Jerona Harden GAILS, MD;  Location: [facility] OR;  Service: Orthopedics;  Laterality: Left;   AMPUTATION Right 01/05/2022   Procedure: AMPUTATION TIP OF RIGHT INDEX FINGER;  Surgeon: Josefina Chew, MD;  Location: [facility] OR;  Service: Orthopedics;  Laterality: Right;   CATARACT EXTRACTION W/ INTRAOCULAR LENS  IMPLANT, BILATERAL     FOOT SURGERY Left 1980's   ulcer removed   I & D EXTREMITY Right 10/04/2016   Procedure: IRRIGATION AND DEBRIDEMENT MIDDLE FINGER AND AMPUTATION;  Surgeon: Prentice Pagan, MD;   Location: [facility] OR;  Service: Orthopedics;  Laterality: Right;   I & D EXTREMITY Right 01/05/2022   Procedure: IRRIGATION AND DEBRIDEMENT RIGHT INDEX FINGER;  Surgeon: Josefina Chew, MD;  Location: [facility] OR;  Service: Orthopedics;  Laterality: Right;   MEMBRANE PEEL Right 02/01/2019   Procedure: LIN BOOTY;  Surgeon: Alvia Norleen BIRCH, MD;  Location: [facility] OR;  Service: Ophthalmology;  Laterality: Right;   PARS PLANA VITRECTOMY 27 GAUGE Right 02/01/2019   PARS PLANA VITRECTOMY 27 GAUGE Right 02/01/2019   Procedure: PARS PLANA VITRECTOMY 27 GAUGE, MEMBRANE PEEL, ENDOLASER, GAS INJECTION;  Surgeon: Alvia Norleen BIRCH, MD;  Location: [facility];  Service: Ophthalmology;  Laterality: Right;   REDUCTION MAMMAPLASTY Bilateral 1987   TOE AMPUTATION Left ~ 2011   top of my 3rd toe   TUBAL LIGATION  1973     reports that she has quit smoking. Her smoking use included cigarettes. She has a 15 pack-year smoking history. She has never used smokeless tobacco. She reports that she does not drink alcohol and does not use drugs.  Allergies  Allergen Reactions   Clindamycin/Lincomycin Itching   Codeine Itching    Family History  Problem Relation Age of Onset   Uterine  cancer Mother    Gout Father    Thyroid  disease Sister      Prior to Admission medications   Medication Sig Start Date End Date Taking? Authorizing Provider  alendronate (FOSAMAX) 70 MG tablet Take 70 mg by mouth once a week. Every Thursday 10/01/23   [provider]  atorvastatin  (LIPITOR) 20 MG tablet Take 20 mg by mouth daily. 07/26/24   [provider]  Dulaglutide  (TRULICITY ) 4.5 MG/0.5ML SOAJ Inject 4.5 mg into the skin once a week. Every Thursday    [provider]  insulin  degludec (TRESIBA ) 100 UNIT/ML FlexTouch Pen Inject 30 Units into the skin daily. 08/30/24   Levander Houston, MD  JARDIANCE 25 MG TABS tablet Take 25 mg by mouth daily. 10/01/23   [provider]  lisinopril -hydrochlorothiazide  (ZESTORETIC )  20-12.5 MG tablet Take 1 tablet by mouth daily. 05/12/23   [provider]  pantoprazole  (PROTONIX ) 40 MG tablet Take 40 mg by mouth daily. 08/03/23   [provider]   r   Labs on Admission: I have personally reviewed following labs and imaging studies  CBC: Recent Labs  Lab 08/30/24 0754 09/01/24 1332  WBC 5.8 6.9  NEUTROABS  --  4.4  HGB 12.7 12.3  HCT 40.9 40.0  MCV 90.7 90.1  PLT 280 290   Basic Metabolic Panel: Recent Labs  Lab 08/30/24 0754 09/01/24 1332  NA 140 142  K 3.6 4.4  CL 105 107  CO2 25 24  GLUCOSE 68* 174*  BUN 28* 24*  CREATININE 0.86 0.93  CALCIUM  8.4* 8.9   Estimated Creatinine Clearance: 57.3 mL/min (by C-G formula based on SCr of 0.93 mg/dL). Recent Labs  Lab 09/01/24 1332  AST 19  ALT 8  ALKPHOS 93  BILITOT 0.4  PROT 6.4*  ALBUMIN 3.5   Recent Labs  Lab 09/01/24 1332  LIPASE 26   No results for input(s): AMMONIA in the last 168 hours. Coagulation Profile: No results for input(s): INR, PROTIME in the last 168 hours. Cardiac Panel (last 3 results) No results for input(s): CKTOTAL, CKMB, TROPONINIHS, RELINDX in the last 72 hours.  BNP (last 3 results) No results for input(s): PROBNP in the last 8760 hours. HbA1C: No results for input(s): HGBA1C in the last 72 hours. CBG: Recent Labs  Lab 08/30/24 0749 08/30/24 0916 08/30/24 1100 09/01/24 1657  GLUCAP 88 83 144* 62*   Lipid Profile: No results for input(s): CHOL, HDL, LDLCALC, TRIG, CHOLHDL, LDLDIRECT in the last 72 hours. Thyroid  Function Tests: No results for input(s): TSH, T4TOTAL, FREET4, T3FREE, THYROIDAB in the last 72 hours. Urine analysis:    Component Value Date/Time   COLORURINE YELLOW 07/31/2015 0854   APPEARANCEUR CLEAR 07/31/2015 0854   LABSPEC 1.022 07/31/2015 0854   PHURINE 5.0 07/31/2015 0854   GLUCOSEU NEGATIVE 07/31/2015 0854   HGBUR SMALL (A) 07/31/2015 0854   BILIRUBINUR NEGATIVE 07/31/2015  0854   KETONESUR NEGATIVE 07/31/2015 0854   PROTEINUR NEGATIVE 07/31/2015 0854   UROBILINOGEN 1.0 07/31/2015 0854   NITRITE NEGATIVE 07/31/2015 0854   LEUKOCYTESUR SMALL (A) 07/31/2015 0854    Radiological Exams on Admission: CT CHEST ABDOMEN PELVIS W CONTRAST Result Date: 09/01/2024 CLINICAL DATA:  Unintentional weight loss. EXAM: CT CHEST, ABDOMEN, AND PELVIS WITH CONTRAST TECHNIQUE: Multidetector CT imaging of the chest, abdomen and pelvis was performed following the standard protocol during bolus administration of intravenous contrast. RADIATION DOSE REDUCTION: This exam was performed according to the departmental dose-optimization program which includes automated exposure control, adjustment of  the mA and/or kV according to patient size and/or use of iterative reconstruction technique. CONTRAST:  OMNIPAQUE  IOHEXOL  300 MG/ML  SOLN COMPARISON:  CT dated 05/02/2024. FINDINGS: CT CHEST FINDINGS Cardiovascular: There is no cardiomegaly or pericardial effusion. There is 3 vessel coronary vascular calcification. The thoracic aorta is unremarkable. The origins of the great vessels of the aortic arch and the central pulmonary arteries appear patent. Mediastinum/Nodes: No hilar or mediastinal adenopathy. Similar appearance of fluid distended esophagus may be related to reflux or delayed clearance. Underlying stricture or obstructing lesion of the distal esophagus or GE junction is not excluded. Further evaluation with esophagram or endoscopy, if not previously performed, recommended. No mediastinal fluid collection. Lungs/Pleura: No focal consolidation, pleural effusion, pneumothorax. The central airways are patent. Musculoskeletal: Degenerative changes of the spine. No acute osseous pathology. CT ABDOMEN PELVIS FINDINGS No intra-abdominal free air or free fluid. Hepatobiliary: The liver is unremarkable. No biliary dilatation. The gallbladder is unremarkable. Pancreas: Cystic lesions of the uncinate  process and distal body of the pancreas measure up to 2.1 x 2.5 cm in the uncinate process as seen on the prior CT. These are not characterized by CT but may represent side branch IPMNs. Further evaluation with MRI without and with contrast if not previously performed recommended. No active inflammatory changes. Spleen: Normal in size without focal abnormality. Adrenals/Urinary Tract: The adrenal glands unremarkable. There is a 4 mm nonobstructing left renal mid to lower pole stone. No hydronephrosis. The right kidney is unremarkable. Layering excreted contrast noted in the renal pelvis bilaterally. The visualized ureters and urinary bladder appear unremarkable. Stomach/Bowel: There is mild colonic diverticulosis. Loose stool noted in the rectum. There is no bowel obstruction or active inflammation. The appendix is normal. Vascular/Lymphatic: Mild aortoiliac atherosclerotic disease. The IVC is unremarkable. No portal venous gas. There is no adenopathy. Reproductive: Calcified uterine fibroid. No suspicious adnexal masses. Other: None Musculoskeletal: Degenerative changes of the spine. No acute osseous pathology. IMPRESSION: 1. No acute intrathoracic, abdominal, or pelvic pathology. 2. Similar appearance of fluid distended esophagus. Further evaluation with endoscopy, if not previously performed, recommended. 3. Cystic lesions of the pancreas as seen on the prior CT. MRI was recommended on prior CT. 4. A 4 mm nonobstructing left renal mid to lower pole stone. No hydronephrosis. 5. Mild colonic diverticulosis. No bowel obstruction. Normal appendix. 6.  Aortic Atherosclerosis (ICD10-I70.0). Electronically Signed   By: Vanetta Chou M.D.   On: 09/01/2024 16:40   CT HEAD WO CONTRAST ( ) Result Date: 09/01/2024 CLINICAL DATA:  Ringing in ears. EXAM: CT HEAD WITHOUT CONTRAST TECHNIQUE: Contiguous axial images were obtained from the base of the skull through the vertex without intravenous contrast. RADIATION DOSE  REDUCTION: This exam was performed according to the departmental dose-optimization program which includes automated exposure control, adjustment of the mA and/or kV according to patient size and/or use of iterative reconstruction technique. COMPARISON:  May 02, 2024 FINDINGS: Brain: There is generalized cerebral atrophy with widening of the extra-axial spaces and ventricular dilatation. There are areas of decreased attenuation within the white matter tracts of the supratentorial brain, consistent with microvascular disease changes. Vascular: No hyperdense vessel or unexpected calcification. Skull: Normal. Negative for fracture or focal lesion. Sinuses/Orbits: Moderate severity sphenoid sinus mucosal thickening is seen. Other: None. IMPRESSION: 1. Generalized cerebral atrophy and microvascular disease changes of the supratentorial brain. 2. Moderate severity sphenoid sinus disease. Electronically Signed   By: Suzen Dials M.D.   On: 09/01/2024 16:30   Mennie LAMY MD [facility]  If 7PM-7AM, please contact night-coverage [email or web address]  09/01/2024, 5:49 PM

## 2024-09-02 ENCOUNTER — Encounter (HOSPITAL_COMMUNITY): Payer: Self-pay | Admitting: Internal Medicine

## 2024-09-02 ENCOUNTER — Encounter (HOSPITAL_COMMUNITY)
Admission: EM | Disposition: A | Discharge status: Home / Self Care | Payer: Self-pay | Source: Home / Self Care | Attending: Family Medicine

## 2024-09-02 ENCOUNTER — Observation Stay (HOSPITAL_COMMUNITY): Admitting: Anesthesiology

## 2024-09-02 DIAGNOSIS — E1151 Type 2 diabetes mellitus with diabetic peripheral angiopathy without gangrene: Secondary | ICD-10-CM | POA: Diagnosis not present

## 2024-09-02 DIAGNOSIS — R1310 Dysphagia, unspecified: Secondary | ICD-10-CM | POA: Diagnosis not present

## 2024-09-02 DIAGNOSIS — Z79899 Other long term (current) drug therapy: Secondary | ICD-10-CM | POA: Diagnosis not present

## 2024-09-02 DIAGNOSIS — Z885 Allergy status to narcotic agent status: Secondary | ICD-10-CM | POA: Diagnosis not present

## 2024-09-02 DIAGNOSIS — I10 Essential (primary) hypertension: Secondary | ICD-10-CM | POA: Diagnosis present

## 2024-09-02 DIAGNOSIS — Z89511 Acquired absence of right leg below knee: Secondary | ICD-10-CM

## 2024-09-02 DIAGNOSIS — Q399 Congenital malformation of esophagus, unspecified: Secondary | ICD-10-CM | POA: Diagnosis not present

## 2024-09-02 DIAGNOSIS — E10649 Type 1 diabetes mellitus with hypoglycemia without coma: Secondary | ICD-10-CM | POA: Diagnosis present

## 2024-09-02 DIAGNOSIS — H9311 Tinnitus, right ear: Secondary | ICD-10-CM | POA: Diagnosis present

## 2024-09-02 DIAGNOSIS — Z89611 Acquired absence of right leg above knee: Secondary | ICD-10-CM | POA: Diagnosis not present

## 2024-09-02 DIAGNOSIS — E785 Hyperlipidemia, unspecified: Secondary | ICD-10-CM | POA: Diagnosis present

## 2024-09-02 DIAGNOSIS — Z7985 Long-term (current) use of injectable non-insulin antidiabetic drugs: Secondary | ICD-10-CM | POA: Diagnosis not present

## 2024-09-02 DIAGNOSIS — R200 Anesthesia of skin: Secondary | ICD-10-CM | POA: Diagnosis present

## 2024-09-02 DIAGNOSIS — K862 Cyst of pancreas: Secondary | ICD-10-CM | POA: Diagnosis present

## 2024-09-02 DIAGNOSIS — K869 Disease of pancreas, unspecified: Secondary | ICD-10-CM

## 2024-09-02 DIAGNOSIS — Z87891 Personal history of nicotine dependence: Secondary | ICD-10-CM | POA: Diagnosis not present

## 2024-09-02 DIAGNOSIS — Z89512 Acquired absence of left leg below knee: Secondary | ICD-10-CM | POA: Diagnosis not present

## 2024-09-02 DIAGNOSIS — Z7984 Long term (current) use of oral hypoglycemic drugs: Secondary | ICD-10-CM | POA: Diagnosis not present

## 2024-09-02 DIAGNOSIS — F17210 Nicotine dependence, cigarettes, uncomplicated: Secondary | ICD-10-CM | POA: Diagnosis present

## 2024-09-02 DIAGNOSIS — Z794 Long term (current) use of insulin: Secondary | ICD-10-CM | POA: Diagnosis not present

## 2024-09-02 DIAGNOSIS — I700 Atherosclerosis of aorta: Secondary | ICD-10-CM | POA: Diagnosis present

## 2024-09-02 DIAGNOSIS — R202 Paresthesia of skin: Secondary | ICD-10-CM | POA: Diagnosis present

## 2024-09-02 DIAGNOSIS — Z7983 Long term (current) use of bisphosphonates: Secondary | ICD-10-CM | POA: Diagnosis not present

## 2024-09-02 DIAGNOSIS — K222 Esophageal obstruction: Secondary | ICD-10-CM | POA: Diagnosis present

## 2024-09-02 DIAGNOSIS — Z881 Allergy status to other antibiotic agents status: Secondary | ICD-10-CM | POA: Diagnosis not present

## 2024-09-02 DIAGNOSIS — K219 Gastro-esophageal reflux disease without esophagitis: Secondary | ICD-10-CM | POA: Diagnosis present

## 2024-09-02 DIAGNOSIS — K5730 Diverticulosis of large intestine without perforation or abscess without bleeding: Secondary | ICD-10-CM | POA: Diagnosis present

## 2024-09-02 HISTORY — PX: ESOPHAGOGASTRODUODENOSCOPY: SHX5428

## 2024-09-02 LAB — COMPREHENSIVE METABOLIC PANEL WITH GFR
ALT: <5 U/L (ref 0–44)
AST: 12 U/L — ABNORMAL LOW (ref 15–41)
Albumin: 3.3 g/dL — ABNORMAL LOW (ref 3.5–5.0)
Alkaline Phosphatase: 83 U/L (ref 38–126)
Anion gap: 10 (ref 5–15)
BUN: 19 mg/dL (ref 8–23)
CO2: 25 mmol/L (ref 22–32)
Calcium: 8.7 mg/dL — ABNORMAL LOW (ref 8.9–10.3)
Chloride: 108 mmol/L (ref 98–111)
Creatinine, Ser: 0.78 mg/dL (ref 0.44–1.00)
GFR, Estimated: >60 mL/min (ref >60)
Glucose, Bld: 74 mg/dL (ref 70–99)
Potassium: 3.4 mmol/L — ABNORMAL LOW (ref 3.5–5.1)
Sodium: 143 mmol/L (ref 135–145)
Total Bilirubin: 0.3 mg/dL (ref 0.0–1.2)
Total Protein: 5.9 g/dL — ABNORMAL LOW (ref 6.5–8.1)

## 2024-09-02 LAB — GLUCOSE, CAPILLARY
Glucose-Capillary: 103 mg/dL — ABNORMAL HIGH (ref 70–99)
Glucose-Capillary: 104 mg/dL — ABNORMAL HIGH (ref 70–99)
Glucose-Capillary: 130 mg/dL — ABNORMAL HIGH (ref 70–99)
Glucose-Capillary: 159 mg/dL — ABNORMAL HIGH (ref 70–99)
Glucose-Capillary: 164 mg/dL — ABNORMAL HIGH (ref 70–99)
Glucose-Capillary: 166 mg/dL — ABNORMAL HIGH (ref 70–99)
Glucose-Capillary: 59 mg/dL — ABNORMAL LOW (ref 70–99)
Glucose-Capillary: 63 mg/dL — ABNORMAL LOW (ref 70–99)
Glucose-Capillary: 73 mg/dL (ref 70–99)
Glucose-Capillary: 77 mg/dL (ref 70–99)
Glucose-Capillary: 79 mg/dL (ref 70–99)

## 2024-09-02 LAB — CBC
HCT: 36.8 % (ref 36.0–46.0)
Hemoglobin: 11.3 g/dL — ABNORMAL LOW (ref 12.0–15.0)
MCH: 27.6 pg (ref 26.0–34.0)
MCHC: 30.7 g/dL (ref 30.0–36.0)
MCV: 89.8 fL (ref 80.0–100.0)
Platelets: 261 K/uL (ref 150–400)
RBC: 4.1 MIL/uL (ref 3.87–5.11)
RDW: 13.6 % (ref 11.5–15.5)
WBC: 5.8 K/uL (ref 4.0–10.5)
nRBC: 0 % (ref 0.0–0.2)

## 2024-09-02 LAB — HEMOGLOBIN A1C
Hgb A1c MFr Bld: 9.1 % — ABNORMAL HIGH (ref 4.8–5.6)
Mean Plasma Glucose: 214.47 mg/dL

## 2024-09-02 SURGERY — EGD (ESOPHAGOGASTRODUODENOSCOPY)
Anesthesia: Monitor Anesthesia Care

## 2024-09-02 MED ORDER — DEXTROSE 50 % IV SOLN
INTRAVENOUS | Status: AC
  Filled 2024-09-02: qty 50

## 2024-09-02 MED ORDER — LACTATED RINGERS IV SOLN: INTRAVENOUS | Status: DC

## 2024-09-02 MED ORDER — LIDOCAINE HCL (CARDIAC) PF 100 MG/5ML IV SOSY
PREFILLED_SYRINGE | INTRAVENOUS | Status: DC | PRN
Start: 2024-09-02 — End: 2024-09-02
  Administered 2024-09-02: 60 mg via INTRAVENOUS

## 2024-09-02 MED ORDER — DEXTROSE 50 % IV SOLN
12.5000 g | INTRAVENOUS | Status: AC
  Administered 2024-09-02: 12.5 g via INTRAVENOUS

## 2024-09-02 MED ORDER — PANTOPRAZOLE SODIUM 40 MG PO TBEC: 40.0000 mg | DELAYED_RELEASE_TABLET | Freq: Two times a day (BID) | ORAL | Status: DC

## 2024-09-02 MED ORDER — DEXTROSE-SODIUM CHLORIDE 5-0.9 % IV SOLN: INTRAVENOUS | Status: DC

## 2024-09-02 MED ORDER — ALUM & MAG HYDROXIDE-SIMETH 200-200-20 MG/5ML PO SUSP
30.0000 mL | ORAL | Status: DC | PRN
  Administered 2024-09-02: 30 mL via ORAL
  Filled 2024-09-02: qty 30

## 2024-09-02 MED ORDER — PROPOFOL 500 MG/50ML IV EMUL
INTRAVENOUS | Status: DC | PRN
  Administered 2024-09-02: 40 mg via INTRAVENOUS
  Administered 2024-09-02: 135 ug/kg/min via INTRAVENOUS

## 2024-09-02 NOTE — Interval H&P Note (Signed)
 History and Physical Interval Note: 73/female with dysphagia and abnormal CAT scan for EGD with possible balloon dilation with propofol .  09/02/2024 11:22 AM  [name]  has presented today for EGD with possible balloon dilation with propofol , with the diagnosis of esophagitis, abnormal CT scan and dysphagia.  The various methods of treatment have been discussed with the patient and family. After consideration of risks, benefits and other options for treatment, the patient has consented to  Procedure(s): EGD (ESOPHAGOGASTRODUODENOSCOPY) (N/A) as a surgical intervention.  The patient's history has been reviewed, patient examined, no change in status, stable for surgery.  I have reviewed the patient's chart and labs.  Questions were answered to the patient's satisfaction.     Diana Coleman

## 2024-09-02 NOTE — H&P (View-Only) (Signed)
 [facility] Gastroenterology Consult  Referring Provider: [facility] hospitalist/Dr. CHRISTOBAL Primary Care Physician:  Delores Rojelio Caldron, NP Primary Gastroenterologist: Dr. Rosalie  Reason for Consultation: Abnormal CAT scan  HPI: Diana Coleman is a 73 y.o. female was admitted on 09/01/2024 with symptoms of hypoglycemia.  Patient states she has been to the ER at least 3 times with hypoglycemia but was admitted this time with low blood sugar and symptoms of confusion, tinnitus, numbness of her tongue.   Patient is on pantoprazole  once a day, 40 mg, for acid reflux with good control of symptoms, however states she has intermittent, nonprogressive difficulty swallowing with solids as well as liquids.  On multiple occasions, she has regurgitated liquids as well as solids and is very careful chewing certain food which can get hung in her lower chest.  She also states she has lost about 10 pounds unintentionally and has a decreased appetite.  She denies any changes in bowel habits, denies noticing blood in stool or black stools.  In ER she was found to have abnormal CAT scan with distended esophagus and endoscopy was recommended.  Previous GI workup: Colonoscopy 07/2015, Dr. Rosalie, screening: 2 tubular adenoma removed from descending, 2 tubular adenoma removed from transverse, internal/external hemorrhoids, diverticulosis in sigmoid, descending and transverse  Past Medical History:  Diagnosis Date   Cellulitis of foot, left 10/24/2014   hx/notes 10/24/2014   Charcot foot due to diabetes mellitus (HCC)    Hepatitis    Hep B    Hypertension    Osteomyelitis of right foot (HCC)    hx/notes 10/24/2014   Proliferative diabetic retinopathy (HCC)    right eye and vitreous hemorrhage   Type II diabetes mellitus (HCC)    Since 1998    Past Surgical History:  Procedure Laterality Date   ABOVE KNEE LEG AMPUTATION Right 2004   AMPUTATION Left 10/29/2014   Procedure: AMPUTATION BELOW KNEE - LEFT;  Surgeon: Jerona Harden GAILS, MD;  Location: [facility] OR;  Service: Orthopedics;  Laterality: Left;   AMPUTATION Right 01/05/2022   Procedure: AMPUTATION TIP OF RIGHT INDEX FINGER;  Surgeon: Josefina Chew, MD;  Location: [facility] OR;  Service: Orthopedics;  Laterality: Right;   CATARACT EXTRACTION W/ INTRAOCULAR LENS  IMPLANT, BILATERAL     FOOT SURGERY Left 1980's   ulcer removed   I & D EXTREMITY Right 10/04/2016   Procedure: IRRIGATION AND DEBRIDEMENT MIDDLE FINGER AND AMPUTATION;  Surgeon: Prentice Pagan, MD;  Location: [facility] OR;  Service: Orthopedics;  Laterality: Right;   I & D EXTREMITY Right 01/05/2022   Procedure: IRRIGATION AND DEBRIDEMENT RIGHT INDEX FINGER;  Surgeon: Josefina Chew, MD;  Location: [facility] OR;  Service: Orthopedics;  Laterality: Right;   MEMBRANE PEEL Right 02/01/2019   Procedure: LIN BOOTY;  Surgeon: Alvia Norleen BIRCH, MD;  Location: [facility] OR;  Service: Ophthalmology;  Laterality: Right;   PARS PLANA VITRECTOMY 27 GAUGE Right 02/01/2019   PARS PLANA VITRECTOMY 27 GAUGE Right 02/01/2019   Procedure: PARS PLANA VITRECTOMY 27 GAUGE, MEMBRANE PEEL, ENDOLASER, GAS INJECTION;  Surgeon: Alvia Norleen BIRCH, MD;  Location: [facility] OR;  Service: Ophthalmology;  Laterality: Right;   REDUCTION MAMMAPLASTY Bilateral 1987   TOE AMPUTATION Left ~ 2011   top of my 3rd toe   TUBAL LIGATION  1973    Prior to Admission medications   Medication Sig Start Date End Date Taking? Authorizing Provider  alendronate (FOSAMAX) 70 MG tablet Take 70 mg by mouth once a week. Every Thursday 10/01/23   [provider]  atorvastatin  (LIPITOR) 20 MG tablet Take 20 mg by mouth daily. 07/26/24   [provider]  Dulaglutide  (TRULICITY ) 4.5 MG/0.5ML SOAJ Inject 4.5 mg into the skin once a week. Every Thursday    [provider]  insulin  degludec (TRESIBA ) 100 UNIT/ML FlexTouch Pen Inject 30 Units into the skin daily. 08/30/24   Levander Houston, MD  JARDIANCE 25 MG TABS tablet Take 25 mg by mouth daily. 10/01/23   [provider]  lisinopril -hydrochlorothiazide  (ZESTORETIC ) 20-12.5 MG tablet Take 1 tablet by mouth daily. 05/12/23   [provider]  pantoprazole  (PROTONIX ) 40 MG tablet Take 40 mg by mouth daily. 08/03/23   [provider]    Current Facility-Administered Medications  Medication Dose Route Frequency Provider Last Rate Last Admin   0.9 %  sodium chloride  infusion  250 mL Intravenous PRN Simon Lavonia SAILOR, MD       acetaminophen  (TYLENOL ) tablet 650 mg  650 mg Oral Q6H PRN Kc, Ramesh, MD       Or   acetaminophen  (TYLENOL ) suppository 650 mg  650 mg Rectal Q6H PRN Kc, Ramesh, MD       atorvastatin  (LIPITOR) tablet 20 mg  20 mg Oral Daily Kc, Ramesh, MD   20 mg at 09/01/24 2003   enoxaparin  (LOVENOX ) injection 40 mg  40 mg Subcutaneous Q24H Kc, Ramesh, MD   40 mg at 09/01/24 2106   lisinopril  (ZESTRIL ) tablet 20 mg  20 mg Oral Daily Kc, Ramesh, MD   20 mg at 09/01/24 2003   And   hydrochlorothiazide  (HYDRODIURIL ) tablet 12.5 mg  12.5 mg Oral Daily Kc, Ramesh, MD   12.5 mg at 09/01/24 2003   insulin  aspart (novoLOG ) injection 0-6 Units  0-6 Units Subcutaneous TID WC Christobal Guadalajara, MD       lactated ringers  infusion   Intravenous Continuous Chavez, Abigail, NP 100 mL/hr at 09/02/24 0046 New Bag at 09/02/24 0046   ondansetron  (ZOFRAN ) tablet 4 mg  4 mg Oral Q6H PRN Christobal Guadalajara, MD       Or   ondansetron  (ZOFRAN ) injection 4 mg  4 mg Intravenous Q6H PRN Kc, Ramesh, MD       pantoprazole  (PROTONIX ) EC tablet 40 mg  40 mg Oral BID Kc, Ramesh, MD   40 mg at 09/01/24 2106   sodium chloride  flush (NS) 0.9 % injection 3 mL  3 mL Intravenous Q12H Simon Lavonia SAILOR, MD       sodium chloride  flush (NS) 0.9 % injection 3 mL  3 mL Intravenous PRN Simon Lavonia SAILOR, MD        Allergies as of 09/01/2024 - Review Complete 09/01/2024  Allergen Reaction Noted   Clindamycin/lincomycin Itching 10/24/2014   Codeine Itching 10/24/2014    Family History  Problem Relation Age of Onset   Uterine cancer  Mother    Gout Father    Thyroid  disease Sister     Social History   Socioeconomic History   Marital status: Divorced    Spouse name: Not on file   Number of children: Not on file   Years of education: Not on file   Highest education level: Not on file  Occupational History   Not on file  Tobacco Use   Smoking status: Former    Current packs/day: 0.50    Average packs/day: 0.5 packs/day for 30.0 years (15.0 ttl pk-yrs)    Types: Cigarettes   Smokeless tobacco: Never   Tobacco comments:    quit smoking ~  2000  Vaping Use   Vaping status: Never Used  Substance and Sexual Activity   Alcohol use: No    Alcohol/week: 0.0 standard drinks of alcohol   Drug use: No   Sexual activity: Never  Other Topics Concern   Not on file  Social History Narrative   Divorced, 5 children.  Keeps a great grand 2 days out of the week.  He is [age].     Social Drivers of Corporate investment banker Strain: Not on file  Food Insecurity: No Food Insecurity (09/01/2024)   Hunger Vital Sign    Worried About Running Out of Food in the Last Year: Never true    Ran Out of Food in the Last Year: Never true  Transportation Needs: No Transportation Needs (09/01/2024)   PRAPARE - Administrator, Civil Service (Medical): No    Lack of Transportation (Non-Medical): No  Physical Activity: Not on file  Stress: Not on file  Social Connections: Unknown (09/01/2024)   Social Connection and Isolation Panel    Frequency of Communication with Friends and Family: More than three times a week    Frequency of Social Gatherings with Friends and Family: Three times a week    Attends Religious Services: Patient declined    Active Member of Clubs or Organizations: Patient declined    Attends Banker Meetings: Patient declined    Marital Status: Patient declined  Intimate Partner Violence: Not At Risk (09/01/2024)   Humiliation, Afraid, Rape, and Kick questionnaire    Fear of Current or  Ex-Partner: No    Emotionally Abused: No    Physically Abused: No    Sexually Abused: No    Review of Systems: As per HPI Physical Exam: Vital signs in last 24 hours: Temp:  [98.1 F (36.7 C)-98.3 F (36.8 C)] 98.2 F (36.8 C) (09/12 0027) Pulse Rate:  [65-72] 72 (09/12 0027) Resp:  [15-18] 15 (09/12 0027) BP: (116-170)/(69-97) 116/72 (09/12 0027) SpO2:  [96 %-100 %] 98 % (09/12 0027) Weight:  [81.2 kg] 81.2 kg (09/11 1342) Last BM Date :  (PTA)  General:   Alert,  Well-developed, well-nourished, pleasant and cooperative in NAD Head:  Normocephalic and atraumatic. Eyes:  Sclera clear, no icterus.   Conjunctiva pink. Ears:  Normal auditory acuity. Nose:  No deformity, discharge,  or lesions. Mouth:  No deformity or lesions.  Oropharynx pink & moist. Neck:  Supple; no masses or thyromegaly. Lungs:  Clear throughout to auscultation.   No wheezes, crackles, or rhonchi. No acute distress. Heart:  Regular rate and rhythm; no murmurs, clicks, rubs,  or gallops. Extremities:  Without clubbing or edema. Neurologic:  Alert and  oriented x4;  grossly normal neurologically. Skin:  Intact without significant lesions or rashes. Psych:  Alert and cooperative. Normal mood and affect. Abdomen:  Soft, nontender and nondistended. No masses, hepatosplenomegaly or hernias noted. Normal bowel sounds, without guarding, and without rebound.         Lab Results: Recent Labs    08/30/24 0754 09/01/24 1332  WBC 5.8 6.9  HGB 12.7 12.3  HCT 40.9 40.0  PLT 280 290   BMET Recent Labs    08/30/24 0754 09/01/24 1332  NA 140 142  K 3.6 4.4  CL 105 107  CO2 25 24  GLUCOSE 68* 174*  BUN 28* 24*  CREATININE 0.86 0.93  CALCIUM  8.4* 8.9   LFT Recent Labs    09/01/24 1332  PROT 6.4*  ALBUMIN 3.5  AST 19  ALT 8  ALKPHOS 93  BILITOT 0.4   PT/INR No results for input(s): LABPROT, INR in the last 72 hours.  Studies/Results: CT CHEST ABDOMEN PELVIS W CONTRAST Result Date:  09/01/2024 CLINICAL DATA:  Unintentional weight loss. EXAM: CT CHEST, ABDOMEN, AND PELVIS WITH CONTRAST TECHNIQUE: Multidetector CT imaging of the chest, abdomen and pelvis was performed following the standard protocol during bolus administration of intravenous contrast. RADIATION DOSE REDUCTION: This exam was performed according to the departmental dose-optimization program which includes automated exposure control, adjustment of the mA and/or kV according to patient size and/or use of iterative reconstruction technique. CONTRAST:  OMNIPAQUE  IOHEXOL  300 MG/ML  SOLN COMPARISON:  CT dated 05/02/2024. FINDINGS: CT CHEST FINDINGS Cardiovascular: There is no cardiomegaly or pericardial effusion. There is 3 vessel coronary vascular calcification. The thoracic aorta is unremarkable. The origins of the great vessels of the aortic arch and the central pulmonary arteries appear patent. Mediastinum/Nodes: No hilar or mediastinal adenopathy. Similar appearance of fluid distended esophagus may be related to reflux or delayed clearance. Underlying stricture or obstructing lesion of the distal esophagus or GE junction is not excluded. Further evaluation with esophagram or endoscopy, if not previously performed, recommended. No mediastinal fluid collection. Lungs/Pleura: No focal consolidation, pleural effusion, pneumothorax. The central airways are patent. Musculoskeletal: Degenerative changes of the spine. No acute osseous pathology. CT ABDOMEN PELVIS FINDINGS No intra-abdominal free air or free fluid. Hepatobiliary: The liver is unremarkable. No biliary dilatation. The gallbladder is unremarkable. Pancreas: Cystic lesions of the uncinate process and distal body of the pancreas measure up to 2.1 x 2.5 cm in the uncinate process as seen on the prior CT. These are not characterized by CT but may represent side branch IPMNs. Further evaluation with MRI without and with contrast if not previously performed recommended. No  active inflammatory changes. Spleen: Normal in size without focal abnormality. Adrenals/Urinary Tract: The adrenal glands unremarkable. There is a 4 mm nonobstructing left renal mid to lower pole stone. No hydronephrosis. The right kidney is unremarkable. Layering excreted contrast noted in the renal pelvis bilaterally. The visualized ureters and urinary bladder appear unremarkable. Stomach/Bowel: There is mild colonic diverticulosis. Loose stool noted in the rectum. There is no bowel obstruction or active inflammation. The appendix is normal. Vascular/Lymphatic: Mild aortoiliac atherosclerotic disease. The IVC is unremarkable. No portal venous gas. There is no adenopathy. Reproductive: Calcified uterine fibroid. No suspicious adnexal masses. Other: None Musculoskeletal: Degenerative changes of the spine. No acute osseous pathology. IMPRESSION: 1. No acute intrathoracic, abdominal, or pelvic pathology. 2. Similar appearance of fluid distended esophagus. Further evaluation with endoscopy, if not previously performed, recommended. 3. Cystic lesions of the pancreas as seen on the prior CT. MRI was recommended on prior CT. 4. A 4 mm nonobstructing left renal mid to lower pole stone. No hydronephrosis. 5. Mild colonic diverticulosis. No bowel obstruction. Normal appendix. 6.  Aortic Atherosclerosis (ICD10-I70.0). Electronically Signed   By: Vanetta Chou M.D.   On: 09/01/2024 16:40   CT HEAD WO CONTRAST ( ) Result Date: 09/01/2024 CLINICAL DATA:  Ringing in ears. EXAM: CT HEAD WITHOUT CONTRAST TECHNIQUE: Contiguous axial images were obtained from the base of the skull through the vertex without intravenous contrast. RADIATION DOSE REDUCTION: This exam was performed according to the departmental dose-optimization program which includes automated exposure control, adjustment of the mA and/or kV according to patient size and/or use of iterative reconstruction technique. COMPARISON:  May 02, 2024 FINDINGS: Brain:  There is generalized cerebral atrophy with  widening of the extra-axial spaces and ventricular dilatation. There are areas of decreased attenuation within the white matter tracts of the supratentorial brain, consistent with microvascular disease changes. Vascular: No hyperdense vessel or unexpected calcification. Skull: Normal. Negative for fracture or focal lesion. Sinuses/Orbits: Moderate severity sphenoid sinus mucosal thickening is seen. Other: None. IMPRESSION: 1. Generalized cerebral atrophy and microvascular disease changes of the supratentorial brain. 2. Moderate severity sphenoid sinus disease. Electronically Signed   By: Suzen Dials M.D.   On: 09/01/2024 16:30    Impression: Dysphagia, both to solids and liquids, intermittent, nonprogressive but associated with 10 pound unintentional weight loss  Abnormal CAT scan: CT abdomen and pelvis with contrast:  Fluid distended esophagus may be related to reflux with delayed clearance, underlying stricture or obstructive lesion of distal esophagus or GE junction cannot be ruled out.  Further evaluation with esophagram or endoscopy recommended  Cystic lesion in uncinate process in distal body of pancreas measuring 2.1 x 2.5 cm, may represent sidebranch IPMN, further evaluation with MRI without and with contrast recommended  Recurrent hypoglycemia  Colonic diverticulosis Tubular adenoma removed in 2016, was due for surveillance colonoscopy in 2021  Lab abnormalities: Total protein 6.4, BUN elevated 24, elevated blood sugar 174  Plan: EGD today with possible balloon dilation and esophageal biopsies with propofol . The risks and the benefits of the procedure were discussed with the patient in details, she understands and verbalizes consent.  Recommend elective MRI with and without contrast of pancreas as CAT scan is concerning for sidebranch IPMN.  Recommend outpatient surveillance colonoscopy, had multiple tubular adenomas removed in  2016.   LOS: 0 days   Estelita Manas, MD  09/02/2024, 3:48 AM

## 2024-09-02 NOTE — Care Management Obs Status (Signed)
 MEDICARE OBSERVATION STATUS NOTIFICATION   Patient Details  Name: Diana Coleman MRN: [id number] Date of Birth: 1951-07-15   Medicare Observation Status Notification Given:  Yes    Bascom Service, RN 09/02/2024, 3:34 PM

## 2024-09-02 NOTE — Transfer of Care (Signed)
 Immediate Anesthesia Transfer of Care Note  Patient: Diana Coleman  Procedure(s) Performed: Procedure(s): EGD (ESOPHAGOGASTRODUODENOSCOPY) (N/A) Balloon dilation wire-guided  Patient Location: PACU  Anesthesia Type:MAC  Level of Consciousness:  sedated, patient cooperative and responds to stimulation  Airway & Oxygen Therapy:Patient Spontanous Breathing and Patient connected to face mask oxgen  Post-op Assessment:  Report given to PACU RN and Post -op Vital signs reviewed and stable  Post vital signs:  Reviewed and stable  Last Vitals:  Vitals:   09/02/24 1121 09/02/24 1315  BP: (!) 179/79 116/60  Pulse: 78 81  Resp: 15 (!) 24  Temp: (!) 36.4 C 36.5 C  SpO2: 96% 100%    Complications: No apparent anesthesia complications

## 2024-09-02 NOTE — Anesthesia Preprocedure Evaluation (Addendum)
 Anesthesia Evaluation  Patient identified by MRN, date of birth, ID band Patient awake    Reviewed: Allergy & Precautions, NPO status , Patient's Chart, lab work & pertinent test results  History of Anesthesia Complications Negative for: history of anesthetic complications  Airway Mallampati: I  TM Distance: >3 FB Neck ROM: Full    Dental  (+) Edentulous Upper, Missing, Dental Advisory Given   Pulmonary former smoker   breath sounds clear to auscultation       Cardiovascular hypertension, Pt. on medications (-) angina + Peripheral Vascular Disease   Rhythm:Irregular Rate:Normal     Neuro/Psych negative neurological ROS     GI/Hepatic Neg liver ROS,GERD  Medicated and Controlled,,  Endo/Other  diabetes (glu 104), Insulin  Dependent    Renal/GU      Musculoskeletal   Abdominal   Peds  Hematology Hb 11.3, plt 261   Anesthesia Other Findings   Reproductive/Obstetrics                              Anesthesia Physical Anesthesia Plan  ASA: 3  Anesthesia Plan: MAC   Post-op Pain Management: Minimal or no pain anticipated   Induction:   PONV Risk Score and Plan: 2 and Treatment may vary due to age or medical condition  Airway Management Planned: Natural Airway and Simple Face Mask  Additional Equipment: None  Intra-op Plan:   Post-operative Plan:   Informed Consent: I have reviewed the patients History and Physical, chart, labs and discussed the procedure including the risks, benefits and alternatives for the proposed anesthesia with the patient or authorized representative who has indicated his/her understanding and acceptance.     Dental advisory given  Plan Discussed with: CRNA and Surgeon  Anesthesia Plan Comments:          Anesthesia Quick Evaluation

## 2024-09-02 NOTE — Consult Note (Signed)
 [facility] Gastroenterology Consult  Referring Provider: [facility] hospitalist/Dr. CHRISTOBAL Primary Care Physician:  Delores Rojelio Caldron, NP Primary Gastroenterologist: Dr. Rosalie  Reason for Consultation: Abnormal CAT scan  HPI: Diana Coleman is a 73 y.o. female was admitted on 09/01/2024 with symptoms of hypoglycemia.  Patient states she has been to the ER at least 3 times with hypoglycemia but was admitted this time with low blood sugar and symptoms of confusion, tinnitus, numbness of her tongue.   Patient is on pantoprazole  once a day, 40 mg, for acid reflux with good control of symptoms, however states she has intermittent, nonprogressive difficulty swallowing with solids as well as liquids.  On multiple occasions, she has regurgitated liquids as well as solids and is very careful chewing certain food which can get hung in her lower chest.  She also states she has lost about 10 pounds unintentionally and has a decreased appetite.  She denies any changes in bowel habits, denies noticing blood in stool or black stools.  In ER she was found to have abnormal CAT scan with distended esophagus and endoscopy was recommended.  Previous GI workup: Colonoscopy 07/2015, Dr. Rosalie, screening: 2 tubular adenoma removed from descending, 2 tubular adenoma removed from transverse, internal/external hemorrhoids, diverticulosis in sigmoid, descending and transverse  Past Medical History:  Diagnosis Date   Cellulitis of foot, left 10/24/2014   hx/notes 10/24/2014   Charcot foot due to diabetes mellitus (HCC)    Hepatitis    Hep B    Hypertension    Osteomyelitis of right foot (HCC)    hx/notes 10/24/2014   Proliferative diabetic retinopathy (HCC)    right eye and vitreous hemorrhage   Type II diabetes mellitus (HCC)    Since 1998    Past Surgical History:  Procedure Laterality Date   ABOVE KNEE LEG AMPUTATION Right 2004   AMPUTATION Left 10/29/2014   Procedure: AMPUTATION BELOW KNEE - LEFT;  Surgeon: Jerona Harden GAILS, MD;  Location: [facility] OR;  Service: Orthopedics;  Laterality: Left;   AMPUTATION Right 01/05/2022   Procedure: AMPUTATION TIP OF RIGHT INDEX FINGER;  Surgeon: Josefina Chew, MD;  Location: [facility] OR;  Service: Orthopedics;  Laterality: Right;   CATARACT EXTRACTION W/ INTRAOCULAR LENS  IMPLANT, BILATERAL     FOOT SURGERY Left 1980's   ulcer removed   I & D EXTREMITY Right 10/04/2016   Procedure: IRRIGATION AND DEBRIDEMENT MIDDLE FINGER AND AMPUTATION;  Surgeon: Prentice Pagan, MD;  Location: [facility] OR;  Service: Orthopedics;  Laterality: Right;   I & D EXTREMITY Right 01/05/2022   Procedure: IRRIGATION AND DEBRIDEMENT RIGHT INDEX FINGER;  Surgeon: Josefina Chew, MD;  Location: [facility] OR;  Service: Orthopedics;  Laterality: Right;   MEMBRANE PEEL Right 02/01/2019   Procedure: LIN BOOTY;  Surgeon: Alvia Norleen BIRCH, MD;  Location: [facility] OR;  Service: Ophthalmology;  Laterality: Right;   PARS PLANA VITRECTOMY 27 GAUGE Right 02/01/2019   PARS PLANA VITRECTOMY 27 GAUGE Right 02/01/2019   Procedure: PARS PLANA VITRECTOMY 27 GAUGE, MEMBRANE PEEL, ENDOLASER, GAS INJECTION;  Surgeon: Alvia Norleen BIRCH, MD;  Location: [facility] OR;  Service: Ophthalmology;  Laterality: Right;   REDUCTION MAMMAPLASTY Bilateral 1987   TOE AMPUTATION Left ~ 2011   top of my 3rd toe   TUBAL LIGATION  1973    Prior to Admission medications   Medication Sig Start Date End Date Taking? Authorizing Provider  alendronate (FOSAMAX) 70 MG tablet Take 70 mg by mouth once a week. Every Thursday 10/01/23   [provider]  atorvastatin  (LIPITOR) 20 MG tablet Take 20 mg by mouth daily. 07/26/24   [provider]  Dulaglutide  (TRULICITY ) 4.5 MG/0.5ML SOAJ Inject 4.5 mg into the skin once a week. Every Thursday    [provider]  insulin  degludec (TRESIBA ) 100 UNIT/ML FlexTouch Pen Inject 30 Units into the skin daily. 08/30/24   Levander Houston, MD  JARDIANCE 25 MG TABS tablet Take 25 mg by mouth daily. 10/01/23   [provider]  lisinopril -hydrochlorothiazide  (ZESTORETIC ) 20-12.5 MG tablet Take 1 tablet by mouth daily. 05/12/23   [provider]  pantoprazole  (PROTONIX ) 40 MG tablet Take 40 mg by mouth daily. 08/03/23   [provider]    Current Facility-Administered Medications  Medication Dose Route Frequency Provider Last Rate Last Admin   0.9 %  sodium chloride  infusion  250 mL Intravenous PRN Simon Lavonia SAILOR, MD       acetaminophen  (TYLENOL ) tablet 650 mg  650 mg Oral Q6H PRN Kc, Ramesh, MD       Or   acetaminophen  (TYLENOL ) suppository 650 mg  650 mg Rectal Q6H PRN Kc, Ramesh, MD       atorvastatin  (LIPITOR) tablet 20 mg  20 mg Oral Daily Kc, Ramesh, MD   20 mg at 09/01/24 2003   enoxaparin  (LOVENOX ) injection 40 mg  40 mg Subcutaneous Q24H Kc, Ramesh, MD   40 mg at 09/01/24 2106   lisinopril  (ZESTRIL ) tablet 20 mg  20 mg Oral Daily Kc, Ramesh, MD   20 mg at 09/01/24 2003   And   hydrochlorothiazide  (HYDRODIURIL ) tablet 12.5 mg  12.5 mg Oral Daily Kc, Ramesh, MD   12.5 mg at 09/01/24 2003   insulin  aspart (novoLOG ) injection 0-6 Units  0-6 Units Subcutaneous TID WC Christobal Guadalajara, MD       lactated ringers  infusion   Intravenous Continuous Chavez, Abigail, NP 100 mL/hr at 09/02/24 0046 New Bag at 09/02/24 0046   ondansetron  (ZOFRAN ) tablet 4 mg  4 mg Oral Q6H PRN Christobal Guadalajara, MD       Or   ondansetron  (ZOFRAN ) injection 4 mg  4 mg Intravenous Q6H PRN Kc, Ramesh, MD       pantoprazole  (PROTONIX ) EC tablet 40 mg  40 mg Oral BID Kc, Ramesh, MD   40 mg at 09/01/24 2106   sodium chloride  flush (NS) 0.9 % injection 3 mL  3 mL Intravenous Q12H Simon Lavonia SAILOR, MD       sodium chloride  flush (NS) 0.9 % injection 3 mL  3 mL Intravenous PRN Simon Lavonia SAILOR, MD        Allergies as of 09/01/2024 - Review Complete 09/01/2024  Allergen Reaction Noted   Clindamycin/lincomycin Itching 10/24/2014   Codeine Itching 10/24/2014    Family History  Problem Relation Age of Onset   Uterine cancer  Mother    Gout Father    Thyroid  disease Sister     Social History   Socioeconomic History   Marital status: Divorced    Spouse name: Not on file   Number of children: Not on file   Years of education: Not on file   Highest education level: Not on file  Occupational History   Not on file  Tobacco Use   Smoking status: Former    Current packs/day: 0.50    Average packs/day: 0.5 packs/day for 30.0 years (15.0 ttl pk-yrs)    Types: Cigarettes   Smokeless tobacco: Never   Tobacco comments:    quit smoking ~  2000  Vaping Use   Vaping status: Never Used  Substance and Sexual Activity   Alcohol use: No    Alcohol/week: 0.0 standard drinks of alcohol   Drug use: No   Sexual activity: Never  Other Topics Concern   Not on file  Social History Narrative   Divorced, 5 children.  Keeps a great grand 2 days out of the week.  He is [age].     Social Drivers of Corporate investment banker Strain: Not on file  Food Insecurity: No Food Insecurity (09/01/2024)   Hunger Vital Sign    Worried About Running Out of Food in the Last Year: Never true    Ran Out of Food in the Last Year: Never true  Transportation Needs: No Transportation Needs (09/01/2024)   PRAPARE - Administrator, Civil Service (Medical): No    Lack of Transportation (Non-Medical): No  Physical Activity: Not on file  Stress: Not on file  Social Connections: Unknown (09/01/2024)   Social Connection and Isolation Panel    Frequency of Communication with Friends and Family: More than three times a week    Frequency of Social Gatherings with Friends and Family: Three times a week    Attends Religious Services: Patient declined    Active Member of Clubs or Organizations: Patient declined    Attends Banker Meetings: Patient declined    Marital Status: Patient declined  Intimate Partner Violence: Not At Risk (09/01/2024)   Humiliation, Afraid, Rape, and Kick questionnaire    Fear of Current or  Ex-Partner: No    Emotionally Abused: No    Physically Abused: No    Sexually Abused: No    Review of Systems: As per HPI Physical Exam: Vital signs in last 24 hours: Temp:  [98.1 F (36.7 C)-98.3 F (36.8 C)] 98.2 F (36.8 C) (09/12 0027) Pulse Rate:  [65-72] 72 (09/12 0027) Resp:  [15-18] 15 (09/12 0027) BP: (116-170)/(69-97) 116/72 (09/12 0027) SpO2:  [96 %-100 %] 98 % (09/12 0027) Weight:  [81.2 kg] 81.2 kg (09/11 1342) Last BM Date :  (PTA)  General:   Alert,  Well-developed, well-nourished, pleasant and cooperative in NAD Head:  Normocephalic and atraumatic. Eyes:  Sclera clear, no icterus.   Conjunctiva pink. Ears:  Normal auditory acuity. Nose:  No deformity, discharge,  or lesions. Mouth:  No deformity or lesions.  Oropharynx pink & moist. Neck:  Supple; no masses or thyromegaly. Lungs:  Clear throughout to auscultation.   No wheezes, crackles, or rhonchi. No acute distress. Heart:  Regular rate and rhythm; no murmurs, clicks, rubs,  or gallops. Extremities:  Without clubbing or edema. Neurologic:  Alert and  oriented x4;  grossly normal neurologically. Skin:  Intact without significant lesions or rashes. Psych:  Alert and cooperative. Normal mood and affect. Abdomen:  Soft, nontender and nondistended. No masses, hepatosplenomegaly or hernias noted. Normal bowel sounds, without guarding, and without rebound.         Lab Results: Recent Labs    08/30/24 0754 09/01/24 1332  WBC 5.8 6.9  HGB 12.7 12.3  HCT 40.9 40.0  PLT 280 290   BMET Recent Labs    08/30/24 0754 09/01/24 1332  NA 140 142  K 3.6 4.4  CL 105 107  CO2 25 24  GLUCOSE 68* 174*  BUN 28* 24*  CREATININE 0.86 0.93  CALCIUM  8.4* 8.9   LFT Recent Labs    09/01/24 1332  PROT 6.4*  ALBUMIN 3.5  AST 19  ALT 8  ALKPHOS 93  BILITOT 0.4   PT/INR No results for input(s): LABPROT, INR in the last 72 hours.  Studies/Results: CT CHEST ABDOMEN PELVIS W CONTRAST Result Date:  09/01/2024 CLINICAL DATA:  Unintentional weight loss. EXAM: CT CHEST, ABDOMEN, AND PELVIS WITH CONTRAST TECHNIQUE: Multidetector CT imaging of the chest, abdomen and pelvis was performed following the standard protocol during bolus administration of intravenous contrast. RADIATION DOSE REDUCTION: This exam was performed according to the departmental dose-optimization program which includes automated exposure control, adjustment of the mA and/or kV according to patient size and/or use of iterative reconstruction technique. CONTRAST:  OMNIPAQUE  IOHEXOL  300 MG/ML  SOLN COMPARISON:  CT dated 05/02/2024. FINDINGS: CT CHEST FINDINGS Cardiovascular: There is no cardiomegaly or pericardial effusion. There is 3 vessel coronary vascular calcification. The thoracic aorta is unremarkable. The origins of the great vessels of the aortic arch and the central pulmonary arteries appear patent. Mediastinum/Nodes: No hilar or mediastinal adenopathy. Similar appearance of fluid distended esophagus may be related to reflux or delayed clearance. Underlying stricture or obstructing lesion of the distal esophagus or GE junction is not excluded. Further evaluation with esophagram or endoscopy, if not previously performed, recommended. No mediastinal fluid collection. Lungs/Pleura: No focal consolidation, pleural effusion, pneumothorax. The central airways are patent. Musculoskeletal: Degenerative changes of the spine. No acute osseous pathology. CT ABDOMEN PELVIS FINDINGS No intra-abdominal free air or free fluid. Hepatobiliary: The liver is unremarkable. No biliary dilatation. The gallbladder is unremarkable. Pancreas: Cystic lesions of the uncinate process and distal body of the pancreas measure up to 2.1 x 2.5 cm in the uncinate process as seen on the prior CT. These are not characterized by CT but may represent side branch IPMNs. Further evaluation with MRI without and with contrast if not previously performed recommended. No  active inflammatory changes. Spleen: Normal in size without focal abnormality. Adrenals/Urinary Tract: The adrenal glands unremarkable. There is a 4 mm nonobstructing left renal mid to lower pole stone. No hydronephrosis. The right kidney is unremarkable. Layering excreted contrast noted in the renal pelvis bilaterally. The visualized ureters and urinary bladder appear unremarkable. Stomach/Bowel: There is mild colonic diverticulosis. Loose stool noted in the rectum. There is no bowel obstruction or active inflammation. The appendix is normal. Vascular/Lymphatic: Mild aortoiliac atherosclerotic disease. The IVC is unremarkable. No portal venous gas. There is no adenopathy. Reproductive: Calcified uterine fibroid. No suspicious adnexal masses. Other: None Musculoskeletal: Degenerative changes of the spine. No acute osseous pathology. IMPRESSION: 1. No acute intrathoracic, abdominal, or pelvic pathology. 2. Similar appearance of fluid distended esophagus. Further evaluation with endoscopy, if not previously performed, recommended. 3. Cystic lesions of the pancreas as seen on the prior CT. MRI was recommended on prior CT. 4. A 4 mm nonobstructing left renal mid to lower pole stone. No hydronephrosis. 5. Mild colonic diverticulosis. No bowel obstruction. Normal appendix. 6.  Aortic Atherosclerosis (ICD10-I70.0). Electronically Signed   By: Vanetta Chou M.D.   On: 09/01/2024 16:40   CT HEAD WO CONTRAST ( ) Result Date: 09/01/2024 CLINICAL DATA:  Ringing in ears. EXAM: CT HEAD WITHOUT CONTRAST TECHNIQUE: Contiguous axial images were obtained from the base of the skull through the vertex without intravenous contrast. RADIATION DOSE REDUCTION: This exam was performed according to the departmental dose-optimization program which includes automated exposure control, adjustment of the mA and/or kV according to patient size and/or use of iterative reconstruction technique. COMPARISON:  May 02, 2024 FINDINGS: Brain:  There is generalized cerebral atrophy with  widening of the extra-axial spaces and ventricular dilatation. There are areas of decreased attenuation within the white matter tracts of the supratentorial brain, consistent with microvascular disease changes. Vascular: No hyperdense vessel or unexpected calcification. Skull: Normal. Negative for fracture or focal lesion. Sinuses/Orbits: Moderate severity sphenoid sinus mucosal thickening is seen. Other: None. IMPRESSION: 1. Generalized cerebral atrophy and microvascular disease changes of the supratentorial brain. 2. Moderate severity sphenoid sinus disease. Electronically Signed   By: Suzen Dials M.D.   On: 09/01/2024 16:30    Impression: Dysphagia, both to solids and liquids, intermittent, nonprogressive but associated with 10 pound unintentional weight loss  Abnormal CAT scan: CT abdomen and pelvis with contrast:  Fluid distended esophagus may be related to reflux with delayed clearance, underlying stricture or obstructive lesion of distal esophagus or GE junction cannot be ruled out.  Further evaluation with esophagram or endoscopy recommended  Cystic lesion in uncinate process in distal body of pancreas measuring 2.1 x 2.5 cm, may represent sidebranch IPMN, further evaluation with MRI without and with contrast recommended  Recurrent hypoglycemia  Colonic diverticulosis Tubular adenoma removed in 2016, was due for surveillance colonoscopy in 2021  Lab abnormalities: Total protein 6.4, BUN elevated 24, elevated blood sugar 174  Plan: EGD today with possible balloon dilation and esophageal biopsies with propofol . The risks and the benefits of the procedure were discussed with the patient in details, she understands and verbalizes consent.  Recommend elective MRI with and without contrast of pancreas as CAT scan is concerning for sidebranch IPMN.  Recommend outpatient surveillance colonoscopy, had multiple tubular adenomas removed in  2016.   LOS: 0 days   Estelita Manas, MD  09/02/2024, 3:48 AM

## 2024-09-02 NOTE — TOC Initial Note (Signed)
 Transition of Care [facility]) - Initial/Assessment Note    Patient Details  Name: Diana Coleman MRN: [id number] Date of Birth: May 14, 1951  [facility] CM/SW Contact:    Bascom Service, RN Phone Number: 09/02/2024, 2:11 PM  Clinical Narrative: Bilateral BKA.Spoke to dtr Diana Coleman about d/c plans-d/c plan home;has private duty aide, will change PCP. Has own transport home.                  Expected Discharge Plan: Home/Self Care Barriers to Discharge: Continued Medical Work up   Patient Goals and CMS Choice Patient states their goals for this hospitalization and ongoing recovery are:: Home CMS Medicare.gov Compare Post Acute Care list provided to:: Patient Represenative (must comment) Choice offered to / list presented to : Adult Children [place] ownership interest in [facility]provided to:: Adult Children    Expected Discharge Plan and Services   Discharge Planning Services: CM Consult Post Acute Care Choice: Resumption of Svcs/PTA Provider Living arrangements for the past 2 months: Single Family Home                                      Prior Living Arrangements/Services Living arrangements for the past 2 months: Single Family Home Lives with:: Self              Current home services: DME, Other (comment) (cane, rw;private duty aide through insurance)    Activities of Daily Living   ADL Screening (condition at time of admission) Independently performs ADLs?: Yes (appropriate for developmental age) Is the patient deaf or have difficulty hearing?: No Does the patient have difficulty seeing, even when wearing glasses/contacts?: No Does the patient have difficulty concentrating, remembering, or making decisions?: No  Permission Sought/Granted                  Emotional Assessment              Admission diagnosis:  Hypoglycemia [E16.2] Patient Active Problem List   Diagnosis Date Noted   Uncontrolled type 1 diabetes  mellitus with hypoglycemia, with long-term current use of insulin  (HCC) 09/01/2024   Hypoglycemia 09/01/2024   Type 2 diabetes mellitus with hyperlipidemia (HCC) 01/06/2022   Hyperglycemia    Dyslipidemia 10/18/2020   Vitreous hemorrhage of right eye due to diabetes mellitus (HCC) 02/01/2019   Preop cardiovascular exam 10/22/2018   Chest pain 10/22/2018   Vitreous hemorrhage, right eye (HCC) 09/21/2018   Proliferative diabetic retinopathy with macular edema (HCC) 09/21/2018   Non-pressure chronic ulcer of left calf, limited to breakdown of skin (HCC) 11/12/2016   S/P BKA (below knee amputation) bilateral (HCC) 11/12/2016   Anemia 10/04/2016   Finger osteomyelitis, right (HCC) 10/04/2016   Finger infection 10/04/2016   Osteomyelitis of finger (HCC) 10/04/2016   Gastroparesis 11/02/2014   Diabetes mellitus due to underlying condition with other specified complication (HCC)    Diabetic foot (HCC)    Nausea & vomiting    Hypertension complicating diabetes (HCC) 10/25/2014   GERD (gastroesophageal reflux disease) 10/25/2014   Cellulitis 10/24/2014   Diabetes (HCC) 10/24/2014   Insulin  dependent diabetes mellitus (HCC) 10/24/2014   PCP:  Diana Rojelio Caldron, NP Pharmacy:   [id number] [place], [place] - [address] AT [facility] [address] [place] [address] Phone: [phone] Fax: [phone]  [id number] - , [place] - [address]  AT [facility] [address] [place] [address] Phone: [phone] Fax: [phone]     Social Drivers of Health (SDOH) Social History: SDOH Screenings   Food Insecurity: No Food Insecurity (09/01/2024)  Housing: Low Risk  (09/01/2024)  Transportation Needs: No Transportation Needs (09/01/2024)  Utilities: Not At Risk (09/01/2024)  Social Connections: Unknown (09/01/2024)  Tobacco Use: Medium Risk (09/02/2024)   SDOH Interventions:     Readmission Risk  Interventions     No data to display

## 2024-09-02 NOTE — Progress Notes (Signed)
  Progress Note   Patient: Diana Coleman FMW:[id number] DOB: 02/05/1951 DOA: 09/01/2024     0 DOS: the patient was seen and examined on 09/02/2024   Brief hospital course: 73 year old woman PMH including diabetes on insulin , presented with paresthesia tongue, regained near, hypoglycemia.  Recently seen in the emergency department for the same and Tresiba  was decreased.  Also has had associated confusion and unintentional weight loss of 10 pounds.  Admitted for hypoglycemia recurrent, abnormal appearance of the esophagus on CT, cystic lesion of pancreas.  Seen by gastroenterology  Consultants GI  Procedures/Events 9/12 EGD: Torturous esophagus, benign-appearing esophageal stenosis, dilated.  Erythematous mucosa gastric body and antrum biopsied.  Outpatient manometry to evaluate for motility disorder   Assessment and Plan: Recurrent hypoglycemia  Type 1 diabetes mellitus on long-term insulin  Hemoglobin A1c 9.1 Continues to have hypoglycemia despite no insulin .  10 pound weight loss over the last 3 weeks.  Unclear etiology.  Continue dextrose  infusion presently needed to maintain blood sugars.  Expect resolution with insulin  out of since stone.   Recent weight loss unintentional Abnormal CT scan with distended esophagus, cystic lesion of the pancreas Mild esophageal stenosis Seen by gastroenterology, EGD showed torturous esophagus and mild esophageal stenosis.  Dilated.  Outpatient esophageal manometry needed to rule out motility disorder.  Also outpatient surveillance colonoscopy.  Follow-up with [facility] as an outpatient.   Increased PPI to BID.  GI recommended elective MRI with and without contrast of pancreas given concern for sidebranch IPMN, outpatient surveillance colonoscopy, this can be performed as an outpatient.   Hyperlipidemia Statin.   Hypertension On lisinopril  HCTZ resume home meds.   S/p bilateral BKA      Subjective:  Episode of hypoglycemia   Feels okay  today Confirms that she has lost weight and has been having trouble with her blood sugars for about 3 weeks  Physical Exam: Vitals:   09/02/24 1320 09/02/24 1330 09/02/24 1340 09/02/24 1501  BP: (!) 109/91 (!) 151/73 (!) 150/81 (!) 148/91  Pulse: 82 80 75 81  Resp: 20 20 19 19   Temp:    97.6 F (36.4 C)  TempSrc:    Oral  SpO2: 100% 95% 94% 98%  Weight:      Height:       Physical Exam Vitals reviewed.  Constitutional:      General: She is not in acute distress.    Appearance: She is not ill-appearing or toxic-appearing.  Cardiovascular:     Rate and Rhythm: Normal rate and regular rhythm.     Heart sounds: No murmur heard. Pulmonary:     Effort: Pulmonary effort is normal. No respiratory distress.     Breath sounds: No wheezing, rhonchi or rales.  Neurological:     Mental Status: She is alert.  Psychiatric:        Mood and Affect: Mood normal.        Behavior: Behavior normal.     Data Reviewed: 2 episodes of hypoglycemia this morning, 59, 63 Potassium 3.4, remainder CMP unremarkable CBC unremarkable Hemoglobin A1c 9.1  Family Communication: none  Disposition: Status is: Observation      Time spent: 35 minutes  Author: Toribio Door, MD 09/02/2024 3:48 PM  For on call review [email or web address].

## 2024-09-02 NOTE — Plan of Care (Signed)
  Problem: Nutrition: Goal: Adequate nutrition will be maintained Outcome: Progressing   Problem: Nutrition: Goal: Adequate nutrition will be maintained Outcome: Progressing   Problem: Activity: Goal: Risk for activity intolerance will decrease Outcome: Progressing   Problem: Clinical Measurements: Goal: Ability to maintain clinical measurements within normal limits will improve Outcome: Progressing Goal: Will remain free from infection Outcome: Progressing Goal: Diagnostic test results will improve Outcome: Progressing Goal: Respiratory complications will improve Outcome: Progressing Goal: Cardiovascular complication will be avoided Outcome: Progressing

## 2024-09-02 NOTE — Op Note (Signed)
 [facility] Patient Name: Diana Coleman Procedure Date: 09/02/2024 MRN: [id number] Attending MD: Estelita Manas , MD, [id number] Date of Birth: Mar 25, 1951 CSN: [id number] Age: 73 Admit Type: Inpatient Procedure:                Upper GI endoscopy Indications:              Dysphagia, Abnormal CT of the GI tract Providers:                Estelita Manas, MD, Ozell Pouch, Haskel Chris,                            Technician Referring MD:             [facility] Medicines:                Monitored Anesthesia Care Complications:            No immediate complications. Estimated blood loss:                            Minimal. Estimated Blood Loss:     Estimated blood loss was minimal. Procedure:                Pre-Anesthesia Assessment:                           - Prior to the procedure, a History and Physical                            was performed, and patient medications and                            allergies were reviewed. The patient's tolerance of                            previous anesthesia was also reviewed. The risks                            and benefits of the procedure and the sedation                            options and risks were discussed with the patient.                            All questions were answered, and informed consent                            was obtained. Prior Anticoagulants: The patient has                            taken no anticoagulant or antiplatelet agents. ASA                            Grade Assessment: III - A patient with severe  systemic disease. After reviewing the risks and                            benefits, the patient was deemed in satisfactory                            condition to undergo the procedure.                           After obtaining informed consent, the endoscope was                            passed under direct vision. Throughout the                            procedure,  the patient's blood pressure, pulse, and                            oxygen saturations were monitored continuously. The                            [id number]-H190 ([id number] Olympus endoscope was introduced                            through the mouth, and advanced to the second part                            of duodenum. The upper GI endoscopy was                            accomplished without difficulty. The patient                            tolerated the procedure well. Scope In: Scope Out: Findings:      The examined esophagus was significantly tortuous. Biopsies were       obtained from the proximal and distal esophagus with cold forceps for       histology of suspected eosinophilic esophagitis.      One benign-appearing, intrinsic mild stenosis was found at the level of       GE junction. The stenosis was traversed. A TTS dilator was passed       through the scope. Dilation with an 18-19-20 mm balloon dilator was       performed to 20 mm. The dilation site was examined following endoscope       reinsertion and showed moderate mucosal disruption and complete       resolution of luminal narrowing.      Diffuse moderately erythematous mucosa without bleeding was found in the       gastric body and in the gastric antrum. Biopsies were taken with a cold       forceps for Helicobacter pylori testing.      The cardia and gastric fundus were normal on retroflexion.      The examined duodenum was normal. Impression:               - Tortuous esophagus.                           -  Benign-appearing esophageal stenosis. Dilated.                           - Erythematous mucosa in the gastric body and                            antrum. Biopsied.                           - Normal examined duodenum.                           - Biopsies were taken with a cold forceps for                            evaluation of eosinophilic esophagitis. Moderate Sedation:      Patient did not receive moderate  sedation for this procedure, but       instead received monitored anesthesia care. Recommendation:           - Advance diet as tolerated.                           - Continue present medications.                           - Await pathology results.                           - Recommend outpatient esophageal manometry to                            evaluate for motility disorder. Procedure Code(s):        --- Professional ---                           [phone], Esophagogastroduodenoscopy, flexible,                            transoral; with transendoscopic balloon dilation of                            esophagus (less than 30 mm diameter)                           43239, 59, Esophagogastroduodenoscopy, flexible,                            transoral; with biopsy, single or multiple Diagnosis Code(s):        --- Professional ---                           Q39.9, Congenital malformation of esophagus,                            unspecified                           K22.2, Esophageal  obstruction                           K31.89, Other diseases of stomach and duodenum                           R13.10, Dysphagia, unspecified                           R93.3, Abnormal findings on diagnostic imaging of                            other parts of digestive tract CPT copyright 2022 [employer]. All rights reserved. The codes documented in this report are preliminary and upon coder review may  be revised to meet current compliance requirements. Estelita Manas, MD 09/02/2024 1:16:44 PM This report has been signed electronically. Number of Addenda: 0

## 2024-09-02 NOTE — Anesthesia Postprocedure Evaluation (Signed)
 Anesthesia Post Note  Patient: Diana Coleman  Procedure(s) Performed: EGD (ESOPHAGOGASTRODUODENOSCOPY) Balloon dilation wire-guided     Patient location during evaluation: Endoscopy Anesthesia Type: MAC Level of consciousness: oriented, patient cooperative and awake and alert Pain management: pain level controlled Vital Signs Assessment: post-procedure vital signs reviewed and stable Respiratory status: spontaneous breathing, nonlabored ventilation and respiratory function stable Cardiovascular status: blood pressure returned to baseline and stable Postop Assessment: no apparent nausea or vomiting Anesthetic complications: no   No notable events documented.  Last Vitals:  Vitals:   09/02/24 1315 09/02/24 1320  BP: 116/60 (!) 109/91  Pulse: 81 82  Resp: (!) 24 20  Temp: 36.5 C   SpO2: 100% 100%    Last Pain:  Vitals:   09/02/24 1315  TempSrc: Temporal  PainSc: 0-No pain                 Evonne Rinks,E. Aakash Hollomon

## 2024-09-03 DIAGNOSIS — Z89511 Acquired absence of right leg below knee: Secondary | ICD-10-CM | POA: Diagnosis not present

## 2024-09-03 DIAGNOSIS — E10649 Type 1 diabetes mellitus with hypoglycemia without coma: Secondary | ICD-10-CM | POA: Diagnosis not present

## 2024-09-03 DIAGNOSIS — Z89512 Acquired absence of left leg below knee: Secondary | ICD-10-CM | POA: Diagnosis not present

## 2024-09-03 LAB — BASIC METABOLIC PANEL WITH GFR
Anion gap: 10 (ref 5–15)
BUN: 18 mg/dL (ref 8–23)
CO2: 24 mmol/L (ref 22–32)
Calcium: 8.8 mg/dL — ABNORMAL LOW (ref 8.9–10.3)
Chloride: 108 mmol/L (ref 98–111)
Creatinine, Ser: 1 mg/dL (ref 0.44–1.00)
GFR, Estimated: 58 mL/min — ABNORMAL LOW (ref >60)
Glucose, Bld: 170 mg/dL — ABNORMAL HIGH (ref 70–99)
Potassium: 3.9 mmol/L (ref 3.5–5.1)
Sodium: 142 mmol/L (ref 135–145)

## 2024-09-03 LAB — GLUCOSE, CAPILLARY
Glucose-Capillary: 151 mg/dL — ABNORMAL HIGH (ref 70–99)
Glucose-Capillary: 169 mg/dL — ABNORMAL HIGH (ref 70–99)
Glucose-Capillary: 264 mg/dL — ABNORMAL HIGH (ref 70–99)
Glucose-Capillary: 145 mg/dL — ABNORMAL HIGH (ref 70–99)
Glucose-Capillary: 109 mg/dL — ABNORMAL HIGH (ref 70–99)
Glucose-Capillary: 196 mg/dL — ABNORMAL HIGH (ref 70–99)

## 2024-09-03 NOTE — Plan of Care (Signed)
  Problem: Metabolic: Goal: Ability to maintain appropriate glucose levels will improve Outcome: Progressing   Problem: Nutritional: Goal: Maintenance of adequate nutrition will improve Outcome: Progressing

## 2024-09-03 NOTE — Progress Notes (Signed)
  Progress Note   Patient: Diana Coleman FMW:[id number] DOB: Oct 16, 1951 DOA: 09/01/2024     1 DOS: the patient was seen and examined on 09/03/2024   Brief hospital course: 73 year old woman PMH including diabetes on insulin , presented with paresthesia tongue, regained near, hypoglycemia.  Recently seen in the emergency department for the same and Tresiba  was decreased.  Also has had associated confusion and unintentional weight loss of 10 pounds.  Admitted for hypoglycemia recurrent, abnormal appearance of the esophagus on CT, cystic lesion of pancreas.  Seen by gastroenterology  Consultants GI  Procedures/Events 9/12 EGD: Torturous esophagus, benign-appearing esophageal stenosis, dilated.  Erythematous mucosa gastric body and antrum biopsied.  Outpatient manometry to evaluate for motility disorder  Assessment and Plan: Recurrent hypoglycemia  Type 1 diabetes mellitus on long-term insulin  Hemoglobin A1c 9.1 Hypoglycemia now absent for 24 hours.  Dextrose  infusion discontinued.  Monitor on sliding scale insulin  today.  If remains without hypoglycemia, can discharge home tomorrow.     Recent weight loss unintentional Abnormal CT scan with distended esophagus, cystic lesion of the pancreas Mild esophageal stenosis Seen by gastroenterology, EGD showed torturous esophagus and mild esophageal stenosis.  Dilated.  Outpatient esophageal manometry needed to rule out motility disorder.  Also outpatient surveillance colonoscopy.  Follow-up with [facility] as an outpatient.   Increased PPI to BID.  GI recommended elective MRI with and without contrast of pancreas given concern for sidebranch IPMN, outpatient surveillance colonoscopy, this can be performed as an outpatient.   Hyperlipidemia Statin.   Hypertension On lisinopril  HCTZ resume home meds.   S/p bilateral BKA      Subjective:  Feels better  Physical Exam: Vitals:   09/02/24 1340 09/02/24 1501 09/02/24 1955 09/03/24 0439  BP: (!)  150/81 (!) 148/91 135/74 (!) 156/77  Pulse: 75 81 76 66  Resp: 19 19 20 20   Temp:  97.6 F (36.4 C) 99 F (37.2 C) 98.3 F (36.8 C)  TempSrc:  Oral Oral Oral  SpO2: 94% 98% 97% 94%  Weight:      Height:       Physical Exam Vitals reviewed.  Constitutional:      General: She is not in acute distress.    Appearance: She is not ill-appearing or toxic-appearing.  Cardiovascular:     Rate and Rhythm: Normal rate and regular rhythm.     Heart sounds: No murmur heard. Pulmonary:     Effort: Pulmonary effort is normal. No respiratory distress.     Breath sounds: No wheezing, rhonchi or rales.  Neurological:     Mental Status: She is alert.  Psychiatric:        Mood and Affect: Mood normal.        Behavior: Behavior normal.     Data Reviewed: CBG stable, no lows since 9/12 11 AM  Family Communication: daughter at bedside  Disposition: Status is: Inpatient Remains inpatient appropriate because: hypoglycemia     Time spent: 20 minutes  Author: Toribio Door, MD 09/03/2024 10:16 AM  For on call review [email or web address].

## 2024-09-04 ENCOUNTER — Encounter (HOSPITAL_COMMUNITY): Payer: Self-pay | Admitting: Gastroenterology

## 2024-09-04 DIAGNOSIS — E10649 Type 1 diabetes mellitus with hypoglycemia without coma: Secondary | ICD-10-CM | POA: Diagnosis not present

## 2024-09-04 DIAGNOSIS — K869 Disease of pancreas, unspecified: Secondary | ICD-10-CM | POA: Diagnosis not present

## 2024-09-04 DIAGNOSIS — K222 Esophageal obstruction: Secondary | ICD-10-CM | POA: Diagnosis not present

## 2024-09-04 LAB — GLUCOSE, CAPILLARY
Glucose-Capillary: 131 mg/dL — ABNORMAL HIGH (ref 70–99)
Glucose-Capillary: 145 mg/dL — ABNORMAL HIGH (ref 70–99)
Glucose-Capillary: 154 mg/dL — ABNORMAL HIGH (ref 70–99)
Glucose-Capillary: 240 mg/dL — ABNORMAL HIGH (ref 70–99)

## 2024-09-04 NOTE — Plan of Care (Signed)
  Problem: Education: Goal: Ability to describe self-care measures that may prevent or decrease complications (Diabetes Survival Skills Education) will improve Outcome: Adequate for Discharge Goal: Individualized Educational Video(s) Outcome: Adequate for Discharge   Problem: Coping: Goal: Ability to adjust to condition or change in health will improve Outcome: Adequate for Discharge   Problem: Fluid Volume: Goal: Ability to maintain a balanced intake and output will improve Outcome: Adequate for Discharge   Problem: Health Behavior/Discharge Planning: Goal: Ability to identify and utilize available resources and services will improve Outcome: Adequate for Discharge Goal: Ability to manage health-related needs will improve Outcome: Adequate for Discharge   Problem: Metabolic: Goal: Ability to maintain appropriate glucose levels will improve 09/04/2024 1007 by Horacio Suzen BIRCH, RN Outcome: Adequate for Discharge 09/04/2024 0824 by Horacio Suzen BIRCH, RN Outcome: Progressing   Problem: Nutritional: Goal: Maintenance of adequate nutrition will improve Outcome: Adequate for Discharge Goal: Progress toward achieving an optimal weight will improve Outcome: Adequate for Discharge   Problem: Skin Integrity: Goal: Risk for impaired skin integrity will decrease Outcome: Adequate for Discharge   Problem: Tissue Perfusion: Goal: Adequacy of tissue perfusion will improve Outcome: Adequate for Discharge   Problem: Education: Goal: Knowledge of General Education information will improve Description: Including pain rating scale, medication(s)/side effects and non-pharmacologic comfort measures Outcome: Adequate for Discharge   Problem: Health Behavior/Discharge Planning: Goal: Ability to manage health-related needs will improve Outcome: Adequate for Discharge   Problem: Clinical Measurements: Goal: Ability to maintain clinical measurements within normal limits will  improve Outcome: Adequate for Discharge Goal: Will remain free from infection Outcome: Adequate for Discharge Goal: Diagnostic test results will improve Outcome: Adequate for Discharge Goal: Respiratory complications will improve Outcome: Adequate for Discharge Goal: Cardiovascular complication will be avoided Outcome: Adequate for Discharge   Problem: Activity: Goal: Risk for activity intolerance will decrease Outcome: Adequate for Discharge   Problem: Nutrition: Goal: Adequate nutrition will be maintained Outcome: Adequate for Discharge   Problem: Coping: Goal: Level of anxiety will decrease Outcome: Adequate for Discharge   Problem: Elimination: Goal: Will not experience complications related to bowel motility Outcome: Adequate for Discharge Goal: Will not experience complications related to urinary retention Outcome: Adequate for Discharge   Problem: Pain Managment: Goal: General experience of comfort will improve and/or be controlled Outcome: Adequate for Discharge   Problem: Safety: Goal: Ability to remain free from injury will improve Outcome: Adequate for Discharge   Problem: Skin Integrity: Goal: Risk for impaired skin integrity will decrease Outcome: Adequate for Discharge

## 2024-09-04 NOTE — Progress Notes (Signed)
 Patient given discharge, medication, and follow up instructions, verbalized understanding, IV and telemetry monitor removed, personal belongings with patient, family to transport home

## 2024-09-04 NOTE — Discharge Summary (Signed)
 Physician Discharge Summary   Patient: Diana Coleman MRN: [id number] DOB: 1950-12-29  Admit date:     09/01/2024  Discharge date: 09/04/24  Discharge Physician: Toribio Door   PCP: Diana Rojelio Caldron, NP   Recommendations at discharge:   Recurrent hypoglycemia  Type 1 diabetes mellitus on long-term insulin  Hypoglycemia resolved.  Minimal sliding scale insulin  used here.  Will hold Tresiba  until follow-up with PCP.  She will continue to monitor her blood sugars.  Can resume Jardiance, hold Trulicity  until follow-up with PCP.  If blood sugars are persistently above 200, resume Trulicity .  She will be establishing with endocrinology.  Abnormal CT scan with distended esophagus, cystic lesion of the pancreas Mild esophageal stenosis Seen by gastroenterology, EGD showed torturous esophagus and mild esophageal stenosis.  Dilated.  Outpatient esophageal manometry needed to rule out motility disorder.  Also outpatient surveillance colonoscopy.  Follow-up with [facility] as an outpatient.   Increased PPI to BID.  GI recommended elective MRI with and without contrast of pancreas given concern for sidebranch IPMN, outpatient surveillance colonoscopy, this can be performed as an outpatient.  Discharge Diagnoses: Principal Problem:   Uncontrolled type 1 diabetes mellitus with hypoglycemia, with long-term current use of insulin  (HCC) Active Problems:   S/P BKA (below knee amputation) bilateral (HCC)   Hypoglycemia   Lesion of pancreas   Esophageal stenosis   Hypocalcemia  Resolved Problems:   * No resolved hospital problems. *  Hospital Course: 73 year old woman PMH including diabetes on insulin , presented with paresthesia tongue, regained near, hypoglycemia.  Recently seen in the emergency department for the same and Tresiba  was decreased.  Also has had associated confusion and unintentional weight loss of 10 pounds.  Admitted for hypoglycemia recurrent, abnormal appearance of the esophagus on  CT, cystic lesion of pancreas.  Seen by gastroenterology with recommendations for outpatient follow-up.  Hypoglycemia corrected with discontinuation of insulin .  Subsequently stabilized and discharged home with insulin  paused.  Consultants GI  Procedures/Events 9/12 EGD: Torturous esophagus, benign-appearing esophageal stenosis, dilated.  Erythematous mucosa gastric body and antrum biopsied.  Outpatient manometry to evaluate for motility disorder  Recurrent hypoglycemia  Type 1 diabetes mellitus on long-term insulin  Hemoglobin A1c 9.1 Hypoglycemia resolved.  Minimal sliding scale insulin  used here.  Will hold Tresiba  until follow-up with PCP.  She will continue to monitor her blood sugars.  Can resume Jardiance, hold Trulicity  until follow-up with PCP.  If blood sugars are persistently above 200, resume Trulicity .  She will be establishing with endocrinology.  Abnormal CT scan with distended esophagus, cystic lesion of the pancreas Mild esophageal stenosis Seen by gastroenterology, EGD showed torturous esophagus and mild esophageal stenosis.  Dilated.  Outpatient esophageal manometry needed to rule out motility disorder.  Also outpatient surveillance colonoscopy.  Follow-up with [facility] as an outpatient.   Increased PPI to BID.  GI recommended elective MRI with and without contrast of pancreas given concern for sidebranch IPMN, outpatient surveillance colonoscopy, this can be performed as an outpatient.   Hyperlipidemia Statin.   Hypertension On lisinopril  HCTZ resume home meds.   S/p bilateral BKA   Disposition: Home Diet recommendation:  Carb modified diet DISCHARGE MEDICATION: Allergies as of 09/04/2024       Reactions   Clindamycin/lincomycin Itching   Codeine Itching        Medication List     PAUSE taking these medications    insulin  degludec 100 UNIT/ML FlexTouch Pen Wait to take this until your doctor or other care provider tells  you to start again. Commonly  known as: TRESIBA  Inject 30 Units into the skin daily.       TAKE these medications    alendronate 70 MG tablet Commonly known as: FOSAMAX Take 70 mg by mouth once a week. Every Thursday   atorvastatin  20 MG tablet Commonly known as: LIPITOR Take 20 mg by mouth daily.   Jardiance 25 MG Tabs tablet Generic drug: empagliflozin Take 25 mg by mouth daily.   lisinopril -hydrochlorothiazide  20-12.5 MG tablet Commonly known as: ZESTORETIC  Take 1 tablet by mouth daily.   pantoprazole  40 MG tablet Commonly known as: PROTONIX  Take 40 mg by mouth daily.   Trulicity  4.5 MG/0.5ML Soaj Generic drug: Dulaglutide  Inject 4.5 mg into the skin once a week. Every Thursday       Feels fine, wants to go home  Discharge Exam: Diana Coleman   09/01/24 1342 09/02/24 1121  Weight: 81.2 kg 81.2 kg   Physical Exam Vitals reviewed.  Constitutional:      General: She is not in acute distress.    Appearance: She is not ill-appearing or toxic-appearing.  Cardiovascular:     Rate and Rhythm: Normal rate and regular rhythm.     Heart sounds: No murmur heard. Pulmonary:     Effort: Pulmonary effort is normal. No respiratory distress.     Breath sounds: No wheezing, rhonchi or rales.  Neurological:     Mental Status: She is alert.  Psychiatric:        Mood and Affect: Mood normal.        Behavior: Behavior normal.      Condition at discharge: good  The results of significant diagnostics from this hospitalization (including imaging, microbiology, ancillary and laboratory) are listed below for reference.   Imaging Studies: CT CHEST ABDOMEN PELVIS W CONTRAST Result Date: 09/01/2024 CLINICAL DATA:  Unintentional weight loss. EXAM: CT CHEST, ABDOMEN, AND PELVIS WITH CONTRAST TECHNIQUE: Multidetector CT imaging of the chest, abdomen and pelvis was performed following the standard protocol during bolus administration of intravenous contrast. RADIATION DOSE REDUCTION: This exam was performed  according to the departmental dose-optimization program which includes automated exposure control, adjustment of the mA and/or kV according to patient size and/or use of iterative reconstruction technique. CONTRAST:  OMNIPAQUE  IOHEXOL  300 MG/ML  SOLN COMPARISON:  CT dated 05/02/2024. FINDINGS: CT CHEST FINDINGS Cardiovascular: There is no cardiomegaly or pericardial effusion. There is 3 vessel coronary vascular calcification. The thoracic aorta is unremarkable. The origins of the great vessels of the aortic arch and the central pulmonary arteries appear patent. Mediastinum/Nodes: No hilar or mediastinal adenopathy. Similar appearance of fluid distended esophagus may be related to reflux or delayed clearance. Underlying stricture or obstructing lesion of the distal esophagus or GE junction is not excluded. Further evaluation with esophagram or endoscopy, if not previously performed, recommended. No mediastinal fluid collection. Lungs/Pleura: No focal consolidation, pleural effusion, pneumothorax. The central airways are patent. Musculoskeletal: Degenerative changes of the spine. No acute osseous pathology. CT ABDOMEN PELVIS FINDINGS No intra-abdominal free air or free fluid. Hepatobiliary: The liver is unremarkable. No biliary dilatation. The gallbladder is unremarkable. Pancreas: Cystic lesions of the uncinate process and distal body of the pancreas measure up to 2.1 x 2.5 cm in the uncinate process as seen on the prior CT. These are not characterized by CT but may represent side branch IPMNs. Further evaluation with MRI without and with contrast if not previously performed recommended. No active inflammatory changes. Spleen: Normal in size without focal abnormality.  Adrenals/Urinary Tract: The adrenal glands unremarkable. There is a 4 mm nonobstructing left renal mid to lower pole stone. No hydronephrosis. The right kidney is unremarkable. Layering excreted contrast noted in the renal pelvis bilaterally. The  visualized ureters and urinary bladder appear unremarkable. Stomach/Bowel: There is mild colonic diverticulosis. Loose stool noted in the rectum. There is no bowel obstruction or active inflammation. The appendix is normal. Vascular/Lymphatic: Mild aortoiliac atherosclerotic disease. The IVC is unremarkable. No portal venous gas. There is no adenopathy. Reproductive: Calcified uterine fibroid. No suspicious adnexal masses. Other: None Musculoskeletal: Degenerative changes of the spine. No acute osseous pathology. IMPRESSION: 1. No acute intrathoracic, abdominal, or pelvic pathology. 2. Similar appearance of fluid distended esophagus. Further evaluation with endoscopy, if not previously performed, recommended. 3. Cystic lesions of the pancreas as seen on the prior CT. MRI was recommended on prior CT. 4. A 4 mm nonobstructing left renal mid to lower pole stone. No hydronephrosis. 5. Mild colonic diverticulosis. No bowel obstruction. Normal appendix. 6.  Aortic Atherosclerosis (ICD10-I70.0). Electronically Signed   By: Vanetta Chou M.D.   On: 09/01/2024 16:40   CT HEAD WO CONTRAST ( ) Result Date: 09/01/2024 CLINICAL DATA:  Ringing in ears. EXAM: CT HEAD WITHOUT CONTRAST TECHNIQUE: Contiguous axial images were obtained from the base of the skull through the vertex without intravenous contrast. RADIATION DOSE REDUCTION: This exam was performed according to the departmental dose-optimization program which includes automated exposure control, adjustment of the mA and/or kV according to patient size and/or use of iterative reconstruction technique. COMPARISON:  May 02, 2024 FINDINGS: Brain: There is generalized cerebral atrophy with widening of the extra-axial spaces and ventricular dilatation. There are areas of decreased attenuation within the white matter tracts of the supratentorial brain, consistent with microvascular disease changes. Vascular: No hyperdense vessel or unexpected calcification. Skull: Normal.  Negative for fracture or focal lesion. Sinuses/Orbits: Moderate severity sphenoid sinus mucosal thickening is seen. Other: None. IMPRESSION: 1. Generalized cerebral atrophy and microvascular disease changes of the supratentorial brain. 2. Moderate severity sphenoid sinus disease. Electronically Signed   By: Suzen Dials M.D.   On: 09/01/2024 16:30    Microbiology: Results for orders placed or performed during the hospital encounter of 01/05/22  Culture, blood (routine x 2)     Status: Abnormal   Collection Time: 01/05/22  5:40 PM   Specimen: BLOOD  Result Value Ref Range Status   Specimen Description BLOOD RIGHT ANTECUBITAL  Final   Special Requests   Final    BOTTLES DRAWN AEROBIC AND ANAEROBIC Blood Culture results may not be optimal due to an inadequate volume of blood received in culture bottles   Culture  Setup Time   Final    GRAM POSITIVE COCCI ANAEROBIC BOTTLE ONLY CRITICAL RESULT CALLED TO, READ BACK BY AND VERIFIED WITH: PHARM JAMES LEDFORD 01/07/2022 @0112  BY JW Performed at [facility], [address] [place], [place] [address]    Culture PEPTOSTREPTOCOCCUS SPECIES (A)  Final   Report Status 01/09/2022 FINAL  Final  Blood Culture ID Panel (Reflexed)     Status: None   Collection Time: 01/05/22  5:40 PM  Result Value Ref Range Status   Enterococcus faecalis NOT DETECTED NOT DETECTED Final   Enterococcus Faecium NOT DETECTED NOT DETECTED Final   Listeria monocytogenes NOT DETECTED NOT DETECTED Final    Comment: RESULTS CALLED TO: PHARMD JAMES LEDFORD 01/07/22 @0112  BY JW   Staphylococcus species NOT DETECTED NOT DETECTED Final   Staphylococcus aureus (BCID) NOT DETECTED  NOT DETECTED Final   Staphylococcus epidermidis NOT DETECTED NOT DETECTED Final   Staphylococcus lugdunensis NOT DETECTED NOT DETECTED Final   Streptococcus species NOT DETECTED NOT DETECTED Final   Streptococcus agalactiae NOT DETECTED NOT DETECTED Final   Streptococcus pneumoniae NOT DETECTED  NOT DETECTED Final   Streptococcus pyogenes NOT DETECTED NOT DETECTED Final   A.calcoaceticus-baumannii NOT DETECTED NOT DETECTED Final   Bacteroides fragilis NOT DETECTED NOT DETECTED Final   Enterobacterales NOT DETECTED NOT DETECTED Final   Enterobacter cloacae complex NOT DETECTED NOT DETECTED Final   Escherichia coli NOT DETECTED NOT DETECTED Final   Klebsiella aerogenes NOT DETECTED NOT DETECTED Final   Klebsiella oxytoca NOT DETECTED NOT DETECTED Final   Klebsiella pneumoniae NOT DETECTED NOT DETECTED Final   Proteus species NOT DETECTED NOT DETECTED Final   Salmonella species NOT DETECTED NOT DETECTED Final   Serratia marcescens NOT DETECTED NOT DETECTED Final   Haemophilus influenzae NOT DETECTED NOT DETECTED Final   Neisseria meningitidis NOT DETECTED NOT DETECTED Final   Pseudomonas aeruginosa NOT DETECTED NOT DETECTED Final   Stenotrophomonas maltophilia NOT DETECTED NOT DETECTED Final   Candida albicans NOT DETECTED NOT DETECTED Final   Candida auris NOT DETECTED NOT DETECTED Final   Candida glabrata NOT DETECTED NOT DETECTED Final   Candida krusei NOT DETECTED NOT DETECTED Final   Candida parapsilosis NOT DETECTED NOT DETECTED Final   Candida tropicalis NOT DETECTED NOT DETECTED Final   Cryptococcus neoformans/gattii NOT DETECTED NOT DETECTED Final    Comment: Performed at [facility], [address] [place], [place] [address]  Culture, blood (routine x 2)     Status: None   Collection Time: 01/05/22  5:45 PM   Specimen: BLOOD  Result Value Ref Range Status   Specimen Description BLOOD LEFT ANTECUBITAL  Final   Special Requests   Final    BOTTLES DRAWN AEROBIC AND ANAEROBIC Blood Culture results may not be optimal due to an inadequate volume of blood received in culture bottles   Culture   Final    NO GROWTH 5 DAYS Performed at [facility], [address], [place], [place] [address]    Report Status 01/10/2022 FINAL  Final  Resp Panel by RT-PCR (Flu  A&B, Covid) Nasopharyngeal Swab     Status: None   Collection Time: 01/05/22 11:44 PM   Specimen: Nasopharyngeal Swab; Nasopharyngeal(NP) swabs in vial transport medium  Result Value Ref Range Status   SARS Coronavirus 2 by RT PCR NEGATIVE NEGATIVE Final    Comment: (NOTE) SARS-CoV-2 target nucleic acids are NOT DETECTED.  The SARS-CoV-2 RNA is generally detectable in upper respiratory specimens during the acute phase of infection. The lowest concentration of SARS-CoV-2 viral copies this assay can detect is 138 copies/mL. A negative result does not preclude SARS-Cov-2 infection and should not be used as the sole basis for treatment or other patient management decisions. A negative result may occur with  improper specimen collection/handling, submission of specimen other than nasopharyngeal swab, presence of viral mutation(s) within the areas targeted by this assay, and inadequate number of viral copies(<138 copies/mL). A negative result must be combined with clinical observations, patient history, and epidemiological information. The expected result is Negative.  Fact Sheet for Patients:  BloggerCourse.com  Fact Sheet for Healthcare Providers:  SeriousBroker.it  This test is no t yet approved or cleared by the [place]  FDA and  has been authorized for detection and/or diagnosis of SARS-CoV-2 by FDA under an Emergency Use Authorization (  EUA). This EUA will remain  in effect (meaning this test can be used) for the duration of the COVID-19 declaration under Section 564(b)(1) of the Act, 21 U.S.C.section 360bbb-3(b)(1), unless the authorization is terminated  or revoked sooner.       Influenza A by PCR NEGATIVE NEGATIVE Final   Influenza B by PCR NEGATIVE NEGATIVE Final    Comment: (NOTE) The Xpert Xpress SARS-CoV-2/FLU/RSV plus assay is intended as an aid in the diagnosis of influenza from Nasopharyngeal swab specimens  and should not be used as a sole basis for treatment. Nasal washings and aspirates are unacceptable for Xpert Xpress SARS-CoV-2/FLU/RSV testing.  Fact Sheet for Patients: BloggerCourse.com  Fact Sheet for Healthcare Providers: SeriousBroker.it  This test is not yet approved or cleared by the [place]  FDA and has been authorized for detection and/or diagnosis of SARS-CoV-2 by FDA under an Emergency Use Authorization (EUA). This EUA will remain in effect (meaning this test can be used) for the duration of the COVID-19 declaration under Section 564(b)(1) of the Act, 21 U.S.C. section 360bbb-3(b)(1), unless the authorization is terminated or revoked.  Performed at [facility], [address] [place], [place] [address]   Aerobic/Anaerobic Culture w Gram Stain (surgical/deep wound)     Status: None   Collection Time: 01/05/22 11:48 PM   Specimen: PATH Digit amputation; Tissue  Result Value Ref Range Status   Specimen Description TISSUE RIGHT FINGER  Final   Special Requests RIGHT INDEX FINGER  Final   Gram Stain   Final    NO SQUAMOUS EPITHELIAL CELLS SEEN FEW WBC SEEN NO ORGANISMS SEEN Performed at [facility], [address] [place], [place] [address]    Culture   Final    FEW HAEMOPHILUS PARAINFLUENZAE RARE AGGREGATIBACTER SEGNIS BETA LACTAMASE POSITIVE RARE PEPTOSTREPTOCOCCUS SPECIES    Report Status 01/12/2022 FINAL  Final    Labs: CBC: Recent Labs  Lab 08/30/24 0754 09/01/24 1332 09/02/24 0444  WBC 5.8 6.9 5.8  NEUTROABS  --  4.4  --   HGB 12.7 12.3 11.3*  HCT 40.9 40.0 36.8  MCV 90.7 90.1 89.8  PLT 280 290 261   Basic Metabolic Panel: Recent Labs  Lab 08/30/24 0754 09/01/24 1332 09/02/24 0444 09/03/24 0050  NA 140 142 143 142  K 3.6 4.4 3.4* 3.9  CL 105 107 108 108  CO2 25 24 25 24   GLUCOSE 68* 174* 74 170*  BUN 28* 24* 19 18  CREATININE 0.86 0.93 0.78 1.00  CALCIUM  8.4* 8.9  8.7* 8.8*   Liver Function Tests: Recent Labs  Lab 09/01/24 1332 09/02/24 0444  AST 19 12*  ALT 8 <5  ALKPHOS 93 83  BILITOT 0.4 0.3  PROT 6.4* 5.9*  ALBUMIN 3.5 3.3*   CBG: Recent Labs  Lab 09/03/24 1957 09/03/24 2359 09/04/24 0420 09/04/24 0752 09/04/24 1130  GLUCAP 196* 154* 145* 131* 240*    Discharge time spent: less than 30 minutes.  Signed: Toribio Door, MD [facility] 09/04/2024

## 2024-09-04 NOTE — TOC Transition Note (Signed)
 Transition of Care [facility]) - Discharge Note   Patient Details  Name: Diana Coleman MRN: [id number] Date of Birth: February 27, 1951  [facility] CM/SW Contact:  Sonda Manuella Quill, RN Phone Number: 09/04/2024, 10:01 AM   Clinical Narrative:    D/C orders received; no TOC needs.   Final next level of care: Home/Self Care Barriers to Discharge: No Barriers Identified   Patient Goals and CMS Choice Patient states their goals for this hospitalization and ongoing recovery are:: Home CMS Medicare.gov Compare Post Acute Care list provided to:: Patient Represenative (must comment) Choice offered to / list presented to : Adult Children [place] ownership interest in [facility].provided to:: Adult Children    Discharge Placement                       Discharge Plan and Services Additional resources added to the After Visit Summary for     Discharge Planning Services: CM Consult Post Acute Care Choice: Resumption of Svcs/PTA Provider                               Social Drivers of Health (SDOH) Interventions SDOH Screenings   Food Insecurity: No Food Insecurity (09/01/2024)  Housing: Low Risk  (09/01/2024)  Transportation Needs: No Transportation Needs (09/01/2024)  Utilities: Not At Risk (09/01/2024)  Social Connections: Unknown (09/01/2024)  Tobacco Use: Medium Risk (09/02/2024)     Readmission Risk Interventions     No data to display

## 2024-09-04 NOTE — Plan of Care (Signed)
   Problem: Metabolic: Goal: Ability to maintain appropriate glucose levels will improve Outcome: Progressing

## 2024-09-07 LAB — SURGICAL PATHOLOGY

## 2024-09-08 ENCOUNTER — Encounter (INDEPENDENT_AMBULATORY_CARE_PROVIDER_SITE_OTHER): Admitting: Ophthalmology

## 2024-09-08 DIAGNOSIS — Z7984 Long term (current) use of oral hypoglycemic drugs: Secondary | ICD-10-CM | POA: Diagnosis not present

## 2024-09-08 DIAGNOSIS — I10 Essential (primary) hypertension: Secondary | ICD-10-CM

## 2024-09-08 DIAGNOSIS — H35033 Hypertensive retinopathy, bilateral: Secondary | ICD-10-CM | POA: Diagnosis not present

## 2024-09-08 DIAGNOSIS — E113593 Type 2 diabetes mellitus with proliferative diabetic retinopathy without macular edema, bilateral: Secondary | ICD-10-CM

## 2024-09-08 DIAGNOSIS — H43812 Vitreous degeneration, left eye: Secondary | ICD-10-CM

## 2024-09-15 ENCOUNTER — Encounter (HOSPITAL_COMMUNITY): Payer: Self-pay

## 2024-09-15 ENCOUNTER — Emergency Department (HOSPITAL_COMMUNITY)

## 2024-09-15 ENCOUNTER — Emergency Department (HOSPITAL_COMMUNITY)
Admission: EM | Admit: 2024-09-15 | Discharge: 2024-09-15 | Disposition: A | Discharge status: Home / Self Care | Attending: Emergency Medicine | Admitting: Emergency Medicine

## 2024-09-15 ENCOUNTER — Other Ambulatory Visit: Payer: Self-pay

## 2024-09-15 DIAGNOSIS — Z79899 Other long term (current) drug therapy: Secondary | ICD-10-CM | POA: Diagnosis not present

## 2024-09-15 DIAGNOSIS — R350 Frequency of micturition: Secondary | ICD-10-CM | POA: Diagnosis not present

## 2024-09-15 DIAGNOSIS — I10 Essential (primary) hypertension: Secondary | ICD-10-CM | POA: Insufficient documentation

## 2024-09-15 DIAGNOSIS — M542 Cervicalgia: Secondary | ICD-10-CM | POA: Insufficient documentation

## 2024-09-15 DIAGNOSIS — I7121 Aneurysm of the ascending aorta, without rupture: Secondary | ICD-10-CM | POA: Diagnosis not present

## 2024-09-15 DIAGNOSIS — E1165 Type 2 diabetes mellitus with hyperglycemia: Secondary | ICD-10-CM | POA: Diagnosis not present

## 2024-09-15 DIAGNOSIS — Z794 Long term (current) use of insulin: Secondary | ICD-10-CM | POA: Insufficient documentation

## 2024-09-15 LAB — URINALYSIS, ROUTINE W REFLEX MICROSCOPIC
Bacteria, UA: NONE SEEN
Bilirubin Urine: NEGATIVE
Glucose, UA: >=500 mg/dL — AB
Hgb urine dipstick: NEGATIVE
Ketones, ur: 5 mg/dL — AB
Leukocytes,Ua: NEGATIVE
Nitrite: NEGATIVE
Protein, ur: NEGATIVE mg/dL
Specific Gravity, Urine: 1.028 (ref 1.005–1.030)
pH: 5 (ref 5.0–8.0)

## 2024-09-15 LAB — CBC
HCT: 43.9 % (ref 36.0–46.0)
Hemoglobin: 14 g/dL (ref 12.0–15.0)
MCH: 27.7 pg (ref 26.0–34.0)
MCHC: 31.9 g/dL (ref 30.0–36.0)
MCV: 86.8 fL (ref 80.0–100.0)
Platelets: 337 K/uL (ref 150–400)
RBC: 5.06 MIL/uL (ref 3.87–5.11)
RDW: 12.9 % (ref 11.5–15.5)
WBC: 8.4 K/uL (ref 4.0–10.5)
nRBC: 0 % (ref 0.0–0.2)

## 2024-09-15 LAB — BASIC METABOLIC PANEL WITH GFR
Anion gap: 13 (ref 5–15)
BUN: 30 mg/dL — ABNORMAL HIGH (ref 8–23)
CO2: 23 mmol/L (ref 22–32)
Calcium: 9.8 mg/dL (ref 8.9–10.3)
Chloride: 102 mmol/L (ref 98–111)
Creatinine, Ser: 1.1 mg/dL — ABNORMAL HIGH (ref 0.44–1.00)
GFR, Estimated: 52 mL/min — ABNORMAL LOW (ref >60)
Glucose, Bld: 261 mg/dL — ABNORMAL HIGH (ref 70–99)
Potassium: 4.1 mmol/L (ref 3.5–5.1)
Sodium: 138 mmol/L (ref 135–145)

## 2024-09-15 LAB — TROPONIN T, HIGH SENSITIVITY: Troponin T High Sensitivity: <15 ng/L (ref 0–19)

## 2024-09-15 LAB — CBG MONITORING, ED: Glucose-Capillary: 219 mg/dL — ABNORMAL HIGH (ref 70–99)

## 2024-09-15 MED ORDER — LIDOCAINE 5 % EX PTCH: 1.0000 | MEDICATED_PATCH | CUTANEOUS | 0 refills | Status: AC

## 2024-09-15 MED ORDER — ACETAMINOPHEN 500 MG PO TABS: 500.0000 mg | ORAL_TABLET | Freq: Four times a day (QID) | ORAL | 0 refills | Status: AC | PRN

## 2024-09-15 MED ORDER — IOHEXOL 350 MG/ML SOLN
75.0000 mL | Freq: Once | INTRAVENOUS | Status: AC | PRN
  Administered 2024-09-15: 75 mL via INTRAVENOUS

## 2024-09-15 MED ORDER — ACETAMINOPHEN 500 MG PO TABS
1000.0000 mg | ORAL_TABLET | Freq: Once | ORAL | Status: AC
  Administered 2024-09-15: 1000 mg via ORAL
  Filled 2024-09-15: qty 2

## 2024-09-15 NOTE — Discharge Instructions (Signed)
 You have been evaluated for your symptoms.  Your pain is likely muscular pain.  You may take Tylenol  and applying Lidoderm  patch to affected area for symptom control.  CT scan also shows mild aneurysm of your thoracic aorta measuring 3.2 cm.  It is recommended for you to have a repeat chest CT scan yearly to monitor its size to ensure it does not get too large.  Follow-up closely with your doctor for further care, return if you have any concern.

## 2024-09-15 NOTE — ED Provider Notes (Signed)
  EMERGENCY DEPARTMENT AT [facility] Provider Note   CSN: [id number] Arrival date & time: 09/15/24  1451     Patient presents with: Arm Pain, Flank Pain, and Urinary Frequency   Diana Coleman is a 73 y.o. female.   The history is provided by the patient and medical records. No language interpreter was used.  Arm Pain  Flank Pain  Urinary Frequency     73 year old female with history of hypertension, diabetes, GERD, presenting complaining of left-sided pain.  Patient with for the past 4 days she has had intermittent pain to the left side of her body.  States pain is more so to the left side of her neck, and sometimes radiates down to just below her left breast.  Pain is intermittent and happen sporadically usually brought on by movement.  No report of any lightheadedness or dizziness no fever no chills no nausea vomiting no shortness of breath and no trouble urinating.  She is right-hand dominant.  She is a double amputee and uses a cane to walk.  She denies any strenuous activities or heavy lifting.  She initially thought the pain was related to gas and she took some OTC medication without relief.  Prior to Admission medications   Medication Sig Start Date End Date Taking? Authorizing Provider  alendronate (FOSAMAX) 70 MG tablet Take 70 mg by mouth once a week. Every Thursday 10/01/23   [provider]  atorvastatin  (LIPITOR) 20 MG tablet Take 20 mg by mouth daily. 07/26/24   [provider]  Dulaglutide  (TRULICITY ) 4.5 MG/0.5ML SOAJ Inject 4.5 mg into the skin once a week. Every Thursday    [provider]  insulin  degludec (TRESIBA ) 100 UNIT/ML FlexTouch Pen Inject 30 Units into the skin daily. 08/30/24   Levander Houston, MD  JARDIANCE 25 MG TABS tablet Take 25 mg by mouth daily. 10/01/23   [provider]  lisinopril -hydrochlorothiazide  (ZESTORETIC ) 20-12.5 MG tablet Take 1 tablet by mouth daily. 05/12/23   [provider]   pantoprazole  (PROTONIX ) 40 MG tablet Take 40 mg by mouth daily. 08/03/23   [provider]    Allergies: Clindamycin/lincomycin and Codeine    Review of Systems  Genitourinary:  Positive for flank pain and frequency.  All other systems reviewed and are negative.   Updated Vital Signs BP (!) 144/72 (BP Location: Right Arm)   Pulse 81   Temp 98.1 F (36.7 C) (Oral)   Resp 15   Ht 5' 7 (1.702 m)   Wt 80.2 kg   SpO2 97%   BMI 27.71 kg/m   Physical Exam Vitals and nursing note reviewed.  Constitutional:      General: She is not in acute distress.    Appearance: She is well-developed.  HENT:     Head: Atraumatic.  Eyes:     Conjunctiva/sclera: Conjunctivae normal.  Cardiovascular:     Rate and Rhythm: Normal rate and regular rhythm.     Pulses: Normal pulses.     Heart sounds: Normal heart sounds.  Pulmonary:     Effort: Pulmonary effort is normal.  Chest:     Chest wall: Tenderness (Some tenderness to left inframammary fold to gentle palpation.) present.  Musculoskeletal:     Cervical back: Normal range of motion and neck supple. Tenderness (Tenderness to left anterior lateral neck on palpation without any pulsatile mass no overlying skin changes.  No significant midline spine tenderness.) present. No rigidity.  Skin:    Findings: No rash.  Neurological:     Mental Status: She is alert.  Psychiatric:        Mood and Affect: Mood normal.     (all labs ordered are listed, but only abnormal results are displayed) Labs Reviewed  URINALYSIS, ROUTINE W REFLEX MICROSCOPIC - Abnormal; Notable for the following components:      Result Value   Color, Urine STRAW (*)    Glucose, UA >=500 (*)    Ketones, ur 5 (*)    All other components within normal limits  BASIC METABOLIC PANEL WITH GFR - Abnormal; Notable for the following components:   Glucose, Bld 261 (*)    BUN 30 (*)    Creatinine, Ser 1.10 (*)    GFR, Estimated 52 (*)    All other components within  normal limits  CBG MONITORING, ED - Abnormal; Notable for the following components:   Glucose-Capillary 219 (*)    All other components within normal limits  CBC  TROPONIN T, HIGH SENSITIVITY  TROPONIN T, HIGH SENSITIVITY    EKG: EKG Interpretation Date/Time:  Thursday September 15 2024 18:49:16 EDT Ventricular Rate:  77 PR Interval:  180 QRS Duration:  118 QT Interval:  408 QTC Calculation: 462 R Axis:   -9  Text Interpretation: Sinus rhythm Multiform ventricular premature complexes Nonspecific intraventricular conduction delay Anterior infarct, old Confirmed by Franklyn Gills [phone]) on 09/15/2024 7:08:46 PM  Radiology: CT Angio Chest/Abd/Pel for Dissection W and/or Wo Contrast Result Date: 09/15/2024 EXAM: CTA CHEST, ABDOMEN AND PELVIS WITH AND WITHOUT CONTRAST 09/15/2024 09:18:34 PM TECHNIQUE: CTA of the chest was performed with and without the administration of intravenous contrast. CTA of the abdomen and pelvis was performed with and without the administration of intravenous contrast. Multiplanar reformatted images are provided for review. MIP images are provided for review. Automated exposure control, iterative reconstruction, and/or weight based adjustment of the mA/kV was utilized to reduce the radiation dose to as low as reasonably achievable. 75 mL of iohexol  (Omnipaque ) 350 mg/mL was administered. COMPARISON: Comparison to a study from November 01, 2024. CLINICAL HISTORY: Acute aortic syndrome (AAS) suspected. LEFT sided neck, LEFT Arm, LEFT breast, and LEFT flank pain. Pt endorses increased urinary frequency , wbc's 8.4, GFR 52. FINDINGS: VASCULATURE: AORTA: The thoracic aorta is mildly dilated in its proximal segment measuring 3.2 cm in diameter beyond the takeoff of the left subclavian artery. The ascending aorta and distal descending thoracic aorta are of normal caliber. No intramural hematoma or dissection. No significant atherosclerotic calcification. PULMONARY ARTERIES: The  central pulmonary arteries are enlarged in keeping with changes of pulmonary arterial hypertension. No pulmonary embolism with the limits of this exam. GREAT VESSELS OF AORTIC ARCH: Arch vasculature demonstrates bovine arch configuration and is widely patent proximally. CELIAC TRUNK: No acute finding mentioned. SUPERIOR MESENTERIC ARTERY: No acute finding mentioned. INFERIOR MESENTERIC ARTERY: No acute finding mentioned. RENAL ARTERIES: No acute finding mentioned. ILIAC ARTERIES: No acute finding mentioned. CHEST: MEDIASTINUM: Extensive multivessel coronary artery calcifications. Global cardiac size within normal limits. No pericardial effusion. The esophagus appears dilated and patulous with tonic contraction of the gastroesophageal junction suggesting changes of Achalasia. No pathologic thoracic adenopathy. Visualized thyroid  is unremarkable. LUNGS AND PLEURA: No mention of lung or pleural disease. THORACIC BONES AND SOFT TISSUES: No acute bone abnormality. No lytic or blastic bone lesion. ABDOMEN AND PELVIS: LIVER: No liver findings mentioned. GALLBLADDER AND BILE DUCTS: Cholelithiasis without superimposed pericholecystic inflammatory change. No intra or extrahepatic biliary ductal dilation. SPLEEN: No spleen findings mentioned. PANCREAS: Multiple  cystic lesions within the head and proximal body of the pancreas are again identified and appears stable since prior examination though are not optimally assessed on this exam. Dominant lesion within the head of the pancreas measures 2.1 x 2.7 cm. Recommend follow up pre and post contrast MRI/MRCP or pancreatic protocol CT. ADRENAL GLANDS: No adrenal gland findings mentioned. KIDNEYS, URETERS AND BLADDER: No kidney, ureter, or bladder findings mentioned. GI AND BOWEL: Moderate colonic stool burden without evidence of obstruction. Appendix normal. The stomach, small bowel, and large bowel are otherwise unremarkable. REPRODUCTIVE: Calcified involuted uterine fibroids are  seen within the uterus. No adnexal masses are seen. PERITONEUM AND RETROPERITONEUM: No findings mentioned. LYMPH NODES: No pathologic thoracic adenopathy. ABDOMINAL BONES AND SOFT TISSUES: Osseous structures are age appropriate. No acute bone abnormality. No lytic or blastic bone lesion. IMPRESSION: 1. No evidence of acute aortic syndrome. 2. Mild dilation of the proximal thoracic aorta measuring 3.2 cm. No intramural hematoma or dissection. Recommend annual imaging followup by CTA or mra. This recommendation follows 2010 accf/aha/aats/ACR/asa/sca/scai/sir/sts/svm guidelines for the diagnosis and management of patients with thoracic aortic disease. Circulation. 2010; 121: Z733-z630. Aortic aneurysm nos (icd10-i71.9) 3. Stable multiple cystic lesions within the head and proximal body of the pancreas, with a dominant lesion measuring 2.1 x 2.7 cm. Recommend follow-up pre- and post-contrast MRI/MRCP or pancreatic protocol CT. 4. Cholelithiasis without superimposed pericholecystic inflammatory change. 5. Moderate colonic stool burden without evidence of obstruction. Electronically signed by: Dorethia Molt MD 09/15/2024 09:32 PM EDT RP Workstation: [id number]   DG Chest Port 1 View Result Date: 09/15/2024 CLINICAL DATA:  Chest pain EXAM: PORTABLE CHEST 1 VIEW COMPARISON:  Chest x-ray 10/02/2023 FINDINGS: The heart size and mediastinal contours are within normal limits. Both lungs are clear. The visualized skeletal structures are unremarkable. IMPRESSION: No active disease. Electronically Signed   By: Greig Pique M.D.   On: 09/15/2024 19:05     Procedures   Medications Ordered in the ED  iohexol  (OMNIPAQUE ) 350 MG/ML injection 75 mL (75 mLs Intravenous Contrast Given 09/15/24 2103)                                    Medical Decision Making Amount and/or Complexity of Data Reviewed Labs: ordered. Radiology: ordered.  Risk Prescription drug management.   BP (!) 158/78 (BP Location: Right Arm)    Pulse 81   Temp 97.6 F (36.4 C) (Oral)   Resp 15   Ht 5' 7 (1.702 m)   Wt 80.2 kg   SpO2 99%   BMI 27.71 kg/m   66:81 PM  73 year old female with history of hypertension, diabetes, GERD, presenting complaining of left-sided pain.  Patient with for the past 4 days she has had intermittent pain to the left side of her body.  States pain is more so to the left side of her neck, and sometimes radiates down to just below her left breast.  Pain is intermittent and happen sporadically usually brought on by movement.  No report of any lightheadedness or dizziness no fever no chills no nausea vomiting no shortness of breath and no trouble urinating.  She is right-hand dominant.  She is a double amputee and uses a cane to walk.  She denies any strenuous activities or heavy lifting.  She initially thought the pain was related to gas and she took some OTC medication without relief.  Exam notable for reproducible tenderness to left  side of neck and left lateral chest wall without any overlying skin changes.  Patient is able to range her left shoulder without difficulty.  No tenderness to left hip or flank on palpation.  As patient has history of diabetes and hypertension and having vague neck pain chest pain, cardiac workup initiated.  -Labs ordered, independently viewed and interpreted by me.  Labs remarkable for elevated CBG of 261 without evidence of DKA.  Normal WBC, normal H&H, urinalysis without signs of urinary tract infection, normal troponin. -The patient was maintained on a cardiac monitor.  I personally viewed and interpreted the cardiac monitored which showed an underlying rhythm of: NSR -Imaging independently viewed and interpreted by me and I agree with radiologist's interpretation.  Result remarkable for CT scan of chest abdomen pelvis without any evidence of dissection.  Patient does have mild dilatation of the proximal thoracic aorta measuring 3.2 cm.  I discussed this patient recommend  annual imaging with follow-up CTA to ensure no enlargements of aneurysm.  Evidence of cholelithiasis without evidence of cholecystitis.  Moderate stool burden noted. -This patient presents to the ED for concern of pain, this involves an extensive number of treatment options, and is a complaint that carries with it a high risk of complications and morbidity.  The differential diagnosis includes muscle skeletal pain, dissection, strain, torticollis, ACS, PTX. -Co morbidities that complicate the patient evaluation includes DM, HTN, GERD -Treatment includes tylenol  -Reevaluation of the patient after these medicines showed that the patient improved -PCP office notes or outside notes reviewed -Discussion with attending Dr. Franklyn -Escalation to admission/observation considered: patients feels much better, is comfortable with discharge, and will follow up with PCP -Prescription medication considered, patient comfortable with tylenol  and lidoderm  -Social Determinant of Health considered which includes tobacco use      Final diagnoses:  Neck pain on left side  Aneurysm of ascending aorta without rupture    ED Discharge Orders          Ordered    acetaminophen  (TYLENOL ) 500 MG tablet  Every 6 hours PRN        09/15/24 2255    lidocaine  (LIDODERM ) 5 %  Every 24 hours        09/15/24 2255               Nivia Colon, PA-C 09/15/24 2257    Franklyn Sid SAILOR, MD 09/21/24 1754

## 2024-09-15 NOTE — ED Triage Notes (Signed)
 Pt arrives to triage with complaints of LEFT sided neck, LEFT Arm, LEFT breast, and LEFT flank pain. Pt endorses increased urinary frequency. No rash present. Pt denies chest pain, denies shortness of breath. Pt denies cardiac hx.

## 2024-09-20 NOTE — Progress Notes (Addendum)
 [facility]    Date: 09/20/2024 Patient name: Alese Furniss Date of birth: December 18, 1951   ASSESSMENT/PLAN:  Diagnoses and all orders for this visit: Type 2 diabetes mellitus with hyperglycemia, with long-term current use of insulin  (*) Type 2 diabetes mellitus with other circulatory complication, with long-term current use of insulin  (*) -  complicated history of chronically uncontrolled diabetes. a1c in the hospital was 9.1. complicated by hypoglyemia and insulin  has been held. Unclear why trulicity  is held. Concern for hyperglycemia without her medications and only on jardiance. Restart trulcity. She wishes to keep her dose and I will give her zofran  in the event of nausea since it has been less than 3 weeks  -  encouraged her to focus on diabetes control - I helped her set up her phone for the libre 3 sensors so she can monitor her glucose -  referral placed to endo and pharmacy to help manage. Has hx of significant complications  -  requesting records from [facility] street  -     ondansetron  (ZOFRAN ) 4 mg tablet; Take one tablet (4 mg dose) by mouth every 8 (eight) hours as needed for Nausea. -     CBC; Future -     Comprehensive Metabolic Panel; Future -     Lipid Panel With LDL/HDL Ratio; Future -     TSH Rfx on Abnormal to Free T4; Future -     Hemoglobin A1c; Future -     Albumin/Creatinine Ratio, Random Urine; Future -     Ambulatory referral to Endocrinology; Future -     Ambulatory Referral to CPP Medication Management; Future  S/P bilateral below knee amputation (*) She would benefit from PT to eval for walker and power wheelchair  -     Ambulatory referral to Physical Therapy Evaluation and Treatment; Future Age-related osteoporosis without current pathological fracture -  on fosamax - requesting records from [facility] [address]  -     Ambulatory Referral to CPP Medication Management; Future  Hyperlipidemia associated with type 2 diabetes mellitus (*) On  atorvastatin   -     Comprehensive Metabolic Panel; Future -     Lipid Panel With LDL/HDL Ratio; Future  Hypertension associated with diabetes (*) Controlled on rx  -     Comprehensive Metabolic Panel; Future  Documentation for time-based billing:  Total time spent of date of service was 85 minutes.  Patient care activities included preparing to see the patient such as reviewing the patient record, obtaining and/or reviewing separately obtained history, performing a medically appropriate history and physical examination, counseling and educating the patient, family, and/or caregiver, ordering prescription medications, tests, or procedures, referring and communicating with other health care providers when not separately reported during the visit, and documenting clinical information in the electronic or other health record.    Follow up in about 3 weeks (around 10/11/2024) for diabetes, referrals .    Patient expresses understanding of their current medications and use.  We discussed potential side effects, drug interactions, instructions for taking the medication, and the consequences of not taking it. Patient verbalized an understanding of these instructions. Patient is able to verbalize understanding of the care plan discussed today. Patient's medical and personal goals were discussed today.  Computer technology was used to create visit note. Consent from patient/care giver was obtained prior to its use.   Patient was instructed to return sooner if no improvement, and we discussed precautions to seek emergency medical care if condition worsens.  SUBJECTIVE:  Ms. Gedeon is a 73 y.o. female that presents to clinic today regarding the following issues: New Patient (Est care. Discuss DM dx) and Urinary Frequency   History of Present Illness The patient is a 73 year old female who presents for evaluation of diabetes management. She is with her daughter.   She was in the hospital due to  hypoglycemia and abnormal CT scan for esophagus from 9/11-9/14. Gi consulted and esophagus was very tortuous on EGD with biopsies done. Currently on amox, clarithromycin, pantoprazole  for H pylor infection.   She has been struggling with uncontrolled type 2 diabetes for years, with her A1c levels consistently around 10 or 11. She does report that at times it has been controlled. Her primary care has been managed at [facility], where she was previously on 40 units of tresiba . She has been using the [employer] system for about a year but has not had a new meter, making it difficult to monitor her blood sugar levels. She has connected it to her phone but would like to.  She does not have a fingerstick meter at home and has difficulty getting enough blood for finger sticks.  The hospital, a1c was 9.1, but they had her hold her insulin  and her trulicity . She is only taking jardiance. Does not know what her glucose is.  Trulicity  did not make her sick. She has felt well since returning home from the hospital.  She has complications of diabetes. She had a right leg amputation 25 years ago and a left leg amputation 5 years ago, both due to diabetes complications. She has previously undergone physical therapy but reports that her legs are becoming weaker. She has not used a home health agency before. She attempted to get a powered wheelchair through her insurance, but it was not approved. She lives in a handicap-accessible apartment. She would like to get a walker and a power wheelchair. She does walk with a cane and her amputations.   She is currently on atorvastatin  for cholesterol management, lisinopril -hctz and amlodipine  for hypertension.  She sees a cardiologist once a year.  She has an annual mammogram, with the last one conducted in 06/2024 at [place]. Her last colonoscopy was 3 years ago per pt.          Reviewed and updated this visit by provider: Tobacco  Allergies  Meds  Problems   Med Hx  Surg Hx  Fam Hx        Medications Patient's Medications  New Prescriptions   ONDANSETRON  (ZOFRAN ) 4 MG TABLET    Take one tablet (4 mg dose) by mouth every 8 (eight) hours as needed for Nausea.  Previous Medications   ACETAMINOPHEN  (TYLENOL ) 500 MG TABLET    Take one tablet (500 mg dose) by mouth every 6 (six) hours as needed.   ALENDRONATE (FOSAMAX) 70 MG TABLET    Take one tablet (70 mg dose) by mouth once a week at 0900.   AMLODIPINE  BESYLATE (NORVASC ) 10 MG TABLET    Take one tablet (10 mg dose) by mouth.   AMOXICILLIN  (AMOXIL ) 500 MG CAPSULE    Take two capsules (1,000 mg dose) by mouth 2 (two) times daily.   APOAEQUORIN (PREVAGEN PO)    Take by mouth.   ATORVASTATIN  (LIPITOR) 20 MG TABLET    Take one tablet (20 mg dose) by mouth daily.   CLARITHROMYCIN (BIAXIN) 500 MG TABLET    Take one tablet (500 mg dose) by mouth 2 (two) times daily.  CONTINUOUS GLUCOSE SENSOR (FREESTYLE LIBRE 3 SENSOR) MISC    Place 1 each onto the skin every 14 (fourteen) days.   INSULIN  DEGLUDEC (TRESIBA ) 100 UNIT/ML INJECTION    Inject thirty Units into the skin daily.   JARDIANCE 25 MG TABS TABLET    Take one tablet (25 mg dose) by mouth daily.   LISINOPRIL -HYDROCHLOROTHIAZIDE  (PRINZIDE ,ZESTORETIC ) 20-12.5 MG PER TABLET    Take one tablet by mouth daily.   MAGNESIUM  250 MG CAPS    Take by mouth.   PANTOPRAZOLE  SODIUM (PROTONIX ) 40 MG TABLET    Take one tablet (40 mg dose) by mouth daily.   TRULICITY  4.5 MG/0.5ML SOAJ INJECTION    Inject 0.5 mLs (4.5 mg dose) into the skin once a week at 0900.  Modified Medications   No medications on file  Discontinued Medications   No medications on file      ROS  Review of Systems  Constitutional: Negative.   HENT: Negative.    Eyes: Negative.   Respiratory: Negative.    Cardiovascular: Negative.   Gastrointestinal: Negative.   Endocrine: Negative.   Genitourinary: Negative.   Musculoskeletal: Negative.   Skin: Negative.    Allergic/Immunologic: Negative.   Neurological:  Positive for weakness.  Hematological: Negative.   Psychiatric/Behavioral: Negative.       Review of Systems - All other systems reviewed are negative except as noted above.   OBJECTIVE:  Vitals BP 122/70 (BP Location: Left Upper Arm, Patient Position: Sitting)   Pulse 79   Temp 97 F (36.1 C) (Temporal)   Ht 5' 7 (1.702 m)   Wt 174 lb (78.9 kg)   SpO2 95%   BMI 27.25 kg/m    PHYSICAL:  General Appearance: Alert, oriented and appears in no acute distress. Sitting comfortably in chair. Head: Normocephalic, without obvious abnormality, atraumatic.  HENT: Oral mucosa moist  Respiratory: Good air movement throughout. No accessory muscle use, increased work of breathing or respiratory distress. Clear to auscultation in all lung fields. Normal effort of breathing, non-labored. No adventitious lung sounds including wheezes, rhonchi or crackles. Cardiovascular: Regular rate and rhythm, S1, S2 normal, no murmur. No heaves, lifts or thrills palpable. Extremities: No peripheral edema.  Skin: Warm, dry.  Psychiatric: Pleasant. Mood and affect normal. Not anxious or tearful. Neurological: AOx3. Gait with cane and blt prosthetics.     Joesph HILARIO Cedar, PA-C  [facility]  09/20/2024 11:35 AM

## 2024-10-18 ENCOUNTER — Other Ambulatory Visit: Payer: Self-pay

## 2024-10-18 ENCOUNTER — Ambulatory Visit: Attending: Physician Assistant

## 2024-10-18 DIAGNOSIS — M6281 Muscle weakness (generalized): Secondary | ICD-10-CM | POA: Diagnosis present

## 2024-10-18 DIAGNOSIS — R2689 Other abnormalities of gait and mobility: Secondary | ICD-10-CM | POA: Insufficient documentation

## 2024-10-18 NOTE — Therapy (Unsigned)
 OUTPATIENT PHYSICAL THERAPY WHEELCHAIR EVALUATION   Patient Name: Diana Coleman MRN: [id number] DOB:1950/12/31, 73 y.o., female Today's Date: 10/18/2024  END OF SESSION:  PT End of Session - 10/18/24 1304     Visit Number 1    Number of Visits 1    PT Start Time 1315    PT Stop Time 1400    PT Time Calculation (min) 45 min    Equipment Utilized During Treatment Gait belt    Activity Tolerance Patient tolerated treatment well    Behavior During Therapy WFL for tasks assessed/performed          Past Medical History:  Diagnosis Date   Cellulitis of foot, left 10/24/2014   hx/notes 10/24/2014   Charcot foot due to diabetes mellitus (HCC)    Hepatitis    Hep B    Hypertension    Lesion of pancreas 09/02/2024   Osteomyelitis of right foot (HCC)    hx/notes 10/24/2014   Proliferative diabetic retinopathy (HCC)    right eye and vitreous hemorrhage   Type II diabetes mellitus (HCC)    Since 1998   Past Surgical History:  Procedure Laterality Date   ABOVE KNEE LEG AMPUTATION Right 2004   AMPUTATION Left 10/29/2014   Procedure: AMPUTATION BELOW KNEE - LEFT;  Surgeon: Jerona Harden GAILS, MD;  Location: [facility] OR;  Service: Orthopedics;  Laterality: Left;   AMPUTATION Right 01/05/2022   Procedure: AMPUTATION TIP OF RIGHT INDEX FINGER;  Surgeon: Josefina Chew, MD;  Location: [facility] OR;  Service: Orthopedics;  Laterality: Right;   CATARACT EXTRACTION W/ INTRAOCULAR LENS  IMPLANT, BILATERAL     ESOPHAGOGASTRODUODENOSCOPY N/A 09/02/2024   Procedure: EGD (ESOPHAGOGASTRODUODENOSCOPY);  Surgeon: Saintclair Jasper, MD;  Location: [facility];  Service: Gastroenterology;  Laterality: N/A;   FOOT SURGERY Left 1980's   ulcer removed   I & D EXTREMITY Right 10/04/2016   Procedure: IRRIGATION AND DEBRIDEMENT MIDDLE FINGER AND AMPUTATION;  Surgeon: Prentice Pagan, MD;  Location: [facility];  Service: Orthopedics;  Laterality: Right;   I & D EXTREMITY Right 01/05/2022   Procedure: IRRIGATION AND DEBRIDEMENT RIGHT  INDEX FINGER;  Surgeon: Josefina Chew, MD;  Location: [facility];  Service: Orthopedics;  Laterality: Right;   MEMBRANE PEEL Right 02/01/2019   Procedure: LIN BOOTY;  Surgeon: Alvia Norleen BIRCH, MD;  Location: [facility] OR;  Service: Ophthalmology;  Laterality: Right;   PARS PLANA VITRECTOMY 27 GAUGE Right 02/01/2019   PARS PLANA VITRECTOMY 27 GAUGE Right 02/01/2019   Procedure: PARS PLANA VITRECTOMY 27 GAUGE, MEMBRANE PEEL, ENDOLASER, GAS INJECTION;  Surgeon: Alvia Norleen BIRCH, MD;  Location: [facility] OR;  Service: Ophthalmology;  Laterality: Right;   REDUCTION MAMMAPLASTY Bilateral 1987   TOE AMPUTATION Left ~ 2011   top of my 3rd toe   TUBAL LIGATION  1973   Patient Active Problem List   Diagnosis Date Noted   Lesion of pancreas 09/02/2024   Esophageal stenosis 09/02/2024   Hypocalcemia 09/02/2024   Uncontrolled type 1 diabetes mellitus with hypoglycemia, with long-term current use of insulin  (HCC) 09/01/2024   Hypoglycemia 09/01/2024   Type 2 diabetes mellitus with hyperlipidemia (HCC) 01/06/2022   Hyperglycemia    Dyslipidemia 10/18/2020   Vitreous hemorrhage of right eye due to diabetes mellitus (HCC) 02/01/2019   Preop cardiovascular exam 10/22/2018   Chest pain 10/22/2018   Vitreous hemorrhage, right eye (HCC) 09/21/2018   Proliferative diabetic retinopathy with macular edema (HCC) 09/21/2018   Non-pressure chronic ulcer of left calf, limited to breakdown of skin (  HCC) 11/12/2016   S/P BKA (below knee amputation) bilateral (HCC) 11/12/2016   Anemia 10/04/2016   Finger osteomyelitis, right (HCC) 10/04/2016   Finger infection 10/04/2016   Osteomyelitis of finger (HCC) 10/04/2016   Gastroparesis 11/02/2014   Diabetes mellitus due to underlying condition with other specified complication (HCC)    Diabetic foot (HCC)    Nausea & vomiting    Hypertension complicating diabetes (HCC) 10/25/2014   GERD (gastroesophageal reflux disease) 10/25/2014   Cellulitis 10/24/2014   Diabetes (HCC)  10/24/2014   Insulin  dependent diabetes mellitus (HCC) 10/24/2014    PCP: Emilio Joesph DEL, PA-C  REFERRING PROVIDER: Emilio Joesph DEL, PA-C  THERAPY DIAG:  Other abnormalities of gait and mobility  Muscle weakness (generalized)  Rationale for Evaluation and Treatment Rehabilitation  SUBJECTIVE:                                                                                                                                                                                           SUBJECTIVE STATEMENT: Pt presents for wheelchair evaluation. Diana Coleman is a 73 y.o. year old female with PMH of hyperlipidemia, diabetes on long-term insulin , hypertension, and bil BKA. Pt had R BKA done 01/05/2022 and L BKA was done 10/29/2014. Patient recently received newer prosthetic legs on bil LE. Pt lives by herself at home. Pt uses [employer] for in home mobility. Pt reports of one fall within last year. Pt reports of multiple near falls per day but able to catch herself at home with walls and furniture. Pt has rolator at home. Pt reports of chronic pain with R shoulder due to rotator cuff issues. Pt also reports of chronic lower back pain with radicular symptoms in R LE that intermittently causes leg to give out. Patient also has distal index and middle finger tips amputated in R hand.   PRECAUTIONS: Fall  RED FLAGS: None  WEIGHT BEARING RESTRICTIONS No    OCCUPATION: disability  PLOF:  Needs assistance with ADLs, Needs assistance with homemaking, and Needs assistance with gait  PATIENT GOALS: obtain power mobility device         MEDICAL HISTORY:  Primary diagnosis onset: 10/06/2024 Date of referral    Medical Diagnosis with ICD-10 code: Z89.512 (ICD-10-CM) - Acquired absence of left leg below knee  Z89.511 (ICD-10-CM) - Acquired absence of right leg below knee     [] Progressive disease  Relevant future surgeries:     Height: 5' 7 Weight: 174 lbs Explain recent changes or trends in  weight:      History:  Past Medical History:  Diagnosis Date   Cellulitis of foot, left 10/24/2014  hx/notes 10/24/2014   Charcot foot due to diabetes mellitus (HCC)    Hepatitis    Hep B    Hypertension    Lesion of pancreas 09/02/2024   Osteomyelitis of right foot (HCC)    hx/notes 10/24/2014   Proliferative diabetic retinopathy (HCC)    right eye and vitreous hemorrhage   Type II diabetes mellitus (HCC)    Since 1998       Cardio Status:  Functional Limitations:   [x] Intact  []  Impaired      Respiratory Status:  Functional Limitations:   [x] Intact  [] Impaired   [] SOB [] COPD [] O2 Dependent ______LPM  [] Ventilator Dependent  Resp equip:                                                     Objective Measure(s):   Orthotics:   [] Amputee:                                                             [x] Prosthesis: Bil BKA       HOME ENVIRONMENT:  [] House [] Condo/town home [x] Apartment [] Asst living [] LTCF         [] Own  [x] Rent   [x] Lives alone [] Lives with others -                             Hours without assistance: 24  [] Home is accessible to patient                                 Storage of wheelchair:  [x] In home   [] Other Comments:        COMMUNITY :  TRANSPORTATION:  [] Car [] Insurance Account Manager [] Adapted w/c Lift []  Ambulance [] Other:                     [] Sits in wheelchair during transport   Where is w/c stored during transport?  [] [place]  []  [employer]  r   [] Self-Driver       Drive while in  Biomedical Scientist [] yes [x] no   Employment and/or school:  Specific requirements pertaining to mobility        Other:  COMMUNICATION:  Verbal Communication  [x] WFL [] receptive [] WFL [] expressive [] Understandable  [] Difficult to understand  [] non-communicative  Primary Language:_____English_________ 2nd:_____________  Communication provided by:[x] Patient [] Family [] Caregiver [] Translator   [] Uses an augmentative communication device     Manufacturer/Model :                                                                 MOBILITY/BALANCE:  Sitting Balance  Standing Balance  Transfers  Ambulation   [x] WFL      [] WFL  [] Independent  []  Independent   [] Uses UE for balance in sitting Comments:  [x] Uses UE/device for stability Comments:  [x]  Min assist  using bil UE, and assistive device, and bil prosthetic legs []  Ambulates independently with       device:___________________      []  Mod assist  [x]  Able to ambulate __115'____ feet        safely/functionally/independently   []  Min assist  []  Min assist  []  Max assist  []  Non-functional ambulator         History/High risk of falls   []  Mod assist  []  Mod assist  []  Dependent  []  Unable to ambulate   []  Max  assist  []  Max assist  Transfer method:[] 1 person [] 2 person [] sliding board [] squat pivot [] stand pivot [] mechanical patient lift  [] other:   []  Unable  []  Unable    Fall History: # of falls in the past 6 months? 0 ( 1 fall in last year) # of "near" falls in the past 6 months? Multiple     CURRENT SEATING / MOBILITY:  Current Mobility Device: [] None [x] Cane/Walker [] Manual [] Dependent [] Dependent w/ Tilt rScooter  [] Power (type of control):   Manufacturer:  Model:  Serial #:   Size:  Color:  Age:   Purchased by whom:   Current condition of mobility base:    Current seating system:                                                                       Age of seating system:    Describe posture in present seating system:    Is the current mobility meeting medical necessity?:  [] Yes [x] No Describe: Pt reports of multiple near falls with use of cane or walker, pt reports of increase fatigue with standing/walking with bil prosthetic legs.                                    Ability to complete Mobility-Related Activities of Daily Living (MRADL's) with Current Mobility Device:   Move room to room  [] Independent  [x] Min [] Mod [] Max assist  [] Unable  Comments: Pt needs AD to move from room to room but  at risk for fall with current AD, has caregiver that helps with cooking, cleaning and bathing.  Meal prep  [] Independent  [] Min [] Mod [x] Max assist  [] Unable    Feeding  [x] Independent  [] Min [] Mod [] Max assist  [] Unable    Bathing  [] Independent  [] Min [x] Mod [] Max assist  [] Unable    Grooming  [x] Independent  [] Min [] Mod [] Max assist  [] Unable    UE dressing  [x] Independent  [] Min [] Mod [] Max assist  [] Unable    LE dressing  [x] Independent   [] Min [] Mod [] Max assist  [] Unable    Toileting  [x] Independent  [] Min [] Mod [] Max assist  [] Unable    Bowel Mgt: [x]  Continent []  Incontinent []  Accidents []  Diapers []  Colostomy []  Bowel Program:  Bladder Mgt: [x]  Continent []  Incontinent []  Accidents []  Diapers []  Urinal []  Intermittent Cath []  Indwelling Cath []  Supra-pubic Cath     Current Mobility Equipment Trialed/ Ruled Out:    Does not meet mobility needs due to:    Oneil all boxes that indicate inability to use the specific equipment listed     Meets needs for safe  independent functional  ambulation  / mobility    Risk of  Falling or History of Falls    Enviromental limitations      Cognition    Safety concerns with  physical ability    Decreased / limitations endurance  & strength     Decreased / limitations  motor skills  & coordination    Pain    Pace /  Speed    Cardiac and/or  respiratory condition    Contra - indicated by diagnosis   Cane/Crutches  []   [x]   []   []   [x]   [x]   [x]   [x]   [x]   []   []    Walker / Rollator  []  NA   []   [x]   []   []   [x]   [x]   [x]   [x]   [x]   []   []     Manual Wheelchair [id number]:  []  NA  []   []   []   []   []   []   []   []   []   []   []    Manual W/C (K0005) with power assist  []  NA  []   []   []   []   []   []   []   []   []   []   []    Scooter  []  NA  []   []   []   []   []   []   []   []   []   []   []    Power Wheelchair: standard joystick  []  NA  [x]   []   []   []   []   []   []   []   []   []   []    Power Wheelchair: alternative controls  []  NA  []   []   []   []   []   []    []   []   []   []   []    Summary:  The least costly alternative for independent functional mobility was found to be:    []  Crutch/Cane  []  Walker []  Manual w/c  []  Manual w/c with power assist   []  Scooter   [x]  Power w/c std joystick   []  Power w/c alternative control        []  Requires dependent care mobility device   Cabin Crew for [employer] skills are adequate for safe mobility equipment operation  [x]   Yes []   No  Patient is willing and motivated to use recommended mobility equipment  [x]   Yes []   No       []  Patient is unable to safely operate mobility equipment independently and requires dependent care equipment Comments:           SENSATION and SKIN ISSUES:  Sensation [x]  Intact  []  Impaired []  Absent []  Hyposensate []  Hypersensate  []  Defensiveness  Location(s) of impairment:    Pressure Relief Method(s):  [x]  Lean side to side to offload (without risk of falling)  []   W/C push up (4+ times/hour for 15+ seconds) [x]  Stand up (without risk of falling)    []  Other: (Describe): Effective pressure relief method(s) above can be performed consistently throughout the day: [x] Yes  []  No If not, Why?:  Skin Integrity Risk:       [x]  Low risk           []  Moderate risk            []  High risk  If high risk, explain:   Skin Issues/Skin Integrity  Current skin Issues  []  Yes [x]  No [x]  Intact  []   Red area   []   Open area  []  Scar tissue  []   At risk from prolonged sitting  Where: History of Skin Issues  []  Yes [x]  No Where : When: Stage: Hx of skin flap surgeries  []  Yes [x]  No Where:  When:  Pain: [x]  Yes []  No   Pain Location(s): R shoulder, back pain Intensity scale: (0-10) :8/10 (shoulder and back) How does pain interfere with mobility and/or MRADLs? - difficulty with mobility using RW or cane, difficulty with OH reach, difficulty with transfers, back pain prevents with prolonged standing/walking, pt reports of intermittent leg giving away  (R LE) from radicular pain        MAT EVALUATION:  Neuro-Muscular Status: (Tone, Reflexive, Responses, etc.)     [x]   Intact   []  Spasticity:  []  Hypotonicity  []  Fluctuating  []  Muscle Spasms  []  Poor Righting Reactions/Poor Equilibrium Reactions  []  Primal Reflex(s):    Comments:            COMMENTS:    POSTURE:     Comments:  Pelvis Anterior/Posterior:  [x]  Neutral   []  Posterior  []  Anterior  []  Fixed - No movement []  Tendency away from neutral []  Flexible []  Self-correction []  External correction Obliquity (viewed from front)  [x]  WFL []  R Obliquity []  L Obliquity  []  Fixed - No movement []  Tendency away from neutral []  Flexible []  Self-correction []  External correction Rotation  [x]  WFL []  R anterior []  L anterior  []  Fixed - No movement []  Tendency away from neutral []  Flexible []  Self-correction []  External correction Tonal Influence Pelvis:  [x]  Normal []  Flaccid []  Low tone []  Spasticity []  Dystonia []  Pelvis thrust []  Other:    Trunk Anterior/Posterior:  [x]  WFL []  Thoracic kyphosis []  Lumbar lordosis  []  Fixed - No movement []  Tendency away from neutral []  Flexible []  Self-correction []  External correction  [x]  WFL []  Convex to left  []  Convex to right []  S-curve   []  C-curve []  Multiple curves []  Tendency away from neutral []  Flexible []  Self-correction []  External correction Rotation of shoulders and upper trunk:  [x]  Neutral []  Left-anterior []  Right- anterior []  Fixed- no movement []  Tendency away from neutral []  Flexible []  Self correction []  External correction Tonal influence Trunk:  [x]  Normal []  Flaccid []  Low tone []  Spasticity []  Dystonia []  Other:   Head & Neck  [x]  Functional []  Flexed    []  Extended []  Rotated right  []  Rotated left []  Laterally flexed right []  Laterally flexed left []  Cervical hyperextension   [x]  Good head control []  Adequate head control []  Limited head  control []  Absent head control Describe tone/movement of head and neck:      Lower Extremity Measurements: LE ROM:  Active ROM Right 10/18/2024 Left 10/18/2024  Hip flexion    Hip extension    Hip abduction    Hip adduction    Knee flexion    Knee extension    Ankle dorsiflexion    Ankle plantarflexion     (Blank rows = not tested)  LE MMT:  MMT Right 10/18/2024 Left 10/18/2024  Hip flexion 4/5 3+/5  Hip extension    Hip abduction 4/5 3+/5  Hip adduction    Knee flexion 4/5 3+/5  Knee extension 4/5 4/5  Ankle dorsiflexion    Ankle plantarflexion     (Blank rows = not tested)  Hip positions:  [x]  Neutral   []  Abducted   []  Adducted  []  Subluxed   []  Dislocated   []  Fixed   []  Tendency away  from neutral []  Flexible []  Self-correction []  External correction   Hip Windswept:[x]  Neutral  []  Right    []  Left  []  Subluxed   []  Dislocated   []  Fixed   []  Tendency away from neutral []  Flexible []  Self-correction []  External correction  LE Tone: [x]  Normal []  Low tone []  Spasticity []  Flaccid []  Dystonia []  Rocks/Extends at hip []  Thrust into knee extension []  Pushes legs downward into footrest  Foot positioning: ROM Concerns: Dorsiflexed: []  Right   []  Left Plantar flexed: []  Right    []  Left Inversion: []  Right    []  Left Eversion: []  Right    []  Left  LE Edema: [x]  1+ (Barely detectable impression when finger is pressed into skin) []  2+ (slight indentation. 15 seconds to rebound) []  3+ (deeper indentation. 30 seconds to rebound) []  4+ (>30 seconds to rebound)  UE Measurements:  UPPER EXTREMITY ROM:   Active ROM Right 10/18/2024 Left 10/18/2024  Shoulder flexion 110 pain 120  Shoulder abduction    Shoulder adduction    Elbow flexion    Elbow extension    Wrist flexion    Wrist extension    (Blank rows = not tested)  UPPER EXTREMITY MMT:  MMT Right 10/18/2024 Left 10/18/2024  Shoulder flexion 3+/5 pain 4/5  Shoulder abduction 3+/5  pain 4/5  Shoulder adduction    Elbow flexion 5/5 5/5  Elbow extension 5/5 5/5  Wrist flexion    Wrist extension    Pinch strength    Grip strength 12.5 lbs 20 lbs  (Blank rows = not tested)  Shoulder Posture:  Right Tendency towards Left  [x]   Functional [x]    []   Elevation []    []   Depression []    []   Protraction []    []   Retraction []    []   Internal rotation []    []   External rotation []    []   Subluxed []     UE Tone: [x]  Normal []  Flaccid []  Low tone []  Spasticity  []  Dystonia []  Other:   UE Edema: [x]  1+ (Barely detectable impression when finger is pressed into skin) []  2+ (slight indentation. 15 seconds to rebound) []  3+ (deeper indentation. 30 seconds to rebound) []  4+ (>30 seconds to rebound)  Wrist/Hand: Handedness: [x]  Right   []  Left   []  NA: Comments:  Right  Left  []   WNL []    []   Limitations []    []   Contractures []    []   Fisting []    []   Tremors []    [x]   Weak grasp [x]    []   Poor dexterity []    []   Hand movement non functional []    []   Paralysis []         MOBILITY BASE RECOMMENDATIONS and JUSTIFICATION:  MOBILITY BASE  JUSTIFICATION   Manufacturer:   Games Developer:           [employer]                   Color:  Seat Width:  18 Seat Depth 20   []  Manual mobility base (continue below)   []  Scooter/POV  [x]  Power mobility base   Number of hours per day spent in above selected mobility base: 6+  Typical daily mobility base use Schedule: during the day intermittently to manage faigue and improve indpendendence   [x]  is not a safe, functional ambulator  [x]  limitation prevents from completing a MRADL(s) within a reasonable time frame    [x]  limitation places at  high risk of morbidity or mortality secondary to  the attempts to perform a    MRADL(s)  []  limitation prevents accomplishing a MRADL(s) entirely  [x]  provide independent mobility  [x]  equipment is a lifetime medical need  [x]  walker or cane inadequate  [x]  any type manual wheelchair       inadequate  [x]  scooter/POV inadequate      []  requires dependent mobility          MANUAL MOBILITY      []  Standard manual wheelchair  K0001      Arm:    []  both []  right  []  left      Foot:   []  both []  right   []  left  []  self-propels wheelchair  []  will use on regular basis  []  chair fits throughout home  []  willing and motivated to use  []  propels with assistance     []  dependent use   []  Standard hemi-manual wheelchair  K0002      Arm:    []  both []  right  []  left      Foot:   []  both []  right   []  left  []  lower seat height required to foot propel  []  short stature  []  self-propels wheelchair  []  will use on regular basis  []  chair fits throughout home  []  willing and motivated to use   []  propels with assistance  []  dependent use   []  Lightweight manual wheelchair  K0003      Arm:    []  both []  right  []  left      Foot:   []  both  []  right  []  left                   []  hemi height required  []  medical condition and weight of  wheelchair affect ability to self      propel standard manual wheelchair in the residence  []  can and does self-propel (marginal propulsion skills)  []  daily use _________hours  []  chair fits throughout home  []  willing and motivated to use  []  lower seat height required to foot propel  []  short stature   []  High strength lightweight manual  wheelchair (Breezy Ultra 4)  K0004     Arm:    []  both []  right  []  left     Foot:   []  both []  right   []  left                                                                  []  hemi height required []  medical condition and weight of wheelchair affect ability to self propel while engaging in frequent MRADL(s) that cannot be performed in a standard or lightweight manual wheelchair  []  daily use _________hours  []  chair fits throughout home  []  willing and motivated to use  []  prevent repetitive use injuries   []  lower seat height required to foot propel  []  short stature    []   Ultra-lightweight manual wheelchair  K0005     Arm:    []  both []  right  []  left     Foot:   []  both []  right  []  left       []   hemi height required  []  heavy duty    Front seat to floor _____ inches      Rear seat to floor _____ inches      Back height _____ inches     Back angle ______ degrees      Front angle _____ degrees  []   full-time manual wheelchair user  []  Requires individualized fitting and optimal adjustments for multiple features that include adjustable axle configuration, fully adjustable center of gravity, wheel camber, seat and back angle, angle of seat slope, which cannot be accommodated by a K0001 through K0004 manual wheelchair  []  prevent repetitive use injuries  []  daily use_________hours   []  user has high activity patterns that frequently require  them  to go out into the community for the purpose of independently accomplishing high level MRADL activities. Examples of these might include a combination of; shopping, work, school, photographer, childcare, independently loading and unloading from a vehicle etc.  []  lower seat height required to foot propel  []  short stature  []  heavy duty -  weight over 250lbs   []  Current chair is a K0005   manufacture:___________________  model:_________________  serial#____________________  age:_________    []  First time X9994 user (complete trial)  K0004 time and # of strokes to propel 30 feet: ________seconds _________strokes  X9994 time and # of strokes to propel 30 feet: ________seconds _________strokes  What was the result of the trial between the K0004 and K0005 manual wheelchair? ___    What features of the K0005 w/c are needed as compared to the K0004 base? Why?___    []  adjustable seat and back angle changes the angle of seat slope of the frame to attain a gravity assisted position for efficient propulsion and proper weight distribution along the frame     []  the front of the wheelchair will be configured higher  than the back of the chair to allow gravity to assist the user with postural stability  []  the center of the wheel will be positioned for stability, safety and efficient propulsion  []  adjustable axle allows for vertical, horizontal, camber and overall width changes  throughout the wheels for adjustment of the client's exact needs and abilities.   []  adjustable axle increases the stability and function of the chair allowing for adjustment of the center of gravity.   []  accommodates the client's anatomical position in the chair maximizing independence in mobility and maneuverability in all environments.   []  create a minimal fixed tilt-in space to assist in positioning.   []  Describe users full-time manual wheelchair activity patterns:___    []  Power assist Comments:  []  prevent repetitive use injuries  []  repetitive strain injury present in    shoulder girdle    []  shoulder pain is (> or =) to 7/10     during manual propulsion       Current Pain _____/10  []  requires conservation of energy to participate in MRADL(s) runable to propel up ramps or curbs using manual wheelchair  []  been K0005 user greater than one year  []  user unwilling to use power      wheelchair (reason): []  less expensive option to power   wheelchair   []  rim activated power assist -      decreased strength   []  Heavy duty manual wheelchair       K0006     Arm:    []  both []  right  []  left     Foot:   []   both []  right  []  left     []  hemi height required    []  Dependent base  []  user exceeds 250lbs  []  non-functional ambulator    []  extreme spasticity  []  over active movement   []  broken frame/hx of repeated     repairs  []  able to self-propel in residence       []  lower seat to floor height required  []  unable to self-propel in residence   []  Extra heavy duty manual wheelchair  K0007     Arm:    []  both []  right  []  left     Foot:   []  both []  right  []  left     []  hemi height required  []  Dependent base  []   user exceeds 300lbs  []  non-functional ambulator    []  able to self-propel in residence   []  lower seat to floor height required  []  unable to self-propel in residence     []  Manual wheelchair with tilt [phone]      (Manual "Tilt-n-Space")  []  patient is dependent for transfers  []  patient requires frequent       positioning for pressure relief   []  patient requires frequent      positioning for poor/absent trunk control        []  Stroller Base  []  infant/child   []  unable to propel manual      wheelchair  []  allows for growth  []  non-functional ambulator  []  non-functional UE  []  independent mobility is not a goal at this time    MANUAL FRAME OPTIONS      Push handles  []  extended   []  angle adjustable   []  standard  []  caregiver access  []  caregiver assist    []  allows "hooking" to enable      increased ability to perform ADLs or maintain balance   []  Angle Adjustable Back  []  postural control  []  control of tone/spasticity  []  accommodation of range of motion  []  UE functional control  []  accommodation for seating system    Rear wheel placement  []  std/fixed  [] fully adjustableramputee   []  camber ________degree  []  removable rear wheel  []  non-removable rear wheel  Wheel size _______  Wheel style_______________________  []  improved UE access to wheels  []  increase propulsion ability  []  improved stability  []  changing angle in space for      improvement of postural stability  []  remove for transport    []  allow for seating system to fit on  base  []  amputee placement  []  1-arm drive access   r R  r L  []  enable propulsion of manual       wheelchair with one arm    []  amputee placement   Wheel rims/ Hand rims  []  Standard    []  Specialized-____ []  provide ability to propel manual   []  increase self-propulsion with hand wheelchair weakness/decreased grasp     []  Spoke protector/guard   []  prevent hands from getting caught in spokes   Tires:  []  pneumatic  []  flat  free inserts  []  solid  Style:  []  decrease roll resistance              []  prevent frequent flats  []  increase shock absorbency  []  decrease maintenance   []  decrease pain from road shock    []  decrease spasms from road shock    Wheel Locks:    []   push []  pull []  scissor  []  lock wheels for transfers  []  lock wheels from rolling   Brake/wheel lock extension:  []  R  []  L  []  allow user to operate wheel locks due to decreased reach or strength   Caster housing:  Caster size:                      Style:                                          []  suspension fork  []  maneuverability   []  stability of wheelchair   []  durability  []  maintenance  []  angle adjustment for posture  []  allow for feet to come under        wheelchair base  []  allows change in seat to floor  height   []  increase shock absorbency  []  decrease pain from road shock  []  decrease spasms from road    shock   []  Side guards  []  prevent clothing getting caught in wheel or becoming soiled   [] provide hip and pelvic stability  []  eliminates contact between body and wheels  []  limit hand contact with wheels   []  Anti-tippers      []  prevent wheelchair from tipping    backward  []  assist caregiver with curbs     POWER MOBILITY      []  Scooter/POV    []  can safely operate   []  can safely transfer   []  has adequate trunk stability   []  cannot functionally propel  manual wheelchair    [x]  Power mobility base    []  non-ambulatory   [x]  cannot functionally propel manual wheelchair   [x]  cannot functionally and safely      operate scooter/POV  [x]  can safely operate power       wheelchair  [x]  home is accessible  [x]  willing to use power wheelchair     Tilt  []  Powered tilt on powered chair  []  Powered tilt on manual chair  []  Manual tilt on manual chair Comments:  []  change position for pressure      []  elief/cannot weight shift   []  change position against      gravitational force on head and      shoulders    []  decrease pain  []  blood pressure management   []  control autonomic dysreflexia  []  decrease respiratory distress  []  management of spasticity  []  management of low tone  []  facilitate postural control   []  rest periods   []  control edema  []  increase sitting tolerance   []  aid with transfers     Recline   []  Power recline on power chair  []  Manual recline on manual chair  Comments:    []  intermittent catheterization  []  manage spasticity  []  accommodate femur to back angle  []  change position for pressure relief/cannot weight shift rhigh risk of pressure sore development  []  tilt alone does not accomplish     effective pressure relief, maximum pressure relief achieved at -      _______ degrees tilt   _______ degrees recline   []  difficult to transfer to and from bed []  rest periods and sleeping in chair  []  repositioning for transfers  []  bring to full recline for ADL care  []  clothing/diaper changes in chair  []  gravity  PEG tube feeding  []  head positioning  []  decrease pain  []  blood pressure management   []  control autonomic dysreflexia  []  decrease respiratory distress  []  user on ventilator     Elevator on mobility base  []  Power wheelchair  []  Scooter  []  increase Indep in transfers   []  increase Indep in ADLs    []  bathroom function and safety  []  kitchen/cooking function and safety  []  shopping  []  raise height for communication at standing level  []  raise height for eye contact which reduces cervical neck strain and pain  []  drive at raised height for safety and navigating crowds  []  Other:   []  Vertical position system  (anterior tilt)     (Drive locks-out)    []  Stand       (Drive enabled)  []  independent weight bearing  []  decrease joint contractures  []  decrease/manage spasticity  []  decrease/manage spasms  []  pressure distribution away from   scapula, sacrum, coccyx, and ischial tuberosity  []  increase digestion and elimination   []  access to  counters and cabinets  []  increase reach  []  increase interaction with others at eye level, reduces neck strain  []  increase performance of       MRADL(s)      Power elevating legrest    []  Center mount (Single) 85-170 degrees       []  Standard (Pair) 100-170 degrees  []  position legs at 90 degrees, not available with std power ELR  []  center mount tucks into chair to decrease turning radius in home, not available with std power ELR  []  provide change in position for LE  []  elevate legs during recline    []  maintain placement of feet on      footplate  []  decrease edema  []  improve circulation  []  actuator needed to elevate legrest  []  actuator needed to articulate legrest preventing knees from flexing  []  Increase ground clearance over      curbs  []   STD (pair) independently                     elevate legrest   POWER WHEELCHAIR CONTROLS      Controls/input device  []  Expandable  [x]  Non-expandable  [x]  Proportional  [x]  Right Hand []  Left Hand  []  Non-proportional/switches/head-array  []  Electrical/proximity         []   Mechanical      Manufacturer:___________________   Type:________________________ [x]  provides access for controlling wheelchair  [x]  programming for accurate control  []  progressive disease/changing condition  []  required for alternative drive      controls       []  lacks motor control to operate  proportional drive control  []  unable to understand proportional controls  []  limited movement/strength  []  extraneous movement / tremors / ataxic / spastic       []  Upgraded electronics controller/harness    []  Single power (tilt or recline)   []  Expandable    []  Non-expandable plus   []  Multi-power (tilt, recline, power legrest, power seat lift, vertical positioning system, stand)  []  allows input device to communicate with drive motors  []  harness provides necessary connections between the controller, input device, and seat functions     []  needed in  order to operate power seat functions through joystick/ input device  []  required for alternative drive controls     []  Enhanced display  []  required to connect all alternative drive  controls   []  required for upgraded joystick      (lite-throw, heavy duty, micro)  []  Allows user to see in which mode and drive the wheelchair is set; necessary for alternate controls       []  Upgraded tracking electronics  []  correct tracking when on uneven surfaces makes switch driving more efficient and less fatiguing  []  increase safety when driving  []  increase ability to traverse thresholds    []  Safety / reset / mode switches     Type:    []  Used to change modes and stop the wheelchair when driving     [x]  [facility] for joystick / input device/switches  [x]  swing away for access or transfers   [x]  attaches joystick / input device / switches to wheelchair   [x]  provides for consistent access  [x]  midline for optimal placement    []  Attendant controlled joystick plus     mount  []  safety  []  long distance driving  []  operation of seat functions  []  compliance with transportation regulations    [x]  Battery U1 x 2 [x]  required to power (power assist / scooter/ power wc / other):   []  Power inverter (24V to 12V)  []  required for ventilator / respiratory equipment / other:     CHAIR OPTIONS MANUAL & POWER      Armrests   [x]  adjustable height []  removable  []  swing away []  fixed  [x]  flip back  []  reclining  [x]  full length pads []  desk []  tube arms []  gel pads  [x]  provide support with elbow at 90    [x]  remove/flip back/swing away for  transfers  []  provide support and positioning of upper body    []  allow to come closer to table top  []  remove for access to tables  []  provide support for w/c tray  []  change of height/angles for variable activities   []  Elbow support / Elbow stop  []  keep elbow positioned on arm pad  []  keep arms from falling off arm pad  during tilt and/or recline   Upper Extremity  Support  []  Arm trough  []   R  []   L  Style:  []  swivel mount []  fixed mount   []  posterior hand support  []   tray  []  full tray  []  joystick cut out  []   R  []   L  Style:  []  decrease gravitational pull on      shoulders  []  provide support to increase UE  function  []  provide hand support in natural    position  []  position flaccid UE  []  decrease subluxation    []  decrease edema       []  manage spasticity   []  provide midline positioning  []  provide work surface  []  placement for AAC/ Computer/ EADL       Hangers/ Legrests   []  ______ degree  []  Elevating []  articulating  []  swing away []  fixed []  lift off  []  heavy duty  []  adjustable knee angle  []  adjustable calf panel   []  longer extension tube              []  provide LE support  []  maintain placement of feet on      footplate   []  accommodate lower leg length  []  accommodate to hamstring       tightness  []  enable transfers  []  provide change in position for LE's  []  elevate legs during  recline    []  decrease edema  []  durability      Foot support   []  footplate []  R []  L []  flip up           []  Depth adjustable   []  angle adjustable  []  foot board/one piece    []  provide foot support  []  accommodate to ankle ROM  []  allow foot to go under wheelchair base  []  enable transfers     []  Shoe holders  []  position foot    []  decrease / manage spasticity  []  control position of LE  []  stability    []  safety     []  Ankle strap/heel      loops  []  support foot on foot support  []  decrease extraneous movement  []  provide input to heel   []  protect foot     []  Amputee adapter []  R  []  L     Style:                  Size:  []  Provide support for stump/residual extremity    []  Transportation tie-down  []  to provide crash tested tie-down brackets    []  Crutch/cane holder    []  O2 holder    []  IV hanger   []  Ventilator tray/mount    []  stabilize accessory on wheelchair       Component  Justification     []  Seat  cushion      []  accommodate impaired sensation  []  decubitus ulcers present or history  []  unable to shift weight  []  increase pressure distribution  []  prevent pelvic extension  []  custom required "off-the-shelf"    seat cushion will not accommodate deformity  []  stabilize/promote pelvis alignment  []  stabilize/promote femur alignment  []  accommodate obliquity  []  accommodate multiple deformity  []  incontinent/accidents  []  low maintenance     []  seat mounts                 []  fixed []  removable  []  attach seat platform/cushion to wheelchair frame    []  Seat wedge    []  provide increased aggressiveness of seat shape to decrease sliding  down in the seat  []  accommodate ROM        []  Cover replacement   []  protect back or seat cushion  []  incontinent/accidents    []  Solid seat / insert    []  support cushion to prevent      hammocking  []  allows attachment of cushion to mobility base    []  Lateral pelvic/thigh/hip     support (Guides)     []  decrease abduction  []  accommodate pelvis  []  position upper legs  []  accommodate spasticity  []  removable for transfers     []  Lateral pelvic/thigh      supports mounts  []  fixed   []  swing-away   []  removable  []  mounts lateral pelvic/thigh supports     []  mounts lateral pelvic/thigh supports swing-away or removable for transfers    []  Medial thigh support (Pommel)  [] decrease adduction  [] accommodate ROM  []  remove for transfers   []  alignment      []  Medial thigh   []  fixed      support mounts      []  swing-away   []  removable  []  mounts medial thigh supports   []  Mounts medial supports swing- away or removable for transfers       Component  Justification   []   Back       []  provide posterior trunk support []  facilitate tone  []  provide lumbar/sacral support []  accommodate deformity  []  support trunk in midline   []  custom required "off-the-shelf" back support will not accommodate deformity   []  provide lateral trunk support []   accommodate or decrease tone            []  Back mounts  []  fixed  []  removable  []  attach back rest/cushion to wheelchair frame   []  Lateral trunk      supports  []  R []  L  []  decrease lateral trunk leaning  []  accommodate asymmetry    []  contour for increased contact  []  safety    []  control of tone    []  Lateral trunk      supports mounts  []  fixed  []  swing-away   []  removable  []  mounts lateral trunk supports     []  Mounts lateral trunk supports swing-away or removable for transfers   []  Anterior chest      strap, vest     []  decrease forward movement of shoulder  []  decrease forward movement of trunk  []  safety/stability  []  added abdominal support  []  trunk alignment  []  assistance with shoulder control   []  decrease shoulder elevation    []  Headrest      []  provide posterior head support  []  provide posterior neck support  []  provide lateral head support  []  provide anterior head support  []  support during tilt and recline  []  improve feeding     []  improve respiration  []  placement of switches  []  safety    []  accommodate ROM   []  accommodate tone  []  improve visual orientation   []  Headrest           []  fixed []  removable []  flip down      Mounting hardware   []  swing-away laterals/switches  []  mount headrest   []  mounts headrest flip down or  removable for transfers  []  mount headrest swing-away laterals   []  mount switches     []  Neck Support    []  decrease neck rotation  []  decrease forward neck flexion   Pelvic Positioner    [x]  std hip belt          []  padded hip belt  []  dual pull hip belt  []  four point hip belt  []  stabilize tone  [x]  decrease falling out of chair  []  prevent excessive extension  []  special pull angle to control      rotation  []  pad for protection over boney   prominence  []  promote comfort    []  Essential needs        bag/pouch   []  medicines []  special food rorthotics []  clothing changes  []  diapers  []  catheter/hygiene []   ostomy supplies   The above equipment has a life- long use expectancy.  Growth and changes in medical and/or functional conditions would be the exceptions.   SUMMARY:    ASSESSMENT: Functional tests: 10 meter walk test: 0.54 m/s (low fall risk, <0.50 indicates high fall risk) Timed up and Go test: 23 sec with st. Cane (<12 seconds indicates high fall risk) CLINICAL IMPRESSION: Patient is a 73 y.o. female who was seen today for physical therapy evaluation and treatment for power mobility assessment. 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 Based on objective findings with 10 meter walk test and Timed up and Go test, patient is at a higher risk for fall. Patient is currently able to ambulate short distances with cane/walker and bil prosthesis but reports of excessive fatigue which limits her function at home. Patient also has chronic shoulder pain and limited ROM and strength in R shoulder which limits her from using Cane/walker or manual wheelchair for extensive periods of time due to pain and fatigue. The least expensive device that would help improve patient's function and independence will be group 2 power chair with captain's seat.    OBJECTIVE IMPAIRMENTS Abnormal gait, decreased activity tolerance, decreased balance, decreased endurance, decreased mobility, difficulty walking, decreased ROM, decreased strength, hypomobility, increased muscle spasms, impaired flexibility, impaired UE functional use, postural dysfunction, prosthetic dependency , and pain.   ACTIVITY LIMITATIONS carrying, lifting, standing, squatting, stairs, transfers, bathing, toileting, and dressing  PARTICIPATION LIMITATIONS: meal prep, cleaning, laundry, driving, shopping, and community activity  PERSONAL FACTORS Age, Time since onset of injury/illness/exacerbation, and 1-2  comorbidities: bil BKA, HTN, DM are also affecting patient's functional outcome.   REHAB POTENTIAL: Good  CLINICAL DECISION MAKING: Stable/uncomplicated  EVALUATION COMPLEXITY: Moderate                                   GOALS: One time visit. No goals established.    PLAN: PT FREQUENCY: one time visit    Raj LOISE Blanch, PT 10/18/2024, 1:05 PM    I concur with the above findings and recommendations of the therapist:  Physician name printed:         Physician's signature:      Date:

## 2024-10-20 ENCOUNTER — Ambulatory Visit

## 2024-10-24 ENCOUNTER — Encounter: Payer: Self-pay | Admitting: Radiology

## 2024-10-26 ENCOUNTER — Encounter: Payer: Self-pay | Admitting: Physical Therapy

## 2024-10-26 ENCOUNTER — Ambulatory Visit: Admitting: Physical Therapy

## 2024-10-26 DIAGNOSIS — R2689 Other abnormalities of gait and mobility: Secondary | ICD-10-CM | POA: Insufficient documentation

## 2024-10-26 DIAGNOSIS — M6281 Muscle weakness (generalized): Secondary | ICD-10-CM | POA: Insufficient documentation

## 2024-10-26 DIAGNOSIS — R2681 Unsteadiness on feet: Secondary | ICD-10-CM

## 2024-10-26 NOTE — Therapy (Signed)
 [facility] [name] [place], [place], [address] Phone: [phone]   Fax:  [phone]  Patient Details  Name: Diana Coleman MRN: [id number] Date of Birth: 1951/01/08 Referring Provider:  Emilio Joesph DEL, PA-C  Encounter Date: 10/26/2024   PT End of Session - 10/26/24 1450     Visit Number 1    Authorization Type [employer] Dual complete    PT Start Time 1448    PT Stop Time 1454    PT Time Calculation (min) 6 min    Behavior During Therapy [facility] for tasks assessed/performed         Pt presents w/hurrycane. Pt reports confusion regarding PT appointment today, as she is also scheduled for PT eval at [facility]. Informed pt she cannot receive PT at both places for the same diagnosis and pt reports she would rather get PT at [facility] as her PCP is with [facility], so did not perform eval.   Marlon BRAVO Sequoyah Counterman, PT, DPT 10/26/2024, 3:05 PM  [facility] [address] [place], [place], [address] Phone: [phone]   Fax:  [phone]

## 2025-03-16 ENCOUNTER — Encounter (INDEPENDENT_AMBULATORY_CARE_PROVIDER_SITE_OTHER): Admitting: Ophthalmology
